# Patient Record
Sex: Male | Born: 1945 | Race: White | Hispanic: No
Health system: Northeastern US, Academic
[De-identification: ages and names within clinical notes are randomized; demographics above are authoritative.]

---

## 2019-05-29 ENCOUNTER — Inpatient Hospital Stay: Admit: 2019-05-29 | Discharge: 2019-05-29 | Payer: BLUE CROSS/BLUE SHIELD

## 2019-05-29 ENCOUNTER — Encounter: Admit: 2019-05-29 | Payer: PRIVATE HEALTH INSURANCE

## 2019-05-29 ENCOUNTER — Ambulatory Visit: Admit: 2019-05-29 | Payer: BLUE CROSS/BLUE SHIELD

## 2019-05-29 DIAGNOSIS — E7800 Pure hypercholesterolemia, unspecified: Secondary | ICD-10-CM

## 2019-05-29 DIAGNOSIS — E1142 Type 2 diabetes mellitus with diabetic polyneuropathy: Secondary | ICD-10-CM

## 2019-05-29 DIAGNOSIS — Z8051 Family history of malignant neoplasm of kidney: Secondary | ICD-10-CM

## 2019-05-29 DIAGNOSIS — C773 Secondary and unspecified malignant neoplasm of axilla and upper limb lymph nodes: Secondary | ICD-10-CM

## 2019-05-29 DIAGNOSIS — Z807 Family history of other malignant neoplasms of lymphoid, hematopoietic and related tissues: Secondary | ICD-10-CM

## 2019-05-29 DIAGNOSIS — Z7984 Long term (current) use of oral hypoglycemic drugs: Secondary | ICD-10-CM

## 2019-05-29 DIAGNOSIS — Z8052 Family history of malignant neoplasm of bladder: Secondary | ICD-10-CM

## 2019-05-29 DIAGNOSIS — Z20822 Contact with and (suspected) exposure to covid-19: Secondary | ICD-10-CM

## 2019-05-29 DIAGNOSIS — E8881 Metabolic syndrome: Secondary | ICD-10-CM

## 2019-05-29 DIAGNOSIS — Z79899 Other long term (current) drug therapy: Secondary | ICD-10-CM

## 2019-05-29 DIAGNOSIS — C4359 Malignant melanoma of other part of trunk: Secondary | ICD-10-CM

## 2019-05-29 DIAGNOSIS — I890 Lymphedema, not elsewhere classified: Secondary | ICD-10-CM

## 2019-05-29 DIAGNOSIS — Z5112 Encounter for antineoplastic immunotherapy: Secondary | ICD-10-CM

## 2019-05-29 DIAGNOSIS — Z7982 Long term (current) use of aspirin: Secondary | ICD-10-CM

## 2019-05-29 DIAGNOSIS — Z955 Presence of coronary angioplasty implant and graft: Secondary | ICD-10-CM

## 2019-05-29 DIAGNOSIS — Z86010 Personal history of colonic polyps: Secondary | ICD-10-CM

## 2019-05-29 DIAGNOSIS — E119 Type 2 diabetes mellitus without complications: Secondary | ICD-10-CM

## 2019-05-29 DIAGNOSIS — E785 Hyperlipidemia, unspecified: Secondary | ICD-10-CM

## 2019-05-29 DIAGNOSIS — C439 Malignant melanoma of skin, unspecified: Secondary | ICD-10-CM

## 2019-05-29 DIAGNOSIS — Z006 Encounter for examination for normal comparison and control in clinical research program: Secondary | ICD-10-CM

## 2019-05-29 LAB — COMPREHENSIVE METABOLIC PANEL
BKR A/G RATIO: 1 (ref 1.0–2.2)
BKR ALANINE AMINOTRANSFERASE (ALT): 33 U/L (ref 9–59)
BKR ALBUMIN: 4 g/dL (ref 3.6–4.9)
BKR ALKALINE PHOSPHATASE: 96 U/L (ref 9–122)
BKR ANION GAP: 11 (ref 7–17)
BKR ASPARTATE AMINOTRANSFERASE (AST): 32 U/L (ref 10–35)
BKR AST/ALT RATIO: 1 (ref 0.3–4.9)
BKR BILIRUBIN TOTAL: 0.4 mg/dL (ref <=1.2)
BKR BLOOD UREA NITROGEN: 26 mg/dL — ABNORMAL HIGH (ref 8–23)
BKR BUN / CREAT RATIO: 32.1 — ABNORMAL HIGH (ref 8.0–23.0)
BKR CALCIUM: 9.5 mg/dL (ref 8.8–10.2)
BKR CHLORIDE: 98 mmol/L (ref 98–107)
BKR CO2: 28 mmol/L (ref 20–30)
BKR CREATININE: 0.81 mg/dL (ref 0.40–1.30)
BKR EGFR (AFR AMER): >60 mL/min/{1.73_m2} (ref >60)
BKR EGFR (NON AFRICAN AMERICAN): >60 mL/min/{1.73_m2} (ref >60)
BKR GLOBULIN: 3.9 g/dL — ABNORMAL HIGH (ref 2.3–3.5)
BKR GLUCOSE: 238 mg/dL — ABNORMAL HIGH (ref 70–100)
BKR POTASSIUM: 4.7 mmol/L (ref 3.3–5.1)
BKR PROTEIN TOTAL: 7.9 g/dL (ref 6.6–8.7)
BKR SODIUM: 137 mmol/L (ref 136–144)

## 2019-05-29 LAB — CBC WITH AUTO DIFFERENTIAL
BKR WAM ABSOLUTE IMMATURE GRANULOCYTES: 0 x 1000/ÂµL (ref 0.0–0.3)
BKR WAM ABSOLUTE LYMPHOCYTE COUNT: 1.6 x 1000/ÂµL (ref 1.0–4.0)
BKR WAM ABSOLUTE NRBC: 0 x 1000/ÂµL (ref 0.0–0.0)
BKR WAM ANALYZER ANC: 5.2 x 1000/ÂµL (ref 1.0–11.0)
BKR WAM BASOPHIL ABSOLUTE COUNT: 0.1 x 1000/ÂµL — ABNORMAL HIGH (ref 0.0–0.0)
BKR WAM BASOPHILS: 0.7 % (ref 0.0–4.0)
BKR WAM EOSINOPHIL ABSOLUTE COUNT: 0.2 x 1000/ÂµL (ref 0.0–1.0)
BKR WAM EOSINOPHILS: 2.6 % (ref 0.0–7.0)
BKR WAM HEMATOCRIT: 42.8 % (ref 37.0–52.0)
BKR WAM HEMOGLOBIN: 14.1 g/dL (ref 12.0–18.0)
BKR WAM IMMATURE GRANULOCYTES: 0.4 % (ref 0.0–3.0)
BKR WAM LYMPHOCYTES: 20.4 % (ref 8.0–49.0)
BKR WAM MCH (PG): 29.1 pg (ref 27.0–31.0)
BKR WAM MCHC: 32.9 g/dL (ref 31.0–36.0)
BKR WAM MCV: 88.4 fL (ref 78.0–94.0)
BKR WAM MONOCYTE ABSOLUTE COUNT: 0.6 x 1000/ÂµL (ref 0.0–2.0)
BKR WAM MONOCYTES: 7.5 % (ref 4.0–15.0)
BKR WAM MPV: 10.7 fL (ref 6.0–11.0)
BKR WAM NEUTROPHILS: 68.4 % (ref 37.0–84.0)
BKR WAM NUCLEATED RED BLOOD CELLS: 0 % (ref 0.0–1.0)
BKR WAM PLATELETS: 267 x1000/ÂµL (ref 140–440)
BKR WAM RDW-CV: 13.5 % (ref 11.5–14.5)
BKR WAM RED BLOOD CELL COUNT: 4.8 M/ÂµL (ref 3.8–5.9)
BKR WAM WHITE BLOOD CELL COUNT: 7.6 x1000/ÂµL (ref 4.0–10.0)

## 2019-05-29 LAB — PHOSPHORUS     (BH GH L LMW YH): BKR PHOSPHORUS: 3.2 mg/dL (ref 2.2–4.5)

## 2019-05-29 LAB — PT/INR AND PTT (BH GH L LMW YH)
BKR INR: 1 (ref 0.92–1.08)
BKR PARTIAL THROMBOPLASTIN TIME: 25.1 s (ref 22.2–29.6)
BKR PROTHROMBIN TIME: 10.7 s (ref 9.9–11.5)

## 2019-05-29 LAB — MAGNESIUM: BKR MAGNESIUM: 1.7 mg/dL (ref 1.7–2.4)

## 2019-05-29 LAB — LACTATE DEHYDROGENASE: BKR LACTATE DEHYDROGENASE: 205 U/L (ref 122–241)

## 2019-05-29 LAB — CK     (BH GH L LMW YH): BKR CREATINE KINASE TOTAL: 137 U/L (ref 11–204)

## 2019-05-29 MED ORDER — DIPHENHYDRAMINE 50 MG/ML INJECTION SOLUTION
50 mg/mL | Freq: Once | INTRAVENOUS | Status: DC | PRN
Start: 2019-05-29 — End: 2019-06-03

## 2019-05-29 MED ORDER — HYDROCORTISONE SODIUM SUCCINATE 100 MG SOLUTION FOR INJECTION
100 mg | Freq: Once | INTRAVENOUS | Status: DC | PRN
Start: 2019-05-29 — End: 2019-06-03

## 2019-05-29 MED ORDER — HIC 2000025461 PEMBROLIZUMAB (MK-3475)
Freq: Once | INTRAVENOUS | Status: CP
Start: 2019-05-29 — End: ?
  Administered 2019-05-29: 18:00:00 100.000 mL/h via INTRAVENOUS

## 2019-05-29 MED ORDER — SODIUM CHLORIDE 0.9 % BOLUS (NEW BAG)
0.9 % | Freq: Once | INTRAVENOUS | Status: DC | PRN
Start: 2019-05-29 — End: 2019-06-03

## 2019-05-29 MED ORDER — FAMOTIDINE 4 MG/ML IN STERILE WATER (ADULT)
Freq: Once | INTRAVENOUS | Status: DC | PRN
Start: 2019-05-29 — End: 2019-06-03

## 2019-05-29 MED ORDER — SODIUM CHLORIDE 0.9 % INTRAVENOUS SOLUTION
INTRAVENOUS | Status: DC | PRN
Start: 2019-05-29 — End: 2019-06-03

## 2019-05-29 MED ORDER — EPINEPHRINE 0.3 MG/0.3 ML INJECTION, AUTO-INJECTOR
0.3 mg/ mL | INTRAMUSCULAR | Status: DC | PRN
Start: 2019-05-29 — End: 2019-06-03

## 2019-05-29 MED ORDER — MEPERIDINE 25 MG/2.5 ML IN 0.9% SODIUM CHLORIDE
Freq: Once | INTRAVENOUS | Status: DC | PRN
Start: 2019-05-29 — End: 2019-06-03

## 2019-05-29 NOTE — Progress Notes
HIC [id number] + Pembrolizumab- Pt arrived to clinic for treatment with C2D1 as scheduled.  PIV placed, safety labs and research PK drawn per protocol.  Pt had MD visit, labs reviewed and tox assessment complete by [facility]- cleared for treatment today.  Nursing assessment complete upon arrival to treatment area, see flowsheet for details.  Pt tolerated pembro infusion as ordered from 1320 to 1350, line flushed with NS per protocol.  PIV flushed and d/c'd, dry dressing applied.  Discharged to home in stable condition, in care of self.  To rtc 3/4 for next treatment as scheduled.

## 2019-05-30 NOTE — Progress Notes
NAME:  Jason Rowland (01/13/1946)AGE: 74 y.o. MRN:  [id number] PROVIDER: Drema Halon, MD, PhDDATE OF SERVICE: 05/29/2019 [facility] [place]  REASON FOR VISIT:  C2D1 of HIC [id number] DX:  Invasive melanoma right mid back, stage IIIC (pT4a N3 M0); 30 mm with microsatellitosis, mitotic rate 2 per mm2.0% tumor and immune cell PD-L1 staining50-gene panel:  DNA VARIANT DETECTED ? ? 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 ? 74% TREATMENT HX:- Adjuvant Moderna clinical trial (?A Phase 2 Randomized Study of Adjuvant Immunotherapy with the Personalized Cancer Vaccine [phone] and Pembrolizumab Versus Pembrolizumab Alone After Complete Resection of High-Risk Melanoma? Protocol [id number], HIC 62952) C1D1 05/08/19.  Randomized to the pembrolizumab and vaccine arm. Onc Hx: Jason Rowland (prefers to be called Jason Rowland) is a 74 y.o. male who works for [employer] (will be retiring at the end of 02/2019 after working there for 50 years) with a PMH of DM2 complicated by some peripheral neuropathy, HLD, and AKs who is referred by Joanie Coddington, his dermatologist for a right back metastatic melanoma.  He reports it appeared approx 10 months prior; his PCP originally thought it was a pimple.  Dr. Lorn Junes at [facility] later evaluated it and also thought it was a pimple and injected it with intralesional kenalog x 2 and oral cephalexin without improvement.  The lesion increased in size and burned whenever he leaned against anything.  Denies night sweats and weight loss.  Lucie Leather did a I&D on 01/07/19  with only slight bloody discharge, so a punch biopsy was obtain with path showing metastatic melanoma.  On 01/20/19, additional biopsies were done to try to evaluate a primary site, but we do not have this tissue.  Per patient report, these biopsies were negative. MRI brain 01/30/19 without intracranial disease.  PET done 02/07/19 showing preepiglottic FDG activity s/p ENT evaluation which was negative for any visible malignancy.  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 LDH elevated to 251 at diagnosis. His case was presented at the [facility] Melanoma tumor board on 02/13/19 and consensus was to ask ENT for random biopsies of the BOT and pre-epiglottic PET-avid areas, as we do not want to miss a head and neck cancer but otherwise not felt to be related to his melanoma.  The cervical LN were not thought to be related.  He should also have gastroenterology evaluation for a PET-avid stranding lesion in the transverse colon.  Dr. Landis Gandy (ENT) performed BOT and vallecula random biopsies 02/20/19 which only showed chronic inflammation and no malignancy.  He had a colonoscopy by Dr. Linton Flemings on 02/27/19 showing only a tubular adenoma in the sigmoid colon.  Overall, it was felt that his right mid back lesion is the site of his primary disease, and the metastatic melanoma pathology interpretation may have been complicated due to repeat I&Ds and steroid injections that may have altered the architecture of the primary lesion.  In the absence of any confirmed metastatic disease, he underwent WLE and LND with Dr. Duanne Moron on 03/03/19.  Final path showed a 30 mm thick melanoma, 2 mitoses/mm2, non-brisk TILs, no tumor regression, LVI+, microsatellites+, negative margins, and 3/35 lymph nodes (largest LN 4.5 x 3 cm) were positive for melanoma with presence of lymphatic invasion without extranodal extension.He uses a cane sometimes as he has some balance issues due to neuropathy in his feet.  Otherwise, he is fully functional, active, and independent in ADLs/iADLs.  No hx of autoimmune disease.03/27/19 signed consent for the Moderna trial;  randomized to pembro and vaccine arm.04/24/19 noted to have wound dehiscence, non-infected, healing by secondary intent.  Re-instated VNA services to assist with dressing changes and packing.Interval OZ:HYQM returns for follow-up today and C2 of his clinical trial.  He will get pembrolizumab alone again today.  Plan is to add the vaccine with C3 of treatment if it is ready.  His wound is healing well with secondary intent.  Good granulation tissue with clean wound edges.  No concerns for infection.  VNA is coming every other day to help with dressing changes, as he cannot do them himself at it's on his back.  Wound is smaller at this visit.  Fasting AM glucose has been averaging around 240.  No changes have been made in Toujeo dose (still on 80 units nightly); glucose as been as low as 166.  No episodes of hypoglycemia.He continues to get most of his exercise [place].  Denies fevers or chills.  He overall feels well. ROM on the right arm continues to improve.  Right arm swelling from lymphedema is also stable.  He has plans to visit his daughter in [place] on 06/08/19 - 06/12/19.  He also had questions regarding COVID vaccination.  He agreed to get COVID testing a few days after his return but before he next treatment on 06/17/19 at [facility].He reports have 3 solid BMs yesterday followed by 1 episode of loose BM not associated with blood or abdominal cramping.  No further episodes of diarrhea since.  Baseline BM is 1 per day.ECOG:  0Pain:  3 (chronic neuropathy pain - stable and at baseline)Review of Systems:No fevers, chills, night sweats, lightheadedness, HA, vision changes, CP, palpitations, SOB, DOE, cough, wheezing, abd pain, N/V, constipation, blood in the urine or stools, dysuria.  Diabetic neuropathy in the hands and feet stable.  He reports having intermittent loose stools, chronic, without blood depending on his diet (unchanged and often isolated to a specific restaurant he likes to visit with his buddies).PAST MEDICAL HISTORY:Past Medical History: Diagnosis Date ? Diabetes mellitus (HC Code)  ? High cholesterol  ? Metastatic melanoma (HC Code) 01/07/2019 SURGICAL HISTORY:Past Surgical History: Procedure Laterality Date ? COLONOSCOPY   ? CORONARY ANGIOPLASTY WITH STENT PLACEMENT Bilateral  ? laryngoscopy with biopsy  02/20/2019 ? ROTATOR CUFF REPAIR Left 1999 ? TONSILLECTOMY    age 55 ? TOTAL HIP ARTHROPLASTY Right 2002 FAMILY HISTORY:Family History Problem Relation Age of Onset ? Heart disease Mother  ? Bladder cancer Father 71 ? Kidney cancer Sister  ? Leukemia Brother 30 ? No Known Problems Daughter  ? No Known Problems Son  SOCIAL HISTORY:Social History Socioeconomic History ? Marital status: Divorced   Spouse name: Not on file ? Number of children: Not on file ? Years of education: Not on file ? Highest education level: Not on file Occupational History ? Not on file Social Needs ? Financial resource strain: Not on file ? Food insecurity   Worry: Not on file   Inability: Not on file ? Transportation needs   Medical: Not on file   Non-medical: Not on file Tobacco Use ? Smoking status: Never Smoker ? Smokeless tobacco: Never Used Substance and Sexual Activity ? Alcohol use: Not Currently ? Drug use: No ? Sexual activity: Not on file Lifestyle ? Physical activity   Days per week: Not on file   Minutes per session: Not on file ? Stress: Not on file Relationships ? Social Manufacturing systems engineer on phone: Not on file   Gets together:  Not on file   Attends religious service: Not on file   Active member of club or organization: Not on file   Attends meetings of clubs or organizations: Not on file   Relationship status: Not on file ? Intimate partner violence   Fear of current or ex partner: Not on file   Emotionally abused: Not on file   Physically abused: Not on file   Forced sexual activity: Not on file Other Topics Concern ? Not on file Social History Narrative  Divorced, works for [employer] in [place] will be retiring 03/17/19. Has 1 son in [place] and 1 daughter in [place]. Llives in [place]  ALLERGIES:No Known AllergiesMEDICATIONS:Current Outpatient Medications Medication Sig ? acetaminophen (TYLENOL) 325 mg tablet Take 3 tablets (975 mg total) by mouth every 6 (six) hours as needed (mild to moderate pain). ? ALPRAZolam (XANAX) 0.5 mg tablet 0.5 mg.  ? aspirin 81 MG EC tablet Take 81 mg by mouth nightly.  ? bedside commode 3 in 1 commode (Patient not taking: Reported on 03/07/2019) ? docusate sodium (COLACE) 250 mg capsule Take 1 capsule (250 mg total) by mouth 2 (two) times daily as needed for constipation. ? FREESTYLE LIBRE 14 DAY sensor kit 1 each by Other route every 14 (fourteen) days. ? glipiZIDE (GLUCOTROL XL) 10 MG 24 hr tablet Take 10 mg by mouth nightly.  ? ibuprofen (ADVIL,MOTRIN) 200 mg tablet Take 2 tablets (400 mg total) by mouth every 6 (six) hours as needed (alternate with tylenol for mild to moderate pain). ? NAFTIN 2 % gel Apply 1 Application topically. ? olopatadine (PATANOL) 0.1 % ophthalmic solution as needed (takes for eye irritation as needed).  ? simvastatin (ZOCOR) 20 MG tablet Take 20 mg by mouth nightly.  ? TOUJEO SOLOSTAR U-300 300 unit/mL (1.5 mL) pen Inject 30 Units under the skin nightly. ? walker Misc Use as directed. ? zolpidem (AMBIEN) 10 mg tablet nightly.. No current facility-administered medications for this visit.  Facility-Administered Medications Ordered in Other Visits Medication ? diphenhydrAMINE (BENADRYL) injection 50 mg ? EPINEPHrine (EPI-PEN) auto-injector 0.3 mg ? EPINEPHrine (EPI-PEN) auto-injector 0.3 mg ? famotidine (PF) (PEPCID) 20 mg in water for injection, sterile 5 mL Injection ? hydrocortisone sodium succinate (Solu-CORTEF) injection 100 mg ? meperidine PF (DEMEROL) 25 mg in sodium chloride 0.9% PF 2.5 mL Injection ? sodium chloride 0.9 % (new bag) bolus 500 mL ? sodium chloride 0.9% infusion VITALS: BP 139/89 (Site: r a)  - Pulse 86  - Temp 98 ?F (36.7 ?C) (Temporal)  - Resp 14  - Wt 125.4 kg  - SpO2 100%  - BMI 39.50 kg/m?  PHYSICAL EXAM:Gen:  NAD, pleasant HEENT:  EOMI, PEARLLN:  No cervical, supraclavicular, or axillary LAD.  CV:  RRR, no m/r/gPulm:  CTA b/lAbd:  Soft, non-tender, no guarding or rebound, no organomegaly, no ascitesExt:  WWP, 2+ symmetric edema in the ankles (chronicand unchanged).  Lymphedema present in the right arm, unchanged.Neuro:   Grossly intactPsych:  Normal mood and affectSkin:  Wound on the right upper back 1.5 in long along the scar with healthy, pink granulation tissue underneath; clean wound edges, packed with dressing overlying the site.  Seroma improved along the proximal end of the scar.  No concerns for infection.  Right axillary scar well healed without concerning nodularity.  LABS:Results for orders placed or performed during the hospital encounter of 05/29/19 MAGNESIUM Result Value Ref Range  Magnesium 1.7 1.7 - 2.4 mg/dL Phosphorus     (BH GH L LMW YH)  Result Value Ref Range  Phosphorus 3.2 2.2 - 4.5 mg/dL Lactate dehydrogenase Result Value Ref Range  LD 205 122 - 241 U/L CK     (BH GH L LMW YH) Result Value Ref Range  Total CK 137 11 - 204 U/L PT/INR and PTT     (BH GH L YH) Result Value Ref Range  Prothrombin Time 10.7 9.9 - 11.5 seconds  INR 1.00 0.92 - 1.08  PTT 25.1 22.2 - 29.6 seconds CBC auto differential Result Value Ref Range  WBC 7.6 4.0 - 10.0 x1000/?L  RBC 4.8 3.8 - 5.9 M/?L  Hemoglobin 14.1 12.0 - 18.0 g/dL  Hematocrit 91.4 78.2 - 52.0 %  MCV 88.4 78.0 - 94.0 fL  MCHC 32.9 31.0 - 36.0 g/dL  RDW-CV 95.6 21.3 - 08.6 %  Platelets 267 140 - 440 x1000/?L  MPV 10.7 6.0 - 11.0 fL  ANC (Abs Neutrophil Count) 5.2 1.0 - 11.0 x 1000/?L  Neutrophils 68.4 37.0 - 84.0 %  Lymphocytes 20.4 8.0 - 49.0 %  Absolute Lymphocyte Count 1.6 1.0 - 4.0 x 1000/?L  Monocytes 7.5 4.0 - 15.0 % Monocyte Absolute Count 0.6 0.0 - 2.0 x 1000/?L  Eosinophils 2.6 0.0 - 7.0 %  Eosinophil Absolute Count 0.2 0.0 - 1.0 x 1000/?L  Basophil 0.7 0.0 - 4.0 %  Basophil Absolute Count 0.1 (H) 0.0 - 0.0 x 1000/?L  Immature Granulocytes 0.4 0.0 - 3.0 %  Absolute Immature Granulocyte Count 0.0 0.0 - 0.3 x 1000/?L  nRBC 0.0 0.0 - 1.0 %  Absolute nRBC 0.0 0.0 - 0.0 x 1000/?L  MCH 29.1 27.0 - 31.0 pg Comprehensive metabolic panel Result Value Ref Range  Sodium 137 136 - 144 mmol/L  Potassium 4.7 3.3 - 5.1 mmol/L  Chloride 98 98 - 107 mmol/L  CO2 28 20 - 30 mmol/L  Anion Gap 11 7 - 17  Glucose 238 (H) 70 - 100 mg/dL  BUN 26 (H) 8 - 23 mg/dL  Creatinine 5.78 4.69 - 1.30 mg/dL  Calcium 9.5 8.8 - 62.9 mg/dL  BUN/Creatinine Ratio 52.8 (H) 8.0 - 23.0  Total Protein 7.9 6.6 - 8.7 g/dL  Albumin 4.0 3.6 - 4.9 g/dL  Total Bilirubin 0.4 <=4.1 mg/dL  Alkaline Phosphatase 96 9 - 122 U/L  Alanine Aminotransferase (ALT) 33 9 - 59 U/L  Aspartate Aminotransferase (AST) 32 10 - 35 U/L  Globulin 3.9 (H) 2.3 - 3.5 g/dL  A/G Ratio 1.0 1.0 - 2.2  AST/ALT Ratio 1.0 0.3 - 4.9  eGFR (Afr Amer) >60 >60 mL/min/1.13m2  eGFR (NON African-American) >60 >60 mL/min/1.63m2 IMAGING/PATH:MRI Brain and [place] CAP 04/17/19:  NEDPET 02/07/19:Head And Neck: Base of tongue and preepiglottic FDG activity is nonspecific (SUV max 6.6).Mildly FDG avid cervical lymph nodes are nonspecific, for example:*  Left level 2 ([place] image 874, 1.4 x 0.8 cm, SUV max 2.6)*  Right level 2 ([place] image 878, 1.4 x 1 cm, SUV max 2.1)CHEST:Hypermetabolic mass overlying the right posterior 10th rib is consistent with malignancy, and involves the skin, subcutaneous fat, extending to the latissimus dorsi muscle (SUV max 15, 6.3 x 3 x 5.9 cm).Two hypermetabolic right axillary lymph nodes are highly suspicious for metastases (up to SUV max 8.3, 2.4 x 1.7 cm). Multiple additional smaller right subpectoral lymph nodes are minimally FDG avid, and are indeterminate (SUV max 1.2, subcentimeter).Azygos pulmonary fissure is incidentally noted. No suspicious pulmonary nodule is identified, although pulmonary detail is partially obscured by breathing motion. Coronary arteries and the aortic valve are calcified.  The heart is on the upper bound of normal size. Nonspecific pattern of myocardial FDG activity. No mediastinal lymphadenopathy is identified.Abdomen/Pelvis: Diffuse hepatic and splenic heterogeneity most likely represents artifactual image noise, however this could conceal any small lesions.Multiple colonic diverticuli show no evidence of diverticulitis. Diffuse patchy colonic FDG activity is nonspecific, commonly inflammatory or related to a pharmacologic effect, however this could conceal any small lesions (SUV max 9 the sigmoid). Left renal cortical defect associated with a punctuate calcification likely represents scarring. Right renal calyceal calcifications likely represent nonobstructing stones (up to 0.5 cm).Normal appearance of the gallbladder, spleen, stomach, pancreas, adrenal glands, small bowel, decompressed urinary bladder.Streak artifacts arising from metallic right hip prostheses obscure nearby structures.Small retroperitoneal lymph nodes are not FDG avid, although below resolution of PET. Otherwise, no retroperitoneal, mesenteric, or pelvic lymphadenopathy is identified. Inguinal lymph nodes are nonspecific (SUV max 1.5), commonly inflammatory.MUSCULOSKELETAL:Heterogeneous marrow FDG activity is likely due to a combination of artifactual image noise, and marrow activation, however this reduces sensitivity for any small lesions (SUV max 4.6 in the sacrum). Right knee FDG activity associated with cruciate ligaments and synovium is likely degenerative or posttraumatic (SUV max 5), with small knee effusion (SUV max 3.3).IMPRESSION:1.  The accession number [id number] does not have any associated images in PACS at the time of dictation.  PET-[place] images associated with the accession number [id number] / [id number] were interpreted in this report.2.  Hypermetabolic mass overlying the right posterior 10th rib is consistent with malignancy, and involves the skin, subcutaneous fat, extending to the latissimus dorsi muscle.3.  Right axillary nodal metastases.4.  Multiple small right subpectoral lymph nodes are indeterminate; metastases are not excluded.5.  Base of tongue and preepiglottic FDG activity is nonspecific, with slight asymmetric fullness on the left. Direct visualization is recommended to exclude primary neoplasm.6.  Minimally FDG avid bilateral level 2 cervical lymph nodes are nonspecific, possibly inflammatory.MRI brain 01/30/19:There is no intracranial hemorrhage or major vascular distribution infarct.No abnormal enhancement is seen.There is no mass effect, edema, or midline shift.The ventricles are symmetric and normal in size. Several scattered foci of T2/FLAIR hyperintense signal are noted in the periventricular and subcortical white matter, likely sequela of chronic small vessel ischemic disease. No extra-axial collection is seen.Prior lens replacements noted bilaterally.The paranasal sinuses are clear. Small right mastoid effusion noted.IMPRESSION:No evidence of intracranial metastatic disease.WLE and SLN 03/03/19:LYMPH NODES, RIGHT AXILLARY LEVELS 1, 2 AND 3, LYMPH NODE DISSECTION: ?  ? ? - ?THREE OF THIRTY-FIVE LYMPH NODES, POSITIVE FOR MELANOMA (3 OF 35) WITH LARGEST INVOLVED LYMPH NODE MEASURING 4.5 X 3 CM ? ? ?- LYMPHATIC INVASION IDENTIFIED ? ? ?- NO EXTRANODAL EXTENSION IDENTIFIED 1. ?SKIN, BACK, RIGHT UPPER, EXCISION: ? ? ? ?- MALIGNANT MELANOMA, SEE NOTE AND SYNOPTIC SUMMARY Note: Sections show a tumor composed of S100 positive epithelioid cells within the dermis and subcutaneous tissue. Cytokeratin AE1/AE3 and desmin are negative. A junctional component is not identified. The findings would be compatible with a primary dermal melanoma or a metastatic lesion. Clinical correlation is suggested. 2. ?SKIN, BACK, EXCISION: ? ? ? ?- BENIGN FIBROADIPOSE TISSUE AND SKELETAL MUSCLE SYNOPTIC SUMMARY MELANOMA OF THE SKIN Tumor Site: ? ? Back Laterality: ? ? Right Procedure: ? ? Primary excision Maximum Tumor Thickness (Depth): ? ? 30 mm Ulceration: ? ? Not identified Mitotic Rate (Mitoses/mm2): ? ? 2 Anatomic Level: ? ? V (Melanoma invades subcutis) Growth Phase: ? ? Vertical Histologic Type: ? ? Melanoma, NOS Tumor-Infiltrating Lymphocytes: ? ? Present, non-brisk Tumor Regression: ? ? Not  identifed Lymphovascular Invasion: ? ? Present Microsatellitosis: ? ? Present Margins ? ? ?Peripheral Margins: ? ? Uninvolved Deep Margin: ? ? Uninvolved Stage (AJCC 8th Ed): ? ? pT4a Nx Base of the tongue and vallecula random biopsies 02/20/19: 1. ?TONGUE, LEFT, BASE, BIOPSY: ? ? ? ?- LYMPHOID TISSUE HYPERPLASIA ? ? ?- NEGATIVE FOR MALIGNANCY 2. ?THROAT, VALLECULA, BIOPSY: ? ?  ? - LYMPHOID TISSUE HYPERPLASIA AND FOCAL CHRONIC INFLAMMATION. SEE NOTE. ? ? ?- NEGATIVE FOR MALIGNANCY ? ? ? ? ? Note: Immunostain of Sox-10 to investigate focal pigment is negative, supporting above interpretation. 3. ?THROAT, LEFT VALLECULA, BIOPSY: ? ? ? ?- LYMPHOID TISSUE HYPERPLASIA ? ? ?- NEGATIVE FOR MALIGNANCY 4. ?THROAT, RIGHT VALLECULA, BIOPSY: ? ? ? ? ? ? - LYMPHOID TISSUE HYPERPLASIA AND FOCAL CHRONIC INFLAMMATION ? ? ?- NEGATIVE FOR MALIGNANCY 5. ?TONGUE, RIGHT BASE, BIOPSY: ? ? ? ? ? ? - LYMPHOID TISSUE HYPERPLASIA AND FOCAL CHRONIC INFLAMMATION ? ? ?- NEGATIVE FOR MALIGNANCY Pathology (reviewed at [facility]), collected 01/07/19:DIAGNOSIS: ? 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 Clinical pathological correlation is recommended. MICROSCOPIC DESCRIPTION: There are atypical cells in a nodule in the dermis. The cells are positive with SOX-10 and by report are negative with LCA, Kappa, Lambda, and a cytokeratin. The cells are also positive with S-100 by report.ASSESSMENT/PLAN:[name] is a 74 y.o. man recently retired from the [employer] at the end of 02/2019 with an NRAS mutated melanoma detected in the right mid-back.  Initially path read as metastatic melanoma, but further workup confirms that this is the primary site, not a metastatic lesion.  Initial biopsy was likely altered due to repeat I&Ds of the area.  He had a FBSE with his dermatologist and no additional primary lesions were identified.  He is otherwise in good health except for metabolic syndrome and diabetes-associated neuropathy.  PET 02/07/19 showed the main lesion on the right upper back extending to the latissimus dorsi muscle with involved right axillary nodes and nonspecific avidity in the BOT and preepiglottis for which direct visual evaluation was recommended and done 02/11/19 and random biopsies 02/20/19 without concerns for malignancy. Colonoscopy was also done to evaluate stranding in the transverse colon, which was negative for malignancy on exam.  Now s/p WLE and LND with Dr. Duanne Moron on 03/03/19 showing a stage IIIC melanoma that was 30 mm thick, positive for LVI and microsatellites with 3/25 positive LNs.  Given the thickness of his primary melanoma and microsatellitosis, we felt that his melanoma-specific survival is closer to a stage IIID:  60% over 10 years with stage IIIC disease versus 24% with stage IIID.  He does not have a BRAF mutation, so SOC options are immune therapy at this point versus the Moderna clinical trial (?A Phase 2 Randomized Study of Adjuvant Immunotherapy with the Personalized Cancer Vaccine [phone] and Pembrolizumab Versus Pembrolizumab Alone After Complete Resection of High-Risk Melanoma?) vs active surveillance (for which he would be extremely high risk for relapsing).  He opted for the Moderna trial and signed consent on 03/27/19; tissue passed QC inspection.  Jason Rowland is randomized to [facility] with vaccine arm of the trial.  His back post-op wound is healing nicely but may take another month to fully close.  In the meantime, he is working on tightening his glucose control to aid in healing and also given the stable but elevated proteinuria in his urine, likely related  to diabetes.  He receives ongoing VNA support to assist with dressing changes on his back.  He has stable right arm lymphedema but declines any lymphedema therapy or sleeves.  Other chronic medical conditions stable.  - labs reviewed today and stable per his baseline.  LDH wnl.  Ok for treatment C2 today.  Will get pembrolizumab alone.  Plan for pembro with vaccine with C3.- resigned amended consent today per protocol.- encouraged to get the COVID vaccine, as his age group is eligible as of today in [place].  Ideally should receive the vaccine in the middle between cycles.  Provided info on scheduling via MyChart and the [facility] website.- plan for COVID testing 3/2 (48 hours prior) to next appt for tx given plans for travel to [facility].- ongoing wound care and dressing changes with home VNA- reviewed self exams and demonstrated knowledge on when to call if new concerning lesions arise.- routine f/u with dermatologist every 4-6 months.  Last seen 04/2019.- RTC in 3 weeks Thursday AM with me on NP8 per trial schedule; instructed to call if any issues or questions in the interim35 min were spent in direct patient contact.  He has our clinic contact information and instructed to call if questions.  Drema Halon, MD, PhDMedical Oncology

## 2019-05-30 NOTE — Progress Notes
?HIC # [id number]?Cycle 2, Day 1?Patient seen and examined by Drema Halon, MD.Labs and toxicity assessment reviewed by Drema Halon, MD.  All labs are deemed NCS today.Concomitant medications reviewed and updated.Approved for treatment today per  Drema Halon, MD.The calendar was reviewed with the patient, and they will RTC per protocol.?ECOG PS = 0??Concomitant medications:?MEDICATION SINGLE DOSE UNITS ROUTE FREQUECY START DATE STOP DATE INDICATION Aspirin 81 mg PO QD 2010 ongoing PPX CVD Simvastatin 20 Mg PO QD 2012 ongoing Hypercholesterolemia ?Ambien 10 mg PO QD 2016 ongoing insomnia ?Glucoside 10 mg PO QD 2014 ongoing diabetes ?Toujeo Solostar ?80 Units SQ QD ?2016 ?ongoing diabetes ? ? ? ? ? ? ? ? ? ? ? ? ? ? ? ? ? ? ? ? ? ? ? ? ? ? ? ? ? ? ? ? ? ? ? ? ? ? ? ? ? ? ? ? ? ? ? ? ??Medical History:?? ? ? ? ? ? ? ? ? ? ? ? ? ? ? ? ? ? ? ? ? ? ? ? ? ? ? ? PATIENT NAME:?Jason Rowland??STUDY ID: ?[id number] ? ? 0=NA ?????????????????????1= Unrelated ????????2= Unlikely 3=Possible ????????4= Probable ??????????5. Definite (Circle One) 0= No Action Taken ????????????????????????????????????????1= Con Med ???????????????????????????????????????????????????????????2=Dose Modified ???????????????????????????????????????3=Dose Delayed ????????????????????????????????????????????????????????4= Patient Hospitalized ?????????????????????????????????????5= Patient Taken Off Study ???????????????????6=Non Drug Therapy ???????????????????????????????????????7=Other (Specify) 1=Recovered ??????????????????????2=Still under txmt/observation ?????????????????????????????3=Alive w/Sequelae ????????????????????????????????????????????????????????????????4=Died ????????????????????????5=Resolvd to Baseline ????????????6=Grade Change ? ? ? Medical History ? ? ? ? ? ? ? ? ? ? ? ADVERSE EVENT  Is AE Intermittent? Y/N SAE ????????????Y/N Grade Expect?PEMBRO?Y/N ?Relation to?PEMBRO Expect?mRNA????????????Y/N ?Relation to?mRNA Start Date End Date/ or ?Ongoing Action Taken ??????????????(Select # above) AE OUTCOME ? Hypercholesterolemia N N 1 Med Hx Med Hx Med Hx Med Hx 2012 ongoing 1=?simvastatin 2 ? diabetes N N 1 Med Hx Med Hx Med Hx Med Hx 2010 ongoing 1-?glucoside; toujeo 2 ? Insomnia N N 1 Med Hx Med Hx Med Hx Med Hx 2016 ongoing 1- ambien 2 ? Edema?limbs N N 1 Med Hx? Med Hx Med Hx Med Hx ?03/03/2019 ongoing 0 2 ? Diarrhea? N N 1 Y? 1 ?N/A ?N/A ?05/27/2019 05/27/2019 0 5 ? ? ? ? ? ? ? ? ? ? ? ? ? ? ??Labs:Results for LARRELL, RAPOZO (MRN [id number]) as of 05/29/2019 13:44 Ref. Range 05/29/2019 11:30 Sodium Latest Ref Range: 136 - 144 mmol/L 137 Potassium Latest Ref Range: 3.3 - 5.1 mmol/L 4.7 Chloride Latest Ref Range: 98 - 107 mmol/L 98 CO2 Latest Ref Range: 20 - 30 mmol/L 28 Anion Gap Latest Ref Range: 7 - 17  11 BUN Latest Ref Range: 8 - 23 mg/dL 26 (H) Creatinine Latest Ref Range: 0.40 - 1.30 mg/dL 1.30 BUN/Creatinine Ratio Latest Ref Range: 8.0 - 23.0  32.1 (H) eGFR (NON African-American) Latest Ref Range: >60 mL/min/1.67m2 >60 eGFR (Afr Amer) Latest Ref Range: >60 mL/min/1.33m2 >60 Glucose Latest Ref Range: 70 - 100 mg/dL 865 (H) Calcium Latest Ref Range: 8.8 - 10.2 mg/dL 9.5 Magnesium Latest Ref Range: 1.7 - 2.4 mg/dL 1.7 Phosphorus Latest Ref Range: 2.2 - 4.5 mg/dL 3.2 Total Bilirubin Latest Ref Range: <=1.2 mg/dL 0.4 Alkaline Phosphatase Latest Ref Range: 9 - 122 U/L 96 Alanine Aminotransferase (ALT) Latest Ref Range: 9 - 59 U/L 33 Aspartate Aminotransferase (AST) Latest Ref Range: 10 - 35 U/L 32 AST/ALT Ratio Latest Ref Range: 0.3 - 4.9  1.0 LD Latest Ref Range: 122 - 241 U/L 205 Total Protein Latest Ref Range: 6.6 - 8.7 g/dL 7.9 Albumin Latest Ref Range:  3.6 - 4.9 g/dL 4.0 Globulin Latest Ref Range: 2.3 - 3.5 g/dL 3.9 (H) A/G Ratio Latest Ref Range: 1.0 - 2.2  1.0 Total CK Latest Ref Range: 11 - 204 U/L 137 WBC Latest Ref Range: 4.0 - 10.0 x1000/?L 7.6 RBC Latest Ref Range: 3.8 - 5.9 M/?L 4.8 Hemoglobin Latest Ref Range: 12.0 - 18.0 g/dL 21.3 Hematocrit Latest Ref Range: 37.0 - 52.0 % 42.8 MCV Latest Ref Range: 78.0 - 94.0 fL 88.4 MCH Latest Ref Range: 27.0 - 31.0 pg 29.1 MCHC Latest Ref Range: 31.0 - 36.0 g/dL 08.6 RDW-CV Latest Ref Range: 11.5 - 14.5 % 13.5 nRBC Latest Ref Range: 0.0 - 1.0 % 0.0 Platelets Latest Ref Range: 140 - 440 x1000/?L 267 MPV Latest Ref Range: 6.0 - 11.0 fL 10.7 Neutrophils Latest Ref Range: 37.0 - 84.0 % 68.4 Lymphocytes Latest Ref Range: 8.0 - 49.0 % 20.4 Monocytes Latest Ref Range: 4.0 - 15.0 % 7.5 Eosinophils Latest Ref Range: 0.0 - 7.0 % 2.6 Basophils Latest Ref Range: 0.0 - 4.0 % 0.7 ANC (Abs Neutrophil Count) Latest Ref Range: 1.0 - 11.0 x 1000/?L 5.2 Absolute Lymphocyte Count Latest Ref Range: 1.0 - 4.0 x 1000/?L 1.6 Monocytes (Absolute) Latest Ref Range: 0.0 - 2.0 x 1000/?L 0.6 Eosinophil Absolute Count Latest Ref Range: 0.0 - 1.0 x 1000/?L 0.2 Basophils Absolute Latest Ref Range: 0.0 - 0.0 x 1000/?L 0.1 (H) Immature Granulocytes (Abs) Latest Ref Range: 0.0 - 0.3 x 1000/?L 0.0 nRBC Absolute Latest Ref Range: 0.0 - 0.0 x 1000/?L 0.0 Immature Granulocytes Latest Ref Range: 0.0 - 3.0 % 0.4 Prothrombin Time Latest Ref Range: 9.9 - 11.5 seconds 10.7 INR Latest Ref Range: 0.92 - 1.08  1.00 PTT Latest Ref Range: 22.2 - 29.6 seconds 25.1

## 2019-06-05 ENCOUNTER — Ambulatory Visit: Admit: 2019-06-05 | Payer: PRIVATE HEALTH INSURANCE

## 2019-06-06 ENCOUNTER — Ambulatory Visit: Admit: 2019-06-06 | Payer: PRIVATE HEALTH INSURANCE

## 2019-06-09 ENCOUNTER — Encounter: Admit: 2019-06-09 | Payer: PRIVATE HEALTH INSURANCE

## 2019-06-17 ENCOUNTER — Inpatient Hospital Stay: Admit: 2019-06-17 | Discharge: 2019-06-17 | Payer: BLUE CROSS/BLUE SHIELD

## 2019-06-17 DIAGNOSIS — Z20822 Contact with and (suspected) exposure to covid-19: Secondary | ICD-10-CM

## 2019-06-17 DIAGNOSIS — Z01812 Encounter for preprocedural laboratory examination: Secondary | ICD-10-CM

## 2019-06-17 DIAGNOSIS — C4359 Malignant melanoma of other part of trunk: Secondary | ICD-10-CM

## 2019-06-18 ENCOUNTER — Telehealth: Admit: 2019-06-18 | Payer: PRIVATE HEALTH INSURANCE

## 2019-06-18 LAB — SARS COV-2 (COVID-19) RNA- REFERENCE LAB (BH GH LMW Q YH): BKR SARS-COV-2 RNA (COVID-19) (YH): NEGATIVE

## 2019-06-19 ENCOUNTER — Encounter: Admit: 2019-06-19 | Payer: PRIVATE HEALTH INSURANCE

## 2019-06-19 ENCOUNTER — Inpatient Hospital Stay: Admit: 2019-06-19 | Discharge: 2019-06-19 | Payer: BLUE CROSS/BLUE SHIELD

## 2019-06-19 ENCOUNTER — Ambulatory Visit: Admit: 2019-06-19 | Payer: BLUE CROSS/BLUE SHIELD

## 2019-06-19 DIAGNOSIS — Z955 Presence of coronary angioplasty implant and graft: Secondary | ICD-10-CM

## 2019-06-19 DIAGNOSIS — Z8051 Family history of malignant neoplasm of kidney: Secondary | ICD-10-CM

## 2019-06-19 DIAGNOSIS — C4359 Malignant melanoma of other part of trunk: Secondary | ICD-10-CM

## 2019-06-19 DIAGNOSIS — Z5112 Encounter for antineoplastic immunotherapy: Secondary | ICD-10-CM

## 2019-06-19 DIAGNOSIS — Z7982 Long term (current) use of aspirin: Secondary | ICD-10-CM

## 2019-06-19 DIAGNOSIS — Z85820 Personal history of malignant melanoma of skin: Secondary | ICD-10-CM

## 2019-06-19 DIAGNOSIS — Z96641 Presence of right artificial hip joint: Secondary | ICD-10-CM

## 2019-06-19 DIAGNOSIS — Z006 Encounter for examination for normal comparison and control in clinical research program: Secondary | ICD-10-CM

## 2019-06-19 DIAGNOSIS — E785 Hyperlipidemia, unspecified: Secondary | ICD-10-CM

## 2019-06-19 DIAGNOSIS — Z8052 Family history of malignant neoplasm of bladder: Secondary | ICD-10-CM

## 2019-06-19 DIAGNOSIS — Z794 Long term (current) use of insulin: Secondary | ICD-10-CM

## 2019-06-19 DIAGNOSIS — Z79899 Other long term (current) drug therapy: Secondary | ICD-10-CM

## 2019-06-19 DIAGNOSIS — Z8249 Family history of ischemic heart disease and other diseases of the circulatory system: Secondary | ICD-10-CM

## 2019-06-19 DIAGNOSIS — D125 Benign neoplasm of sigmoid colon: Secondary | ICD-10-CM

## 2019-06-19 DIAGNOSIS — E7800 Pure hypercholesterolemia, unspecified: Secondary | ICD-10-CM

## 2019-06-19 DIAGNOSIS — Z5111 Encounter for antineoplastic chemotherapy: Secondary | ICD-10-CM

## 2019-06-19 DIAGNOSIS — E1142 Type 2 diabetes mellitus with diabetic polyneuropathy: Secondary | ICD-10-CM

## 2019-06-19 DIAGNOSIS — E8881 Metabolic syndrome: Secondary | ICD-10-CM

## 2019-06-19 DIAGNOSIS — I890 Lymphedema, not elsewhere classified: Secondary | ICD-10-CM

## 2019-06-19 DIAGNOSIS — T8130XA Disruption of wound, unspecified, initial encounter: Secondary | ICD-10-CM

## 2019-06-19 DIAGNOSIS — E119 Type 2 diabetes mellitus without complications: Secondary | ICD-10-CM

## 2019-06-19 LAB — COMPREHENSIVE METABOLIC PANEL
BKR A/G RATIO: 1.1 (ref 1.0–2.2)
BKR ALANINE AMINOTRANSFERASE (ALT): 23 U/L (ref 9–59)
BKR ALBUMIN: 4.2 g/dL (ref 3.6–4.9)
BKR ALKALINE PHOSPHATASE: 93 U/L (ref 9–122)
BKR ANION GAP: 10 (ref 7–17)
BKR ASPARTATE AMINOTRANSFERASE (AST): 21 U/L (ref 10–35)
BKR AST/ALT RATIO: 0.9 (ref 0.3–4.9)
BKR BILIRUBIN TOTAL: 0.3 mg/dL (ref <=1.2)
BKR BLOOD UREA NITROGEN: 20 mg/dL (ref 8–23)
BKR BUN / CREAT RATIO: 26.3 — ABNORMAL HIGH (ref 8.0–23.0)
BKR CALCIUM: 9.6 mg/dL (ref 8.8–10.2)
BKR CHLORIDE: 100 mmol/L (ref 98–107)
BKR CO2: 29 mmol/L (ref 20–30)
BKR CREATININE: 0.76 mg/dL (ref 0.40–1.30)
BKR EGFR (AFR AMER): >60 mL/min/{1.73_m2} (ref >60)
BKR EGFR (NON AFRICAN AMERICAN): >60 mL/min/{1.73_m2} (ref >60)
BKR GLOBULIN: 4 g/dL — ABNORMAL HIGH (ref 2.3–3.5)
BKR GLUCOSE: 138 mg/dL — ABNORMAL HIGH (ref 70–100)
BKR POTASSIUM: 4.3 mmol/L (ref 3.3–5.1)
BKR PROTEIN TOTAL: 8.2 g/dL (ref 6.6–8.7)
BKR SODIUM: 139 mmol/L (ref 136–144)

## 2019-06-19 LAB — PT/INR AND PTT (BH GH L LMW YH)
BKR INR: 1.03 (ref 0.92–1.08)
BKR PARTIAL THROMBOPLASTIN TIME: 25.3 s (ref 22.2–29.6)
BKR PROTHROMBIN TIME: 11 s (ref 9.9–11.5)

## 2019-06-19 LAB — CBC WITH AUTO DIFFERENTIAL
BKR WAM ABSOLUTE IMMATURE GRANULOCYTES: 0 x 1000/ÂµL (ref 0.0–0.3)
BKR WAM ABSOLUTE LYMPHOCYTE COUNT: 1.5 x 1000/ÂµL (ref 1.0–4.0)
BKR WAM ABSOLUTE NRBC: 0 x 1000/ÂµL (ref 0.0–0.0)
BKR WAM ANALYZER ANC: 6.4 x 1000/ÂµL (ref 1.0–11.0)
BKR WAM BASOPHIL ABSOLUTE COUNT: 0.1 x 1000/ÂµL — ABNORMAL HIGH (ref 0.0–0.0)
BKR WAM BASOPHILS: 0.7 % (ref 0.0–4.0)
BKR WAM EOSINOPHIL ABSOLUTE COUNT: 0.3 x 1000/ÂµL (ref 0.0–1.0)
BKR WAM EOSINOPHILS: 2.8 % (ref 0.0–7.0)
BKR WAM HEMATOCRIT: 43.9 % (ref 37.0–52.0)
BKR WAM HEMOGLOBIN: 14.2 g/dL (ref 12.0–18.0)
BKR WAM IMMATURE GRANULOCYTES: 0.4 % (ref 0.0–3.0)
BKR WAM LYMPHOCYTES: 17 % (ref 8.0–49.0)
BKR WAM MCH (PG): 28.8 pg (ref 27.0–31.0)
BKR WAM MCHC: 32.3 g/dL (ref 31.0–36.0)
BKR WAM MCV: 89 fL (ref 78.0–94.0)
BKR WAM MONOCYTE ABSOLUTE COUNT: 0.7 x 1000/ÂµL (ref 0.0–2.0)
BKR WAM MONOCYTES: 7.4 % (ref 4.0–15.0)
BKR WAM MPV: 10.7 fL (ref 6.0–11.0)
BKR WAM NEUTROPHILS: 71.7 % (ref 37.0–84.0)
BKR WAM NUCLEATED RED BLOOD CELLS: 0 % (ref 0.0–1.0)
BKR WAM PLATELETS: 287 x1000/ÂµL (ref 140–440)
BKR WAM RDW-CV: 13.4 % (ref 11.5–14.5)
BKR WAM RED BLOOD CELL COUNT: 4.9 M/ÂµL (ref 3.8–5.9)
BKR WAM WHITE BLOOD CELL COUNT: 9 x1000/ÂµL (ref 4.0–10.0)

## 2019-06-19 LAB — URINALYSIS-MACROSCOPIC W/REFLEX MICROSCOPIC
BKR BILIRUBIN, UA: NEGATIVE
BKR KETONES, UA: NEGATIVE
BKR LEUKOCYTE ESTERASE, UA: NEGATIVE
BKR NITRITE, UA: NEGATIVE
BKR PH, UA: 6 (ref 5.5–7.5)
BKR SPECIFIC GRAVITY, UA: 1.021 — ABNORMAL HIGH (ref 1.005–1.020)
BKR UROBILINOGEN, UA: 0.2 EU/dL (ref <=2.0)

## 2019-06-19 LAB — T3: BKR T3 TOTAL: 110 ng/dL

## 2019-06-19 LAB — MAGNESIUM: BKR MAGNESIUM: 1.7 mg/dL (ref 1.7–2.4)

## 2019-06-19 LAB — PHOSPHORUS     (BH GH L LMW YH): BKR PHOSPHORUS: 3.4 mg/dL (ref 2.2–4.5)

## 2019-06-19 LAB — T4, FREE: BKR FREE T4: 1.01 ng/dL

## 2019-06-19 LAB — URINE MICROSCOPIC     (BH GH LMW YH)

## 2019-06-19 LAB — LACTATE DEHYDROGENASE: BKR LACTATE DEHYDROGENASE: 178 U/L (ref 122–241)

## 2019-06-19 LAB — TSH: BKR THYROID STIMULATING HORMONE: 3.49 u[IU]/mL

## 2019-06-19 LAB — CK     (BH GH L LMW YH): BKR CREATINE KINASE TOTAL: 128 U/L (ref 11–204)

## 2019-06-19 MED ORDER — HIC 2000025461 (MRNA-4157)
Freq: Once | INTRAMUSCULAR | Status: CP
Start: 2019-06-19 — End: ?
  Administered 2019-06-19: 19:00:00 1.000 mL via INTRAMUSCULAR

## 2019-06-19 MED ORDER — HIC 2000025461 PEMBROLIZUMAB (MK-3475)
Freq: Once | INTRAVENOUS | Status: CP
Start: 2019-06-19 — End: ?
  Administered 2019-06-19: 19:00:00 100.000 mL/h via INTRAVENOUS

## 2019-06-19 NOTE — Progress Notes
HIC #?[id number]?Cycle?3, Day 1?Patient seen and examined by?Drema Halon, MD.Labs and toxicity assessment reviewed?by Drema Halon, MD. ?All labs are deemed NCS today.Concomitant medications reviewed and updated.Approved for treatment for pembro and first vaccine dose today per??Drema Halon, MD.The calendar was reviewed with the patient, and they will RTC?per protocol.?ECOG PS = 0??Concomitant medications:?MEDICATION SINGLE DOSE UNITS ROUTE FREQUECY START DATE STOP DATE INDICATION Aspirin 81 mg PO QD 2010 ongoing PPX CVD Simvastatin 20 Mg PO QD 2012 ongoing Hypercholesterolemia ?Ambien 10 mg PO QD 2016 ongoing insomnia ?Glucoside 10 mg PO QD 2014 ongoing diabetes ?Toujeo Solostar ?80 Units SQ QD ?2016 ?ongoing diabetes ? ? ? ? ? ? ? ? ? ? ? ? ? ? ? ? ? ? ? ? ? ? ? ? ? ? ? ? ? ? ? ? ? ? ? ? ? ? ? ? ? ? ? ? ? ? ? ? ??Medical History:?? ? ? ? ? ? ? ? ? ? ? ? ? ? ? ? ? ? ? ? ? ? ? ? ? ? ? ? PATIENT NAME:?Jason Rowland??STUDY ID: ?[id number] ? ? 0=NA ?????????????????????1= Unrelated ????????2= Unlikely 3=Possible ????????4= Probable ??????????5. Definite (Circle One) 0= No Action Taken ????????????????????????????????????????1= Con Med ???????????????????????????????????????????????????????????2=Dose Modified ???????????????????????????????????????3=Dose Delayed ????????????????????????????????????????????????????????4= Patient Hospitalized ?????????????????????????????????????5= Patient Taken Off Study ???????????????????6=Non Drug Therapy ???????????????????????????????????????7=Other (Specify) 1=Recovered ??????????????????????2=Still under txmt/observation ?????????????????????????????3=Alive w/Sequelae ????????????????????????????????????????????????????????????????4=Died ????????????????????????5=Resolvd to Baseline ????????????6=Grade Change ? ? ? Medical History ? ? ? ? ? ? ? ? ? ? ? ADVERSE EVENT  Is AE Intermittent? Y/N SAE ????????????Y/N Grade Expect?PEMBRO?Y/N ?Relation to?PEMBRO Expect?mRNA????????????Y/N ?Relation to?mRNA Start Date End Date/ or ?Ongoing Action Taken ??????????????(Select # above) AE OUTCOME ? Hypercholesterolemia N N 1 Med Hx Med Hx Med Hx Med Hx 2012 ongoing 1=?simvastatin 2 ? diabetes N N 1 Med Hx Med Hx Med Hx Med Hx 2010 ongoing 1-?glucoside; toujeo 2 ? Insomnia N N 1 Med Hx Med Hx Med Hx Med Hx 2016 ongoing 1- ambien 2 ? Edema?limbs N N 1 Med Hx? Med Hx Med Hx Med Hx ?03/03/2019 ongoing 0 2 ? Diarrhea? N N 1 Y? 1 ?N/A ?N/A ?05/27/2019 05/27/2019 0 5 ? ? ? ? ? ? ? ? ? ? ? ? ? ? ??Labs:Results for JLYNN, LANGILLE (MRN [id number]) as of 06/19/2019 14:42 Ref. Range 06/19/2019 10:40 06/19/2019 10:41 06/19/2019 10:44 Sodium Latest Ref Range: 136 - 144 mmol/L  139  Potassium Latest Ref Range: 3.3 - 5.1 mmol/L  4.3  Chloride Latest Ref Range: 98 - 107 mmol/L  100  CO2 Latest Ref Range: 20 - 30 mmol/L  29  Anion Gap Latest Ref Range: 7 - 17   10  BUN Latest Ref Range: 8 - 23 mg/dL  20  Creatinine Latest Ref Range: 0.40 - 1.30 mg/dL  4.40  BUN/Creatinine Ratio Latest Ref Range: 8.0 - 23.0   26.3 (H)  eGFR (NON African-American) Latest Ref Range: >60 mL/min/1.9m2  >60  eGFR (Afr Amer) Latest Ref Range: >60 mL/min/1.46m2  >60  Glucose Latest Ref Range: 70 - 100 mg/dL  347 (H)  Calcium Latest Ref Range: 8.8 - 10.2 mg/dL  9.6  Magnesium Latest Ref Range: 1.7 - 2.4 mg/dL  1.7  Phosphorus Latest Ref Range: 2.2 - 4.5 mg/dL  3.4  Total Bilirubin Latest Ref Range: <=1.2 mg/dL  0.3  Alkaline Phosphatase Latest Ref Range: 9 - 122 U/L  93  Alanine Aminotransferase (ALT) Latest Ref Range: 9 - 59 U/L  23  Aspartate Aminotransferase (AST) Latest Ref Range: 10 - 35  U/L  21  AST/ALT Ratio Latest Ref Range: 0.3 - 4.9   0.9  LD Latest Ref Range: 122 - 241 U/L  178  Total Protein Latest Ref Range: 6.6 - 8.7 g/dL  8.2  Albumin Latest Ref Range: 3.6 - 4.9 g/dL  4.2  Globulin Latest Ref Range: 2.3 - 3.5 g/dL  4.0 (H)  A/G Ratio Latest Ref Range: 1.0 - 2.2   1.1  Total CK Latest Ref Range: 11 - 204 U/L  128  T3, Total Latest Ref Range: See Comment ng/dL  161.0  Thyroid Stimulating Hormone, 3rd Gen. Latest Ref Range: See Comment ?IU/mL  3.490  Free T4 Latest Ref Range: See Comment ng/dL  9.60  WBC Latest Ref Range: 4.0 - 10.0 x1000/?L 9.0   RBC Latest Ref Range: 3.8 - 5.9 M/?L 4.9   Hemoglobin Latest Ref Range: 12.0 - 18.0 g/dL 45.4   Hematocrit Latest Ref Range: 37.0 - 52.0 % 43.9   MCV Latest Ref Range: 78.0 - 94.0 fL 89.0   MCH Latest Ref Range: 27.0 - 31.0 pg 28.8   MCHC Latest Ref Range: 31.0 - 36.0 g/dL 09.8   RDW-CV Latest Ref Range: 11.5 - 14.5 % 13.4   nRBC Latest Ref Range: 0.0 - 1.0 % 0.0   Platelets Latest Ref Range: 140 - 440 x1000/?L 287   MPV Latest Ref Range: 6.0 - 11.0 fL 10.7   Neutrophils Latest Ref Range: 37.0 - 84.0 % 71.7   Lymphocytes Latest Ref Range: 8.0 - 49.0 % 17.0   Monocytes Latest Ref Range: 4.0 - 15.0 % 7.4   Eosinophils Latest Ref Range: 0.0 - 7.0 % 2.8   Basophils Latest Ref Range: 0.0 - 4.0 % 0.7   ANC (Abs Neutrophil Count) Latest Ref Range: 1.0 - 11.0 x 1000/?L 6.4   Absolute Lymphocyte Count Latest Ref Range: 1.0 - 4.0 x 1000/?L 1.5   Monocytes (Absolute) Latest Ref Range: 0.0 - 2.0 x 1000/?L 0.7   Eosinophil Absolute Count Latest Ref Range: 0.0 - 1.0 x 1000/?L 0.3   Basophils Absolute Latest Ref Range: 0.0 - 0.0 x 1000/?L 0.1 (H)   Immature Granulocytes (Abs) Latest Ref Range: 0.0 - 0.3 x 1000/?L 0.0   nRBC Absolute Latest Ref Range: 0.0 - 0.0 x 1000/?L 0.0   Immature Granulocytes Latest Ref Range: 0.0 - 3.0 % 0.4   Prothrombin Time Latest Ref Range: 9.9 - 11.5 seconds  11.0  INR Latest Ref Range: 0.92 - 1.08   1.03  PTT Latest Ref Range: 22.2 - 29.6 seconds  25.3  Clarity, UA Latest Ref Range: Clear    Clear Specific Gravity, UA Latest Ref Range: 1.005 - 1.020    1.021 (H) pH, UA Latest Ref Range: 5.5 - 7.5 6.0 Protein, UA Latest Ref Range: Negative-Trace    2+ (A) Glucose, UA Latest Ref Range: Negative    Trace Ketones, UA Latest Ref Range: Negative    Negative Blood, UA Latest Ref Range: Negative    Small (A) Bilirubin, UA Latest Ref Range: Negative    Negative Leukocytes, UA Latest Ref Range: Negative    Negative Nitrite, UA Latest Ref Range: Negative    Negative WBC/HPF Latest Ref Range: 0 - 5 /HPF   0-1 RBC/HPF Latest Ref Range: 0 - 5 /HPF   2-5 (A) Color, UA Latest Ref Range: Yellow    Yellow Urobilinogen, UA Latest Ref Range: <=2.0 EU/dL   0.2

## 2019-06-19 NOTE — Progress Notes
HIC [id number] + PembrolizumabPt arrived to clinic for treatment with C3D1 as scheduled.  PIV placed, and  safety labs.Research PK drawn per protocol @1220  Pt had MD visit with Dr Laveda Norman,  labs reviewed.Tox assessment completed by [facility] Arlys John, and pt cleared for treatment today.  Nursing assessment complete upon arrival to treatment area, see flowsheet for details. MRNA -4157 1 mg given in 2 syringes (0.8ml) in R deltoid.  Pt tolerated pembro infusion as ordered from 1338 to 1408, line flushed with NS per protocol.  PIV flushed and d/c'd, dry dressing applied.  Discharged to home in stable condition.Pt refused AVS. Pt has copy of calendar with next appt 07/10/19.

## 2019-06-19 NOTE — Progress Notes
NAME:  Jason Rowland (Aug 08, 1945)AGE: 74 y.o. MRN:  [id number] PROVIDER: Drema Halon, MD, PhDDATE OF SERVICE: 06/19/2019 [facility] [place]  REASON FOR VISIT:  C3D1 of HIC 25461ONC DX:  Invasive melanoma right mid back, stage IIIC (pT4a N3 M0); 30 mm with microsatellitosis, mitotic rate 2 per mm2.0% tumor and immune cell PD-L1 staining50-gene panel:  DNA VARIANT DETECTED ? ? 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 ? 74% TREATMENT HX:- Adjuvant Moderna clinical trial (?A Phase 2 Randomized Study of Adjuvant Immunotherapy with the Personalized Cancer Vaccine [phone] and Pembrolizumab Versus Pembrolizumab Alone After Complete Resection of High-Risk Melanoma? Protocol [id number], HIC 47829) C1D1 05/08/19.  Randomized to the pembrolizumab and vaccine arm. Onc Hx: Jason Rowland (prefers to be called Jason Rowland) is a 74 y.o. male who works for [employer] (will be retiring at the end of 02/2019 after working there for 50 years) with a PMH of DM2 complicated by some peripheral neuropathy, HLD, and AKs who is referred by Joanie Coddington, his dermatologist for a right back metastatic melanoma.  He reports it appeared approx 10 months prior; his PCP originally thought it was a pimple.  Dr. Lorn Junes at [facility] later evaluated it and also thought it was a pimple and injected it with intralesional kenalog x 2 and oral cephalexin without improvement.  The lesion increased in size and burned whenever he leaned against anything.  Denies night sweats and weight loss.  Lucie Leather did a I&D on 01/07/19  with only slight bloody discharge, so a punch biopsy was obtain with path showing metastatic melanoma.  On 01/20/19, additional biopsies were done to try to evaluate a primary site, but we do not have this tissue.  Per patient report, these biopsies were negative. MRI brain 01/30/19 without intracranial disease.  PET done 02/07/19 showing preepiglottic FDG activity s/p ENT evaluation which was negative for any visible malignancy.  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 LDH elevated to 251 at diagnosis. His case was presented at the [facility] Mel[facility]oma tumor board on 02/13/19 and consensus was to ask ENT for random biopsies of the BOT and pre-epiglottic PET-avid areas, as we do not want to miss a head and neck cancer but otherwise not felt to be related to his melanoma.  The cervical LN were not thought to be related.  He should also have gastroenterology evaluation for a PET-avid stranding lesion in the transverse colon.  Dr. Landis Gandy (ENT) performed BOT and vallecula random biopsies 02/20/19 which only showed chronic inflammation and no malignancy.  He had a colonoscopy by Dr. Linton Flemings on 02/27/19 showing only a tubular adenoma in the sigmoid colon.  Overall, it was felt that his right mid back lesion is the site of his primary disease, and the metastatic melanoma pathology interpretation may have been complicated due to repeat I&Ds and steroid injections that may have altered the architecture of the primary lesion.  In the absence of any confirmed metastatic disease, he underwent WLE and LND with Dr. Duanne Moron on 03/03/19.  Final path showed a 30 mm thick melanoma, 2 mitoses/mm2, non-brisk TILs, no tumor regression, LVI+, microsatellites+, negative margins, and 3/35 lymph nodes (largest LN 4.5 x 3 cm) were positive for melanoma with presence of lymphatic invasion without extranodal extension.He uses a cane sometimes as he has some balance issues due to neuropathy in his feet.  Otherwise, he is fully functional, active, and independent in ADLs/iADLs.  No hx of autoimmune disease.03/27/19 signed consent for the Moderna trial;  randomized to pembro and vaccine arm.04/24/19 noted to have wound dehiscence, non-infected, healing by secondary intent.  Re-instated VNA services to assist with dressing changes and packing.Interval NW:GNFA returns for follow-up today and C3 of his clinical trial.  He will get pembrolizumab + the first dose of his personalized vaccine today.He recently went to [place] to spend some time with his daughter.  He returned over the weekend and had a negative COVID test on on 3/2 after his return.  His VNA is still managing his back wound, which is healing well -- actually much more rapid healing in the last few weeks, as his glucose control has been more tightly controlled (averaging around 160s AM level).  He denies any issues with the wound and is very happy about his progress.  No changes have been made in Toujeo dose (still on 80 units nightly).  No episodes of hypoglycemia.Denies fevers or chills.  He overall feels well. The lymphedema in the right arm remains stable and does not bother him.  He had some intermittent episodes of left eye blurred vision which resolved immediately after instilling eye drops.  He says he is overdue for his routine eye exam and will go have it checked out.  Denies any oral dryness.  Appetite is stable.   He has not received his COVID vaccination yet.Denies any change in bowel habits.  Baseline BM is 1 per day.ECOG:  0Pain: reported as 0 today (does have hx of chronic neuropathy pain)Review of Systems:No fevers, chills, night sweats, lightheadedness, HA, vision changes, CP, palpitations, SOB, DOE, cough, wheezing, abd pain, N/V, constipation, blood in the urine or stools, dysuria.  Diabetic neuropathy in the hands and feet stable.  He reports having intermittent loose stools, chronic, without blood depending on his diet (unchanged and often isolated to a specific restaurant he likes to visit with his buddies; none recently).PAST MEDICAL HISTORY:Past Medical History: Diagnosis Date ? Diabetes mellitus (HC Code)  ? High cholesterol  ? Malignant melanoma of torso excluding breast (HC Code)  SURGICAL HISTORY:Past Surgical History: Procedure Laterality Date ? COLONOSCOPY   ? CORONARY ANGIOPLASTY WITH STENT PLACEMENT Bilateral  ? laryngoscopy with biopsy  02/20/2019 ? ROTATOR CUFF REPAIR Left 1999 ? TONSILLECTOMY    age 48 ? TOTAL HIP ARTHROPLASTY Right 2002 FAMILY HISTORY:Family History Problem Relation Age of Onset ? Heart disease Mother  ? Bladder cancer Father 45 ? Kidney cancer Sister  ? Leukemia Brother 16 ? No Known Problems Daughter  ? No Known Problems Son  SOCIAL HISTORY:Social History Socioeconomic History ? Marital status: Divorced   Spouse name: Not on file ? Number of children: Not on file ? Years of education: Not on file ? Highest education level: Not on file Occupational History ? Not on file Social Needs ? Financial resource strain: Not on file ? Food insecurity   Worry: Not on file   Inability: Not on file ? Transportation needs   Medical: Not on file   Non-medical: Not on file Tobacco Use ? Smoking status: Never Smoker ? Smokeless tobacco: Never Used Substance and Sexual Activity ? Alcohol use: Not Currently ? Drug use: No ? Sexual activity: Not on file Lifestyle ? Physical activity   Days per week: Not on file   Minutes per session: Not on file ? Stress: Not on file Relationships ? Social Manufacturing systems engineer on phone: Not on file   Gets together: Not on file   Attends religious service: Not on file   Active member of  club or organization: Not on file   Attends meetings of clubs or organizations: Not on file   Relationship status: Not on file ? Intimate partner violence   Fear of current or ex partner: Not on file   Emotionally abused: Not on file   Physically abused: Not on file   Forced sexual activity: Not on file Other Topics Concern ? Not on file Social History Narrative  Divorced, works for [employer] in [place] will be retiring 03/17/19.  Has 1 son in [place] and 1 daughter in [place]. Llives in [place]  ALLERGIES:No Known AllergiesMEDICATIONS:Current Outpatient Medications Medication Sig ? acetaminophen (TYLENOL) 325 mg tablet Take 3 tablets (975 mg total) by mouth every 6 (six) hours as needed (mild to moderate pain). ? ALPRAZolam (XANAX) 0.5 mg tablet 0.5 mg.  ? aspirin 81 MG EC tablet Take 81 mg by mouth nightly.  ? bedside commode 3 in 1 commode (Patient not taking: Reported on 03/07/2019) ? docusate sodium (COLACE) 250 mg capsule Take 1 capsule (250 mg total) by mouth 2 (two) times daily as needed for constipation. ? FREESTYLE LIBRE 14 DAY sensor kit 1 each by Other route every 14 (fourteen) days. ? glipiZIDE (GLUCOTROL XL) 10 MG 24 hr tablet Take 10 mg by mouth nightly.  ? ibuprofen (ADVIL,MOTRIN) 200 mg tablet Take 2 tablets (400 mg total) by mouth every 6 (six) hours as needed (alternate with tylenol for mild to moderate pain). ? NAFTIN 2 % gel Apply 1 Application topically. ? olopatadine (PATANOL) 0.1 % ophthalmic solution as needed (takes for eye irritation as needed).  ? simvastatin (ZOCOR) 20 MG tablet Take 20 mg by mouth nightly.  ? TOUJEO SOLOSTAR U-300 300 unit/mL (1.5 mL) pen Inject 30 Units under the skin nightly. ? walker Misc Use as directed. ? zolpidem (AMBIEN) 10 mg tablet nightly.. No current facility-administered medications for this visit.  Facility-Administered Medications Ordered in Other Visits Medication ? HIC [id number] Pembrolizumab (MK-3475) 200 mg in sodium chloride 0.9% 100 mL investigational study drug VITALS: BP (!) (P) 158/92 (Site: l a, Position: Sitting, Cuff Size: Large)  - Pulse (P) 74  - Temp (P) 98.1 ?F (36.7 ?C) (Temporal)  - Resp (P) 18  - Wt (P) 124.6 kg  - SpO2 (P) 99%  - BMI (P) 39.25 kg/m?  PHYSICAL EXAM:Gen:  NAD, pleasant HEENT:  EOMI, PEARLLN:  No cervical, supraclavicular, or axillary LAD.  CV:  RRR, no m/r/gPulm:  CTA b/lAbd:  Soft, non-tender, no guarding or rebound, no organomegaly, no ascitesExt:  WWP, 2+ symmetric edema in the ankles (chronic and unchanged).  Lymphedema present in the right arm, unchanged.Neuro:   Grossly intactPsych:  Normal mood and affectSkin:  Wound on the right upper back significantly smaller on exam today approx <0.5 cm along the proximal scar with healthy, pink granulation tissue underneath; clean wound edges, packed with dressing overlying the site.  Seroma improved along the proximal end of the scar.  No concerns for infection.  Right axillary scar well healed without concerning nodularity.  LABS:Results for orders placed or performed during the hospital encounter of 06/19/19 Urinalysis-macroscopic w/reflex microscopic Result Value Ref Range  Clarity, UA Clear Clear  Color, UA Yellow Yellow  Specific Gravity, UA 1.021 (H) 1.005 - 1.020  pH, UA 6.0 5.5 - 7.5  Protein, UA 2+ (A) Negative-Trace  Glucose, UA Trace Negative  Blood, UA Small (A) Negative  Bilirubin, UA Negative Negative  Ketones, UA Negative Negative  Leukocytes, UA Negative Negative  Nitrite, UA Negative Negative  Urobilinogen, UA 0.2 <=2.0 EU/dL PT/INR and PTT     (BH GH L YH) Result Value Ref Range  Prothrombin Time 11.0 9.9 - 11.5 seconds  INR 1.03 0.92 - 1.08  PTT 25.3 22.2 - 29.6 seconds T4, free Result Value Ref Range  Free T4 1.01 See Comment ng/dL T3 Result Value Ref Range  T3, Total 110.0 See Comment ng/dL TSH Result Value Ref Range  Thyroid Stimulating Hormone 3.490 See Comment ?IU/mL CK     (BH GH L LMW YH) Result Value Ref Range  Total CK 128 11 - 204 U/L Lactate dehydrogenase Result Value Ref Range  LD 178 122 - 241 U/L Phosphorus     (BH GH L LMW YH) Result Value Ref Range  Phosphorus 3.4 2.2 - 4.5 mg/dL MAGNESIUM Result Value Ref Range  Magnesium 1.7 1.7 - 2.4 mg/dL Comprehensive metabolic panel Result Value Ref Range  Sodium 139 136 - 144 mmol/L  Potassium 4.3 3.3 - 5.1 mmol/L  Chloride 100 98 - 107 mmol/L  CO2 29 20 - 30 mmol/L  Anion Gap 10 7 - 17  Glucose 138 (H) 70 - 100 mg/dL  BUN 20 8 - 23 mg/dL  Creatinine 1.61 0.96 - 1.30 mg/dL  Calcium 9.6 8.8 - 04.5 mg/dL  BUN/Creatinine Ratio 40.9 (H) 8.0 - 23.0  Total Protein 8.2 6.6 - 8.7 g/dL  Albumin 4.2 3.6 - 4.9 g/dL  Total Bilirubin 0.3 <=8.1 mg/dL  Alkaline Phosphatase 93 9 - 122 U/L  Alanine Aminotransferase (ALT) 23 9 - 59 U/L  Aspartate Aminotransferase (AST) 21 10 - 35 U/L  Globulin 4.0 (H) 2.3 - 3.5 g/dL  A/G Ratio 1.1 1.0 - 2.2  AST/ALT Ratio 0.9 0.3 - 4.9  eGFR (Afr Amer) >60 >60 mL/min/1.85m2  eGFR (NON African-American) >60 >60 mL/min/1.42m2 CBC auto differential Result Value Ref Range  WBC 9.0 4.0 - 10.0 x1000/?L  RBC 4.9 3.8 - 5.9 M/?L  Hemoglobin 14.2 12.0 - 18.0 g/dL  Hematocrit 19.1 47.8 - 52.0 %  MCV 89.0 78.0 - 94.0 fL  MCHC 32.3 31.0 - 36.0 g/dL  RDW-CV 29.5 62.1 - 30.8 %  Platelets 287 140 - 440 x1000/?L  MPV 10.7 6.0 - 11.0 fL  ANC (Abs Neutrophil Count) 6.4 1.0 - 11.0 x 1000/?L  Neutrophils 71.7 37.0 - 84.0 %  Lymphocytes 17.0 8.0 - 49.0 %  Absolute Lymphocyte Count 1.5 1.0 - 4.0 x 1000/?L  Monocytes 7.4 4.0 - 15.0 %  Monocyte Absolute Count 0.7 0.0 - 2.0 x 1000/?L  Eosinophils 2.8 0.0 - 7.0 %  Eosinophil Absolute Count 0.3 0.0 - 1.0 x 1000/?L  Basophil 0.7 0.0 - 4.0 %  Basophil Absolute Count 0.1 (H) 0.0 - 0.0 x 1000/?L  Immature Granulocytes 0.4 0.0 - 3.0 %  Absolute Immature Granulocyte Count 0.0 0.0 - 0.3 x 1000/?L  nRBC 0.0 0.0 - 1.0 %  Absolute nRBC 0.0 0.0 - 0.0 x 1000/?L  MCH 28.8 27.0 - 31.0 pg Urine microscopic     (BH GH LMW YH) Result Value Ref Range  WBC/HPF 0-1 0 - 5 /HPF  RBC/HPF 2-5 (A) 0 - 5 /HPF IMAGING/PATH:MRI Brain and [place] CAP 04/17/19:  NEDPET 02/07/19:Head And Neck: Base of tongue and preepiglottic FDG activity is nonspecific (SUV max 6.6).Mildly FDG avid cervical lymph nodes are nonspecific, for example:*  Left level 2 ([place] image 874, 1.4 x 0.8 cm, SUV max 2.6)*  Right level 2 ([place] image 878, 1.4 x 1 cm, SUV max 2.1)CHEST:Hypermetabolic mass overlying the  right posterior 10th rib is consistent with malignancy, and involves the skin, subcutaneous fat, extending to the latissimus dorsi muscle (SUV max 15, 6.3 x 3 x 5.9 cm).Two hypermetabolic right axillary lymph nodes are highly suspicious for metastases (up to SUV max 8.3, 2.4 x 1.7 cm). Multiple additional smaller right subpectoral lymph nodes are minimally FDG avid, and are indeterminate (SUV max 1.2, subcentimeter).Azygos pulmonary fissure is incidentally noted. No suspicious pulmonary nodule is identified, although pulmonary detail is partially obscured by breathing motion. Coronary arteries and the aortic valve are calcified. The heart is on the upper bound of normal size. Nonspecific pattern of myocardial FDG activity. No mediastinal lymphadenopathy is identified.Abdomen/Pelvis: Diffuse hepatic and splenic heterogeneity most likely represents artifactual image noise, however this could conceal any small lesions.Multiple colonic diverticuli show no evidence of diverticulitis. Diffuse patchy colonic FDG activity is nonspecific, commonly inflammatory or related to a pharmacologic effect, however this could conceal any small lesions (SUV max 9 the sigmoid). Left renal cortical defect associated with a punctuate calcification likely represents scarring. Right renal calyceal calcifications likely represent nonobstructing stones (up to 0.5 cm).Normal appearance of the gallbladder, spleen, stomach, pancreas, adrenal glands, small bowel, decompressed urinary bladder.Streak artifacts arising from metallic right hip prostheses obscure nearby structures.Small retroperitoneal lymph nodes are not FDG avid, although below resolution of PET. Otherwise, no retroperitoneal, mesenteric, or pelvic lymphadenopathy is identified. Inguinal lymph nodes are nonspecific (SUV max 1.5), commonly inflammatory.MUSCULOSKELETAL:Heterogeneous marrow FDG activity is likely due to a combination of artifactual image noise, and marrow activation, however this reduces sensitivity for any small lesions (SUV max 4.6 in the sacrum). Right knee FDG activity associated with cruciate ligaments and synovium is likely degenerative or posttraumatic (SUV max 5), with small knee effusion (SUV max 3.3).IMPRESSION:1.  The accession number [id number] does not have any associated images in PACS at the time of dictation.  PET- images associated with the accession number [id number] / [id number] were interpreted in this report.2.  Hypermetabolic mass overlying the right posterior 10th rib is consistent with malignancy, and involves the skin, subcutaneous fat, extending to the latissimus dorsi muscle.3.  Right axillary nodal metastases.4.  Multiple small right subpectoral lymph nodes are indeterminate; metastases are not excluded.5.  Base of tongue and preepiglottic FDG activity is nonspecific, with slight asymmetric fullness on the left. Direct visualization is recommended to exclude primary neoplasm.6.  Minimally FDG avid bilateral level 2 cervical lymph nodes are nonspecific, possibly inflammatory.MRI brain 01/30/19:There is no intracranial hemorrhage or major vascular distribution infarct.No abnormal enhancement is seen.There is no mass effect, edema, or midline shift.The ventricles are symmetric and normal in size. Several scattered foci of T2/FLAIR hyperintense signal are noted in the periventricular and subcortical white matter, likely sequela of chronic small vessel ischemic disease. No extra-axial collection is seen.Prior lens replacements noted bilaterally.The paranasal sinuses are clear. Small right mastoid effusion noted.IMPRESSION:No evidence of intracranial metastatic disease.WLE and SLN 03/03/19:LYMPH NODES, RIGHT AXILLARY LEVELS 1, 2 AND 3, LYMPH NODE DISSECTION: ?  ? ? - ?THREE OF THIRTY-FIVE LYMPH NODES, POSITIVE FOR MELANOMA (3 OF 35) WITH LARGEST INVOLVED LYMPH NODE MEASURING 4.5 X 3 CM ? ? ?- LYMPHATIC INVASION IDENTIFIED ? ? ?- NO EXTRANODAL EXTENSION IDENTIFIED 1. ?SKIN, BACK, RIGHT UPPER, EXCISION: ? ? ? ?- MALIGNANT MELANOMA, SEE NOTE AND SYNOPTIC SUMMARY Note: Sections show a tumor composed of S100 positive epithelioid cells within the dermis and subcutaneous tissue. Cytokeratin AE1/AE3 and desmin are negative. A junctional component is not identified. The findings would be compatible with a  primary dermal melanoma or a metastatic lesion. Clinical correlation is suggested. 2. ?SKIN, BACK, EXCISION: ? ? ? ?- BENIGN FIBROADIPOSE TISSUE AND SKELETAL MUSCLE SYNOPTIC SUMMARY MELANOMA OF THE SKIN Tumor Site: ? ? Back Laterality: ? ? Right Procedure: ? ? Primary excision Maximum Tumor Thickness (Depth): ? ? 30 mm Ulceration: ? ? Not identified Mitotic Rate (Mitoses/mm2): ? ? 2 Anatomic Level: ? ? V (Melanoma invades subcutis) Growth Phase: ? ? Vertical Histologic Type: ? ? Melanoma, NOS Tumor-Infiltrating Lymphocytes: ? ? Present, non-brisk Tumor Regression: ? ? Not identifed Lymphovascular Invasion: ? ? Present Microsatellitosis: ? ? Present Margins ? ? ?Peripheral Margins: ? ? Uninvolved Deep Margin: ? ? Uninvolved Stage (AJCC 8th Ed): ? ? pT4a Nx Base of the tongue and vallecula random biopsies 02/20/19: 1. ?TONGUE, LEFT, BASE, BIOPSY: ? ? ? ?- LYMPHOID TISSUE HYPERPLASIA ? ? ?- NEGATIVE FOR MALIGNANCY 2. ?THROAT, VALLECULA, BIOPSY: ? ?  ? - LYMPHOID TISSUE HYPERPLASIA AND FOCAL CHRONIC INFLAMMATION. SEE NOTE. ? ? ?- NEGATIVE FOR MALIGNANCY ? ? ? ? ? Note: Immunostain of Sox-10 to investigate focal pigment is negative, supporting above interpretation. 3. ?THROAT, LEFT VALLECULA, BIOPSY: ? ? ? ?- LYMPHOID TISSUE HYPERPLASIA ? ? ?- NEGATIVE FOR MALIGNANCY 4. ?THROAT, RIGHT VALLECULA, BIOPSY: ? ? ? ? ? ? - LYMPHOID TISSUE HYPERPLASIA AND FOCAL CHRONIC INFLAMMATION ? ? ?- NEGATIVE FOR MALIGNANCY 5. ?TONGUE, RIGHT BASE, BIOPSY: ? ? ? ? ? ? - LYMPHOID TISSUE HYPERPLASIA AND FOCAL CHRONIC INFLAMMATION ? ? ?- NEGATIVE FOR MALIGNANCY Pathology (reviewed at [facility]), collected 01/07/19:DIAGNOSIS: ? 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 Clinical pathological correlation is recommended. MICROSCOPIC DESCRIPTION: There are atypical cells in a nodule in the dermis. The cells are positive with SOX-10 and by report are negative with LCA, Kappa, Lambda, and a cytokeratin. The cells are also positive with S-100 by report.ASSESSMENT/PLAN:Jason Rowland is a 74 y.o. 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  Initially path read as metastatic melanoma, but further workup confirms that this is the primary site, not a metastatic lesion.  Initial biopsy was likely altered due to repeat I&Ds of the area.  He had a FBSE with his dermatologist and no additional primary lesions were identified.  He is otherwise in good health except for metabolic syndrome and diabetes-associated neuropathy.  PET 02/07/19 showed the main lesion on the right upper back extending to the latissimus dorsi muscle with involved right axillary nodes and nonspecific avidity in the BOT and preepiglottis for which direct visual evaluation was recommended and done 02/11/19 and random biopsies 02/20/19 without concerns for malignancy. Colonoscopy was also done to evaluate stranding in the transverse colon, which was negative for malignancy on exam.  Now s/p WLE and LND with Dr. Duanne Moron on 03/03/19 showing a stage IIIC melanoma that was 30 mm thick, positive for LVI and microsatellites with 3/25 positive LNs.  Given the thickness of his primary melanoma and microsatellitosis, we felt that his melanoma-specific survival is closer to a stage IIID:  60% over 10 years with stage IIIC disease versus 24% with stage IIID.  He does not have a BRAF mutation, so SOC options are immune therapy at this point versus the Moderna clinical trial (?A Phase 2 Randomized Study of Adjuvant Immunotherapy with the Personalized Cancer Vaccine [phone] and Pembrolizumab Versus Pembrolizumab  Alone After Complete Resection of High-Risk Melanoma?) vs active surveillance (for which he would be extremely high risk for relapsing).  He opted for the Moderna trial and signed consent on 03/27/19; tissue passed QC inspection.  Jason Rowland was randomized to the [facility] with vaccine arm of the trial.  His back post-op wound is healing nicely, and healing has sped up since he had improved glucose control.  I suspect is should be healed by this next visit at this rate. He continues to work on maintaining tighter glucose control to aid in healing and also given the stable but elevated proteinuria in his urine, likely related to diabetes.  He has small amt of blood in the urine but RBCs/HPF only 2-5, which we are monitoring and remains stable.  He receives ongoing VNA support to assist with dressing changes on his back.  He has stable right arm lymphedema but declines any lymphedema therapy or sleeves.  Other chronic medical conditions stable.  Some left-sided dry eyes, which seems unlike to be related to immune therapy, as it not symmetric and resolves completely with hydrating eye drops.  Nevertheless, I agree that he should have his vision evaluated, as he says he is overdue for this.- labs reviewed today and stable per his baseline.  LDH wnl.  Ok for treatment C3 today.  Will get pembrolizumab with vaccine.- surveillance scans every 12 weeks; ordered [place] chest and AP to be done prior to his next visit- encouraged to get the COVID vaccine, as his age group is eligible in [place].  Ideally should receive the vaccine in the middle between cycles.   Encouraged him to sign up via MyChart of [facility] website.- ongoing wound care and dressing changes with home VNA- reviewed self exams and demonstrated knowledge on when to call if new concerning lesions arise.- routine f/u with dermatologist every 4-6 months.  Last seen 04/2019; he reports having an appt coming up soon.- RTC in 3 weeks Thursday AM with me on NP8 per trial schedule; instructed to call if any issues or questions in the interim30 min were spent in direct patient contact.  He has our clinic contact information and instructed to call if questions.  Drema Halon, MD, PhDMedical Oncology

## 2019-06-26 ENCOUNTER — Encounter: Admit: 2019-06-26 | Payer: PRIVATE HEALTH INSURANCE

## 2019-07-01 ENCOUNTER — Encounter: Admit: 2019-07-01 | Payer: PRIVATE HEALTH INSURANCE | Attending: Medical Oncology

## 2019-07-01 DIAGNOSIS — C4359 Malignant melanoma of other part of trunk: Secondary | ICD-10-CM

## 2019-07-09 ENCOUNTER — Telehealth: Admit: 2019-07-09 | Payer: PRIVATE HEALTH INSURANCE

## 2019-07-10 ENCOUNTER — Encounter: Admit: 2019-07-10 | Payer: PRIVATE HEALTH INSURANCE | Attending: Medical Oncology

## 2019-07-10 ENCOUNTER — Encounter: Admit: 2019-07-10 | Payer: PRIVATE HEALTH INSURANCE

## 2019-07-10 ENCOUNTER — Inpatient Hospital Stay: Admit: 2019-07-10 | Discharge: 2019-07-10 | Payer: BLUE CROSS/BLUE SHIELD

## 2019-07-10 ENCOUNTER — Ambulatory Visit: Admit: 2019-07-10 | Payer: BLUE CROSS/BLUE SHIELD

## 2019-07-10 DIAGNOSIS — E8881 Metabolic syndrome: Secondary | ICD-10-CM

## 2019-07-10 DIAGNOSIS — Z955 Presence of coronary angioplasty implant and graft: Secondary | ICD-10-CM

## 2019-07-10 DIAGNOSIS — R7402 Elevation of levels of lactic acid dehydrogenase (LDH): Secondary | ICD-10-CM

## 2019-07-10 DIAGNOSIS — Z006 Encounter for examination for normal comparison and control in clinical research program: Secondary | ICD-10-CM

## 2019-07-10 DIAGNOSIS — I890 Lymphedema, not elsewhere classified: Secondary | ICD-10-CM

## 2019-07-10 DIAGNOSIS — E7800 Pure hypercholesterolemia, unspecified: Secondary | ICD-10-CM

## 2019-07-10 DIAGNOSIS — U071 COVID-19 virus infection: Secondary | ICD-10-CM

## 2019-07-10 DIAGNOSIS — Z5111 Encounter for antineoplastic chemotherapy: Secondary | ICD-10-CM

## 2019-07-10 DIAGNOSIS — E1142 Type 2 diabetes mellitus with diabetic polyneuropathy: Secondary | ICD-10-CM

## 2019-07-10 DIAGNOSIS — Z5112 Encounter for antineoplastic immunotherapy: Secondary | ICD-10-CM

## 2019-07-10 DIAGNOSIS — E785 Hyperlipidemia, unspecified: Secondary | ICD-10-CM

## 2019-07-10 DIAGNOSIS — Z79899 Other long term (current) drug therapy: Secondary | ICD-10-CM

## 2019-07-10 DIAGNOSIS — Z791 Long term (current) use of non-steroidal anti-inflammatories (NSAID): Secondary | ICD-10-CM

## 2019-07-10 DIAGNOSIS — R630 Anorexia: Secondary | ICD-10-CM

## 2019-07-10 DIAGNOSIS — Z7982 Long term (current) use of aspirin: Secondary | ICD-10-CM

## 2019-07-10 DIAGNOSIS — E11649 Type 2 diabetes mellitus with hypoglycemia without coma: Secondary | ICD-10-CM

## 2019-07-10 DIAGNOSIS — E119 Type 2 diabetes mellitus without complications: Secondary | ICD-10-CM

## 2019-07-10 DIAGNOSIS — Z794 Long term (current) use of insulin: Secondary | ICD-10-CM

## 2019-07-10 DIAGNOSIS — R059 Cough: Secondary | ICD-10-CM

## 2019-07-10 DIAGNOSIS — C4359 Malignant melanoma of other part of trunk: Secondary | ICD-10-CM

## 2019-07-10 DIAGNOSIS — R197 Diarrhea, unspecified: Secondary | ICD-10-CM

## 2019-07-10 DIAGNOSIS — R430 Anosmia: Secondary | ICD-10-CM

## 2019-07-10 DIAGNOSIS — T8130XA Disruption of wound, unspecified, initial encounter: Secondary | ICD-10-CM

## 2019-07-10 DIAGNOSIS — R432 Parageusia: Secondary | ICD-10-CM

## 2019-07-10 LAB — COMPREHENSIVE METABOLIC PANEL
BKR A/G RATIO: 0.7 — ABNORMAL LOW (ref 1.0–2.2)
BKR ALANINE AMINOTRANSFERASE (ALT): 72 U/L — ABNORMAL HIGH (ref 9–59)
BKR ALBUMIN: 3.3 g/dL — ABNORMAL LOW (ref 3.6–4.9)
BKR ALKALINE PHOSPHATASE: 325 U/L — ABNORMAL HIGH (ref 9–122)
BKR ANION GAP: 9 (ref 7–17)
BKR ASPARTATE AMINOTRANSFERASE (AST): 51 U/L — ABNORMAL HIGH (ref 10–35)
BKR AST/ALT RATIO: 0.7 (ref 0.3–4.9)
BKR BILIRUBIN TOTAL: 0.8 mg/dL (ref <=1.2)
BKR BLOOD UREA NITROGEN: 19 mg/dL (ref 8–23)
BKR BUN / CREAT RATIO: 20.2 (ref 8.0–23.0)
BKR CALCIUM: 8.9 mg/dL (ref 8.8–10.2)
BKR CHLORIDE: 98 mmol/L (ref 98–107)
BKR CO2: 28 mmol/L (ref 20–30)
BKR CREATININE: 0.94 mg/dL (ref 0.40–1.30)
BKR EGFR (AFR AMER): >60 mL/min/{1.73_m2} (ref >60)
BKR EGFR (NON AFRICAN AMERICAN): >60 mL/min/{1.73_m2} (ref >60)
BKR GLOBULIN: 4.6 g/dL — ABNORMAL HIGH (ref 2.3–3.5)
BKR GLUCOSE: 130 mg/dL — ABNORMAL HIGH (ref 70–100)
BKR POTASSIUM: 4.1 mmol/L (ref 3.3–5.1)
BKR PROTEIN TOTAL: 7.9 g/dL (ref 6.6–8.7)
BKR SODIUM: 135 mmol/L — ABNORMAL LOW (ref 136–144)

## 2019-07-10 LAB — CBC WITH AUTO DIFFERENTIAL
BKR WAM ABSOLUTE IMMATURE GRANULOCYTES: 0.1 x 1000/ÂµL (ref 0.0–0.3)
BKR WAM ABSOLUTE LYMPHOCYTE COUNT: 1.2 x 1000/ÂµL (ref 1.0–4.0)
BKR WAM ABSOLUTE NRBC: 0 x 1000/ÂµL (ref 0.0–0.0)
BKR WAM ANALYZER ANC: 10.4 x 1000/ÂµL (ref 1.0–11.0)
BKR WAM BASOPHIL ABSOLUTE COUNT: 0 x 1000/ÂµL (ref 0.0–0.0)
BKR WAM BASOPHILS: 0.2 % (ref 0.0–4.0)
BKR WAM EOSINOPHIL ABSOLUTE COUNT: 0 x 1000/ÂµL (ref 0.0–1.0)
BKR WAM EOSINOPHILS: 0.3 % (ref 0.0–7.0)
BKR WAM HEMATOCRIT: 37 % (ref 37.0–52.0)
BKR WAM HEMOGLOBIN: 11.8 g/dL — ABNORMAL LOW (ref 12.0–18.0)
BKR WAM IMMATURE GRANULOCYTES: 0.4 % (ref 0.0–3.0)
BKR WAM LYMPHOCYTES: 9 % (ref 8.0–49.0)
BKR WAM MCH (PG): 28.2 pg (ref 27.0–31.0)
BKR WAM MCHC: 31.9 g/dL (ref 31.0–36.0)
BKR WAM MCV: 88.3 fL (ref 78.0–94.0)
BKR WAM MONOCYTE ABSOLUTE COUNT: 1.2 x 1000/ÂµL (ref 0.0–2.0)
BKR WAM MONOCYTES: 9.5 % (ref 4.0–15.0)
BKR WAM MPV: 10.5 fL (ref 6.0–11.0)
BKR WAM NEUTROPHILS: 80.6 % (ref 37.0–84.0)
BKR WAM NUCLEATED RED BLOOD CELLS: 0 % (ref 0.0–1.0)
BKR WAM PLATELETS: 400 x1000/ÂµL (ref 140–440)
BKR WAM RDW-CV: 13.5 % (ref 11.5–14.5)
BKR WAM RED BLOOD CELL COUNT: 4.2 M/ÂµL (ref 3.8–5.9)
BKR WAM WHITE BLOOD CELL COUNT: 12.9 x1000/ÂµL — ABNORMAL HIGH (ref 4.0–10.0)

## 2019-07-10 LAB — LACTATE DEHYDROGENASE: BKR LACTATE DEHYDROGENASE: 216 U/L (ref 122–241)

## 2019-07-10 LAB — PT/INR AND PTT (BH GH L LMW YH)
BKR INR: 1.19 — ABNORMAL HIGH (ref 0.92–1.08)
BKR PARTIAL THROMBOPLASTIN TIME: 27.4 s (ref 22.2–29.6)
BKR PROTHROMBIN TIME: 12.6 s — ABNORMAL HIGH (ref 9.9–11.5)

## 2019-07-10 LAB — PHOSPHORUS     (BH GH L LMW YH): BKR PHOSPHORUS: 2.8 mg/dL (ref 2.2–4.5)

## 2019-07-10 LAB — CK     (BH GH L LMW YH): BKR CREATINE KINASE TOTAL: 88 U/L (ref 11–204)

## 2019-07-10 LAB — SARS COV-2 (COVID-19) RNA: BKR SARS-COV-2 RNA (COVID-19) (YH): NEGATIVE

## 2019-07-10 LAB — MAGNESIUM: BKR MAGNESIUM: 2 mg/dL (ref 1.7–2.4)

## 2019-07-10 MED ORDER — EPINEPHRINE 0.3 MG/0.3 ML INJECTION, AUTO-INJECTOR
0.3 mg/ mL | INTRAMUSCULAR | Status: DC | PRN
Start: 2019-07-10 — End: 2019-07-15

## 2019-07-10 MED ORDER — GUAIFENESIN 100 MG/5 ML ORAL LIQUID
100 mg/5 mL | ORAL | 2 refills | Status: AC | PRN
Start: 2019-07-10 — End: 2019-10-02

## 2019-07-10 MED ORDER — FAMOTIDINE 4 MG/ML IN STERILE WATER (ADULT)
Freq: Once | INTRAVENOUS | Status: DC | PRN
Start: 2019-07-10 — End: 2019-07-15

## 2019-07-10 MED ORDER — DIPHENHYDRAMINE 50 MG/ML INJECTION SOLUTION
50 mg/mL | Freq: Once | INTRAVENOUS | Status: DC | PRN
Start: 2019-07-10 — End: 2019-07-15

## 2019-07-10 MED ORDER — HIC 2000025461 (MRNA-4157)
Freq: Once | INTRAMUSCULAR | Status: CP
Start: 2019-07-10 — End: ?
  Administered 2019-07-10: 20:00:00 1.000 mL via INTRAMUSCULAR

## 2019-07-10 MED ORDER — HYDROCORTISONE SODIUM SUCCINATE 100 MG SOLUTION FOR INJECTION
100 mg | Freq: Once | INTRAVENOUS | Status: DC | PRN
Start: 2019-07-10 — End: 2019-07-15

## 2019-07-10 MED ORDER — HIC 2000025461 PEMBROLIZUMAB (MK-3475)
Freq: Once | INTRAVENOUS | Status: CP
Start: 2019-07-10 — End: ?
  Administered 2019-07-10: 20:00:00 100.000 mL/h via INTRAVENOUS

## 2019-07-10 MED ORDER — SODIUM CHLORIDE 0.9 % BOLUS (NEW BAG)
0.9 % | Freq: Once | INTRAVENOUS | Status: DC | PRN
Start: 2019-07-10 — End: 2019-07-15

## 2019-07-10 MED ORDER — MEPERIDINE 25 MG/2.5 ML IN 0.9% SODIUM CHLORIDE
Freq: Once | INTRAVENOUS | Status: DC | PRN
Start: 2019-07-10 — End: 2019-07-15

## 2019-07-10 NOTE — Progress Notes
HIC #?[id number]?Cycle?4, Day 1?Patient seen and examined by?Drema Halon, MD.Labs and toxicity assessment reviewed?by Drema Halon, MD. ?All labs are deemed NCS today, LFTs will be followed. Concomitant medications reviewed and updated.Approved for treatment for pembro and vaccine dose today per??Drema Halon, MD.The calendar was reviewed with the patient, and they will RTC?per protocol.COVID test completed today due to new symptoms, COVID negative.?ECOG PS = 0??Concomitant medications:?MEDICATION SINGLE DOSE UNITS ROUTE FREQUECY START DATE STOP DATE INDICATION Aspirin 81 mg PO QD 2010 ongoing PPX CVD Simvastatin 20 Mg PO QD 2012 ongoing Hypercholesterolemia ?Ambien 10 mg PO QD 2016 ongoing insomnia ?Glucoside 10 mg PO QD 2014 ongoing diabetes ?Toujeo Solostar ?80 Units SQ QD ?2016 ?ongoing diabetes ?guaifenesin 400 mg PO q 4 hours prn 07/10/19 ongoing cough ? ? ? ? ? ? ? ? ? ? ? ? ? ? ? ? ? ? ? ? ? ? ? ? ? ? ? ? ? ? ? ? ? ? ? ? ? ? ? ? ??Medical History:?? ? ? ? ? ? ? ? ? ? ? ? ? ? ? ? ? ? ? ? ? ? ? ? ? ? ? ? PATIENT NAME:?Jason Rowland??STUDY ID: ?[id number] ? ? 0=NA ?????????????????????1= Unrelated ????????2= Unlikely 3=Possible ????????4= Probable ??????????5. Definite ([place]) 0= No Action Taken ????????????????????????????????????????1= Con Med ???????????????????????????????????????????????????????????2=Dose Modified ???????????????????????????????????????3=Dose Delayed ????????????????????????????????????????????????????????4= Patient Hospitalized ?????????????????????????????????????5= Patient Taken Off Study ???????????????????6=Non Drug Therapy ???????????????????????????????????????7=Other (Specify) 1=Recovered ??????????????????????2=Still under txmt/observation ?????????????????????????????3=Alive w/Sequelae ????????????????????????????????????????????????????????????????4=Died ????????????????????????5=Resolvd to Baseline ????????????6=Grade Change ? ? ? Medical History ? ? ? ? ? ? ? ? ? ? ? ADVERSE EVENT  Is AE Intermittent? Y/N SAE ????????????Y/N Grade Expect?PEMBRO?Y/N ?Relation to?PEMBRO Expect?mRNA????????????Y/N ?Relation to?mRNA Start Date End Date/ or ?Ongoing Action Taken ??????????????(Select # above) AE OUTCOME ? Hypercholesterolemia N N 1 Med Hx Med Hx Med Hx Med Hx 2012 ongoing 1=?simvastatin 2 ? diabetes N N 1 Med Hx Med Hx Med Hx Med Hx 2010 ongoing 1-?glucoside; toujeo 2 ? Insomnia N N 1 Med Hx Med Hx Med Hx Med Hx 2016 ongoing 1- ambien 2 ? Edema?limbs N N 1 Med Hx? Med Hx Med Hx Med Hx ?03/03/2019 ongoing 0 2 ? Diarrhea? N N 1 Y? 1 ?N/A ?N/A ?05/27/2019 05/27/2019 0 5 ? fatigue? ?N N? 1? ?Y 3? ?Y 3? ?06/26/19 ?ongoing ?0 2? ? dysgeusia N N 1 N 2 N 2 06/22/19 ongoing 0 2  anorexia N N 1 Y 2 Y 2 06/22/19 ongoing 0 2  Pain, injection site Y N 1 N 1 Y 4 06/19/19 06/23/19 0 2  Eye disorders, other (left eye droop) N N 1 N 2 N 2 07/01/19 ongoing 0 2  vomiting N N 1 Y 3 Y 3 07/08/19 07/08/19 0 1  dirrahea Y N 1 Y 3 Y 3 07/08/19 ongoing 0 2  cough N N 1 Y 3 Y 3 07/08/19 ongoing 1 = gueifenisen 2  ALT increased N N 1 Y 3 Y 3 07/10/19 ongoing 0 2  AST increased  N N 1 Y 3 Y 3 07/10/19 ongoing 0 2  Alkaline phos increased N N 2 Y 3 Y 3 07/10/19 ongoing 0 2               ??Labs:Results for CORTLAN, DOLIN (MRN [id number]) Ref. Range 07/10/2019 10:47 07/10/2019 12:31 Sodium Latest Ref Range: 136 - 144 mmol/L 135 (L)  Potassium Latest Ref Range: 3.3 - 5.1 mmol/L 4.1  Chloride Latest Ref Range: 98 - 107 mmol/L 98  CO2 Latest Ref Range:  20 - 30 mmol/L 28  Anion Gap Latest Ref Range: 7 - 17  9  BUN Latest Ref Range: 8 - 23 mg/dL 19  Creatinine Latest Ref Range: 0.40 - 1.30 mg/dL 1.61  BUN/Creatinine Ratio Latest Ref Range: 8.0 - 23.0  20.2  eGFR (NON African-American) Latest Ref Range: >60 mL/min/1.74m2 >60  eGFR (Afr Amer) Latest Ref Range: >60 mL/min/1.40m2 >60  Glucose Latest Ref Range: 70 - 100 mg/dL 096 (H)  Calcium Latest Ref Range: 8.8 - 10.2 mg/dL 8.9  Magnesium Latest Ref Range: 1.7 - 2.4 mg/dL 2.0  Phosphorus Latest Ref Range: 2.2 - 4.5 mg/dL 2.8  Total Bilirubin Latest Ref Range: <=1.2 mg/dL 0.8  Alkaline Phosphatase Latest Ref Range: 9 - 122 U/L 325 (H)  Alanine Aminotransferase (ALT) Latest Ref Range: 9 - 59 U/L 72 (H)  Aspartate Aminotransferase (AST) Latest Ref Range: 10 - 35 U/L 51 (H)  AST/ALT Ratio Latest Ref Range: 0.3 - 4.9  0.7  LD Latest Ref Range: 122 - 241 U/L 216  Total Protein Latest Ref Range: 6.6 - 8.7 g/dL 7.9  Albumin Latest Ref Range: 3.6 - 4.9 g/dL 3.3 (L)  Globulin Latest Ref Range: 2.3 - 3.5 g/dL 4.6 (H)  A/G Ratio Latest Ref Range: 1.0 - 2.2  0.7 (L)  Total CK Latest Ref Range: 11 - 204 U/L 88  WBC Latest Ref Range: 4.0 - 10.0 x1000/?L 12.9 (H)  RBC Latest Ref Range: 3.8 - 5.9 M/?L 4.2  Hemoglobin Latest Ref Range: 12.0 - 18.0 g/dL 04.5 (L)  Hematocrit Latest Ref Range: 37.0 - 52.0 % 37.0  MCV Latest Ref Range: 78.0 - 94.0 fL 88.3  MCH Latest Ref Range: 27.0 - 31.0 pg 28.2  MCHC Latest Ref Range: 31.0 - 36.0 g/dL 40.9  RDW-CV Latest Ref Range: 11.5 - 14.5 % 13.5  nRBC Latest Ref Range: 0.0 - 1.0 % 0.0  Platelets Latest Ref Range: 140 - 440 x1000/?L 400  MPV Latest Ref Range: 6.0 - 11.0 fL 10.5  Neutrophils Latest Ref Range: 37.0 - 84.0 % 80.6  Lymphocytes Latest Ref Range: 8.0 - 49.0 % 9.0  Monocytes Latest Ref Range: 4.0 - 15.0 % 9.5  Eosinophils Latest Ref Range: 0.0 - 7.0 % 0.3  Basophils Latest Ref Range: 0.0 - 4.0 % 0.2  ANC (Abs Neutrophil Count) Latest Ref Range: 1.0 - 11.0 x 1000/?L 10.4  Absolute Lymphocyte Count Latest Ref Range: 1.0 - 4.0 x 1000/?L 1.2  Monocytes (Absolute) Latest Ref Range: 0.0 - 2.0 x 1000/?L 1.2  Eosinophil Absolute Count Latest Ref Range: 0.0 - 1.0 x 1000/?L 0.0  Basophils Absolute Latest Ref Range: 0.0 - 0.0 x 1000/?L 0.0  Immature Granulocytes (Abs) Latest Ref Range: 0.0 - 0.3 x 1000/?L 0.1  nRBC Absolute Latest Ref Range: 0.0 - 0.0 x 1000/?L 0.0  Immature Granulocytes Latest Ref Range: 0.0 - 3.0 % 0.4  Prothrombin Time Latest Ref Range: 9.9 - 11.5 seconds 12.6 (H)  INR Latest Ref Range: 0.92 - 1.08  1.19 (H)  PTT Latest Ref Range: 22.2 - 29.6 seconds 27.4  SARS COV-2 (COVID-19) [facility] [facility] ([facility]) Unknown  Rpt

## 2019-07-10 NOTE — Progress Notes
NAME:  Jason Rowland (12/20/1945)AGE: 74 y.o. MRN:  [id number] PROVIDER: Drema Halon, MD, PhDDATE OF SERVICE: 07/10/2019 [facility] [place]  REASON FOR VISIT:  C4D1 of HIC [id number] DX:  Invasive melanoma right mid back, stage IIIC (pT4a N3 M0); 30 mm with microsatellitosis, mitotic rate 2 per mm2.0% tumor and immune cell PD-L1 staining50-gene panel:  DNA VARIANT DETECTED ? ? 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 ? 74% TREATMENT HX:- Adjuvant Moderna clinical trial (?A Phase 2 Randomized Study of Adjuvant Immunotherapy with the Personalized Cancer Vaccine [phone] and Pembrolizumab Versus Pembrolizumab Alone After Complete Resection of High-Risk Melanoma? Protocol [id number]-[id number], HIC 47829) C1D1 05/08/19.  Randomized to the pembrolizumab and vaccine arm. Onc Hx: [name] (prefers to be called Jason Rowland) is a 74 y.o. male who worked for 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  He reports it appeared approx 10 months prior; his PCP originally thought it was a pimple.  Dr. Lorn Junes at [facility] later evaluated it and also thought it was a pimple and injected it with intralesional kenalog x 2 and oral cephalexin without improvement.  The lesion increased in size and burned whenever he leaned against anything.  Denies night sweats and weight loss.  Lucie Leather did a I&D on 01/07/19  with only slight bloody discharge, so a punch biopsy was obtain with path showing metastatic melanoma.  On 01/20/19, additional biopsies were done to try to evaluate a primary site, but we do not have this tissue.  Per patient report, these biopsies were negative. MRI brain 01/30/19 without intracranial disease.  PET done 02/07/19 showing preepiglottic FDG activity s/p ENT evaluation which was negative for any visible malignancy.  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 LDH elevated to 251 at diagnosis. His case was presented at the [facility] Melanoma tumor board on 02/13/19 and consensus was to ask ENT for random biopsies of the BOT and pre-epiglottic PET-avid areas, as we do not want to miss a head and neck cancer but otherwise not felt to be related to his melanoma.  The cervical LN were not thought to be related.  He should also have gastroenterology evaluation for a PET-avid stranding lesion in the transverse colon.  Dr. Landis Gandy (ENT) performed BOT and vallecula random biopsies 02/20/19 which only showed chronic inflammation and no malignancy.  He had a colonoscopy by Dr. Linton Flemings on 02/27/19 showing only a tubular adenoma in the sigmoid colon.  Overall, it was felt that his right mid back lesion is the site of his primary disease, and the metastatic melanoma pathology interpretation may have been complicated due to repeat I&Ds and steroid injections that may have altered the architecture of the primary lesion.  In the absence of any confirmed metastatic disease, he underwent WLE and LND with Dr. Duanne Moron on 03/03/19.  Final path showed a 30 mm thick melanoma, 2 mitoses/mm2, non-brisk TILs, no tumor regression, LVI+, microsatellites+, negative margins, and 3/35 lymph nodes (largest LN 4.5 x 3 cm) were positive for melanoma with presence of lymphatic invasion without extranodal extension.He uses a cane sometimes as he has some balance issues due to neuropathy in his feet.  Otherwise, he is fully functional, active, and independent in ADLs/iADLs.  No hx of autoimmune disease.03/27/19 signed consent for the Moderna trial; randomized to [facility]  and vaccine arm.04/24/19 noted to have wound dehiscence, non-infected, healing by secondary intent.  Re-instated VNA services to assist with dressing changes and packing.Interval NW:GNFA returns for follow-up today and C4 of his clinical trial.  He will get pembrolizumab + his personalized vaccine today.Since his last treatment 3 weeks ago, he notes the following new sx:- fatigue since 3/11 requiring 3 naps at noon, mid-afternoon, and evening approx 45 min to 1 hr each.  He goes to bed around 10 PM and wakes up at 7 AM.  Denies issues sleeping at night.- Loss of taste, smell, and appetite 3/7- local muscle injection site pain from 3/4 - 3/8; pain was so severe, he said he could barely move his right arm- left eye ptosis since 3/16; improved since but at one point could barely open the left eye.  He was seen by his ophthalmologist and offered surgery, which may not be needed as his ptosis is improving.- 1 episode of emesis not proceeded by nausea on 3/23- intermittent loose stools without blood or cramping; for example, he had 2 BMs yesterday (1 liquid and 1 with small formed bits) and then none 2 days ago.  Baseline of 1 BM/day before treatment.  - lower extremity swelling is improved from his chronic edema- hypoglycemia to 50 this AM as he remains on 80 units of long acting insulin despite decreased food intake.- shortness of breath is slightly more noticeable but has been ongoing for several months- dry cough with difficulty falling asleep; he started taking tylenol PM yesterday.He continues to go to the casinos.  Denies any fever.  The wound on his back is now completely closed.  No longer needs dressings. The lymphedema in the right arm remains stable and does not bother him. The first dose of his COVID vaccination is scheduled for 07/22/2019.ECOG:  1Pain: reported as 0 today (does have hx of chronic neuropathy pain)Review of Systems:No fevers, chills, night sweats, lightheadedness, HA, CP, palpitations, wheezing, abd pain, constipation, blood in the urine or stools, dysuria.  Diabetic neuropathy in the hands and feet stable.  He reports having intermittent loose stools, chronic, without blood depending on his diet (unchanged and often isolated to a specific restaurant he likes to visit with his buddies; none recently).  Positive responses on ROS as noted in interval hx.PAST MEDICAL HISTORY:Past Medical History: Diagnosis Date ? Diabetes mellitus (HC Code)  ? High cholesterol  ? Malignant melanoma of torso excluding breast (HC Code)  SURGICAL HISTORY:Past Surgical History: Procedure Laterality Date ? COLONOSCOPY   ? CORONARY ANGIOPLASTY WITH STENT PLACEMENT Bilateral  ? laryngoscopy with biopsy  02/20/2019 ? ROTATOR CUFF REPAIR Left 1999 ? TONSILLECTOMY    age 15 ? TOTAL HIP ARTHROPLASTY Right 2002 FAMILY HISTORY:Family History Problem Relation Age of Onset ? Heart disease Mother  ? Bladder cancer Father 62 ? Kidney cancer Sister  ? Leukemia Brother 58 ? No Known Problems Daughter  ? No Known Problems Son  SOCIAL HISTORY:Social History Socioeconomic History ? Marital status: Divorced   Spouse name: Not on file ? Number of children: Not on file ? Years of education: Not on file ? Highest education level: Not on file Occupational History ? Not on file Social Needs ? Financial resource strain: Not on file ? Food insecurity   Worry: Not on file   Inability: Not on file ? Transportation needs   Medical: Not on file   Non-medical: Not on file Tobacco Use ? Smoking status: Never Smoker ? Smokeless tobacco: Never Used Substance and  Sexual Activity ? Alcohol use: Not Currently ? Drug use: No ? Sexual activity: Not on file Lifestyle ? Physical activity   Days per week: Not on file   Minutes per session: Not on file ? Stress: Not on file Relationships ? Social Manufacturing systems engineer on phone: Not on file   Gets together: Not on file   Attends religious service: Not on file   Active member of club or organization: Not on file   Attends meetings of clubs or organizations: Not on file   Relationship status: Not on file ? Intimate partner violence   Fear of current or ex partner: Not on file   Emotionally abused: Not on file   Physically abused: Not on file   Forced sexual activity: Not on file Other Topics Concern ? Not on file Social History Narrative  Divorced, works for [employer] in [place] will be retiring 03/17/19.  Has 1 son in [place] and 1 daughter in [place]. Llives in [place]  ALLERGIES:No Known AllergiesMEDICATIONS:Current Outpatient Medications Medication Sig ? acetaminophen (TYLENOL) 325 mg tablet Take 3 tablets (975 mg total) by mouth every 6 (six) hours as needed (mild to moderate pain). ? ALPRAZolam (XANAX) 0.5 mg tablet 0.5 mg.  ? aspirin 81 MG EC tablet Take 81 mg by mouth nightly.  ? bedside commode 3 in 1 commode (Patient not taking: Reported on 03/07/2019) ? docusate sodium (COLACE) 250 mg capsule Take 1 capsule (250 mg total) by mouth 2 (two) times daily as needed for constipation. ? FREESTYLE LIBRE 14 DAY sensor kit 1 each by Other route every 14 (fourteen) days. ? glipiZIDE (GLUCOTROL XL) 10 MG 24 hr tablet Take 10 mg by mouth nightly.  ? ibuprofen (ADVIL,MOTRIN) 200 mg tablet Take 2 tablets (400 mg total) by mouth every 6 (six) hours as needed (alternate with tylenol for mild to moderate pain). ? olopatadine (PATANOL) 0.1 % ophthalmic solution as needed (takes for eye irritation as needed).  ? simvastatin (ZOCOR) 20 MG tablet Take 20 mg by mouth nightly.  ? TOUJEO SOLOSTAR U-300 300 unit/mL (1.5 mL) pen Inject 30 Units under the skin nightly. ? walker Misc Use as directed. ? zolpidem (AMBIEN) 10 mg tablet nightly.. No current facility-administered medications for this visit.  VITALS: BP (!) (P) 141/81 (Site: r a, Position: Sitting, Cuff Size: Large)  - Pulse (!) (P) 97  - Temp (P) 97.5 ?F (36.4 ?C) (Temporal)  - Resp (P) 19  - Wt (P) 122.1 kg  - SpO2 (P) 97%  - BMI (P) 38.45 kg/m?  PHYSICAL EXAM:Gen:  NAD, pleasant HEENT:  EOMI, PEARL, left eye ptosis is noticeableLN:  No cervical, supraclavicular, or axillary LAD.  CV:  RRR, no m/r/gPulm:  CTA b/lAbd:  Soft, mild tenderness to deep palpation in the RUQ, no guarding or rebound, no organomegaly, no ascitesExt:  WWP, 1+ symmetric edema in the ankles (chronic and unchanged).  Lymphedema present in the right arm, unchanged.Neuro:   Grossly intactPsych:  Normal mood and affectSkin:  Wound on the right upper back now with full approximated edges with some residual scabbing but fully closed.  Small seromas at the medial and lateral edges of the scar.  Right axillary scar well healed without concerning nodularity.  LABS:Results for orders placed or performed during the hospital encounter of 07/10/19 CK     (BH GH L LMW YH) Result Value Ref Range  Total CK 88 11 - 204 U/L Lactate dehydrogenase Result Value Ref Range  LD 216 122 -  241 U/L Phosphorus     (BH GH L LMW YH) Result Value Ref Range  Phosphorus 2.8 2.2 - 4.5 mg/dL MAGNESIUM Result Value Ref Range  Magnesium 2.0 1.7 - 2.4 mg/dL PT/INR and PTT     (BH GH L YH) Result Value Ref Range  Prothrombin Time 12.6 (H) 9.9 - 11.5 seconds  INR 1.19 (H) 0.92 - 1.08  PTT 27.4 22.2 - 29.6 seconds CBC auto differential Result Value Ref Range  WBC 12.9 (H) 4.0 - 10.0 x1000/?L  RBC 4.2 3.8 - 5.9 M/?L  Hemoglobin 11.8 (L) 12.0 - 18.0 g/dL  Hematocrit 28.4 13.2 - 52.0 %  MCV 88.3 78.0 - 94.0 fL  MCHC 31.9 31.0 - 36.0 g/dL  RDW-CV 44.0 10.2 - 72.5 %  Platelets 400 140 - 440 x1000/?L  MPV 10.5 6.0 - 11.0 fL  ANC (Abs Neutrophil Count) 10.4 1.0 - 11.0 x 1000/?L  Neutrophils 80.6 37.0 - 84.0 %  Lymphocytes 9.0 8.0 - 49.0 %  Absolute Lymphocyte Count 1.2 1.0 - 4.0 x 1000/?L  Monocytes 9.5 4.0 - 15.0 %  Monocyte Absolute Count 1.2 0.0 - 2.0 x 1000/?L  Eosinophils 0.3 0.0 - 7.0 %  Eosinophil Absolute Count 0.0 0.0 - 1.0 x 1000/?L  Basophil 0.2 0.0 - 4.0 %  Basophil Absolute Count 0.0 0.0 - 0.0 x 1000/?L  Immature Granulocytes 0.4 0.0 - 3.0 %  Absolute Immature Granulocyte Count 0.1 0.0 - 0.3 x 1000/?L  nRBC 0.0 0.0 - 1.0 %  Absolute nRBC 0.0 0.0 - 0.0 x 1000/?L  MCH 28.2 27.0 - 31.0 pg Comprehensive metabolic panel Result Value Ref Range  Sodium 135 (L) 136 - 144 mmol/L  Potassium 4.1 3.3 - 5.1 mmol/L  Chloride 98 98 - 107 mmol/L  CO2 28 20 - 30 mmol/L  Anion Gap 9 7 - 17  Glucose 130 (H) 70 - 100 mg/dL  BUN 19 8 - 23 mg/dL  Creatinine 3.66 4.40 - 1.30 mg/dL  Calcium 8.9 8.8 - 34.7 mg/dL  BUN/Creatinine Ratio 42.5 8.0 - 23.0  Total Protein 7.9 6.6 - 8.7 g/dL  Albumin 3.3 (L) 3.6 - 4.9 g/dL  Total Bilirubin 0.8 <=9.5 mg/dL  Alkaline Phosphatase 638 (H) 9 - 122 U/L  Alanine Aminotransferase (ALT) 72 (H) 9 - 59 U/L  Aspartate Aminotransferase (AST) 51 (H) 10 - 35 U/L  Globulin 4.6 (H) 2.3 - 3.5 g/dL  A/G Ratio 0.7 (L) 1.0 - 2.2  AST/ALT Ratio 0.7 0.3 - 4.9  eGFR (Afr Amer) >60 >60 mL/min/1.33m2  eGFR (NON African-American) >60 >60 mL/min/1.53m2 IMAGING/PATH:MRI Brain and Port  [place] CAP 04/17/19:  NEDPET 02/07/19:Head And Neck: Base of tongue and preepiglottic FDG activity is nonspecific (SUV max 6.6).Mildly FDG avid cervical lymph nodes are nonspecific, for example:*  Left level 2 ([place] image 874, 1.4 x 0.8 cm, SUV max 2.6)*  Right level 2 ([place] image 878, 1.4 x 1 cm, SUV max 2.1)CHEST:Hypermetabolic mass overlying the right posterior 10th rib is consistent with malignancy, and involves the skin, subcutaneous fat, extending to the latissimus dorsi muscle (SUV max 15, 6.3 x 3 x 5.9 cm).Two hypermetabolic right axillary lymph nodes are highly suspicious for metastases (up to SUV max 8.3, 2.4 x 1.7 cm). Multiple additional smaller right subpectoral lymph nodes are minimally FDG avid, and are indeterminate (SUV max 1.2, subcentimeter).Azygos pulmonary fissure is incidentally noted. No suspicious pulmonary nodule is identified, although pulmonary detail is partially obscured by breathing motion. Coronary arteries and the aortic valve are calcified. The heart is  on the upper bound of normal size. Nonspecific pattern of myocardial FDG activity. No mediastinal lymphadenopathy is identified.Abdomen/Pelvis: Diffuse hepatic and splenic heterogeneity most likely represents artifactual image noise, however this could conceal any small lesions.Multiple colonic diverticuli show no evidence of diverticulitis. Diffuse patchy colonic FDG activity is nonspecific, commonly inflammatory or related to a pharmacologic effect, however this could conceal any small lesions (SUV max 9 the sigmoid). Left renal cortical defect associated with a punctuate calcification likely represents scarring. Right renal calyceal calcifications likely represent nonobstructing stones (up to 0.5 cm).Normal appearance of the gallbladder, spleen, stomach, pancreas, adrenal glands, small bowel, decompressed urinary bladder.Streak artifacts arising from metallic right hip prostheses obscure nearby structures.Small retroperitoneal lymph nodes are not FDG avid, although below resolution of PET. Otherwise, no retroperitoneal, mesenteric, or pelvic lymphadenopathy is identified. Inguinal lymph nodes are nonspecific (SUV max 1.5), commonly inflammatory.MUSCULOSKELETAL:Heterogeneous marrow FDG activity is likely due to a combination of artifactual image noise, and marrow activation, however this reduces sensitivity for any small lesions (SUV max 4.6 in the sacrum). Right knee FDG activity associated with cruciate ligaments and synovium is likely degenerative or posttraumatic (SUV max 5), with small knee effusion (SUV max 3.3).IMPRESSION:1.  The accession number [id number] does not have any associated images in PACS at the time of dictation.  PET-[place] images associated with the accession number [id number] / [id number] were interpreted in this report.2.  Hypermetabolic mass overlying the right posterior 10th rib is consistent with malignancy, and involves the skin, subcutaneous fat, extending to the latissimus dorsi muscle.3.  Right axillary nodal metastases.4.  Multiple small right subpectoral lymph nodes are indeterminate; metastases are not excluded.5.  Base of tongue and preepiglottic FDG activity is nonspecific, with slight asymmetric fullness on the left. Direct visualization is recommended to exclude primary neoplasm.6.  Minimally FDG avid bilateral level 2 cervical lymph nodes are nonspecific, possibly inflammatory.MRI brain 01/30/19:There is no intracranial hemorrhage or major vascular distribution infarct.No abnormal enhancement is seen.There is no mass effect, edema, or midline shift.The ventricles are symmetric and normal in size. Several scattered foci of T2/FLAIR hyperintense signal are noted in the periventricular and subcortical white matter, likely sequela of chronic small vessel ischemic disease. No extra-axial collection is seen.Prior lens replacements noted bilaterally.The paranasal sinuses are clear. Small right mastoid effusion noted.IMPRESSION:No evidence of intracranial metastatic disease.WLE and SLN 03/03/19:LYMPH NODES, RIGHT AXILLARY LEVELS 1, 2 AND 3, LYMPH NODE DISSECTION: ?  ? ? - ?THREE OF THIRTY-FIVE LYMPH NODES, POSITIVE FOR MELANOMA (3 OF 35) WITH LARGEST INVOLVED LYMPH NODE MEASURING 4.5 X 3 CM ? ? ?- LYMPHATIC INVASION IDENTIFIED ? ? ?- NO EXTRANODAL EXTENSION IDENTIFIED 1. ?SKIN, BACK, RIGHT UPPER, EXCISION: ? ? ? ?- MALIGNANT MELANOMA, SEE NOTE AND SYNOPTIC SUMMARY Note: Sections show a tumor composed of S100 positive epithelioid cells within the dermis and subcutaneous tissue. Cytokeratin AE1/AE3 and desmin are negative. A junctional component is not identified. The findings would be compatible with a primary dermal melanoma or a metastatic lesion. Clinical correlation is suggested. 2. ?SKIN, BACK, EXCISION: ? ? ? ?- BENIGN FIBROADIPOSE TISSUE AND SKELETAL MUSCLE SYNOPTIC SUMMARY MELANOMA OF THE SKIN Tumor Site: ? ? Back Laterality: ? ? Right Procedure: ? ? Primary excision Maximum Tumor Thickness (Depth): ? ? 30 mm Ulceration: ? ? Not identified Mitotic Rate (Mitoses/mm2): ? ? 2 Anatomic Level: ? ? V (Melanoma invades subcutis) Growth Phase: ? ? Vertical Histologic Type: ? ? Melanoma, NOS Tumor-Infiltrating Lymphocytes: ? ? Present, non-brisk Tumor Regression: ? ? Not identifed Lymphovascular Invasion: ? ?  Present Microsatellitosis: ? ? Present Margins ? ? ?Peripheral Margins: ? ? Uninvolved Deep Margin: ? ? Uninvolved Stage (AJCC 8th Ed): ? ? pT4a Nx Base of the tongue and vallecula random biopsies 02/20/19: 1. ?TONGUE, LEFT, BASE, BIOPSY: ? ? ? ?- LYMPHOID TISSUE HYPERPLASIA ? ? ?- NEGATIVE FOR MALIGNANCY 2. ?THROAT, VALLECULA, BIOPSY: ? ?  ? - LYMPHOID TISSUE HYPERPLASIA AND FOCAL CHRONIC INFLAMMATION. SEE NOTE. ? ? ?- NEGATIVE FOR MALIGNANCY ? ? ? ? ? Note: Immunostain of Sox-10 to investigate focal pigment is negative, supporting above interpretation. 3. ?THROAT, LEFT VALLECULA, BIOPSY: ? ? ? ?- LYMPHOID TISSUE HYPERPLASIA ? ? ?- NEGATIVE FOR MALIGNANCY 4. ?THROAT, RIGHT VALLECULA, BIOPSY: ? ? ? ? ? ? - LYMPHOID TISSUE HYPERPLASIA AND FOCAL CHRONIC INFLAMMATION ? ? ?- NEGATIVE FOR MALIGNANCY 5. ?TONGUE, RIGHT BASE, BIOPSY: ? ? ? ? ? ? - LYMPHOID TISSUE HYPERPLASIA AND FOCAL CHRONIC INFLAMMATION ? ? ?- NEGATIVE FOR MALIGNANCY Pathology (reviewed at [facility]), collected 01/07/19:DIAGNOSIS: ? 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 Clinical pathological correlation is recommended. MICROSCOPIC DESCRIPTION: There are atypical cells in a nodule in the dermis. The cells are positive with SOX-10 and by report are negative with LCA, Kappa, Lambda, and a cytokeratin. The cells are also positive with S-100 by report.ASSESSMENT/PLAN:[name] is a 74 y.o. 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  Initially path read as metastatic melanoma, but further workup confirms that this is the primary site, not a metastatic lesion.  Initial biopsy was likely altered due to repeat I&Ds of the area.  He had a FBSE with his dermatologist and no additional primary lesions were identified.  He is otherwise in good health except for metabolic syndrome and diabetes-associated neuropathy.  PET 02/07/19 showed the main lesion on the right upper back extending to the latissimus dorsi muscle with involved right axillary nodes and nonspecific avidity in the BOT and preepiglottis for which direct visual evaluation was recommended and done 02/11/19 and random biopsies 02/20/19 without concerns for malignancy. Colonoscopy was also done to evaluate stranding in the transverse colon, which was negative for malignancy on exam.  Now s/p WLE and LND with Dr. Duanne Moron on 03/03/19 showing a stage IIIC melanoma that was 30 mm thick, positive for LVI and microsatellites with 3/25 positive LNs.  Given the thickness of his primary melanoma and microsatellitosis, we felt that his melanoma-specific survival is closer to a stage IIID:  60% over 10 years with stage IIIC disease versus 24% with stage IIID.  He does not have a BRAF mutation, so SOC options are immune therapy at this point versus the Moderna clinical trial (?A Phase 2 Randomized Study of Adjuvant Immunotherapy with the Personalized Cancer Vaccine [phone] and Pembrolizumab Versus Pembrolizumab Alone After Complete Resection of High-Risk Melanoma?) vs active surveillance (for which he would be extremely high risk for relapsing).  He opted for the Moderna trial and signed consent on 03/27/19; tissue passed QC inspection.  Jason Rowland was randomized to the [facility] with vaccine arm of the trial.  His back post-op wound is now healed given the tighter glucose control.  I advised that the consider adjusting his long acting insulin in the setting of decreased oral intake to avoid episodes of hypoglycemia.  He has stable right arm lymphedema but declines any lymphedema therapy or sleeves.  More concerning is his loss of taste, smell, appetite, recent cough, and mild leukocytosis with WBC 12.9.  Although he does not have a fever, I would rule out and active COVID-19 infection prior to treatment.  He will be referred to our rapid evaluation clinic.  If negative, we can treat him later today.  - labs reviewed today.  LDH wnl.  Alk phos G2 but AST and ALT are G1.  Bilis are normal.  Coags mildly elevated.  Ok for treatment C4 today if COVID neg.  Will get pembrolizumab with vaccine.- consider AM cortisol check if fatigue worsens- instructed to d/c tylenol due to mild LFT abn; Rx for guaifenesin provided- surveillance scans every 12 weeks; ordered [place] chest and AP which are scheduled 4/12- encouraged to get the COVID vaccine, as his age group is eligible in [place].  Ideally should receive the vaccine in the middle between cycles.  If COVID positive, then will have to delay vaccination and quarantine.- back wound is now fully healed- reviewed self exams and demonstrated knowledge on when to call if new concerning lesions arise.- routine f/u with dermatologist every 4-6 months.  Last seen 04/2019; he reports having an appt coming up soon.- RTC in 3 weeks Thursday AM with me on NP8 per trial schedule if able to treat today; instructed to call if any issues or questions in the interim30 min were spent in direct patient contact.  He has our clinic contact information and instructed to call if questions.  Drema Halon, MD, PhDMedical Oncology

## 2019-07-10 NOTE — Progress Notes
Pt arrived to clinic for C4D1 [facility] HIC [id number] mRNA-4157 + Pembrolizumab.Labs drawn and PIV placed by this RN, +BR noted. Labs reviewed, pt seen and cleared for treatment by Dr. Laveda Norman. Cleared by  [facility]. Pt with c/o of loss of taste, smell, and appetite. Went to get COVID tested prior to treatment - negative. Assessment done, see flowsheet. MRNA -4157 1 mg IM injection given in 2 syringes (0.66ml) to L deltoid. Dry dressing applied to site. HIC [id number] Pembrolizumab 200mg  in NS infused over 30 minutes from 1540-1610. Flushed per protocol. Pt tolerated well. +BR noted after infusion. PIV flushed, removed, and site wrapped with coban. Discharged home in stable condition to self. AVS printout given. Pt RTC 4/15 for next treatment.

## 2019-07-10 NOTE — Progress Notes
Patient arrived to [facility] for ongoing cough, fatigue, loss of appetite, and loss of taste and smell. States he's had 10 negative tests and that this has been going on for a while. Patient covid swabbed and result is negative. Patient discharged in stable condition. AVS given.

## 2019-07-10 NOTE — Progress Notes
Re: Jason Rowland: [id number] DOB: [birth year]Date of service: 3/25/2021Referring Provider: Drema Halon, MDProvider: CHAIR 5[facility] Provider Progress NoteChief Complaint: [facility] with increasing dry cough, lost taste and smell on 3/7 has ongoing fatigue and LE swelling loss of appetite developed left eye ptosis after vaccine treatment for melenoma and right arm pain and weaknssOncology History:Invasive melanoma right mid back, stage IIIC (pT4a N3 M0); 30 mm with microsatellitosis, mitotic rate 2 per mm2.0% tumor and immune cell PD-L1 staining50-gene panel:  DNA VARIANT DETECTED ? ? 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 ? 74% TREATMENT HX:- Adjuvant Moderna clinical trial (?A Phase 2 Randomized Study of Adjuvant Immunotherapy with the Personalized Cancer Vaccine [phone] and Pembrolizumab Versus Pembrolizumab Alone After Complete Resection of High-Risk Melanoma? Protocol mRNA-[id number], HIC 21308) C1D1 05/08/19.  Randomized to the pembrolizumab and vaccine arm.ECOG performance status at the time of the visit: 0Covid-19 Screening:Lab Results Component Value Date  SARSCOV2 Negative 07/10/2019 Past Medical History:Past Medical History: Diagnosis Date ? Diabetes mellitus (HC Code)  ? High cholesterol  ? Malignant melanoma of torso excluding breast (HC Code)  Past Surgical History:Past Surgical History: Procedure Laterality Date ? COLONOSCOPY   ? CORONARY ANGIOPLASTY WITH STENT PLACEMENT Bilateral  ? laryngoscopy with biopsy  02/20/2019 ? ROTATOR CUFF REPAIR Left 1999 ? TONSILLECTOMY    age 21 ? TOTAL HIP ARTHROPLASTY Right 2002 Allergies: Patient has no known allergies.Current Medications:Current Outpatient Medications Medication Sig ? acetaminophen Take 3 tablets (975 mg total) by mouth every 6 (six) hours as needed (mild to moderate pain). ? ALPRAZolam 0.5 mg.  ? aspirin Take 81 mg by mouth nightly.  ? bedside commode 3 in 1 commode (Patient not taking: Reported on 03/07/2019) ? docusate sodium Take 1 capsule (250 mg total) by mouth 2 (two) times daily as needed for constipation. ? FREESTYLE LIBRE 14 DAY 1 each by Other route every 14 (fourteen) days. ? glipiZIDE XL Take 10 mg by mouth nightly.  ? guaiFENesin Take 20 mLs (400 mg total) by mouth every 4 (four) hours as needed for cough. ? ibuprofen Take 2 tablets (400 mg total) by mouth every 6 (six) hours as needed (alternate with tylenol for mild to moderate pain). ? olopatadine as needed (takes for eye irritation as needed).  ? simvastatin Take 20 mg by mouth nightly.  ? TOUJEO SOLOSTAR U-300 Inject 30 Units under the skin nightly. ? walker Use as directed. ? zolpidem nightly.. No current facility-administered medications for this encounter.  Facility-Administered Medications Ordered in Other Encounters Medication ? diphenhydrAMINE ? EPINEPHrine ? EPINEPHrine ? famotidine (PEPCID) IV (All ages) ? HIC [id number] Pembrolizumab (MK-3475) ? hydrocortisone IV Push OR IVPB (SOLU-CORTEF) ? meperidine PF ? sodium chloride 0.9 % (new bag)  BMW:UXLKGM of Systems Constitutional: Positive for activity change, appetite change and fatigue. Negative for chills, diaphoresis, fever and unexpected weight change. HENT: Negative.       Loss of taste and smell since 3/7 Eyes:      Left eye ptosis improving Respiratory: Positive for cough. Negative for apnea, choking, chest tightness, shortness of breath, wheezing and stridor.  Cardiovascular: Positive for leg swelling. Negative for chest pain and palpitations. Gastrointestinal: Negative.  Endocrine: Negative.  Genitourinary: Negative.  Musculoskeletal: Positive for arthralgias and myalgias. Negative for back pain, gait problem, joint swelling, neck pain and neck stiffness. Skin: Negative.       Excision wound healed per patient  Allergic/Immunologic: Positive for immunocompromised state. Negative for environmental allergies and food allergies. Neurological: Positive for  weakness. Negative for dizziness, tremors, seizures, syncope, facial asymmetry, speech difficulty, light-headedness, numbness and headaches. Hematological: Negative.  Psychiatric/Behavioral: Negative.  PE:BP: (!) 142/84, Pulse: (!) 98, Temp: 97.5 ?F (36.4 ?C), Temp src: Oral, Resp: 20, SpO2: 96 %, Pain Score: ZeroThere were no orthostatic vitals filed for this visit.Physical ExamVitals signs reviewed. Constitutional:     General: He is not in acute distress.   Appearance: Normal appearance. He is obese. He is not ill-appearing. HENT:    Head: Normocephalic and atraumatic.    Right Ear: External ear normal.    Left Ear: External ear normal.    Nose: Nose normal.    Mouth/Throat:    Mouth: Mucous membranes are moist. Eyes:    General: No scleral icterus.   Conjunctiva/sclera: Conjunctivae normal. Neck:    Musculoskeletal: Normal range of motion and neck supple. Cardiovascular:    Rate and Rhythm: Normal rate and regular rhythm.    Pulses: Normal pulses.    Heart sounds: Normal heart sounds. Pulmonary:    Effort: Pulmonary effort is normal.    Breath sounds: Normal breath sounds. No wheezing or rales. Abdominal:    General: Bowel sounds are normal.    Palpations: Abdomen is soft.    Comments: obese Musculoskeletal: Normal range of motion.    Right lower leg: Edema present.    Left lower leg: Edema present. Skin:   General: Skin is warm and dry.    Capillary Refill: Capillary refill takes less than 2 seconds. Neurological:    General: No focal deficit present.    Mental Status: He is alert and oriented to person, place, and time. Psychiatric:       Mood and Affect: Mood normal. Labs/Diagnostic Imaging/Procedures:Labs last 24 hours:Recent Results (from the past 24 hour(s)) CK     (BH GH L LMW YH)  Collection Time: 07/10/19 10:47 AM Result Value Ref Range  Total CK 88 11 - 204 U/L Lactate dehydrogenase  Collection Time: 07/10/19 10:47 AM Result Value Ref Range  LD 216 122 - 241 U/L Phosphorus     (BH GH L LMW YH)  Collection Time: 07/10/19 10:47 AM Result Value Ref Range  Phosphorus 2.8 2.2 - 4.5 mg/dL MAGNESIUM  Collection Time: 07/10/19 10:47 AM Result Value Ref Range  Magnesium 2.0 1.7 - 2.4 mg/dL PT/INR and PTT     (BH GH L YH)  Collection Time: 07/10/19 10:47 AM Result Value Ref Range  Prothrombin Time 12.6 (H) 9.9 - 11.5 seconds  INR 1.19 (H) 0.92 - 1.08  PTT 27.4 22.2 - 29.6 seconds CBC auto differential  Collection Time: 07/10/19 10:47 AM Result Value Ref Range  WBC 12.9 (H) 4.0 - 10.0 x1000/?L  RBC 4.2 3.8 - 5.9 M/?L  Hemoglobin 11.8 (L) 12.0 - 18.0 g/dL  Hematocrit 16.1 09.6 - 52.0 %  MCV 88.3 78.0 - 94.0 fL  MCHC 31.9 31.0 - 36.0 g/dL  RDW-CV 04.5 40.9 - 81.1 %  Platelets 400 140 - 440 x1000/?L  MPV 10.5 6.0 - 11.0 fL  ANC (Abs Neutrophil Count) 10.4 1.0 - 11.0 x 1000/?L  Neutrophils 80.6 37.0 - 84.0 %  Lymphocytes 9.0 8.0 - 49.0 %  Absolute Lymphocyte Count 1.2 1.0 - 4.0 x 1000/?L  Monocytes 9.5 4.0 - 15.0 %  Monocyte Absolute Count 1.2 0.0 - 2.0 x 1000/?L  Eosinophils 0.3 0.0 - 7.0 %  Eosinophil Absolute Count 0.0 0.0 - 1.0 x 1000/?L  Basophil 0.2 0.0 - 4.0 %  Basophil Absolute Count 0.0 0.0 - 0.0  x 1000/?L  Immature Granulocytes 0.4 0.0 - 3.0 %  Absolute Immature Granulocyte Count 0.1 0.0 - 0.3 x 1000/?L  nRBC 0.0 0.0 - 1.0 %  Absolute nRBC 0.0 0.0 - 0.0 x 1000/?L  MCH 28.2 27.0 - 31.0 pg Comprehensive metabolic panel  Collection Time: 07/10/19 10:47 AM Result Value Ref Range  Sodium 135 (L) 136 - 144 mmol/L  Potassium 4.1 3.3 - 5.1 mmol/L  Chloride 98 98 - 107 mmol/L  CO2 28 20 - 30 mmol/L  Anion Gap 9 7 - 17 Glucose 130 (H) 70 - 100 mg/dL  BUN 19 8 - 23 mg/dL  Creatinine 0.86 5.78 - 1.30 mg/dL  Calcium 8.9 8.8 - 46.9 mg/dL  BUN/Creatinine Ratio 62.9 8.0 - 23.0  Total Protein 7.9 6.6 - 8.7 g/dL  Albumin 3.3 (L) 3.6 - 4.9 g/dL  Total Bilirubin 0.8 <=5.2 mg/dL  Alkaline Phosphatase 841 (H) 9 - 122 U/L  Alanine Aminotransferase (ALT) 72 (H) 9 - 59 U/L  Aspartate Aminotransferase (AST) 51 (H) 10 - 35 U/L  Globulin 4.6 (H) 2.3 - 3.5 g/dL  A/G Ratio 0.7 (L) 1.0 - 2.2  AST/ALT Ratio 0.7 0.3 - 4.9  eGFR (Afr Amer) >60 >60 mL/min/1.72m2  eGFR (NON African-American) >60 >60 mL/min/1.32m2 SARS CoV-2 (COVID-19) RNA - [place] LMW YH)  Collection Time: 07/10/19 12:31 PM  Specimen: Nasopharynx; Viral Result Value Ref Range  SARS-CoV-2 RNA (COVID-19) Negative Negative Imaging impressions last 24 hours:[facility] Clinical Impression:R/O COVID-19 NegativeAssessment/Plan/[facility] Course:LabsCOVID swab[facility] Disposition:-Patient discharged from [facility] and to NP 8 for treatment Electronically Signed by Bing Neighbors, APRN, July 10, 2019

## 2019-07-18 ENCOUNTER — Telehealth: Admit: 2019-07-18 | Payer: PRIVATE HEALTH INSURANCE

## 2019-07-18 ENCOUNTER — Encounter: Admit: 2019-07-18 | Payer: PRIVATE HEALTH INSURANCE

## 2019-07-18 MED ORDER — DIPHENOXYLATE-ATROPINE 2.5 MG-0.025 MG TABLET
ORAL_TABLET | Freq: Four times a day (QID) | ORAL | 1 refills | Status: AC | PRN
Start: 2019-07-18 — End: 2021-01-14

## 2019-07-18 NOTE — Telephone Encounter
Spoke with Zayde - has been having 4-6 episodes of liquidy diarrhea since last treatment on 3/25 - also states abdominal cramping and gurgling, denies blood and mucous.  Arvin also continues with no taste, had covid test 3/25, negative.  Roger denies fevers, headaches, nausea.    Discussed with Dr Laveda Norman Greig Castilla with stay with BRAT diet (explained in detail).  He will also start imodium - Kern will take 2 tablets with first diarrhea episode and 1 tablet every 2 hours afterward until diarrhea resolves.  Dr Laveda Norman will also order lomotil in case imodium not working.  Dr Laveda Norman will touch base with Greig Castilla later on today - Pao knows if diarrhea continues over the weekend even though taking imodium, he will call to be evaluated.  Electronically Signed by Alda Ponder, RN, July 18, 2019

## 2019-07-18 NOTE — Telephone Encounter
Patient had treatment 8 days ago and he has had diarrhea every single day since treatment and he goes about 6 times per day. He has spray like diarrhea. No fever reported. Patient has loss of taste completely. No nausea and no headaches. Please reach out to this patient. Please call back after 3:30pm.

## 2019-07-20 ENCOUNTER — Telehealth: Admit: 2019-07-20 | Payer: PRIVATE HEALTH INSURANCE

## 2019-07-20 NOTE — Telephone Encounter
Called Jason Rowland this morning to check in regarding his diarrhea.  He ended up taking 5 total tablets of imodium yesterday (no lomotil), and the diarrhea has stopped.  He has not had a BM today yet.  He says he has not abdominal pain or cramping.  Overall feeling much better.  He will let us know if he has further issues.  I instructed him to hold off on further imodium if diarrhea is now resolved, and he voiced understanding.  He is drinking 1:1 diluted diet gatorade for electrolytes.

## 2019-07-28 ENCOUNTER — Inpatient Hospital Stay: Admit: 2019-07-28 | Discharge: 2019-07-28 | Payer: BLUE CROSS/BLUE SHIELD

## 2019-07-28 DIAGNOSIS — Z006 Encounter for examination for normal comparison and control in clinical research program: Secondary | ICD-10-CM

## 2019-07-28 DIAGNOSIS — C4359 Malignant melanoma of other part of trunk: Secondary | ICD-10-CM

## 2019-07-28 MED ORDER — SODIUM CHLORIDE 0.9 % BOLUS (NEW BAG)
0.9 % | Freq: Once | INTRAVENOUS | Status: CP | PRN
Start: 2019-07-28 — End: ?
  Administered 2019-07-28: 21:00:00 0.9 mL/h via INTRAVENOUS

## 2019-07-28 MED ORDER — IOHEXOL 350 MG IODINE/ML INTRAVENOUS SOLUTION
350 mg iodine/mL | Freq: Once | INTRAVENOUS | Status: CP | PRN
Start: 2019-07-28 — End: ?
  Administered 2019-07-28: 21:00:00 350 mL via INTRAVENOUS

## 2019-07-31 ENCOUNTER — Inpatient Hospital Stay: Admit: 2019-07-31 | Discharge: 2019-07-31 | Payer: BLUE CROSS/BLUE SHIELD

## 2019-07-31 ENCOUNTER — Encounter: Admit: 2019-07-31 | Payer: PRIVATE HEALTH INSURANCE

## 2019-07-31 ENCOUNTER — Ambulatory Visit: Admit: 2019-07-31 | Payer: BLUE CROSS/BLUE SHIELD

## 2019-07-31 DIAGNOSIS — Z806 Family history of leukemia: Secondary | ICD-10-CM

## 2019-07-31 DIAGNOSIS — E1142 Type 2 diabetes mellitus with diabetic polyneuropathy: Secondary | ICD-10-CM

## 2019-07-31 DIAGNOSIS — Z794 Long term (current) use of insulin: Secondary | ICD-10-CM

## 2019-07-31 DIAGNOSIS — Z006 Encounter for examination for normal comparison and control in clinical research program: Secondary | ICD-10-CM

## 2019-07-31 DIAGNOSIS — Z7982 Long term (current) use of aspirin: Secondary | ICD-10-CM

## 2019-07-31 DIAGNOSIS — C4359 Malignant melanoma of other part of trunk: Secondary | ICD-10-CM

## 2019-07-31 DIAGNOSIS — Z955 Presence of coronary angioplasty implant and graft: Secondary | ICD-10-CM

## 2019-07-31 DIAGNOSIS — Z8052 Family history of malignant neoplasm of bladder: Secondary | ICD-10-CM

## 2019-07-31 DIAGNOSIS — Z8051 Family history of malignant neoplasm of kidney: Secondary | ICD-10-CM

## 2019-07-31 DIAGNOSIS — R197 Diarrhea, unspecified: Secondary | ICD-10-CM

## 2019-07-31 DIAGNOSIS — R059 Cough: Secondary | ICD-10-CM

## 2019-07-31 DIAGNOSIS — E785 Hyperlipidemia, unspecified: Secondary | ICD-10-CM

## 2019-07-31 DIAGNOSIS — Z79899 Other long term (current) drug therapy: Secondary | ICD-10-CM

## 2019-07-31 DIAGNOSIS — Z789 Other specified health status: Secondary | ICD-10-CM

## 2019-07-31 DIAGNOSIS — R630 Anorexia: Secondary | ICD-10-CM

## 2019-07-31 DIAGNOSIS — Z5112 Encounter for antineoplastic immunotherapy: Secondary | ICD-10-CM

## 2019-07-31 DIAGNOSIS — R432 Parageusia: Secondary | ICD-10-CM

## 2019-07-31 DIAGNOSIS — E119 Type 2 diabetes mellitus without complications: Secondary | ICD-10-CM

## 2019-07-31 DIAGNOSIS — R430 Anosmia: Secondary | ICD-10-CM

## 2019-07-31 DIAGNOSIS — E7800 Pure hypercholesterolemia, unspecified: Secondary | ICD-10-CM

## 2019-07-31 LAB — COMPREHENSIVE METABOLIC PANEL
BKR A/G RATIO: 0.9 — ABNORMAL LOW (ref 1.0–2.2)
BKR ALANINE AMINOTRANSFERASE (ALT): 21 U/L (ref 9–59)
BKR ALBUMIN: 3.9 g/dL (ref 3.6–4.9)
BKR ALKALINE PHOSPHATASE: 116 U/L (ref 9–122)
BKR ANION GAP: 8 (ref 7–17)
BKR ASPARTATE AMINOTRANSFERASE (AST): 21 U/L (ref 10–35)
BKR AST/ALT RATIO: 1 (ref 0.3–4.9)
BKR BILIRUBIN TOTAL: 0.4 mg/dL (ref <=1.2)
BKR BLOOD UREA NITROGEN: 20 mg/dL (ref 8–23)
BKR BUN / CREAT RATIO: 25.3 — ABNORMAL HIGH (ref 8.0–23.0)
BKR CALCIUM: 9.5 mg/dL (ref 8.8–10.2)
BKR CHLORIDE: 98 mmol/L (ref 98–107)
BKR CO2: 30 mmol/L (ref 20–30)
BKR CREATININE: 0.79 mg/dL (ref 0.40–1.30)
BKR EGFR (AFR AMER): >60 mL/min/{1.73_m2} (ref >60)
BKR EGFR (NON AFRICAN AMERICAN): >60 mL/min/{1.73_m2} (ref >60)
BKR GLOBULIN: 4.4 g/dL — ABNORMAL HIGH (ref 2.3–3.5)
BKR GLUCOSE: 197 mg/dL — ABNORMAL HIGH (ref 70–100)
BKR POTASSIUM: 4.5 mmol/L (ref 3.3–5.1)
BKR PROTEIN TOTAL: 8.3 g/dL (ref 6.6–8.7)
BKR SODIUM: 136 mmol/L (ref 136–144)

## 2019-07-31 LAB — CBC WITH AUTO DIFFERENTIAL
BKR WAM ABSOLUTE IMMATURE GRANULOCYTES: 0 x 1000/ÂµL (ref 0.0–0.3)
BKR WAM ABSOLUTE LYMPHOCYTE COUNT: 1.8 x 1000/ÂµL (ref 1.0–4.0)
BKR WAM ABSOLUTE NRBC: 0 x 1000/ÂµL (ref 0.0–0.0)
BKR WAM ANALYZER ANC: 7.4 x 1000/ÂµL (ref 1.0–11.0)
BKR WAM BASOPHIL ABSOLUTE COUNT: 0.1 x 1000/ÂµL — ABNORMAL HIGH (ref 0.0–0.0)
BKR WAM BASOPHILS: 0.7 % (ref 0.0–4.0)
BKR WAM EOSINOPHIL ABSOLUTE COUNT: 0.2 x 1000/ÂµL (ref 0.0–1.0)
BKR WAM EOSINOPHILS: 2.2 % (ref 0.0–7.0)
BKR WAM HEMATOCRIT: 44 % (ref 37.0–52.0)
BKR WAM HEMOGLOBIN: 14.1 g/dL (ref 12.0–18.0)
BKR WAM IMMATURE GRANULOCYTES: 0.3 % (ref 0.0–3.0)
BKR WAM LYMPHOCYTES: 17.5 % (ref 8.0–49.0)
BKR WAM MCH (PG): 27.7 pg (ref 27.0–31.0)
BKR WAM MCHC: 32 g/dL (ref 31.0–36.0)
BKR WAM MCV: 86.4 fL (ref 78.0–94.0)
BKR WAM MONOCYTE ABSOLUTE COUNT: 0.7 x 1000/ÂµL (ref 0.0–2.0)
BKR WAM MONOCYTES: 6.8 % (ref 4.0–15.0)
BKR WAM MPV: 10.6 fL (ref 6.0–11.0)
BKR WAM NEUTROPHILS: 72.5 % (ref 37.0–84.0)
BKR WAM NUCLEATED RED BLOOD CELLS: 0 % (ref 0.0–1.0)
BKR WAM PLATELETS: 333 x1000/ÂµL (ref 140–440)
BKR WAM RDW-CV: 13.7 % (ref 11.5–14.5)
BKR WAM RED BLOOD CELL COUNT: 5.1 M/ÂµL (ref 3.8–5.9)
BKR WAM WHITE BLOOD CELL COUNT: 10.2 x1000/ÂµL — ABNORMAL HIGH (ref 4.0–10.0)

## 2019-07-31 LAB — TSH: BKR THYROID STIMULATING HORMONE: 5.5 u[IU]/mL — ABNORMAL HIGH

## 2019-07-31 LAB — T4, FREE: BKR FREE T4: 0.93 ng/dL

## 2019-07-31 LAB — PHOSPHORUS     (BH GH L LMW YH): BKR PHOSPHORUS: 3.2 mg/dL (ref 2.2–4.5)

## 2019-07-31 LAB — LACTATE DEHYDROGENASE: BKR LACTATE DEHYDROGENASE: 263 U/L — ABNORMAL HIGH (ref 122–241)

## 2019-07-31 LAB — T3: BKR T3 TOTAL: 86.7 ng/dL

## 2019-07-31 LAB — CK     (BH GH L LMW YH): BKR CREATINE KINASE TOTAL: 63 U/L (ref 11–204)

## 2019-07-31 LAB — PT/INR AND PTT (BH GH L LMW YH)
BKR INR: 1.02 (ref 0.92–1.08)
BKR PARTIAL THROMBOPLASTIN TIME: 25.4 s (ref 22.2–29.6)
BKR PROTHROMBIN TIME: 10.9 s (ref 9.9–11.5)

## 2019-07-31 LAB — MAGNESIUM: BKR MAGNESIUM: 1.7 mg/dL (ref 1.7–2.4)

## 2019-07-31 MED ORDER — EPINEPHRINE 0.3 MG/0.3 ML INJECTION, AUTO-INJECTOR
0.3 mg/ mL | INTRAMUSCULAR | Status: DC | PRN
Start: 2019-07-31 — End: 2019-08-05

## 2019-07-31 MED ORDER — HIC 2000025461 (MRNA-4157)
Freq: Once | INTRAMUSCULAR | Status: CP
Start: 2019-07-31 — End: ?
  Administered 2019-07-31: 18:00:00 1.000 mL via INTRAMUSCULAR

## 2019-07-31 MED ORDER — HIC 2000025461 PEMBROLIZUMAB (MK-3475)
Freq: Once | INTRAVENOUS | Status: CP
Start: 2019-07-31 — End: ?
  Administered 2019-07-31: 18:00:00 100.000 mL/h via INTRAVENOUS

## 2019-07-31 MED ORDER — MEPERIDINE 25 MG/2.5 ML IN 0.9% SODIUM CHLORIDE
Freq: Once | INTRAVENOUS | Status: DC | PRN
Start: 2019-07-31 — End: 2019-08-05

## 2019-07-31 MED ORDER — SODIUM CHLORIDE 0.9 % BOLUS (NEW BAG)
0.9 % | Freq: Once | INTRAVENOUS | Status: DC | PRN
Start: 2019-07-31 — End: 2019-08-05

## 2019-07-31 MED ORDER — HYDROCORTISONE SODIUM SUCCINATE 100 MG SOLUTION FOR INJECTION
100 mg | Freq: Once | INTRAVENOUS | Status: DC | PRN
Start: 2019-07-31 — End: 2019-08-05

## 2019-07-31 MED ORDER — FAMOTIDINE 4 MG/ML IN STERILE WATER (ADULT)
Freq: Once | INTRAVENOUS | Status: DC | PRN
Start: 2019-07-31 — End: 2019-08-05

## 2019-07-31 MED ORDER — DIPHENHYDRAMINE 50 MG/ML INJECTION SOLUTION
50 mg/mL | Freq: Once | INTRAVENOUS | Status: DC | PRN
Start: 2019-07-31 — End: 2019-08-05

## 2019-07-31 NOTE — Progress Notes
Foy presented to clinic today for C5D1 [facility] #[id number] mRNA-4157 + Pembrolizumab. PIV placed and safety labs drawn by RN. Pt seen by Dr. Laveda Norman, labs reviewed and pt cleared to proceed with tx per provider and CRC. Pre-dose blood drawn. HIC 60454 mRNA-4157 1mg  IM injection given in 2 syringes (0.54mL each) to R deltoid at 1330. Dry drsg applied. HIC 09811 Pembrolizumab 200mg  in NS infused over 30 minutes from 1335-1405. Flushed per protocol. Pt tolerated without issue. +BR pre and post infusion. PIV flushed and removed per protocol, site wrapped with Coban. DC home in stable condition in care of self. RTC 5/6 for next treatment.

## 2019-07-31 NOTE — Progress Notes
NAME:  Jason Rowland (07/05/1945)AGE: 74 y.o. MRN:  [id number] PROVIDER: Drema Halon, MD, PhDDATE OF SERVICE: 07/31/2019 [facility] [place]  REASON FOR VISIT:  C5D1 of HIC 25461ONC DX:  Invasive melanoma right mid back, stage IIIC (pT4a N3 M0); 30 mm with microsatellitosis, mitotic rate 2 per mm2.0% tumor and immune cell PD-L1 staining50-gene panel:  DNA VARIANT DETECTED ? ? 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 ? 74% TREATMENT HX:- Adjuvant Moderna clinical trial (?A Phase 2 Randomized Study of Adjuvant Immunotherapy with the Personalized Cancer Vaccine [phone] and Pembrolizumab Versus Pembrolizumab Alone After Complete Resection of High-Risk Melanoma? Protocol [id number], HIC 95284) C1D1 05/08/19.  Randomized to the pembrolizumab and vaccine arm. Onc Hx: Jason Rowland (prefers to be called Jason Rowland) is a 74 y.o. male who worked for 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  He reports it appeared approx 10 months prior; his PCP originally thought it was a pimple.  Dr. Lorn Junes at [facility] later evaluated it and also thought it was a pimple and injected it with intralesional kenalog x 2 and oral cephalexin without improvement.  The lesion increased in size and burned whenever he leaned against anything.  Denies night sweats and weight loss.  Lucie Leather did a I&D on 01/07/19  with only slight bloody discharge, so a punch biopsy was obtain with path showing metastatic melanoma.  On 01/20/19, additional biopsies were done to try to evaluate a primary site, but we do not have this tissue.  Per patient report, these biopsies were negative. MRI brain 01/30/19 without intracranial disease.  PET done 02/07/19 showing preepiglottic FDG activity s/p ENT evaluation which was negative for any visible malignancy.  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 LDH elevated to 251 at diagnosis. His case was presented at the [facility] Mel[facility]oma tumor board on 02/13/19 and consensus was to ask ENT for random biopsies of the BOT and pre-epiglottic PET-avid areas, as we do not want to miss a head and neck cancer but otherwise not felt to be related to his melanoma.  The cervical LN were not thought to be related.  He should also have gastroenterology evaluation for a PET-avid stranding lesion in the transverse colon.  Dr. Landis Gandy (ENT) performed BOT and vallecula random biopsies 02/20/19 which only showed chronic inflammation and no malignancy.  He had a colonoscopy by Dr. Linton Flemings on 02/27/19 showing only a tubular adenoma in the sigmoid colon.  Overall, it was felt that his right mid back lesion is the site of his primary disease, and the metastatic melanoma pathology interpretation may have been complicated due to repeat I&Ds and steroid injections that may have altered the architecture of the primary lesion.  In the absence of any confirmed metastatic disease, he underwent WLE and LND with Dr. Duanne Moron on 03/03/19.  Final path showed a 30 mm thick melanoma, 2 mitoses/mm2, non-brisk TILs, no tumor regression, LVI+, microsatellites+, negative margins, and 3/35 lymph nodes (largest LN 4.5 x 3 cm) were positive for melanoma with presence of lymphatic invasion without extranodal extension.He uses a cane sometimes as he has some balance issues due to neuropathy in his feet.  Otherwise, he is fully functional, active, and independent in ADLs/iADLs.  No hx of autoimmune disease.03/27/19 signed consent for the Moderna trial; randomized to [facility]  and vaccine arm.04/24/19 noted to have wound dehiscence, non-infected, healing by secondary intent.  Re-instated VNA services to assist with dressing changes and packing.  Wound completed healed by 06/20/19.Interval ZO:XWRU returns for follow-up today and C5 of his clinical trial.  He will get pembrolizumab + his personalized vaccine today.Since his last treatment 3 weeks ago, he notes the following issues:- gas pains Gr1 intermittent relieved by flatus- Loss of taste Gr2, smell, and appetite since 3/7 which remains stable; loss of approx 19.2 lbs since 05/2019- he had diarrhea Gr2 after his last treatment but began imodium on 07/19/2019 with resolution.  Never had blood in the stool or abd cramping.  Last took 1 tablet of imodium on 4/13 given 1 episode of mildly loose stool and denies any further issues.- fatigue (Gr1) since 3/11 which remains stable.  Has trouble getting work done at home.  Takes a 20-30 min naps about 1-2/days.  Has difficulty going to sleep; still taking his chronic ambien.- local muscle injection site pain Gr1 achy pain x4 days (this occurred after his last dose again)- left eye ptosis that occurred on 3/16 completely resolved- no further episodes of emesis- lower extremity swelling is significantly improved from his chronic edema- glucose levels under better control; long-acting insulin dose now 40-50 units qhs- shortness of breath stable compared to prior to initiation of clinical trial- dry cough Gr1 with difficulty falling asleep which remains He continues to go to the casinos.  Denies any fever.  Got his first dose of COVID 07/22/2019 without issues.April 22 to 29th, he plans on going to [facility] again.ECOG:  1Pain: reported as 0 today (does have hx of chronic neuropathy pain)Review of Systems:No fevers, chills, night sweats, lightheadedness, HA, CP, palpitations, wheezing, abd pain, constipation, blood in the urine or stools, dysuria.  Diabetic neuropathy in the hands and feet stable.  He has a prior hx of intermittent loose stools, chronic, without blood depending on his diet (unchanged and often isolated to a specific restaurant he likes to visit with his buddies).  Positive responses on ROS as noted in interval hx.PAST MEDICAL HISTORY:Past Medical History: Diagnosis Date ? Diabetes mellitus (HC Code)  ? High cholesterol  ? Malignant melanoma of torso excluding breast (HC Code)  SURGICAL HISTORY:Past Surgical History: Procedure Laterality Date ? COLONOSCOPY   ? CORONARY ANGIOPLASTY WITH STENT PLACEMENT Bilateral  ? laryngoscopy with biopsy  02/20/2019 ? ROTATOR CUFF REPAIR Left 1999 ? TONSILLECTOMY    age 66 ? TOTAL HIP ARTHROPLASTY Right 2002 FAMILY HISTORY:Family History Problem Relation Age of Onset ? Heart disease Mother  ? Bladder cancer Father 57 ? Kidney cancer Sister  ? Leukemia Brother 81 ? No Known Problems Daughter  ? No Known Problems Son  SOCIAL HISTORY:Social History Socioeconomic History ? Marital status: Divorced   Spouse name: Not on file ? Number of children: Not on file ? Years of education: Not on file ? Highest education level: Not on file Occupational History ? Not on file Social Needs ? Financial resource strain: Not on file ? Food insecurity   Worry: Not on file   Inability: Not on file ? Transportation needs   Medical: Not on file   Non-medical: Not on file Tobacco Use ? Smoking status: Never Smoker ? Smokeless tobacco: Never Used Substance and Sexual Activity ? Alcohol use: Not Currently ? Drug use: No ? Sexual activity: Not on file Lifestyle ? Physical activity   Days per week: Not on file   Minutes per session: Not on file ?  Stress: Not on file Relationships ? Social Manufacturing systems engineer on phone: Not on file   Gets together: Not on file   Attends religious service: Not on file   Active member of club or organization: Not on file   Attends meetings of clubs or organizations: Not on file   Relationship status: Not on file ? Intimate partner violence   Fear of current or ex partner: Not on file   Emotionally abused: Not on file   Physically abused: Not on file   Forced sexual activity: Not on file Other Topics Concern ? Not on file Social History Narrative  Divorced, works for [employer] in [place] will be retiring 03/17/19.  Has 1 son in [place] and 1 daughter in [place]. Llives in [place]  ALLERGIES:No Known AllergiesMEDICATIONS:Current Outpatient Medications Medication Sig ? acetaminophen (TYLENOL) 325 mg tablet Take 3 tablets (975 mg total) by mouth every 6 (six) hours as needed (mild to moderate pain). ? ALPRAZolam (XANAX) 0.5 mg tablet 0.5 mg.  ? aspirin 81 MG EC tablet Take 81 mg by mouth nightly.  ? bedside commode 3 in 1 commode (Patient not taking: Reported on 03/07/2019) ? docusate sodium (COLACE) 250 mg capsule Take 1 capsule (250 mg total) by mouth 2 (two) times daily as needed for constipation. ? FREESTYLE LIBRE 14 DAY sensor kit 1 each by Other route every 14 (fourteen) days. ? glipiZIDE (GLUCOTROL XL) 10 MG 24 hr tablet Take 10 mg by mouth nightly.  ? guaiFENesin (ROBITUSSIN) 100 mg/5 mL Liqd Take 20 mLs (400 mg total) by mouth every 4 (four) hours as needed for cough. ? ibuprofen (ADVIL,MOTRIN) 200 mg tablet Take 2 tablets (400 mg total) by mouth every 6 (six) hours as needed (alternate with tylenol for mild to moderate pain). ? olopatadine (PATANOL) 0.1 % ophthalmic solution as needed (takes for eye irritation as needed).  ? simvastatin (ZOCOR) 20 MG tablet Take 20 mg by mouth nightly.  ? TOUJEO SOLOSTAR U-300 300 unit/mL (1.5 mL) pen Inject 30 Units under the skin nightly. ? walker Misc Use as directed. ? zolpidem (AMBIEN) 10 mg tablet nightly.. No current facility-administered medications for this visit.  Facility-Administered Medications Ordered in Other Visits Medication ? diphenhydrAMINE (BENADRYL) injection 50 mg ? EPINEPHrine (EPI-PEN) auto-injector 0.3 mg ? EPINEPHrine (EPI-PEN) auto-injector 0.3 mg ? famotidine (PF) (PEPCID) 20 mg in water for injection, sterile 5 mL Injection ? HIC [id number] ([id number]) investigational study drug 1 mg ? HIC [id number] Pembrolizumab (MK-3475) 200 mg in sodium chloride 0.9% 100 mL investigational study drug ? hydrocortisone sodium succinate (Solu-CORTEF) injection 100 mg ? meperidine PF (DEMEROL) 25 mg in sodium chloride 0.9% PF 2.5 mL Injection ? sodium chloride 0.9 % (new bag) bolus 500 mL VITALS: BP (!) (P) 142/86 (Site: r l, Position: Sitting, Cuff Size: Large)  - Pulse (P) 66  - Temp (P) 97.7 ?F (36.5 ?C) (Temporal)  - Resp (P) 18  - Wt (P) 116.7 kg  - SpO2 (P) 99%  - BMI (P) 36.75 kg/m?  PHYSICAL EXAM:Gen:  NAD, pleasant HEENT:  EOMI, PEARL, left eye ptosis completely resolvedLN:  No cervical, supraclavicular, or axillary LAD.  CV:  RRR, no m/r/gPulm:  CTA b/lAbd:  Soft, no tenderness, no guarding or rebound, no organomegaly, no ascitesExt:  WWP, trace symmetric edema in the ankles (improved from prior).  Lymphedema present in the right arm, unchanged.Neuro:   Grossly intactPsych:  Normal mood and affectSkin:  Wound on the right upper back now with fully approximated edges  with some residual scabbing but fully closed.  Small seromas at the medial and lateral edges of the scar also improving.  Right axillary scar well healed without concerning nodularity.  LABS:Results for orders placed or performed during the hospital encounter of 07/31/19 MAGNESIUM Result Value Ref Range  Magnesium 1.7 1.7 - 2.4 mg/dL Phosphorus     (BH GH L LMW YH) Result Value Ref Range  Phosphorus 3.2 2.2 - 4.5 mg/dL Lactate dehydrogenase Result Value Ref Range  LD 263 (H) 122 - 241 U/L CK     (BH GH L LMW YH) Result Value Ref Range  Total CK 63 11 - 204 U/L TSH Result Value Ref Range  Thyroid Stimulating Hormone 5.500 (H) See Comment ?IU/mL T3 Result Value Ref Range  T3, Total 86.7 See Comment ng/dL T4, free Result Value Ref Range  Free T4 0.93 See Comment ng/dL PT/INR and PTT     (BH GH L YH) Result Value Ref Range  Prothrombin Time 10.9 9.9 - 11.5 seconds  INR 1.02 0.92 - 1.08  PTT 25.4 22.2 - 29.6 seconds CBC auto differential Result Value Ref Range  WBC 10.2 (H) 4.0 - 10.0 x1000/?L  RBC 5.1 3.8 - 5.9 M/?L  Hemoglobin 14.1 12.0 - 18.0 g/dL  Hematocrit 44.0 10.2 - 52.0 %  MCV 86.4 78.0 - 94.0 fL  MCHC 32.0 31.0 - 36.0 g/dL  RDW-CV 72.5 36.6 - 44.0 %  Platelets 333 140 - 440 x1000/?L  MPV 10.6 6.0 - 11.0 fL  ANC (Abs Neutrophil Count) 7.4 1.0 - 11.0 x 1000/?L  Neutrophils 72.5 37.0 - 84.0 %  Lymphocytes 17.5 8.0 - 49.0 %  Absolute Lymphocyte Count 1.8 1.0 - 4.0 x 1000/?L  Monocytes 6.8 4.0 - 15.0 %  Monocyte Absolute Count 0.7 0.0 - 2.0 x 1000/?L  Eosinophils 2.2 0.0 - 7.0 %  Eosinophil Absolute Count 0.2 0.0 - 1.0 x 1000/?L  Basophil 0.7 0.0 - 4.0 %  Basophil Absolute Count 0.1 (H) 0.0 - 0.0 x 1000/?L  Immature Granulocytes 0.3 0.0 - 3.0 %  Absolute Immature Granulocyte Count 0.0 0.0 - 0.3 x 1000/?L  nRBC 0.0 0.0 - 1.0 %  Absolute nRBC 0.0 0.0 - 0.0 x 1000/?L  MCH 27.7 27.0 - 31.0 pg Comprehensive metabolic panel Result Value Ref Range  Sodium 136 136 - 144 mmol/L  Potassium 4.5 3.3 - 5.1 mmol/L  Chloride 98 98 - 107 mmol/L  CO2 30 20 - 30 mmol/L  Anion Gap 8 7 - 17  Glucose 197 (H) 70 - 100 mg/dL  BUN 20 8 - 23 mg/dL  Creatinine 3.47 4.25 - 1.30 mg/dL  Calcium 9.5 8.8 - 95.6 mg/dL  BUN/Creatinine Ratio 38.7 (H) 8.0 - 23.0  Total Protein 8.3 6.6 - 8.7 g/dL  Albumin 3.9 3.6 - 4.9 g/dL  Total Bilirubin 0.4 <=5.6 mg/dL  Alkaline Phosphatase 433 9 - 122 U/L  Alanine Aminotransferase (ALT) 21 9 - 59 U/L  Aspartate Aminotransferase (AST) 21 10 - 35 U/L  Globulin 4.4 (H) 2.3 - 3.5 g/dL  A/G Ratio 0.9 (L) 1.0 - 2.2  AST/ALT Ratio 1.0 0.3 - 4.9  eGFR (Afr Amer) >60 >60 mL/min/1.2m2  eGFR (NON African-American) >60 >60 mL/min/1.53m2 IMAGING/PATH:[place] abd/pelvis 07/28/19:  NEDCT chest 07/28/19:  NED; new small bilateral pleural effusions.  Stable 4 mm LLL nodule.MRI Brain and [place] CAP 04/17/19:  NEDPET 02/07/19:Head And Neck: Base of tongue and preepiglottic FDG activity is nonspecific (SUV max 6.6).Mildly FDG avid cervical lymph nodes are nonspecific, for  example:*  Left level 2 ([place] image 874, 1.4 x 0.8 cm, SUV max 2.6)*  Right level 2 (Dresser image 878, 1.4 x 1 cm, SUV max 2.1)CHEST:Hypermetabolic mass overlying the right posterior 10th rib is consistent with malignancy, and involves the skin, subcutaneous fat, extending to the latissimus dorsi muscle (SUV max 15, 6.3 x 3 x 5.9 cm).Two hypermetabolic right axillary lymph nodes are highly suspicious for metastases (up to SUV max 8.3, 2.4 x 1.7 cm). Multiple additional smaller right subpectoral lymph nodes are minimally FDG avid, and are indeterminate (SUV max 1.2, subcentimeter).Azygos pulmonary fissure is incidentally noted. No suspicious pulmonary nodule is identified, although pulmonary detail is partially obscured by breathing motion. Coronary arteries and the aortic valve are calcified. The heart is on the upper bound of normal size. Nonspecific pattern of myocardial FDG activity. No mediastinal lymphadenopathy is identified.Abdomen/Pelvis: Diffuse hepatic and splenic heterogeneity most likely represents artifactual image noise, however this could conceal any small lesions.Multiple colonic diverticuli show no evidence of diverticulitis. Diffuse patchy colonic FDG activity is nonspecific, commonly inflammatory or related to a pharmacologic effect, however this could conceal any small lesions (SUV max 9 the sigmoid). Left renal cortical defect associated with a punctuate calcification likely represents scarring. Right renal calyceal calcifications likely represent nonobstructing stones (up to 0.5 cm).Normal appearance of the gallbladder, spleen, stomach, pancreas, adrenal glands, small bowel, decompressed urinary bladder.Streak artifacts arising from metallic right hip prostheses obscure nearby structures.Small retroperitoneal lymph nodes are not FDG avid, although below resolution of PET. Otherwise, no retroperitoneal, mesenteric, or pelvic lymphadenopathy is identified. Inguinal lymph nodes are nonspecific (SUV max 1.5), commonly inflammatory.MUSCULOSKELETAL:Heterogeneous marrow FDG activity is likely due to a combination of artifactual image noise, and marrow activation, however this reduces sensitivity for any small lesions (SUV max 4.6 in the sacrum). Right knee FDG activity associated with cruciate ligaments and synovium is likely degenerative or posttraumatic (SUV max 5), with small knee effusion (SUV max 3.3).IMPRESSION:1.  The accession number [id number] does not have any associated images in PACS at the time of dictation.  PET-[place] images associated with the accession number [id number] / [id number] were interpreted in this report.2.  Hypermetabolic mass overlying the right posterior 10th rib is consistent with malignancy, and involves the skin, subcutaneous fat, extending to the latissimus dorsi muscle.3.  Right axillary nodal metastases.4.  Multiple small right subpectoral lymph nodes are indeterminate; metastases are not excluded.5.  Base of tongue and preepiglottic FDG activity is nonspecific, with slight asymmetric fullness on the left. Direct visualization is recommended to exclude primary neoplasm.6.  Minimally FDG avid bilateral level 2 cervical lymph nodes are nonspecific, possibly inflammatory.MRI brain 01/30/19:There is no intracranial hemorrhage or major vascular distribution infarct.No abnormal enhancement is seen.There is no mass effect, edema, or midline shift.The ventricles are symmetric and normal in size. Several scattered foci of T2/FLAIR hyperintense signal are noted in the periventricular and subcortical white matter, likely sequela of chronic small vessel ischemic disease. No extra-axial collection is seen.Prior lens replacements noted bilaterally.The paranasal sinuses are clear. Small right mastoid effusion noted.IMPRESSION:No evidence of intracranial metastatic disease.WLE and SLN 03/03/19:LYMPH NODES, RIGHT AXILLARY LEVELS 1, 2 AND 3, LYMPH NODE DISSECTION: ?  ? ? - ?THREE OF THIRTY-FIVE LYMPH NODES, POSITIVE FOR MELANOMA (3 OF 35) WITH LARGEST INVOLVED LYMPH NODE MEASURING 4.5 X 3 CM ? ? ?- LYMPHATIC INVASION IDENTIFIED ? ? ?- NO EXTRANODAL EXTENSION IDENTIFIED 1. ?SKIN, BACK, RIGHT UPPER, EXCISION: ? ? ? ?- MALIGNANT MELANOMA, SEE NOTE AND SYNOPTIC SUMMARY Note: Sections show a  tumor composed of S100 positive epithelioid cells within the dermis and subcutaneous tissue. Cytokeratin AE1/AE3 and desmin are negative. A junctional component is not identified. The findings would be compatible with a primary dermal melanoma or a metastatic lesion. Clinical correlation is suggested. 2. ?SKIN, BACK, EXCISION: ? ? ? ?- BENIGN FIBROADIPOSE TISSUE AND SKELETAL MUSCLE SYNOPTIC SUMMARY MELANOMA OF THE SKIN Tumor Site: ? ? Back Laterality: ? ? Right Procedure: ? ? Primary excision Maximum Tumor Thickness (Depth): ? ? 30 mm Ulceration: ? ? Not identified Mitotic Rate (Mitoses/mm2): ? ? 2 Anatomic Level: ? ? V (Melanoma invades subcutis) Growth Phase: ? ? Vertical Histologic Type: ? ? Melanoma, NOS Tumor-Infiltrating Lymphocytes: ? ? Present, non-brisk Tumor Regression: ? ? Not identifed Lymphovascular Invasion: ? ? Present Microsatellitosis: ? ? Present Margins ? ? ?Peripheral Margins: ? ? Uninvolved Deep Margin: ? ? Uninvolved Stage (AJCC 8th Ed): ? ? pT4a Nx Base of the tongue and vallecula random biopsies 02/20/19: 1. ?TONGUE, LEFT, BASE, BIOPSY: ? ? ? ?- LYMPHOID TISSUE HYPERPLASIA ? ? ?- NEGATIVE FOR MALIGNANCY 2. ?THROAT, VALLECULA, BIOPSY: ? ?  ? - LYMPHOID TISSUE HYPERPLASIA AND FOCAL CHRONIC INFLAMMATION. SEE NOTE. ? ? ?- NEGATIVE FOR MALIGNANCY ? ? ? ? ? Note: Immunostain of Sox-10 to investigate focal pigment is negative, supporting above interpretation. 3. ?THROAT, LEFT VALLECULA, BIOPSY: ? ? ? ?- LYMPHOID TISSUE HYPERPLASIA ? ? ?- NEGATIVE FOR MALIGNANCY 4. ?THROAT, RIGHT VALLECULA, BIOPSY: ? ? ? ? ? ? - LYMPHOID TISSUE HYPERPLASIA AND FOCAL CHRONIC INFLAMMATION ? ? ?- NEGATIVE FOR MALIGNANCY 5. ?TONGUE, RIGHT BASE, BIOPSY: ? ? ? ? ? ? - LYMPHOID TISSUE HYPERPLASIA AND FOCAL CHRONIC INFLAMMATION ? ? ?- NEGATIVE FOR MALIGNANCY Pathology (reviewed at [facility]), collected 01/07/19:DIAGNOSIS: ? 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 Clinical pathological correlation is recommended. MICROSCOPIC DESCRIPTION: There are atypical cells in a nodule in the dermis. The cells are positive with SOX-10 and by report are negative with LCA, Kappa, Lambda, and a cytokeratin. The cells are also positive with S-100 by report.ASSESSMENT/PLAN:Jason Rowland is a 74 y.o. 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  Initially path read as metastatic melanoma, but further workup confirms that this is the primary site, not a metastatic lesion.  Initial biopsy was likely altered due to repeat I&Ds of the area.  He had a FBSE with his dermatologist and no additional primary lesions were identified.  He is otherwise in good health except for metabolic syndrome and diabetes-associated neuropathy.  PET 02/07/19 showed the main lesion on the right upper back extending to the latissimus dorsi muscle with involved right axillary nodes and nonspecific avidity in the BOT and preepiglottis for which direct visual evaluation was recommended and done 02/11/19 and random biopsies 02/20/19 without concerns for malignancy. Colonoscopy was also done to evaluate stranding in the transverse colon, which was negative for malignancy on exam.  Now s/p WLE and LND with Dr. Duanne Moron on 03/03/19 showing a stage IIIC melanoma that was 30 mm thick, positive for LVI and microsatellites with 3/25 positive LNs.  Given the thickness of his primary melanoma and microsatellitosis, we felt that his melanoma-specific survival is closer to a stage IIID:  60% over 10 years with stage IIIC disease versus 24% with stage IIID.  He does not have a BRAF mutation,  so SOC options are immune therapy at this point versus the Moderna clinical trial (?A Phase 2 Randomized Study of Adjuvant Immunotherapy with the Personalized Cancer Vaccine [phone] and Pembrolizumab Versus Pembrolizumab Alone After Complete Resection of High-Risk Melanoma?) vs active surveillance (for which he would be extremely high risk for relapsing).  He opted for the Moderna trial and signed consent on 03/27/19; tissue passed QC inspection.  Jason Rowland was randomized to the [facility] with vaccine arm of the trial.  He has experienced some weight loss due to loss of taste/smell/appetite after initiation the clinical trial, but his albumin is normal, and his diabetes is under better control.  He knows to focus on dietary protein and eat scheduled meals despite a lack of appetite.  He has stable right arm lymphedema but declines any lymphedema therapy or sleeves.  He also had some G2 diarrhea after C4, which resolved with Imodium.  LFTs have fully normalized today.- labs reviewed today.  LDH elevated in the setting of negative surveillance scans.  LFTs wnl.  Bilis are normal.  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  Ok for treatment C5 today. Will get pembrolizumab with vaccine.- taking guaifenesin for cough- [place] CAP 4/12 NED; surveillance scans every 12 weeks- will need a COVID test 3-5 days post his return from [facility] per [place] [facility] guidelines.  His next COVID vaccine Gala Murdoch) may conflict with his next appt; will need to speak with the trial sponsor re scheduling of next visit to not be to close to his 2nd dose.- routine f/u with dermatologist every 4-6 months.  Last seen 04/2019; he reports having an appt coming up soon.- RTC in 3 weeks per trial schedule if able to treat today; instructed to call if any issues or questions in the interim30 min were spent in direct patient contact.  He has our clinic contact information and instructed to call if questions.  Drema Halon, MD, PhDMedical Oncology

## 2019-08-01 NOTE — Progress Notes
HIC #?[id number]?Cycle?5, Day 1?Patient seen and examined by?Drema Halon, MD.Labs and toxicity assessment reviewed?by Drema Halon, MD. ?All labs are deemed NCS today, Concomitant medications reviewed and updated.Approved for treatment?for pembro and vaccine dose?today per??Drema Halon, MD.The calendar was reviewed with the patient, and they will RTC?per protocol.?ECOG PS = 1??Concomitant medications:?MEDICATION SINGLE DOSE UNITS ROUTE FREQUECY START DATE STOP DATE INDICATION Aspirin 81 mg PO QD 2010 ongoing PPX CVD Simvastatin 20 Mg PO QD 2012 ongoing Hypercholesterolemia ?Ambien 10 mg PO QD 2016 ongoing insomnia ?Glucoside 10 mg PO QD 2014 ongoing diabetes ?Toujeo Solostar ?80 Units SQ QD ?2016 ?07/08/19 diabetes ?guaifenesin 400 mg PO q 4 hours prn 07/10/19 ongoing cough ?imodium 2 Mg PO PRN 07/19/19 ongoing diarrhea Toujeo Solostar 40-50 units subcutaneous QD 07/09/19 ongoing diabetes ? ? ? ? ? ? ? ? ? ? ? ? ? ? ? ? ? ? ? ? ? ? ? ? ??Medical History:?? ? ? ? ? ? ? ? ? ? ? ? ? ? ? ? ? ? ? ? ? ? ? ? ? ? ? ? PATIENT NAME:?Jason Rowland??STUDY ID: ?[id number] ? ? 0=NA ?????????????????????1= Unrelated ????????2= Unlikely 3=Possible ????????4= Probable ??????????5. Definite (Circle One) 0= No Action Taken ????????????????????????????????????????1= Con Med ???????????????????????????????????????????????????????????2=Dose Modified ???????????????????????????????????????3=Dose Delayed ????????????????????????????????????????????????????????4= Patient Hospitalized ?????????????????????????????????????5= Patient Taken Off Study ???????????????????6=Non Drug Therapy ???????????????????????????????????????7=Other (Specify) 1=Recovered ??????????????????????2=Still under txmt/observation ?????????????????????????????3=Alive w/Sequelae ????????????????????????????????????????????????????????????????4=Died ????????????????????????5=Resolvd to Baseline ????????????6=Grade Change ? ? ? Medical History ? ? ? ? ? ? ? ? ? ? ? ADVERSE EVENT  Is AE Intermittent? Y/N SAE ????????????Y/N Grade Expect?PEMBRO?Y/N ?Relation to?PEMBRO Expect?mRNA????????????Y/N ?Relation to?mRNA Start Date End Date/ or ?Ongoing Action Taken ??????????????(Select # above) AE OUTCOME ? Hypercholesterolemia N N 1 Med Hx Med Hx Med Hx Med Hx 2012 ongoing 1=?simvastatin 2 ? diabetes N N 1 Med Hx Med Hx Med Hx Med Hx 2010 ongoing 1-?glucoside; toujeo 2 ? Insomnia N N 1 Med Hx Med Hx Med Hx Med Hx 2016 ongoing 1- ambien 2 ? Edema?limbs N N 1 Med Hx? Med Hx Med Hx Med Hx ?03/03/2019 ongoing 0 2 ? Diarrhea? N N 1 Y? 1 ?N/A ?N/A ?05/27/2019 05/27/2019 0 5 ? fatigue? ?N N? 1? ?Y 3? ?Y 3? ?06/26/19 ?ongoing ?0 2? ? dysgeusia N N 1 N 2 N 2 06/22/19 ongoing 0 2 ? anorexia N N 1 Y 2 Y 2 06/22/19 ongoing 0 2 ? Pain, injection site Y N 1 N 1 Y 4 06/19/19 06/23/19 0 2 ? Eye disorders, other (left eye droop) N N 1 N 2 N 2 07/01/19 07/31/19 0 2 ? vomiting N N 1 Y 3 Y 3 07/08/19 07/08/19 0 1 ? Cathlyn Parsons 1 Y 3 Y 3 07/08/19 07/08/19 0 2 ? cough N N 1 Y 3 Y 3 07/08/19 ongoing 1 = gueifenisen 2 ? ALT increased N N 1 Y 3 Y 3 07/10/19 07/31/19 0 2 ? AST increased  N N 1 Y 3 Y 3 07/10/19 07/31/19 0 2 ? Alkaline phos increased N N 2 Y 3 Y 3 07/10/19 07/31/19 0 2 ? Diarrhea? Y N 2 Y 3 Y 3 07/09/19 07/20/2019 1- imodium 1 ? Injection site pain Y N 1 N 1 Y 3 07/08/19 ongoing 0 2  bloating Y N 1 N 1 N 1 07/08/19 ongoing 0 2  Bilateral foot neuropathy N N 1 N/A N/A N/A N/A Baseline 02/2019 ongoing 0 2  Dyspnea on exertion Y N 1 N/A N/A N/A N/A Baseline 02/2019 ongoing 0 2                            ??  Labs:Results for NAVARRE, DIANA (MRN [id number])  Ref. Range 07/31/2019 10:39 Sodium Latest Ref Range: 136 - 144 mmol/L 136 Potassium Latest Ref Range: 3.3 - 5.1 mmol/L 4.5 Chloride Latest Ref Range: 98 - 107 mmol/L 98 CO2 Latest Ref Range: 20 - 30 mmol/L 30 Anion Gap Latest Ref Range: 7 - 17  8 BUN Latest Ref Range: 8 - 23 mg/dL 20 Creatinine Latest Ref Range: 0.40 - 1.30 mg/dL 4.09 BUN/Creatinine Ratio Latest Ref Range: 8.0 - 23.0  25.3 (H) eGFR (NON African-American) Latest Ref Range: >60 mL/min/1.43m2 >60 eGFR (Afr Amer) Latest Ref Range: >60 mL/min/1.68m2 >60 Glucose Latest Ref Range: 70 - 100 mg/dL 811 (H) Calcium Latest Ref Range: 8.8 - 10.2 mg/dL 9.5 Magnesium Latest Ref Range: 1.7 - 2.4 mg/dL 1.7 Phosphorus Latest Ref Range: 2.2 - 4.5 mg/dL 3.2 Total Bilirubin Latest Ref Range: <=1.2 mg/dL 0.4 Alkaline Phosphatase Latest Ref Range: 9 - 122 U/L 116 Alanine Aminotransferase (ALT) Latest Ref Range: 9 - 59 U/L 21 Aspartate Aminotransferase (AST) Latest Ref Range: 10 - 35 U/L 21 AST/ALT Ratio Latest Ref Range: 0.3 - 4.9  1.0 LD Latest Ref Range: 122 - 241 U/L 263 (H) Total Protein Latest Ref Range: 6.6 - 8.7 g/dL 8.3 Albumin Latest Ref Range: 3.6 - 4.9 g/dL 3.9 Globulin Latest Ref Range: 2.3 - 3.5 g/dL 4.4 (H) A/G Ratio Latest Ref Range: 1.0 - 2.2  0.9 (L) Total CK Latest Ref Range: 11 - 204 U/L 63 T3, Total Latest Ref Range: See Comment ng/dL 91.4 Thyroid Stimulating Hormone, 3rd Gen. Latest Ref Range: See Comment ?IU/mL 5.500 (H) Free T4 Latest Ref Range: See Comment ng/dL 7.82 WBC Latest Ref Range: 4.0 - 10.0 x1000/?L 10.2 (H) RBC Latest Ref Range: 3.8 - 5.9 M/?L 5.1 Hemoglobin Latest Ref Range: 12.0 - 18.0 g/dL 95.6 Hematocrit Latest Ref Range: 37.0 - 52.0 % 44.0 MCV Latest Ref Range: 78.0 - 94.0 fL 86.4 MCH Latest Ref Range: 27.0 - 31.0 pg 27.7 MCHC Latest Ref Range: 31.0 - 36.0 g/dL 21.3 RDW-CV Latest Ref Range: 11.5 - 14.5 % 13.7 nRBC Latest Ref Range: 0.0 - 1.0 % 0.0 Platelets Latest Ref Range: 140 - 440 x1000/?L 333 MPV Latest Ref Range: 6.0 - 11.0 fL 10.6 Neutrophils Latest Ref Range: 37.0 - 84.0 % 72.5 Lymphocytes Latest Ref Range: 8.0 - 49.0 % 17.5 Monocytes Latest Ref Range: 4.0 - 15.0 % 6.8 Eosinophils Latest Ref Range: 0.0 - 7.0 % 2.2 Basophils Latest Ref Range: 0.0 - 4.0 % 0.7 ANC (Abs Neutrophil Count) Latest Ref Range: 1.0 - 11.0 x 1000/?L 7.4 Absolute Lymphocyte Count Latest Ref Range: 1.0 - 4.0 x 1000/?L 1.8 Monocytes (Absolute) Latest Ref Range: 0.0 - 2.0 x 1000/?L 0.7 Eosinophil Absolute Count Latest Ref Range: 0.0 - 1.0 x 1000/?L 0.2 Basophils Absolute Latest Ref Range: 0.0 - 0.0 x 1000/?L 0.1 (H) Immature Granulocytes (Abs) Latest Ref Range: 0.0 - 0.3 x 1000/?L 0.0 nRBC Absolute Latest Ref Range: 0.0 - 0.0 x 1000/?L 0.0 Immature Granulocytes Latest Ref Range: 0.0 - 3.0 % 0.3 Prothrombin Time Latest Ref Range: 9.9 - 11.5 seconds 10.9 INR Latest Ref Range: 0.92 - 1.08  1.02 PTT Latest Ref Range: 22.2 - 29.6 seconds 25.4

## 2019-08-08 ENCOUNTER — Encounter: Admit: 2019-08-08 | Payer: PRIVATE HEALTH INSURANCE

## 2019-08-16 ENCOUNTER — Ambulatory Visit: Admit: 2019-08-16 | Payer: PRIVATE HEALTH INSURANCE

## 2019-08-18 ENCOUNTER — Inpatient Hospital Stay: Admit: 2019-08-18 | Discharge: 2019-08-18 | Payer: BLUE CROSS/BLUE SHIELD

## 2019-08-18 DIAGNOSIS — Z006 Encounter for examination for normal comparison and control in clinical research program: Secondary | ICD-10-CM

## 2019-08-18 DIAGNOSIS — C4359 Malignant melanoma of other part of trunk: Secondary | ICD-10-CM

## 2019-08-18 DIAGNOSIS — Z789 Other specified health status: Secondary | ICD-10-CM

## 2019-08-19 DIAGNOSIS — Z20822 Contact with and (suspected) exposure to covid-19: Secondary | ICD-10-CM

## 2019-08-19 LAB — SARS COV-2 (COVID-19) RNA- REFERENCE LAB (BH GH LMW Q YH): BKR SARS-COV-2 RNA (COVID-19) (YH): NOT DETECTED

## 2019-08-21 ENCOUNTER — Encounter: Admit: 2019-08-21 | Payer: PRIVATE HEALTH INSURANCE | Attending: Medical Oncology

## 2019-08-21 ENCOUNTER — Ambulatory Visit: Admit: 2019-08-21 | Payer: PRIVATE HEALTH INSURANCE

## 2019-08-21 ENCOUNTER — Inpatient Hospital Stay: Admit: 2019-08-21 | Discharge: 2019-08-21 | Payer: BLUE CROSS/BLUE SHIELD

## 2019-08-21 ENCOUNTER — Encounter: Admit: 2019-08-21 | Payer: PRIVATE HEALTH INSURANCE

## 2019-08-21 ENCOUNTER — Ambulatory Visit: Admit: 2019-08-21 | Payer: BLUE CROSS/BLUE SHIELD | Attending: Medical-Surgical

## 2019-08-21 DIAGNOSIS — Z7982 Long term (current) use of aspirin: Secondary | ICD-10-CM

## 2019-08-21 DIAGNOSIS — E1140 Type 2 diabetes mellitus with diabetic neuropathy, unspecified: Secondary | ICD-10-CM

## 2019-08-21 DIAGNOSIS — Z8051 Family history of malignant neoplasm of kidney: Secondary | ICD-10-CM

## 2019-08-21 DIAGNOSIS — C4359 Malignant melanoma of other part of trunk: Secondary | ICD-10-CM

## 2019-08-21 DIAGNOSIS — E785 Hyperlipidemia, unspecified: Secondary | ICD-10-CM

## 2019-08-21 DIAGNOSIS — Z794 Long term (current) use of insulin: Secondary | ICD-10-CM

## 2019-08-21 DIAGNOSIS — Z006 Encounter for examination for normal comparison and control in clinical research program: Secondary | ICD-10-CM

## 2019-08-21 DIAGNOSIS — Z806 Family history of leukemia: Secondary | ICD-10-CM

## 2019-08-21 DIAGNOSIS — E7800 Pure hypercholesterolemia, unspecified: Secondary | ICD-10-CM

## 2019-08-21 DIAGNOSIS — Z5112 Encounter for antineoplastic immunotherapy: Secondary | ICD-10-CM

## 2019-08-21 DIAGNOSIS — Z955 Presence of coronary angioplasty implant and graft: Secondary | ICD-10-CM

## 2019-08-21 DIAGNOSIS — Z79899 Other long term (current) drug therapy: Secondary | ICD-10-CM

## 2019-08-21 DIAGNOSIS — Z8052 Family history of malignant neoplasm of bladder: Secondary | ICD-10-CM

## 2019-08-21 LAB — COMPREHENSIVE METABOLIC PANEL
BKR A/G RATIO: 0.9 — ABNORMAL LOW (ref 1.0–2.2)
BKR ALANINE AMINOTRANSFERASE (ALT): 29 U/L (ref 9–59)
BKR ALBUMIN: 3.8 g/dL (ref 3.6–4.9)
BKR ALKALINE PHOSPHATASE: 111 U/L (ref 9–122)
BKR ANION GAP: 9 (ref 7–17)
BKR ASPARTATE AMINOTRANSFERASE (AST): 25 U/L (ref 10–35)
BKR AST/ALT RATIO: 0.9
BKR BILIRUBIN TOTAL: 0.4 mg/dL (ref <=1.2)
BKR BLOOD UREA NITROGEN: 13 mg/dL (ref 8–23)
BKR BUN / CREAT RATIO: 17.3 (ref 8.0–23.0)
BKR CALCIUM: 9.2 mg/dL (ref 8.8–10.2)
BKR CHLORIDE: 102 mmol/L (ref 98–107)
BKR CO2: 29 mmol/L (ref 20–30)
BKR CREATININE: 0.75 mg/dL (ref 0.40–1.30)
BKR EGFR (AFR AMER): >60 mL/min/{1.73_m2} (ref >60)
BKR EGFR (NON AFRICAN AMERICAN): >60 mL/min/{1.73_m2} (ref >60)
BKR GLOBULIN: 4.4 g/dL — ABNORMAL HIGH (ref 2.3–3.5)
BKR GLUCOSE: 169 mg/dL — ABNORMAL HIGH (ref 70–100)
BKR POTASSIUM: 4.6 mmol/L (ref 3.3–5.1)
BKR PROTEIN TOTAL: 8.2 g/dL (ref 6.6–8.7)
BKR SODIUM: 140 mmol/L (ref 136–144)

## 2019-08-21 LAB — CBC WITH AUTO DIFFERENTIAL
BKR WAM ABSOLUTE IMMATURE GRANULOCYTES: 0.1 x 1000/ÂµL (ref 0.0–0.3)
BKR WAM ABSOLUTE LYMPHOCYTE COUNT: 1.7 x 1000/ÂµL (ref 1.0–4.0)
BKR WAM ABSOLUTE NRBC: 0 x 1000/ÂµL (ref 0.0–0.0)
BKR WAM ANALYZER ANC: 7 x 1000/ÂµL (ref 1.0–11.0)
BKR WAM BASOPHIL ABSOLUTE COUNT: 0.1 x 1000/ÂµL — ABNORMAL HIGH (ref 0.0–0.0)
BKR WAM BASOPHILS: 0.8 % (ref 0.0–4.0)
BKR WAM EOSINOPHIL ABSOLUTE COUNT: 0.2 x 1000/ÂµL (ref 0.0–1.0)
BKR WAM EOSINOPHILS: 2.4 % (ref 0.0–7.0)
BKR WAM HEMATOCRIT: 42.3 % (ref 37.0–52.0)
BKR WAM HEMOGLOBIN: 13.6 g/dL (ref 12.0–18.0)
BKR WAM IMMATURE GRANULOCYTES: 0.5 % (ref 0.0–3.0)
BKR WAM LYMPHOCYTES: 17.2 % (ref 8.0–49.0)
BKR WAM MCH (PG): 27.8 pg (ref 27.0–31.0)
BKR WAM MCHC: 32.2 g/dL (ref 31.0–36.0)
BKR WAM MCV: 86.5 fL (ref 78.0–94.0)
BKR WAM MONOCYTE ABSOLUTE COUNT: 0.6 x 1000/ÂµL (ref 0.0–2.0)
BKR WAM MONOCYTES: 6.6 % (ref 4.0–15.0)
BKR WAM MPV: 10.8 fL (ref 6.0–11.0)
BKR WAM NEUTROPHILS: 72.5 % (ref 37.0–84.0)
BKR WAM NUCLEATED RED BLOOD CELLS: 0 % (ref 0.0–1.0)
BKR WAM PLATELETS: 328 x1000/ÂµL (ref 140–440)
BKR WAM RDW-CV: 14 % (ref 11.5–14.5)
BKR WAM RED BLOOD CELL COUNT: 4.9 M/ÂµL (ref 3.8–5.9)
BKR WAM WHITE BLOOD CELL COUNT: 9.7 x1000/ÂµL (ref 4.0–10.0)

## 2019-08-21 LAB — CK     (BH GH L LMW YH): BKR CREATINE KINASE TOTAL: 71 U/L (ref 11–204)

## 2019-08-21 LAB — URINALYSIS-MACROSCOPIC W/REFLEX MICROSCOPIC
BKR BILIRUBIN, UA: NEGATIVE
BKR BLOOD, UA: NEGATIVE
BKR GLUCOSE, UA: NEGATIVE
BKR KETONES, UA: NEGATIVE
BKR LEUKOCYTE ESTERASE, UA: NEGATIVE
BKR NITRITE, UA: NEGATIVE
BKR PH, UA: 6 (ref 5.5–7.5)
BKR SPECIFIC GRAVITY, UA: 1.015 (ref 1.005–1.020)
BKR UROBILINOGEN, UA: 0.2 EU/dL (ref <=2.0)

## 2019-08-21 LAB — PHOSPHORUS     (BH GH L LMW YH): BKR PHOSPHORUS: 2.7 mg/dL (ref 2.2–4.5)

## 2019-08-21 LAB — PT/INR AND PTT (BH GH L LMW YH)
BKR INR: 1.06 (ref 0.92–1.08)
BKR PARTIAL THROMBOPLASTIN TIME: 26.5 s (ref 22.2–29.6)
BKR PROTHROMBIN TIME: 11.3 s (ref 9.9–11.5)

## 2019-08-21 LAB — URINE MICROSCOPIC     (BH GH LMW YH)

## 2019-08-21 LAB — MAGNESIUM: BKR MAGNESIUM: 1.8 mg/dL (ref 1.7–2.4)

## 2019-08-21 LAB — LACTATE DEHYDROGENASE: BKR LACTATE DEHYDROGENASE: 139 U/L (ref 122–241)

## 2019-08-21 MED ORDER — DIPHENHYDRAMINE 50 MG/ML INJECTION SOLUTION
50 mg/mL | Freq: Once | INTRAVENOUS | Status: DC | PRN
Start: 2019-08-21 — End: 2019-08-26

## 2019-08-21 MED ORDER — HIC 2000025461 (MRNA-4157)
Freq: Once | INTRAMUSCULAR | Status: CP
Start: 2019-08-21 — End: ?
  Administered 2019-08-21: 17:00:00 1.000 mL via INTRAMUSCULAR

## 2019-08-21 MED ORDER — MEPERIDINE 25 MG/2.5 ML IN 0.9% SODIUM CHLORIDE
Freq: Once | INTRAVENOUS | Status: DC | PRN
Start: 2019-08-21 — End: 2019-08-26

## 2019-08-21 MED ORDER — EPINEPHRINE 0.3 MG/0.3 ML INJECTION, AUTO-INJECTOR
0.3 mg/ mL | INTRAMUSCULAR | Status: DC | PRN
Start: 2019-08-21 — End: 2019-08-26

## 2019-08-21 MED ORDER — ALBUTEROL SULFATE 2.5 MG/3 ML (0.083 %) SOLUTION FOR NEBULIZATION
2.5 mg /3 mL (0.083 %) | RESPIRATORY_TRACT | Status: DC | PRN
Start: 2019-08-21 — End: 2019-08-26

## 2019-08-21 MED ORDER — SODIUM CHLORIDE 0.9 % BOLUS (NEW BAG)
0.9 % | Freq: Once | INTRAVENOUS | Status: DC | PRN
Start: 2019-08-21 — End: 2019-08-26

## 2019-08-21 MED ORDER — HIC 2000025461 PEMBROLIZUMAB (MK-3475)
Freq: Once | INTRAVENOUS | Status: CP
Start: 2019-08-21 — End: ?
  Administered 2019-08-21: 17:00:00 100.000 mL/h via INTRAVENOUS

## 2019-08-21 MED ORDER — HYDROCORTISONE SODIUM SUCCINATE 100 MG SOLUTION FOR INJECTION
100 mg | Freq: Once | INTRAVENOUS | Status: DC | PRN
Start: 2019-08-21 — End: 2019-08-26

## 2019-08-21 MED ORDER — FAMOTIDINE 4 MG/ML IN STERILE WATER (ADULT)
Freq: Once | INTRAVENOUS | Status: DC | PRN
Start: 2019-08-21 — End: 2019-08-26

## 2019-08-21 NOTE — Progress Notes
HIC #?[id number]?Cycle?6, Day 1?Patient seen and examined by?Marcell Anger, APRNLabs and toxicity assessment reviewed?by Marcell Anger, APRNAll labs are deemed NCS today, Concomitant medications reviewed and updated.Approved for treatment?for pembro and vaccine dose?today per??Marcell Anger, APRN.The calendar was reviewed with the patient, and they will RTC?per protocol.??ECOG PS = 1??Concomitant medications:?MEDICATION SINGLE DOSE UNITS ROUTE FREQUECY START DATE STOP DATE INDICATION Aspirin 81 mg PO QD 2010 ongoing PPX CVD Simvastatin 20 Mg PO QD 2012 ongoing Hypercholesterolemia ?Ambien 10 mg PO QD 2016 ongoing insomnia ?Glucoside 10 mg PO QD 2014 ongoing diabetes ?Toujeo Solostar ?80 Units SQ QD ?2016 ?07/08/19 diabetes ?guaifenesin 400 mg PO q 4 hours prn 07/10/19 08/16/19 cough ?imodium 2 Mg PO PRN 07/19/19 ongoing diarrhea Toujeo Solostar 40-50 units subcutaneous QD 07/09/19 ongoing diabetes ? ? ? ? ? ? ? ? ? ? ? ? ? ? ? ? ? ? ? ? ? ? ? ? ??Medical History:?? ? ? ? ? ? ? ? ? ? ? ? ? ? ? ? ? ? ? ? ? ? ? ? ? ? ? ? PATIENT NAME:?Jason Rowland??STUDY ID: ?[id number] ? ? 0=NA ?????????????????????1= Unrelated ????????2= Unlikely 3=Possible ????????4= Probable ??????????5. Definite (Circle One) 0= No Action Taken ????????????????????????????????????????1= Con Med ???????????????????????????????????????????????????????????2=Dose Modified ???????????????????????????????????????3=Dose Delayed ????????????????????????????????????????????????????????4= Patient Hospitalized ?????????????????????????????????????5= Patient Taken Off Study ???????????????????6=Non Drug Therapy ???????????????????????????????????????7=Other (Specify) 1=Recovered ??????????????????????2=Still under txmt/observation ?????????????????????????????3=Alive w/Sequelae ????????????????????????????????????????????????????????????????4=Died ????????????????????????5=Resolvd to Baseline ????????????6=Grade Change ? ? ? Medical History ? ? ? ? ? ? ? ? ? ? ? ADVERSE EVENT  Is AE Intermittent? Y/N SAE ????????????Y/N Grade Expect?PEMBRO?Y/N ?Relation to?PEMBRO Expect?mRNA????????????Y/N ?Relation to?mRNA Start Date End Date/ or ?Ongoing Action Taken ??????????????(Select # above) AE OUTCOME ? Hypercholesterolemia N N 1 Med Hx Med Hx Med Hx Med Hx 2012 ongoing 1=?simvastatin 2 ? diabetes N N 1 Med Hx Med Hx Med Hx Med Hx 2010 ongoing 1-?glucoside; toujeo 2 ? Insomnia N N 1 Med Hx Med Hx Med Hx Med Hx 2016 ongoing 1- ambien 2 ? Edema?limbs N N 1 Med Hx? Med Hx Med Hx Med Hx ?03/03/2019 ongoing 0 2 ? Diarrhea? N N 1 Y? 1 ?N/A ?N/A ?05/27/2019 05/27/2019 0 5 ? fatigue? ?N N? 1? ?Y 3? ?Y 3? ?06/26/19 ?ongoing ?0 2? ? dysgeusia N N 1 N 2 N 2 06/22/19 ongoing 0 2 ? anorexia N N 1 Y 2 Y 2 06/22/19 ongoing 0 2 ? Pain, injection site Y N 1 N 1 Y 4 06/19/19 06/23/19 0 2 ? Eye disorders, other (left eye droop) N N 1 N 2 N 2 07/01/19 07/31/19 0 2 ? vomiting N N 1 Y 3 Y 3 07/08/19 07/08/19 0 1 ? Cathlyn Parsons 1 Y 3 Y 3 07/08/19 07/08/19 0 2 ? cough N N 1 Y 3 Y 3 07/08/19 08/16/19 1 = gueifenisen 2 ? ALT increased N N 1 Y 3 Y 3 07/10/19 07/31/19 0 2 ? AST increased  N N 1 Y 3 Y 3 07/10/19 07/31/19 0 2 ? Alkaline phos increased N N 2 Y 3 Y 3 07/10/19 07/31/19 0 2 ? Diarrhea? Y N 2 Y 3 Y 3 07/09/19 07/20/2019 1- imodium 1 ? Injection site pain Y N 1 N 1 Y 3 07/08/19 ongoing 0 2 ? bloating Y N 1 N 1 N 1 07/08/19 ongoing 0 2 ? Bilateral foot neuropathy N N 1 N/A N/A N/A N/A Baseline 02/2019 ongoing 0 2 ? Dyspnea on exertion Y N 1 N/A N/A N/A N/A Baseline 02/2019 ongoing 0 2 ? ? ? ? ? ? ? ? ? ? ? ? ? ? ? ? ? ? ? ? ? ? ? ? ? ? ? ??  Labs:Results for TEVITA, GOMER (MRN [id number])  Ref. Range 08/21/2019 10:48 08/21/2019 10:57 Sodium Latest Ref Range: 136 - 144 mmol/L 140  Potassium Latest Ref Range: 3.3 - 5.1 mmol/L 4.6  Chloride Latest Ref Range: 98 - 107 mmol/L 102  CO2 Latest Ref Range: 20 - 30 mmol/L 29  Anion Gap Latest Ref Range: 7 - 17  9  BUN Latest Ref Range: 8 - 23 mg/dL 13  Creatinine Latest Ref Range: 0.40 - 1.30 mg/dL 1.91  BUN/Creatinine Ratio Latest Ref Range: 8.0 - 23.0  17.3  eGFR (NON African-American) Latest Ref Range: >60 mL/min/1.18m2 >60  eGFR (Afr Amer) Latest Ref Range: >60 mL/min/1.110m2 >60  Glucose Latest Ref Range: 70 - 100 mg/dL 478 (H)  Calcium Latest Ref Range: 8.8 - 10.2 mg/dL 9.2  Magnesium Latest Ref Range: 1.7 - 2.4 mg/dL 1.8  Phosphorus Latest Ref Range: 2.2 - 4.5 mg/dL 2.7  Total Bilirubin Latest Ref Range: <=1.2 mg/dL 0.4  Alkaline Phosphatase Latest Ref Range: 9 - 122 U/L 111  Alanine Aminotransferase (ALT) Latest Ref Range: 9 - 59 U/L 29  Aspartate Aminotransferase (AST) Latest Ref Range: 10 - 35 U/L 25  AST/ALT Ratio Latest Ref Range: See Comment  0.9  LD Latest Ref Range: 122 - 241 U/L 139  Total Protein Latest Ref Range: 6.6 - 8.7 g/dL 8.2  Albumin Latest Ref Range: 3.6 - 4.9 g/dL 3.8  Globulin Latest Ref Range: 2.3 - 3.5 g/dL 4.4 (H)  A/G Ratio Latest Ref Range: 1.0 - 2.2  0.9 (L)  Total CK Latest Ref Range: 11 - 204 U/L 71  WBC Latest Ref Range: 4.0 - 10.0 x1000/?L 9.7  RBC Latest Ref Range: 3.8 - 5.9 M/?L 4.9  Hemoglobin Latest Ref Range: 12.0 - 18.0 g/dL 29.5  Hematocrit Latest Ref Range: 37.0 - 52.0 % 42.3  MCV Latest Ref Range: 78.0 - 94.0 fL 86.5  MCH Latest Ref Range: 27.0 - 31.0 pg 27.8  MCHC Latest Ref Range: 31.0 - 36.0 g/dL 62.1  RDW-CV Latest Ref Range: 11.5 - 14.5 % 14.0  nRBC Latest Ref Range: 0.0 - 1.0 % 0.0  Platelets Latest Ref Range: 140 - 440 x1000/?L 328  MPV Latest Ref Range: 6.0 - 11.0 fL 10.8  Neutrophils Latest Ref Range: 37.0 - 84.0 % 72.5  Lymphocytes Latest Ref Range: 8.0 - 49.0 % 17.2  Monocytes Latest Ref Range: 4.0 - 15.0 % 6.6  Eosinophils Latest Ref Range: 0.0 - 7.0 % 2.4  Basophils Latest Ref Range: 0.0 - 4.0 % 0.8  ANC (Abs Neutrophil Count) Latest Ref Range: 1.0 - 11.0 x 1000/?L 7.0  Absolute Lymphocyte Count Latest Ref Range: 1.0 - 4.0 x 1000/?L 1.7  Monocytes (Absolute) Latest Ref Range: 0.0 - 2.0 x 1000/?L 0.6  Eosinophil Absolute Count Latest Ref Range: 0.0 - 1.0 x 1000/?L 0.2  Basophils Absolute Latest Ref Range: 0.0 - 0.0 x 1000/?L 0.1 (H)  Immature Granulocytes (Abs) Latest Ref Range: 0.0 - 0.3 x 1000/?L 0.1  nRBC Absolute Latest Ref Range: 0.0 - 0.0 x 1000/?L 0.0  Immature Granulocytes Latest Ref Range: 0.0 - 3.0 % 0.5  Prothrombin Time Latest Ref Range: 9.9 - 11.5 seconds 11.3  INR Latest Ref Range: 0.92 - 1.08  1.06  PTT Latest Ref Range: 22.2 - 29.6 seconds 26.5  Clarity, UA Latest Ref Range: Clear   Clear Specific Gravity, UA Latest Ref Range: 1.005 - 1.020   1.015 pH, UA Latest Ref Range: 5.5 - 7.5   6.0 Protein,  UA Latest Ref Range: Negative-Trace   2+ (A) Glucose, UA Latest Ref Range: Negative   Negative Ketones, UA Latest Ref Range: Negative   Negative Blood, UA Latest Ref Range: Negative   Negative Bilirubin, UA Latest Ref Range: Negative   Negative Leukocytes, UA Latest Ref Range: Negative   Negative Nitrite, UA Latest Ref Range: Negative   Negative Color, UA Latest Ref Range: Yellow   Yellow Urobilinogen, UA Latest Ref Range: <=2.0 EU/dL  0.2 ?

## 2019-08-21 NOTE — Progress Notes
Re: Ivor Kishi (1945/10/12)MRN: [id number] Provider: Theron Arista, APRNDate of service: 5/6/2021PROGRESS Star Cheese is a 74 y.o. male Invasive melanoma right mid back, stage IIIC (pT4a N3 M0); 30 mm with microsatellitosis, mitotic rate 2 per mm2.0% tumor and immune cell PD-L1 staining50-gene panel:  DNA VARIANT DETECTED ? ? 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 ? 74% TREATMENT HX:- Adjuvant Moderna clinical trial (?A Phase 2 Randomized Study of Adjuvant Immunotherapy with the Personalized Cancer Vaccine [phone] and Pembrolizumab Versus Pembrolizumab Alone After Complete Resection of High-Risk Melanoma? Protocol [id number], HIC 47829) C1D1 05/08/19.  Randomized to the pembrolizumab and vaccine arm.?Onc Hx: Mr. Mcgillicuddy (prefers to be called [name]) is a 74 y.o. male who worked for 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  He reports it appeared approx 10 months prior; his PCP originally thought it was a pimple.  Dr. Lorn Junes at [facility] later evaluated it and also thought it was a pimple and injected it with intralesional kenalog x 2 and oral cephalexin without improvement.  The lesion increased in size and burned whenever he leaned against anything.  Denies night sweats and weight loss.  Lucie Leather did a I&D on 01/07/19  with only slight bloody discharge, so a punch biopsy was obtain with path showing metastatic melanoma.  On 01/20/19, additional biopsies were done to try to evaluate a primary site, but we do not have this tissue.  Per patient report, these biopsies were negative. MRI brain 01/30/19 without intracranial disease.  PET done 02/07/19 showing preepiglottic FDG activity s/p ENT evaluation which was negative for any visible malignancy.  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 LDH elevated to 251 at diagnosis. His case was presented at the [facility] tumor board on 02/13/19 and consensus was to ask ENT for random biopsies of the BOT and pre-epiglottic PET-avid areas, as we do not want to miss a head and neck cancer but otherwise not felt to be related to his melanoma.  The cervical LN were not thought to be related.  He should also have gastroenterology evaluation for a PET-avid stranding lesion in the transverse colon.  Dr. Landis Gandy (ENT) performed BOT and vallecula random biopsies 02/20/19 which only showed chronic inflammation and no malignancy.  He had a colonoscopy by Dr. Linton Flemings on 02/27/19 showing only a tubular adenoma in the sigmoid colon.  Overall, it was felt that his right mid back lesion is the site of his primary disease, and the metastatic melanoma pathology interpretation may have been complicated due to repeat I&Ds and steroid injections that may have altered the architecture of the primary lesion.  In the absence of any confirmed metastatic disease, he underwent WLE and LND with Dr. Duanne Moron on 03/03/19.  Final path showed a 30 mm thick melanoma, 2 mitoses/mm2, non-brisk TILs, no tumor regression, LVI+, microsatellites+, negative margins, and 3/35 lymph nodes (largest LN 4.5 x 3 cm) were positive for melanoma with presence of lymphatic invasion without extranodal extension.?He uses a cane sometimes as he has some balance issues due to neuropathy in his feet.  Otherwise, he is fully functional, active, and independent in ADLs/iADLs.  No hx of autoimmune disease.?03/27/19 signed consent for the Moderna trial; randomized to pembro and vaccine arm.04/24/19 noted to have wound dehiscence, non-infected, healing by secondary intent.  Re-instated VNA services to assist  with dressing changes and packing.  Wound completed healed by 06/20/19.INTERIM HISTORYAndy returns for follow-up today and C6 of his clinical trial. Due for pembrolizumab + his personalized vaccine today.Traveled to [place] to visit his daughter since last visit.Since his last treatment 3 weeks ago, he notes the following issues:GI:-Diarrhea for 4-5 days, 5-6 episodes/day after last cycle;no abdominal cramping or blood in stool;  Relieved with half day of imodium; did not require lomotil-taste changes/loss of smell and appetite (3/7//21)  have all improved; eating better with some weight gain since last visit-occasional gas pains GR1, intermittent relieved with flatusResp:-DOE with walking long distances >200 yards-cough has resolvedExtremities:- local muscle injection site pain Gr1 achy pain x4 days (this occurred after his last dose again)- lower extremity swelling is significantly improved from his chronic edemaMetabolic- glucose levels under better control; long-acting insulin dose now 40-50 units qhs- fatigue (Gr1) since 3/11 which remains stable.  Has trouble getting work done at home.  Takes a 20-30 min naps about 1-2/days.  Has difficulty going to sleep; still taking his chronic ambien.He continues to go to the casinos.  Denies any fever.  Got his first dose of COVID 07/22/2019 without issues.?NWG:NFAOZH of Systems Constitutional: Positive for malaise/fatigue. Negative for chills, fever and weight loss. HENT: Negative.  Eyes: Negative.  Respiratory: Negative for cough and wheezing. Shortness of breath: mild.  Cardiovascular: Positive for leg swelling. Negative for chest pain and palpitations. Gastrointestinal: Positive for diarrhea. Negative for abdominal pain, blood in stool, constipation, nausea and vomiting. Genitourinary: Negative.  Musculoskeletal: Negative.  Skin: Negative.  Neurological: Negative.       Diabetic neuropathy in the hands and feet stable.   Psychiatric/Behavioral: Negative.  A comprehensive 12-point review of systems was performed and is otherwise negative.PAST MEDICAL HISTORY: Iven Earnhart has a past medical history of Diabetes mellitus (HC Code), High cholesterol, and Malignant melanoma of torso excluding breast (HC Code).PAST SURGICAL HISTORY: He  has a past surgical history that includes Total hip arthroplasty (Right, 2002); Rotator cuff repair (Left, 1999); Tonsillectomy; Coronary angioplasty with stent (Bilateral); Colonoscopy; and laryngoscopy with biopsy (02/20/2019).CURRENT MEDICATIONS: ?  acetaminophen, 975 mg, Oral, Q6H PRN?  aspirin, 81 mg, Oral, Nightly?  FREESTYLE LIBRE 14 DAY, 1 each, Other, Q14 Days?  glipiZIDE XL, 10 mg, Oral, Nightly?  ibuprofen, 400 mg, Oral, Q6H PRN?  simvastatin, 20 mg, Oral, Nightly?  TOUJEO SOLOSTAR U-300, 30 Units, Subcutaneous, Nightly?  zolpidem, nightly..?  ALPRAZolam, 0.5 mg. ?  bedside commode, 3 in 1 commode?  docusate sodium, 250 mg, Oral, BID PRN (Patient not taking: Reported on 08/21/2019)?  guaiFENesin, 400 mg, Oral, Q4H PRN (Patient not taking: Reported on 08/21/2019)?  olopatadine, as needed (takes for eye irritation as needed). ?  walker, Use as directed.No current facility-administered medications for this visit. ?  albuterol?  diphenhydrAMINE?  EPINEPHrine?  EPINEPHrine?  famotidine (PEPCID) IV (All ages)?  HIC [id number] [phone])?  HIC [id number] Pembrolizumab (MK-3475)?  hydrocortisone IV Push OR IVPB (SOLU-CORTEF)?  meperidine PF?  sodium chloride 0.9 % (new bag)  ALLERGIES: Patient has no known allergies.SOCIAL HISTORY: Mr. Haskew  reports that he has never smoked. He has never used smokeless tobacco. He reports previous alcohol use. He reports that he does not use drugs.FAMILY HISTORY: His family history includes Bladder cancer (age of onset: 79) in his father; Heart disease in his mother; Kidney cancer in his sister; Leukemia (age of onset: 85) in his brother; No Known Problems in his daughter and son.PHYSICAL EXAM: VSS and reviewed in medical record;  ECOG: 1Physical ExamConstitutional:     General: He is not in acute distress.   Appearance: Normal appearance. He is well-developed. He is not ill-appearing. HENT:    Head: Normocephalic and atraumatic. Eyes:    General: Lids are normal. No scleral icterus.   Extraocular Movements: Extraocular movements intact.    Conjunctiva/sclera: Conjunctivae normal.    Comments: left eye ptosis completely resolved Neck:    Musculoskeletal: Full passive range of motion without pain.    Thyroid: No thyromegaly.    Trachea: Trachea and phonation normal. Cardiovascular:    Rate and Rhythm: Normal rate and regular rhythm.    Heart sounds: Normal heart sounds. No murmur. No friction rub. No gallop.     Comments: Trace bilateral lower extremity edemaPulmonary:    Effort: Pulmonary effort is normal.    Breath sounds: Normal breath sounds and air entry. No decreased breath sounds or wheezing. Abdominal:    General: Bowel sounds are normal. There is no distension.    Palpations: Abdomen is soft.    Tenderness: There is no abdominal tenderness.    Hernia: A hernia is present. Hernia is present in the umbilical area. Musculoskeletal:    Right lower leg: Edema present.    Left lower leg: Edema present. Lymphadenopathy:    Cervical: No cervical adenopathy.    Right cervical: No superficial, deep or posterior cervical adenopathy.   Left cervical: No superficial, deep or posterior cervical adenopathy.    Upper Body:    Right upper body: No supraclavicular adenopathy.    Left upper body: No supraclavicular adenopathy.    Comments: Lymphedema present in the right arm, unchanged. Skin:   Comments: Right upper back wound/incision is well healed.  Small seromas at the medial and lateral edges of the scar also improving.  Right axillary scar well healed without concerning nodularity.   Neurological:    General: No focal deficit present. Mental Status: He is alert and oriented to person, place, and time. Psychiatric:       Attention and Perception: Attention normal.       Mood and Affect: Mood normal.       Speech: Speech normal.       Behavior: Behavior is cooperative.       Thought Content: Thought content normal. LABORATORY DATA:CBC:Recent Results (from the past 8736 hour(s)) CBC auto differential  Collection Time: 08/21/19 10:48 AM Result Value Ref Range  WBC 9.7 4.0 - 10.0 x1000/?L  RBC 4.9 3.8 - 5.9 M/?L  Hemoglobin 13.6 12.0 - 18.0 g/dL  Hematocrit 16.1 09.6 - 52.0 %  MCV 86.5 78.0 - 94.0 fL  MCHC 32.2 31.0 - 36.0 g/dL  RDW-CV 04.5 40.9 - 81.1 %  Platelets 328 140 - 440 x1000/?L  MPV 10.8 6.0 - 11.0 fL  ANC (Abs Neutrophil Count) 7.0 1.0 - 11.0 x 1000/?L  Neutrophils 72.5 37.0 - 84.0 %  Lymphocytes 17.2 8.0 - 49.0 %  Absolute Lymphocyte Count 1.7 1.0 - 4.0 x 1000/?L  Monocytes 6.6 4.0 - 15.0 %  Monocyte Absolute Count 0.6 0.0 - 2.0 x 1000/?L  Eosinophils 2.4 0.0 - 7.0 %  Eosinophil Absolute Count 0.2 0.0 - 1.0 x 1000/?L  Basophil 0.8 0.0 - 4.0 %  Basophil Absolute Count 0.1 (H) 0.0 - 0.0 x 1000/?L  Immature Granulocytes 0.5 0.0 - 3.0 %  Absolute Immature Granulocyte Count 0.1 0.0 - 0.3 x 1000/?L  nRBC 0.0 0.0 - 1.0 %  Absolute nRBC 0.0 0.0 - 0.0 x 1000/?L  MCH 27.8  27.0 - 31.0 pg OZH:YQMVHQ Results (from the past 8736 hour(s)) Comprehensive metabolic panel  Collection Time: 08/21/19 10:48 AM Result Value Ref Range  Sodium 140 136 - 144 mmol/L  Potassium 4.6 3.3 - 5.1 mmol/L  Chloride 102 98 - 107 mmol/L  CO2 29 20 - 30 mmol/L  Anion Gap 9 7 - 17  Glucose 169 (H) 70 - 100 mg/dL  BUN 13 8 - 23 mg/dL  Creatinine 4.69 6.29 - 1.30 mg/dL  Calcium 9.2 8.8 - 52.8 mg/dL  BUN/Creatinine Ratio 41.3 8.0 - 23.0  Total Protein 8.2 6.6 - 8.7 g/dL  Albumin 3.8 3.6 - 4.9 g/dL  Total Bilirubin 0.4 <=2.4 mg/dL  Alkaline Phosphatase 401 9 - 122 U/L  Alanine Aminotransferase (ALT) 29 9 - 59 U/L  Aspartate Aminotransferase (AST) 25 10 - 35 U/L  Globulin 4.4 (H) 2.3 - 3.5 g/dL  A/G Ratio 0.9 (L) 1.0 - 2.2  AST/ALT Ratio 0.9 See Comment  eGFR (Afr Amer) >60 >60 mL/min/1.57m2  eGFR (NON African-American) >60 >60 mL/min/1.66m2 LDH: Recent Results (from the past 8736 hour(s)) Lactate dehydrogenase  Collection Time: 08/21/19 10:48 AM Result Value Ref Range  LD 139 122 - 241 U/L IMAGING:  [place] CHEST W IV CONTRAST Date: 07/28/2019 5:26 PMPROVIDED INDICATION: Melanoma, stage >= IIB, monitorCOMPARISON: Chest [place] dated December 30, 2020TECHNIQUE:  A volumetric [employer] acquisition of the chest was obtained from the thoracic inlet to the upper abdomen, following the administration 80cc of Omnipaque 350 intravenous contrast. Coronal and sagittal reconstructed images are provided. FINDINGS:Lungs/Airways/Pleura : Prominent azygos fissure, a normal variant. Bilateral dependent compressive atelectasis. Stable 4 mm left lower lobe pulmonary nodule in series 5 image 149. No new or enlarging pulmonary nodules. No consolidations to suggest pneumonia. Small bilateral pleural effusions. No pneumothorax. Central airways are clear.  Mediastinum/Lymph nodes: No thoracic adenopathy. Normal thyroid.Heart and Vessels: Normal cardiac size. No pericardial effusion.  Normal course and caliber of the thoracic aorta. Pulmonary artery is normal size.  Severe atherosclerotic calcifications of the coronary arteries and aorta. Calcifications of the aortic valve.Upper Abdomen: See separate dictated abdominal [place] report for findings below the level of the diaphragm.Osseous structures and Soft Tissues: No aggressive bone lesions. Degenerative changes throughout the thoracic spine. Postsurgical changes of the right chest wall and right axilla. No new axillary or subpectoral adenopathy.   IMPRESSION:1. No new pulmonary nodules or adenopathy within the thorax.2. New small bilateral pleural effusions. [place] ABDOMEN PELVIS W IV CONTRAST on 07/28/2019 5:26 PMINDICATION: Melanoma, stage >= IIB, monitor.COMPARISON: [place] 12/31/2020TECHNIQUE: [place] images of the abdomen and pelvis were obtained after the administration of 80 cc intravenous contrast (Omnipaque 350).Technical limitations: None.FINDINGS:Chest  findings will be interpreted separatelyHepatobiliary: Unremarkable.Pancreas: Unremarkable.Spleen: Unremarkable.Adrenal glands: Unremarkable.Kidneys: Unchanged bilateral nonobstructing calculiBowel: No bowel obstruction.Abdominal and pelvic lymph nodes: No lymphadenopathy.Peritoneum: No ascites.Vasculature: No abdominal aortic aneurysm. Pelvis: No mass.Musculoskeletal system and soft tissue: No aggressive osseous lesion. Right hip arthroplasty. Fat-containing umbilical hernia.   IMPRESSION:No findings of melanoma in the abdomen and pelvisIMPRESSION AND PLAN: Gwynn Crossley is a 74 y.o. male [name] is a 74 y.o. 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  Initially path read as metastatic melanoma, but further workup confirms that this is the primary site, not a metastatic lesion.  Initial biopsy was likely altered due to repeat I&Ds of the area.  He had a FBSE with his dermatologist and no additional primary lesions were identified.  He is otherwise  in good health except for metabolic syndrome and diabetes-associated neuropathy.  PET 02/07/19 showed the main lesion on the right upper back extending to the latissimus dorsi muscle with involved right axillary nodes and nonspecific avidity in the BOT and preepiglottis for which direct visual evaluation was recommended and done 02/11/19 and random biopsies 02/20/19 without concerns for malignancy. Colonoscopy was also done to evaluate stranding in the transverse colon, which was negative for malignancy on exam.  Now s/p WLE and LND with Dr. Duanne Moron on 03/03/19 showing a stage IIIC melanoma that was 30 mm thick, positive for LVI and microsatellites with 3/25 positive LNs.  Given the thickness of his primary melanoma and microsatellitosis, we felt that his melanoma-specific survival is closer to a stage IIID:  60% over 10 years with stage IIIC disease versus 24% with stage IIID.  He does not have a BRAF mutation, so SOC options are immune therapy at this point versus the Moderna clinical trial (?A Phase 2 Randomized Study of Adjuvant Immunotherapy with the Personalized Cancer Vaccine [phone] and Pembrolizumab Versus Pembrolizumab Alone After Complete Resection of High-Risk Melanoma?) vs active surveillance (for which he would be extremely high risk for relapsing).  He opted for the Moderna trial and signed consent on 03/27/19; tissue passed QC inspection.  ?[name] was randomized to the [facility] with vaccine arm of the trial.  He has experienced some weight loss due to loss of taste/smell/appetite after initiation the clinical trial, but his albumin is normal, and his diabetes is under better control.  He knows to focus on dietary protein and eat scheduled meals despite a lack of appetite.  He has stable right arm lymphedema but declines any lymphedema therapy or sleeves. 07/28/19: [place] CAP NED; plan for surveillance scans q12 weeks5/7/21: GI: G2 diarrhea after cycle 4 resolved; Grade 1 after cycle 5 resolved. 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 ROS, labs and exam meet treatment parameters. Plan to treat with C6 today with pembrolizumab and vaccine.COVID vaccine post-poned to next week. - routine f/u with dermatologist every 4-6 months.  Last seen 04/2019; he reports having an appt coming up soon.- RTC in 3 weeks per trial schedule if able to treat today; instructed to call if any issues or questions in the interimMarianne Olympia Fields, APRNPatient Care Team:Deutsch, Beverly Sessions, MD as PCP - General (Internal Medicine)Pagnozzi, Jonny Ruiz, MD (Surgery)Carignan, Philomena Doheny, [place] (Dermatology)Tran, Chelsea Aus, MD as Physician (Medical Oncology)Olino, Tresa Endo, MD as Physician (Complex General Surgical Oncology)

## 2019-08-21 NOTE — Progress Notes
Pt presents to the clinic for cycle 6 day #1 of HIC [id number] mRNA-4157 + PembrolizumabPIV inserted and lab work drawn and processedPt evaluated per M. Connye Burkitt APRN and A. Ralabate CRNPt cleared for treatmentNursing assessment completed.Pt reports + diarrhea after last cycle relieved with imodium and endorsed arm discomfort for 3-4 days after injections. Pt received HIC [id number] ([id number]) investigational study drug administered IM as 2 injections of 05.mg/ml into left deltoid muscle.  Bandaids applied over sitesPt received [id number] Pembrolizumab (MK-3475) 200mg  in 100 ml investigational study drug from 1310 to 1340 to run over 30 minutes. + blood return noted from PIV pre and post infusion.  Pt tolerated wellPIV removed 2x2 and coban applied over site. Pt d/c to home in stable condition in care of selfTrials team will be in contact regarding next appts.

## 2019-08-26 ENCOUNTER — Encounter: Admit: 2019-08-26 | Payer: PRIVATE HEALTH INSURANCE

## 2019-08-31 ENCOUNTER — Ambulatory Visit: Admit: 2019-08-31 | Payer: PRIVATE HEALTH INSURANCE

## 2019-09-01 ENCOUNTER — Encounter: Admit: 2019-09-01 | Payer: PRIVATE HEALTH INSURANCE

## 2019-09-01 ENCOUNTER — Telehealth: Admit: 2019-09-01 | Payer: PRIVATE HEALTH INSURANCE

## 2019-09-01 NOTE — Telephone Encounter
Called Jason Rowland regarding upcoming appointments.  Next appointments are as follows:  May 27th Labs NP8 10:00 AM, Dr. Laveda Norman NP8 11:00 AM, Treatment NP8 11:30 AM.  June 17th Labs 10:00, Dr. Laveda Norman 11:00 AM and Treatment 11:30 AM.  Will call back later this week once July appointments are adjusted.  [name] CRA

## 2019-09-04 ENCOUNTER — Ambulatory Visit: Admit: 2019-09-04 | Payer: PRIVATE HEALTH INSURANCE

## 2019-09-11 ENCOUNTER — Inpatient Hospital Stay: Admit: 2019-09-11 | Discharge: 2019-09-11 | Payer: BLUE CROSS/BLUE SHIELD

## 2019-09-11 ENCOUNTER — Encounter: Admit: 2019-09-11 | Payer: PRIVATE HEALTH INSURANCE | Attending: Medical Oncology

## 2019-09-11 ENCOUNTER — Encounter: Admit: 2019-09-11 | Payer: PRIVATE HEALTH INSURANCE

## 2019-09-11 ENCOUNTER — Ambulatory Visit: Admit: 2019-09-11 | Payer: BLUE CROSS/BLUE SHIELD

## 2019-09-11 DIAGNOSIS — Z7982 Long term (current) use of aspirin: Secondary | ICD-10-CM

## 2019-09-11 DIAGNOSIS — Z955 Presence of coronary angioplasty implant and graft: Secondary | ICD-10-CM

## 2019-09-11 DIAGNOSIS — R0609 Other forms of dyspnea: Secondary | ICD-10-CM

## 2019-09-11 DIAGNOSIS — Z5112 Encounter for antineoplastic immunotherapy: Secondary | ICD-10-CM

## 2019-09-11 DIAGNOSIS — Z79899 Other long term (current) drug therapy: Secondary | ICD-10-CM

## 2019-09-11 DIAGNOSIS — C773 Secondary and unspecified malignant neoplasm of axilla and upper limb lymph nodes: Secondary | ICD-10-CM

## 2019-09-11 DIAGNOSIS — Z794 Long term (current) use of insulin: Secondary | ICD-10-CM

## 2019-09-11 DIAGNOSIS — I890 Lymphedema, not elsewhere classified: Secondary | ICD-10-CM

## 2019-09-11 DIAGNOSIS — E7800 Pure hypercholesterolemia, unspecified: Secondary | ICD-10-CM

## 2019-09-11 DIAGNOSIS — E1142 Type 2 diabetes mellitus with diabetic polyneuropathy: Secondary | ICD-10-CM

## 2019-09-11 DIAGNOSIS — Z5111 Encounter for antineoplastic chemotherapy: Secondary | ICD-10-CM

## 2019-09-11 DIAGNOSIS — Z006 Encounter for examination for normal comparison and control in clinical research program: Secondary | ICD-10-CM

## 2019-09-11 DIAGNOSIS — E785 Hyperlipidemia, unspecified: Secondary | ICD-10-CM

## 2019-09-11 DIAGNOSIS — C4359 Malignant melanoma of other part of trunk: Secondary | ICD-10-CM

## 2019-09-11 DIAGNOSIS — E8881 Metabolic syndrome: Secondary | ICD-10-CM

## 2019-09-11 DIAGNOSIS — E119 Type 2 diabetes mellitus without complications: Secondary | ICD-10-CM

## 2019-09-11 DIAGNOSIS — R609 Edema, unspecified: Secondary | ICD-10-CM

## 2019-09-11 LAB — CBC WITH AUTO DIFFERENTIAL
BKR WAM ABSOLUTE IMMATURE GRANULOCYTES: 0 x 1000/ÂµL (ref 0.0–0.3)
BKR WAM ABSOLUTE LYMPHOCYTE COUNT: 1.9 x 1000/ÂµL (ref 1.0–4.0)
BKR WAM ABSOLUTE NRBC: 0 x 1000/ÂµL (ref 0.0–0.0)
BKR WAM ANALYZER ANC: 5.7 x 1000/ÂµL (ref 1.0–11.0)
BKR WAM BASOPHIL ABSOLUTE COUNT: 0.1 x 1000/ÂµL — ABNORMAL HIGH (ref 0.0–0.0)
BKR WAM BASOPHILS: 0.7 % (ref 0.0–4.0)
BKR WAM EOSINOPHIL ABSOLUTE COUNT: 0.2 x 1000/ÂµL (ref 0.0–1.0)
BKR WAM EOSINOPHILS: 2.1 % (ref 0.0–7.0)
BKR WAM HEMATOCRIT: 40.1 % (ref 37.0–52.0)
BKR WAM HEMOGLOBIN: 13.3 g/dL (ref 12.0–18.0)
BKR WAM IMMATURE GRANULOCYTES: 0.4 % (ref 0.0–3.0)
BKR WAM LYMPHOCYTES: 22.1 % (ref 8.0–49.0)
BKR WAM MCH (PG): 28.5 pg (ref 27.0–31.0)
BKR WAM MCHC: 33.2 g/dL (ref 31.0–36.0)
BKR WAM MCV: 85.9 fL (ref 78.0–94.0)
BKR WAM MONOCYTE ABSOLUTE COUNT: 0.6 x 1000/ÂµL (ref 0.0–2.0)
BKR WAM MONOCYTES: 7.2 % (ref 4.0–15.0)
BKR WAM MPV: 11.2 fL — ABNORMAL HIGH (ref 6.0–11.0)
BKR WAM NEUTROPHILS: 67.5 % (ref 37.0–84.0)
BKR WAM NUCLEATED RED BLOOD CELLS: 0 % (ref 0.0–1.0)
BKR WAM PLATELETS: 275 x1000/ÂµL (ref 140–440)
BKR WAM RDW-CV: 14.6 % — ABNORMAL HIGH (ref 11.5–14.5)
BKR WAM RED BLOOD CELL COUNT: 4.7 M/ÂµL (ref 3.8–5.9)
BKR WAM WHITE BLOOD CELL COUNT: 8.4 x1000/ÂµL (ref 4.0–10.0)

## 2019-09-11 LAB — URINE MICROSCOPIC     (BH GH LMW YH)

## 2019-09-11 LAB — T4, FREE: BKR FREE T4: 0.95 ng/dL

## 2019-09-11 LAB — PHOSPHORUS     (BH GH L LMW YH): BKR PHOSPHORUS: 3.3 mg/dL (ref 2.2–4.5)

## 2019-09-11 LAB — COMPREHENSIVE METABOLIC PANEL
BKR A/G RATIO: 1 (ref 1.0–2.2)
BKR ALANINE AMINOTRANSFERASE (ALT): 23 U/L (ref 9–59)
BKR ALBUMIN: 3.8 g/dL (ref 3.6–4.9)
BKR ALKALINE PHOSPHATASE: 85 U/L (ref 9–122)
BKR ANION GAP: 11 (ref 7–17)
BKR ASPARTATE AMINOTRANSFERASE (AST): 22 U/L (ref 10–35)
BKR AST/ALT RATIO: 1
BKR BILIRUBIN TOTAL: 0.3 mg/dL (ref <=1.2)
BKR BLOOD UREA NITROGEN: 22 mg/dL (ref 8–23)
BKR BUN / CREAT RATIO: 24.2 — ABNORMAL HIGH (ref 8.0–23.0)
BKR CALCIUM: 9.3 mg/dL (ref 8.8–10.2)
BKR CHLORIDE: 99 mmol/L (ref 98–107)
BKR CO2: 26 mmol/L (ref 20–30)
BKR CREATININE: 0.91 mg/dL (ref 0.40–1.30)
BKR EGFR (AFR AMER): >60 mL/min/{1.73_m2} (ref >60)
BKR EGFR (NON AFRICAN AMERICAN): >60 mL/min/{1.73_m2} (ref >60)
BKR GLOBULIN: 4 g/dL — ABNORMAL HIGH (ref 2.3–3.5)
BKR GLUCOSE: 335 mg/dL — ABNORMAL HIGH (ref 70–100)
BKR POTASSIUM: 4.8 mmol/L (ref 3.3–5.1)
BKR PROTEIN TOTAL: 7.8 g/dL (ref 6.6–8.7)
BKR SODIUM: 136 mmol/L (ref 136–144)

## 2019-09-11 LAB — CK     (BH GH L LMW YH): BKR CREATINE KINASE TOTAL: 77 U/L (ref 11–204)

## 2019-09-11 LAB — URINALYSIS-MACROSCOPIC W/REFLEX MICROSCOPIC
BKR BILIRUBIN, UA: NEGATIVE
BKR BLOOD, UA: NEGATIVE
BKR KETONES, UA: NEGATIVE
BKR LEUKOCYTE ESTERASE, UA: NEGATIVE
BKR NITRITE, UA: NEGATIVE
BKR PH, UA: 6 (ref 5.5–7.5)
BKR SPECIFIC GRAVITY, UA: 1.026 — ABNORMAL HIGH (ref 1.005–1.020)
BKR UROBILINOGEN, UA: 0.2 EU/dL (ref <=2.0)

## 2019-09-11 LAB — PT/INR AND PTT (BH GH L LMW YH)
BKR INR: 0.97 (ref 0.92–1.08)
BKR PARTIAL THROMBOPLASTIN TIME: 24.1 s (ref 22.2–29.6)
BKR PROTHROMBIN TIME: 10.4 s (ref 9.9–11.5)

## 2019-09-11 LAB — LACTATE DEHYDROGENASE: BKR LACTATE DEHYDROGENASE: 172 U/L (ref 122–241)

## 2019-09-11 LAB — T3: BKR T3 TOTAL: 96.1 ng/dL

## 2019-09-11 LAB — MAGNESIUM: BKR MAGNESIUM: 1.7 mg/dL (ref 1.7–2.4)

## 2019-09-11 LAB — TSH: BKR THYROID STIMULATING HORMONE: 4.34 u[IU]/mL — ABNORMAL HIGH

## 2019-09-11 MED ORDER — SODIUM CHLORIDE 0.9 % BOLUS (NEW BAG)
0.9 % | Freq: Once | INTRAVENOUS | Status: DC | PRN
Start: 2019-09-11 — End: 2019-09-16

## 2019-09-11 MED ORDER — HIC 2000025461 (MRNA-4157)
Freq: Once | INTRAMUSCULAR | Status: CP
Start: 2019-09-11 — End: ?
  Administered 2019-09-11: 18:00:00 1.000 mL via INTRAMUSCULAR

## 2019-09-11 MED ORDER — HYDROCORTISONE SODIUM SUCCINATE 100 MG SOLUTION FOR INJECTION
100 mg | Freq: Once | INTRAVENOUS | Status: DC | PRN
Start: 2019-09-11 — End: 2019-09-16

## 2019-09-11 MED ORDER — DIPHENHYDRAMINE 50 MG/ML INJECTION SOLUTION
50 mg/mL | Freq: Once | INTRAVENOUS | Status: DC | PRN
Start: 2019-09-11 — End: 2019-09-16

## 2019-09-11 MED ORDER — EPINEPHRINE 0.3 MG/0.3 ML INJECTION, AUTO-INJECTOR
0.3 mg/ mL | INTRAMUSCULAR | Status: DC | PRN
Start: 2019-09-11 — End: 2019-09-16

## 2019-09-11 MED ORDER — HIC 2000025461 PEMBROLIZUMAB (MK-3475)
Freq: Once | INTRAVENOUS | Status: CP
Start: 2019-09-11 — End: ?
  Administered 2019-09-11: 18:00:00 100.000 mL/h via INTRAVENOUS

## 2019-09-11 MED ORDER — ALBUTEROL SULFATE 2.5 MG/3 ML (0.083 %) SOLUTION FOR NEBULIZATION
2.5 mg /3 mL (0.083 %) | RESPIRATORY_TRACT | Status: DC | PRN
Start: 2019-09-11 — End: 2019-09-11

## 2019-09-11 MED ORDER — FAMOTIDINE 4 MG/ML IN STERILE WATER (ADULT)
Freq: Once | INTRAVENOUS | Status: DC | PRN
Start: 2019-09-11 — End: 2019-09-16

## 2019-09-11 MED ORDER — SODIUM CHLORIDE 0.9 % INTRAVENOUS SOLUTION
INTRAVENOUS | Status: DC | PRN
Start: 2019-09-11 — End: 2019-09-16

## 2019-09-11 MED ORDER — MEPERIDINE 25 MG/2.5 ML IN 0.9% SODIUM CHLORIDE
Freq: Once | INTRAVENOUS | Status: DC | PRN
Start: 2019-09-11 — End: 2019-09-16

## 2019-09-11 MED ORDER — SODIUM CHLORIDE 0.9 % (FLUSH) INJECTION SYRINGE
0.9 % | Status: DC | PRN
Start: 2019-09-11 — End: 2019-09-16

## 2019-09-11 NOTE — Progress Notes
HIC #?[id number]?Cycle?7, Day 1?Patient seen and examined by?Dr. Romona Curls and toxicity assessment reviewed?by Dr. Lucianne Muss labs are deemed NCS today, Concomitant medications reviewed and updated.Approved for treatment?for pembro and vaccine dose?today per??Dr. Laveda Norman.??ECOG PS =?1??Concomitant medications:?MEDICATION SINGLE DOSE UNITS ROUTE FREQUECY START DATE STOP DATE INDICATION Aspirin 81 mg PO QD 2010 ongoing PPX CVD Simvastatin 20 Mg PO QD 2012 ongoing Hypercholesterolemia ?Ambien 10 mg PO QD 2016 ongoing insomnia ?Glucoside 10 mg PO QD 2014 ongoing diabetes ?Toujeo Solostar ?80 Units SQ QD ?2016 ?07/08/19 diabetes ?guaifenesin 400 mg PO q 4 hours prn 07/10/19 08/16/19 cough ?imodium 2 Mg PO PRN 07/19/19 ongoing diarrhea Toujeo Solostar 40-50 units subcutaneous QD 07/09/19 ongoing diabetes ? ? ? ? ? ? ? ? ? ? ? ? ? ? ? ? ? ? ? ? ? ? ? ? ??Medical History:?? ? ? ? ? ? ? ? ? ? ? ? ? ? ? ? ? ? ? ? ? ? ? ? ? ? ? ? PATIENT NAME:?Jason Rowland??STUDY ID: ?[id number] ? ? 0=NA ?????????????????????1= Unrelated ????????2= Unlikely 3=Possible ????????4= Probable ??????????5. Definite ([place]) 0= No Action Taken ????????????????????????????????????????1= Con Med ???????????????????????????????????????????????????????????2=Dose Modified ???????????????????????????????????????3=Dose Delayed ????????????????????????????????????????????????????????4= Patient Hospitalized ?????????????????????????????????????5= Patient Taken Off Study ???????????????????6=Non Drug Therapy ???????????????????????????????????????7=Other (Specify) 1=Recovered ??????????????????????2=Still under txmt/observation ?????????????????????????????3=Alive w/Sequelae ????????????????????????????????????????????????????????????????4=Died ????????????????????????5=Resolvd to Baseline ????????????6=Grade Change ? ? ? Medical History ? ? ? ? ? ? ? ? ? ? ? ADVERSE EVENT  Is AE Intermittent? Y/N SAE ????????????Y/N Grade Expect?PEMBRO?Y/N ?Relation to?PEMBRO Expect?mRNA????????????Y/N ?Relation to?mRNA Start Date End Date/ or ?Ongoing Action Taken ??????????????(Select # above) AE OUTCOME ? Hypercholesterolemia N N 1 Med Hx Med Hx Med Hx Med Hx 2012 ongoing 1=?simvastatin 2 ? diabetes N N 1 Med Hx Med Hx Med Hx Med Hx 2010 ongoing 1-?glucoside; toujeo 2 ? Insomnia N N 1 Med Hx Med Hx Med Hx Med Hx 2016 ongoing 1- ambien 2 ? Edema?limbs N N 1 Med Hx? Med Hx Med Hx Med Hx ?03/03/2019 ongoing 0 2 ? Diarrhea? N N 1 Y? 1 ?N/A ?N/A ?05/27/2019 05/27/2019 0 5 ? fatigue? ?N N? 1? ?Y 3? ?Y 3? ?06/26/19 ?ongoing ?0 2? ? dysgeusia N N 1 N 2 N 2 06/22/19 08/21/19 0 2 ? anorexia N N 1 Y 2 Y 2 06/22/19 08/21/19 0 2 ? Pain, injection site Y N 1 N 1 Y 4 06/19/19 06/23/19 0 2 ? Eye disorders, other (left eye droop) N N 1 N 2 N 2 07/01/19 07/31/19 0 2 ? vomiting N N 1 Y 3 Y 3 07/08/19 07/08/19 0 1 ? Cathlyn Parsons 1 Y 3 Y 3 07/08/19 07/08/19 0 2 ? cough N N 1 Y 3 Y 3 07/08/19 08/16/19 1 = gueifenisen 2 ? ALT increased N N 1 Y 3 Y 3 07/10/19 07/31/19 0 2 ? AST increased  N N 1 Y 3 Y 3 07/10/19 07/31/19 0 2 ? Alkaline phos increased N N 2 Y 3 Y 3 07/10/19 07/31/19 0 2 ? Diarrhea? Y N 2 Y 3 Y 3 07/09/19 07/20/2019 1- imodium 1 ? Injection site pain Y N 1 N 1 Y 3 07/08/19 ongoing 0 2 ? bloating Y N 1 N 1 N 1 07/08/19 ongoing 0 2 ? Bilateral foot neuropathy N N 1 N/A N/A N/A N/A Baseline 02/2019 ongoing 0 2 ? Dyspnea on exertion Y N 1 N/A N/A N/A N/A Baseline 02/2019 ongoing 0 2 ? ? ? ? ? ? ? ? ? ? ? ? ? ? ? ? ? ? ? ? ? ? ? ? ? ? ? ? ?  Labs:Results for Jason Rowland, Jason Rowland (MRN [id number])  Ref. Range 09/11/2019 11:09 09/11/2019 11:10 Sodium Latest Ref Range: 136 - 144 mmol/L 136  Potassium Latest Ref Range: 3.3 - 5.1 mmol/L 4.8  Chloride Latest Ref Range: 98 - 107 mmol/L 99  CO2 Latest Ref Range: 20 - 30 mmol/L 26  Anion Gap Latest Ref Range: 7 - 17  11  BUN Latest Ref Range: 8 - 23 mg/dL 22  Creatinine Latest Ref Range: 0.40 - 1.30 mg/dL 8.29  BUN/Creatinine Ratio Latest Ref Range: 8.0 - 23.0  24.2 (H)  eGFR (NON African-American) Latest Ref Range: >60 mL/min/1.26m2 >60  eGFR (Afr Amer) Latest Ref Range: >60 mL/min/1.61m2 >60  Glucose Latest Ref Range: 70 - 100 mg/dL 562 (H)  Calcium Latest Ref Range: 8.8 - 10.2 mg/dL 9.3  Magnesium Latest Ref Range: 1.7 - 2.4 mg/dL 1.7  Phosphorus Latest Ref Range: 2.2 - 4.5 mg/dL 3.3  Total Bilirubin Latest Ref Range: <=1.2 mg/dL 0.3  Alkaline Phosphatase Latest Ref Range: 9 - 122 U/L 85  Alanine Aminotransferase (ALT) Latest Ref Range: 9 - 59 U/L 23  Aspartate Aminotransferase (AST) Latest Ref Range: 10 - 35 U/L 22  AST/ALT Ratio Latest Ref Range: See Comment  1.0  LD Latest Ref Range: 122 - 241 U/L 172  Total Protein Latest Ref Range: 6.6 - 8.7 g/dL 7.8  Albumin Latest Ref Range: 3.6 - 4.9 g/dL 3.8  Globulin Latest Ref Range: 2.3 - 3.5 g/dL 4.0 (H)  A/G Ratio Latest Ref Range: 1.0 - 2.2  1.0  Total CK Latest Ref Range: 11 - 204 U/L 77  T3, Total Latest Ref Range: See Comment ng/dL 13.0  Thyroid Stimulating Hormone, 3rd Gen. Latest Ref Range: See Comment ?IU/mL 4.340 (H)  Free T4 Latest Ref Range: See Comment ng/dL 8.65  WBC Latest Ref Range: 4.0 - 10.0 x1000/?L 8.4  RBC Latest Ref Range: 3.8 - 5.9 M/?L 4.7  Hemoglobin Latest Ref Range: 12.0 - 18.0 g/dL 78.4  Hematocrit Latest Ref Range: 37.0 - 52.0 % 40.1  MCV Latest Ref Range: 78.0 - 94.0 fL 85.9  MCH Latest Ref Range: 27.0 - 31.0 pg 28.5  MCHC Latest Ref Range: 31.0 - 36.0 g/dL 69.6  RDW-CV Latest Ref Range: 11.5 - 14.5 % 14.6 (H)  nRBC Latest Ref Range: 0.0 - 1.0 % 0.0  Platelets Latest Ref Range: 140 - 440 x1000/?L 275  MPV Latest Ref Range: 6.0 - 11.0 fL 11.2 (H)  Neutrophils Latest Ref Range: 37.0 - 84.0 % 67.5  Lymphocytes Latest Ref Range: 8.0 - 49.0 % 22.1  Monocytes Latest Ref Range: 4.0 - 15.0 % 7.2  Eosinophils Latest Ref Range: 0.0 - 7.0 % 2.1 Basophils Latest Ref Range: 0.0 - 4.0 % 0.7  ANC (Abs Neutrophil Count) Latest Ref Range: 1.0 - 11.0 x 1000/?L 5.7  Absolute Lymphocyte Count Latest Ref Range: 1.0 - 4.0 x 1000/?L 1.9  Monocytes (Absolute) Latest Ref Range: 0.0 - 2.0 x 1000/?L 0.6  Eosinophil Absolute Count Latest Ref Range: 0.0 - 1.0 x 1000/?L 0.2  Basophils Absolute Latest Ref Range: 0.0 - 0.0 x 1000/?L 0.1 (H)  Immature Granulocytes (Abs) Latest Ref Range: 0.0 - 0.3 x 1000/?L 0.0  nRBC Absolute Latest Ref Range: 0.0 - 0.0 x 1000/?L 0.0  Immature Granulocytes Latest Ref Range: 0.0 - 3.0 % 0.4  Prothrombin Time Latest Ref Range: 9.9 - 11.5 seconds 10.4  INR Latest Ref Range: 0.92 - 1.08  0.97  PTT Latest Ref Range: 22.2 - 29.6 seconds  24.1  Clarity, UA Latest Ref Range: Clear   Clear Specific Gravity, UA Latest Ref Range: 1.005 - 1.020   1.026 (H) pH, UA Latest Ref Range: 5.5 - 7.5   6.0 Protein, UA Latest Ref Range: Negative-Trace   2+ (A) Glucose, UA Latest Ref Range: Negative   3+ (A) Ketones, UA Latest Ref Range: Negative   Negative Blood, UA Latest Ref Range: Negative   Negative Bilirubin, UA Latest Ref Range: Negative   Negative Leukocytes, UA Latest Ref Range: Negative   Negative Nitrite, UA Latest Ref Range: Negative   Negative WBC/HPF Latest Ref Range: 0 - 5 /HPF  3-5 RBC/HPF Latest Ref Range: 0 - 2 /HPF  3-5 (A) Mucus, UA Latest Ref Range: None-Few /HPF  Few Color, UA Latest Ref Range: Yellow   Yellow Urobilinogen, UA Latest Ref Range: <=2.0 EU/dL  0.2

## 2019-09-11 NOTE — Progress Notes
NAME:  Jason Rowland ([birth year])AGE: 74 y.o. MRN:  [id number] PROVIDER: Drema Halon, MD, PhDDATE OF SERVICE: 09/11/2019 [facility] [place]  REASON FOR VISIT:  C7D1 of HIC 25461ONC DX:  Invasive melanoma right mid back, stage IIIC (pT4a N3 M0); 30 mm with microsatellitosis, mitotic rate 2 per mm2.0% tumor and immune cell PD-L1 staining50-gene panel:  DNA VARIANT DETECTED ? ? 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 ? 74% TREATMENT HX:- Adjuvant Moderna clinical trial (?A Phase 2 Randomized Study of Adjuvant Immunotherapy with the Personalized Cancer Vaccine [phone] and Pembrolizumab Versus Pembrolizumab Alone After Complete Resection of High-Risk Melanoma? Protocol [id number]-[id number], HIC 47829) C1D1 05/08/19.  Randomized to the pembrolizumab and vaccine arm. Onc Hx: Mr. Sheridan (prefers to be called Jason Rowland) is a 74 y.o. male who worked for 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  He reports it appeared approx 10 months prior; his PCP originally thought it was a pimple.  Dr. Lorn Junes at [facility] later evaluated it and also thought it was a pimple and injected it with intralesional kenalog x 2 and oral cephalexin without improvement.  The lesion increased in size and burned whenever he leaned against anything.  Denies night sweats and weight loss.  Lucie Leather did a I&D on 01/07/19  with only slight bloody discharge, so a punch biopsy was obtain with path showing metastatic melanoma.  On 01/20/19, additional biopsies were done to try to evaluate a primary site, but we do not have this tissue.  Per patient report, these biopsies were negative. MRI brain 01/30/19 without intracranial disease.  PET done 02/07/19 showing preepiglottic FDG activity s/p ENT evaluation which was negative for any visible malignancy.  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 LDH elevated to 251 at diagnosis. His case was presented at the [facility] Melanoma tumor board on 02/13/19 and consensus was to ask ENT for random biopsies of the BOT and pre-epiglottic PET-avid areas, as we do not want to miss a head and neck cancer but otherwise not felt to be related to his melanoma.  The cervical LN were not thought to be related.  He should also have gastroenterology evaluation for a PET-avid stranding lesion in the transverse colon.  Dr. Landis Gandy (ENT) performed BOT and vallecula random biopsies 02/20/19 which only showed chronic inflammation and no malignancy.  He had a colonoscopy by Dr. Linton Flemings on 02/27/19 showing only a tubular adenoma in the sigmoid colon.  Overall, it was felt that his right mid back lesion is the site of his primary disease, and the metastatic melanoma pathology interpretation may have been complicated due to repeat I&Ds and steroid injections that may have altered the architecture of the primary lesion.  In the absence of any confirmed metastatic disease, he underwent WLE and LND with Dr. Duanne Moron on 03/03/19.  Final path showed a 30 mm thick melanoma, 2 mitoses/mm2, non-brisk TILs, no tumor regression, LVI+, microsatellites+, negative margins, and 3/35 lymph nodes (largest LN 4.5 x 3 cm) were positive for melanoma with presence of lymphatic invasion without extranodal extension.He uses a cane sometimes as he has some balance issues due to neuropathy in his feet.  Otherwise, he is fully functional, active, and independent in ADLs/iADLs.  No hx of autoimmune disease.03/27/19 signed consent for the Moderna trial; randomized to [facility]  and vaccine arm.04/24/19 noted to have wound dehiscence, non-infected, healing by secondary intent.  Re-instated VNA services to assist with dressing changes and packing.  Wound completely healed by 06/20/19.Interval ZO:XWRU returns for follow-up today and C7 of his clinical trial.  He will get pembrolizumab + his personalized vaccine today.Since his last treatment 3 weeks ago, he notes the following?issues:?GI:-Diarrhea resolved; 1 soft stool not truly loose;  Hasn't needed any Imodium for the past month.-taste changes/loss of smell and appetite (06/22/19) resolved 08/21/19 back to baseline-ongoing occasional gas pains Gr1, intermittent relieved with flatus?Resp:-DOE with walking long distances >200 yards; stable-no cough?Extremities:- local muscle injection site pain?Gr1?achy pain x3 days?(this occurred after his last dose again)- lower extremity swelling is?significantly?improved from his chronic edema?Metabolic-?glucose levels higher in the last 24 horus;?long-acting?insulin dose?now?60 units qhs- fatigue?(Gr1)?since 3/11 which remains stable. ?Has trouble getting work done at home. ?Takes 20-30 min naps about 1-2/days. ?Has difficulty going to sleep; still taking his chronic ambien.?He continues to go to the casinos. ?Denies any fever. ?He has completed his COVID vaccinations.  He plans to go again to [facility] after his planned C8 for 3 weeks.ECOG:  1Pain: reported as 0 today (does have hx of chronic neuropathy pain)Review of Systems:No fevers, chills, night sweats, lightheadedness, HA, CP, palpitations, wheezing, abd pain, constipation, blood in the urine or stools, dysuria.  Diabetic neuropathy in the hands and feet stable.  He has a prior hx of intermittent loose stools, chronic, without blood depending on his diet (unchanged and often isolated to a specific restaurant he likes to visit with his buddies).  Positive responses on ROS as noted in interval hx.PAST MEDICAL HISTORY:Past Medical History: Diagnosis Date ? Diabetes mellitus (HC Code)  ? High cholesterol  ? Malignant melanoma of torso excluding breast (HC Code)  SURGICAL HISTORY:Past Surgical History: Procedure Laterality Date ? COLONOSCOPY   ? CORONARY ANGIOPLASTY WITH STENT PLACEMENT Bilateral  ? laryngoscopy with biopsy  02/20/2019 ? ROTATOR CUFF REPAIR Left 1999 ? TONSILLECTOMY    age 75 ? TOTAL HIP ARTHROPLASTY Right 2002 FAMILY HISTORY:Family History Problem Relation Age of Onset ? Heart disease Mother  ? Bladder cancer Father 22 ? Kidney cancer Sister  ? Leukemia Brother 59 ? No Known Problems Daughter  ? No Known Problems Son  SOCIAL HISTORY:Social History Socioeconomic History ? Marital status: Divorced   Spouse name: Not on file ? Number of children: Not on file ? Years of education: Not on file ? Highest education level: Not on file Occupational History ? Not on file Social Needs ? Financial resource strain: Not on file ? Food insecurity   Worry: Not on file   Inability: Not on file ? Transportation needs   Medical: Not on file   Non-medical: Not on file Tobacco Use ? Smoking status: Never Smoker ? Smokeless tobacco: Never Used Substance and Sexual Activity ? Alcohol use: Not Currently ? Drug use: No ? Sexual activity: Not on file Lifestyle ? Physical activity   Days per week: Not on file   Minutes per session: Not on file ? Stress: Not on file Relationships ? Social Manufacturing systems engineer on phone: Not on file   Gets together: Not on file   Attends religious service: Not on file   Active member of club or organization: Not on file   Attends meetings of clubs or organizations: Not on file   Relationship status: Not on file ? Intimate partner violence   Fear of current or ex partner: Not on file   Emotionally  abused: Not on file   Physically abused: Not on file   Forced sexual activity: Not on file Other Topics Concern ? Not on file Social History Narrative  Divorced, works for [employer] in [place] will be retiring 03/17/19.  Has 1 son in [place] and 1 daughter in [place]. Llives in [place]  ALLERGIES:No Known AllergiesMEDICATIONS:Current Outpatient Medications Medication Sig ? acetaminophen (TYLENOL) 325 mg tablet Take 3 tablets (975 mg total) by mouth every 6 (six) hours as needed (mild to moderate pain). ? ALPRAZolam (XANAX) 0.5 mg tablet 0.5 mg.  ? aspirin 81 MG EC tablet Take 81 mg by mouth nightly.  ? bedside commode 3 in 1 commode (Patient not taking: Reported on 03/07/2019) ? docusate sodium (COLACE) 250 mg capsule Take 1 capsule (250 mg total) by mouth 2 (two) times daily as needed for constipation. (Patient not taking: Reported on 08/21/2019) ? FREESTYLE LIBRE 14 DAY sensor kit 1 each by Other route every 14 (fourteen) days. ? glipiZIDE (GLUCOTROL XL) 10 MG 24 hr tablet Take 10 mg by mouth nightly.  ? guaiFENesin (ROBITUSSIN) 100 mg/5 mL Liqd Take 20 mLs (400 mg total) by mouth every 4 (four) hours as needed for cough. (Patient not taking: Reported on 08/21/2019) ? ibuprofen (ADVIL,MOTRIN) 200 mg tablet Take 2 tablets (400 mg total) by mouth every 6 (six) hours as needed (alternate with tylenol for mild to moderate pain). ? olopatadine (PATANOL) 0.1 % ophthalmic solution as needed (takes for eye irritation as needed).  ? simvastatin (ZOCOR) 20 MG tablet Take 20 mg by mouth nightly.  ? TOUJEO SOLOSTAR U-300 300 unit/mL (1.5 mL) pen Inject 30 Units under the skin nightly. ? walker Misc Use as directed. ? zolpidem (AMBIEN) 10 mg tablet nightly.. No current facility-administered medications for this visit.  Facility-Administered Medications Ordered in Other Visits Medication ? albuterol neb sol 2.5 mg/3 mL (0.083%) (PROVENTIL,VENTOLIN) ? diphenhydrAMINE (BENADRYL) injection 50 mg ? EPINEPHrine (EPI-PEN) auto-injector 0.3 mg ? EPINEPHrine (EPI-PEN) auto-injector 0.3 mg ? famotidine (PF) (PEPCID) 20 mg in water for injection, sterile 5 mL Injection ? HIC [id number] (XLKG-4010) investigational study drug 1 mg ? HIC [id number] Pembrolizumab (MK-3475) 200 mg in sodium chloride 0.9% 100 mL investigational study drug ? hydrocortisone sodium succinate (Solu-CORTEF) injection 100 mg ? meperidine PF (DEMEROL) 25 mg in sodium chloride 0.9% PF 2.5 mL Injection ? sodium chloride 0.9 % (new bag) bolus 500 mL ? sodium chloride 0.9 % flush 10 mL ? sodium chloride 0.9% infusion VITALS: BP (!) 148/87 (Site: r a, Position: Sitting, Cuff Size: Medium)  - Pulse 81  - Temp 97.3 ?F (36.3 ?C) (Temporal)  - Resp 16  - Wt 116.1 kg  - SpO2 97%  - BMI 36.55 kg/m?  PHYSICAL EXAM:Gen:  NAD, pleasant HEENT:  EOMI, PEARLLN:  No cervical, supraclavicular, or axillary LAD.  CV:  RRR, no m/r/gPulm:  CTA b/lAbd:  Soft, no tenderness, no guarding or rebound, no organomegaly, no ascitesExt:  WWP, 1+ symmetric edema in the ankles.  Lymphedema present in the right arm, unchanged.Neuro:   Grossly intactPsych:  Normal mood and affectSkin:  Scar on the right upper back well healed. Small seromas at the medial and lateral edges of the scar stable.  Right axillary scar well healed without concerning nodularity.  LABS:Results for orders placed or performed during the hospital encounter of 09/11/19 Urinalysis-macroscopic w/reflex microscopic Result Value Ref Range  Clarity, UA Clear Clear  Color, UA Yellow Yellow  Specific Gravity, UA 1.026 (H) 1.005 - 1.020  pH,  UA 6.0 5.5 - 7.5  Protein, UA 2+ (A) Negative-Trace  Glucose, UA 3+ (A) Negative  Blood, UA Negative Negative  Bilirubin, UA Negative Negative  Ketones, UA Negative Negative  Leukocytes, UA Negative Negative  Nitrite, UA Negative Negative  Urobilinogen, UA 0.2 <=2.0 EU/dL PT/INR and PTT     (BH GH L YH) Result Value Ref Range  Prothrombin Time 10.4 9.9 - 11.5 seconds  INR 0.97 0.92 - 1.08  PTT 24.1 22.2 - 29.6 seconds T4, free Result Value Ref Range  Free T4 0.95 See Comment ng/dL T3 Result Value Ref Range  T3, Total 96.1 See Comment ng/dL TSH Result Value Ref Range  Thyroid Stimulating Hormone 4.340 (H) See Comment ?IU/mL CK     (BH GH L LMW YH) Result Value Ref Range  Total CK 77 11 - 204 U/L Lactate dehydrogenase Result Value Ref Range  LD 172 122 - 241 U/L Phosphorus     (BH GH L LMW YH) Result Value Ref Range  Phosphorus 3.3 2.2 - 4.5 mg/dL MAGNESIUM Result Value Ref Range  Magnesium 1.7 1.7 - 2.4 mg/dL Comprehensive metabolic panel Result Value Ref Range  Sodium 136 136 - 144 mmol/L  Potassium 4.8 3.3 - 5.1 mmol/L  Chloride 99 98 - 107 mmol/L  CO2 26 20 - 30 mmol/L  Anion Gap 11 7 - 17  Glucose 335 (H) 70 - 100 mg/dL  BUN 22 8 - 23 mg/dL  Creatinine 1.61 0.96 - 1.30 mg/dL  Calcium 9.3 8.8 - 04.5 mg/dL  BUN/Creatinine Ratio 40.9 (H) 8.0 - 23.0  Total Protein 7.8 6.6 - 8.7 g/dL  Albumin 3.8 3.6 - 4.9 g/dL  Total Bilirubin 0.3 <=8.1 mg/dL  Alkaline Phosphatase 85 9 - 122 U/L  Alanine Aminotransferase (ALT) 23 9 - 59 U/L  Aspartate Aminotransferase (AST) 22 10 - 35 U/L  Globulin 4.0 (H) 2.3 - 3.5 g/dL  A/G Ratio 1.0 1.0 - 2.2  AST/ALT Ratio 1.0 See Comment  eGFR (Afr Amer) >60 >60 mL/min/1.20m2  eGFR (NON African-American) >60 >60 mL/min/1.73m2 CBC auto differential Result Value Ref Range  WBC 8.4 4.0 - 10.0 x1000/?L  RBC 4.7 3.8 - 5.9 M/?L  Hemoglobin 13.3 12.0 - 18.0 g/dL  Hematocrit 19.1 47.8 - 52.0 %  MCV 85.9 78.0 - 94.0 fL  MCHC 33.2 31.0 - 36.0 g/dL  RDW-CV 29.5 (H) 62.1 - 14.5 %  Platelets 275 140 - 440 x1000/?L  MPV 11.2 (H) 6.0 - 11.0 fL  ANC (Abs Neutrophil Count) 5.7 1.0 - 11.0 x 1000/?L  Neutrophils 67.5 37.0 - 84.0 %  Lymphocytes 22.1 8.0 - 49.0 %  Absolute Lymphocyte Count 1.9 1.0 - 4.0 x 1000/?L  Monocytes 7.2 4.0 - 15.0 %  Monocyte Absolute Count 0.6 0.0 - 2.0 x 1000/?L  Eosinophils 2.1 0.0 - 7.0 %  Eosinophil Absolute Count 0.2 0.0 - 1.0 x 1000/?L  Basophil 0.7 0.0 - 4.0 %  Basophil Absolute Count 0.1 (H) 0.0 - 0.0 x 1000/?L  Immature Granulocytes 0.4 0.0 - 3.0 %  Absolute Immature Granulocyte Count 0.0 0.0 - 0.3 x 1000/?L  nRBC 0.0 0.0 - 1.0 %  Absolute nRBC 0.0 0.0 - 0.0 x 1000/?L  MCH 28.5 27.0 - 31.0 pg Urine microscopic     (BH GH LMW YH) Result Value Ref Range  WBC/HPF 3-5 0 - 5 /HPF  RBC/HPF 3-5 (A) 0 - 2 /HPF  Mucus, UA Few None-Few /HPF IMAGING/PATH:[place] abd/pelvis 07/28/19:  NEDCT chest 07/28/19:  NED; new small bilateral  pleural effusions.  Stable 4 mm LLL nodule.MRI Brain and [place] CAP 04/17/19:  NEDPET 02/07/19:Head And Neck: Base of tongue and preepiglottic FDG activity is nonspecific (SUV max 6.6).Mildly FDG avid cervical lymph nodes are nonspecific, for example:*  Left level 2 (Hodgkins image 874, 1.4 x 0.8 cm, SUV max 2.6)*  Right level 2 ([place] image 878, 1.4 x 1 cm, SUV max 2.1)CHEST:Hypermetabolic mass overlying the right posterior 10th rib is consistent with malignancy, and involves the skin, subcutaneous fat, extending to the latissimus dorsi muscle (SUV max 15, 6.3 x 3 x 5.9 cm).Two hypermetabolic right axillary lymph nodes are highly suspicious for metastases (up to SUV max 8.3, 2.4 x 1.7 cm). Multiple additional smaller right subpectoral lymph nodes are minimally FDG avid, and are indeterminate (SUV max 1.2, subcentimeter).Azygos pulmonary fissure is incidentally noted. No suspicious pulmonary nodule is identified, although pulmonary detail is partially obscured by breathing motion. Coronary arteries and the aortic valve are calcified. The heart is on the upper bound of normal size. Nonspecific pattern of myocardial FDG activity. No mediastinal lymphadenopathy is identified.Abdomen/Pelvis: Diffuse hepatic and splenic heterogeneity most likely represents artifactual image noise, however this could conceal any small lesions.Multiple colonic diverticuli show no evidence of diverticulitis. Diffuse patchy colonic FDG activity is nonspecific, commonly inflammatory or related to a pharmacologic effect, however this could conceal any small lesions (SUV max 9 the sigmoid). Left renal cortical defect associated with a punctuate calcification likely represents scarring. Right renal calyceal calcifications likely represent nonobstructing stones (up to 0.5 cm).Normal appearance of the gallbladder, spleen, stomach, pancreas, adrenal glands, small bowel, decompressed urinary bladder.Streak artifacts arising from metallic right hip prostheses obscure nearby structures.Small retroperitoneal lymph nodes are not FDG avid, although below resolution of PET. Otherwise, no retroperitoneal, mesenteric, or pelvic lymphadenopathy is identified. Inguinal lymph nodes are nonspecific (SUV max 1.5), commonly inflammatory.MUSCULOSKELETAL:Heterogeneous marrow FDG activity is likely due to a combination of artifactual image noise, and marrow activation, however this reduces sensitivity for any small lesions (SUV max 4.6 in the sacrum). Right knee FDG activity associated with cruciate ligaments and synovium is likely degenerative or posttraumatic (SUV max 5), with small knee effusion (SUV max 3.3).IMPRESSION:1.  The accession number [id number] does not have any associated images in PACS at the time of dictation.  PET-[place] images associated with the accession number [id number] / [id number] were interpreted in this report.2.  Hypermetabolic mass overlying the right posterior 10th rib is consistent with malignancy, and involves the skin, subcutaneous fat, extending to the latissimus dorsi muscle.3.  Right axillary nodal metastases.4.  Multiple small right subpectoral lymph nodes are indeterminate; metastases are not excluded.5.  Base of tongue and preepiglottic FDG activity is nonspecific, with slight asymmetric fullness on the left. Direct visualization is recommended to exclude primary neoplasm.6.  Minimally FDG avid bilateral level 2 cervical lymph nodes are nonspecific, possibly inflammatory.MRI brain 01/30/19:There is no intracranial hemorrhage or major vascular distribution infarct.No abnormal enhancement is seen.There is no mass effect, edema, or midline shift.The ventricles are symmetric and normal in size. Several scattered foci of T2/FLAIR hyperintense signal are noted in the periventricular and subcortical white matter, likely sequela of chronic small vessel ischemic disease. No extra-axial collection is seen.Prior lens replacements noted bilaterally.The paranasal sinuses are clear. Small right mastoid effusion noted.IMPRESSION:No evidence of intracranial metastatic disease.WLE and SLN 03/03/19:LYMPH NODES, RIGHT AXILLARY LEVELS 1, 2 AND 3, LYMPH NODE DISSECTION: ?  ? ? - ?THREE OF THIRTY-FIVE LYMPH NODES, POSITIVE FOR MELANOMA (3 OF 35) WITH LARGEST INVOLVED LYMPH NODE MEASURING  4.5 X 3 CM ? ? ?- LYMPHATIC INVASION IDENTIFIED ? ? ?- NO EXTRANODAL EXTENSION IDENTIFIED 1. ?SKIN, BACK, RIGHT UPPER, EXCISION: ? ? ? ?- MALIGNANT MELANOMA, SEE NOTE AND SYNOPTIC SUMMARY Note: Sections show a tumor composed of S100 positive epithelioid cells within the dermis and subcutaneous tissue. Cytokeratin AE1/AE3 and desmin are negative. A junctional component is not identified. The findings would be compatible with a primary dermal melanoma or a metastatic lesion. Clinical correlation is suggested. 2. ?SKIN, BACK, EXCISION: ? ? ? ?- BENIGN FIBROADIPOSE TISSUE AND SKELETAL MUSCLE SYNOPTIC SUMMARY MELANOMA OF THE SKIN Tumor Site: ? ? Back Laterality: ? ? Right Procedure: ? ? Primary excision Maximum Tumor Thickness (Depth): ? ? 30 mm Ulceration: ? ? Not identified Mitotic Rate (Mitoses/mm2): ? ? 2 Anatomic Level: ? ? V (Melanoma invades subcutis) Growth Phase: ? ? Vertical Histologic Type: ? ? Melanoma, NOS Tumor-Infiltrating Lymphocytes: ? ? Present, non-brisk Tumor Regression: ? ? Not identifed Lymphovascular Invasion: ? ? Present Microsatellitosis: ? ? Present Margins ? ? ?Peripheral Margins: ? ? Uninvolved Deep Margin: ? ? Uninvolved Stage (AJCC 8th Ed): ? ? pT4a Nx Base of the tongue and vallecula random biopsies 02/20/19: 1. ?TONGUE, LEFT, BASE, BIOPSY: ? ? ? ?- LYMPHOID TISSUE HYPERPLASIA ? ? ?- NEGATIVE FOR MALIGNANCY 2. ?THROAT, VALLECULA, BIOPSY: ? ?  ? - LYMPHOID TISSUE HYPERPLASIA AND FOCAL CHRONIC INFLAMMATION. SEE NOTE. ? ? ?- NEGATIVE FOR MALIGNANCY ? ? ? ? ? Note: Immunostain of Sox-10 to investigate focal pigment is negative, supporting above interpretation. 3. ?THROAT, LEFT VALLECULA, BIOPSY: ? ? ? ?- LYMPHOID TISSUE HYPERPLASIA ? ? ?- NEGATIVE FOR MALIGNANCY 4. ?THROAT, RIGHT VALLECULA, BIOPSY: ? ? ? ? ? ? - LYMPHOID TISSUE HYPERPLASIA AND FOCAL CHRONIC INFLAMMATION ? ? ?- NEGATIVE FOR MALIGNANCY 5. ?TONGUE, RIGHT BASE, BIOPSY: ? ? ? ? ? ? - LYMPHOID TISSUE HYPERPLASIA AND FOCAL CHRONIC INFLAMMATION ? ? ?- NEGATIVE FOR MALIGNANCY Pathology (reviewed at [facility]), collected 01/07/19:DIAGNOSIS: ? 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 Clinical pathological correlation is recommended. MICROSCOPIC DESCRIPTION: There are atypical cells in a nodule in the dermis. The cells are positive with SOX-10 and by report are negative with LCA, Kappa, Lambda, and a cytokeratin. The cells are also positive with S-100 by report.ASSESSMENT/PLAN:[name] is a 74 y.o. 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  Initially path read as metastatic melanoma, but further workup confirms that this is the primary site, not a metastatic lesion.  Initial biopsy was likely altered due to repeat I&Ds of the area.  He had a FBSE with his dermatologist and no additional primary lesions were identified.  He is otherwise in good health except for metabolic syndrome and diabetes-associated neuropathy.  PET 02/07/19 showed the main lesion on the right upper back extending to the latissimus dorsi muscle with involved right axillary nodes and nonspecific avidity in the BOT and preepiglottis for which direct visual evaluation was recommended and done 02/11/19 and random biopsies 02/20/19 without concerns for malignancy. Colonoscopy was also done to evaluate stranding in the transverse colon, which was negative for malignancy on exam.  Now s/p WLE and LND with Dr. Duanne Moron on 03/03/19 showing a stage IIIC melanoma that was 30 mm thick, positive for LVI and microsatellites with 3/25 positive LNs.  Given the thickness of his  primary melanoma and microsatellitosis, we felt that his melanoma-specific survival is closer to a stage IIID:  60% over 10 years with stage IIIC disease versus 24% with stage IIID.  He does not have a BRAF mutation, so SOC options are immune therapy at this point versus the Moderna clinical trial (?A Phase 2 Randomized Study of Adjuvant Immunotherapy with the Personalized Cancer Vaccine [phone] and Pembrolizumab Versus Pembrolizumab Alone After Complete Resection of High-Risk Melanoma?) vs active surveillance (for which he would be extremely high risk for relapsing).  He opted for the Moderna trial and signed consent on 03/27/19; tissue passed QC inspection.  Jason Rowland was randomized to the [facility] with vaccine arm of the trial.  He has experienced some weight loss due to loss of taste/smell/appetite after initiation the clinical trial, which is now resolved with weight gain. He has stable right arm lymphedema but declines any lymphedema therapy or sleeves.  He also had some G2 diarrhea after C4, which resolved with Imodium and note requiring anti-diarrheals by C7.  - labs reviewed today.  LDH wnl. LFTs wnl.  Bilis are normal.  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  Ok for treatment C7 today. Will get pembrolizumab with vaccine.- [place] CAP 4/12 NED; surveillance scans every 12 weeks- routine f/u with dermatologist every 4-6 months.  Last seen 04/2019; he reports having an appt coming up in July.- RTC in 3 weeks per trial schedule if able to treat today; instructed to call if any issues or questions in the interim30 min were spent in direct patient contact.  He has our clinic contact information and instructed to call if questions.  Drema Halon, MD, PhDMedical Oncology

## 2019-09-11 NOTE — Progress Notes
PT to clinic for D1C7 mRNA-4157/Pembrolizumab. PIV placed and labs drawn. Flushes, +blood return. PT seen by MD Laveda Norman, cleared for treatment. PT offers no new complaints. Research labs drawn. MRNA administered in left deltoid at 1410. Pembrolizumab up 1415, down at 1445. Flushed with 30cc NS per protocol.  PIV flushed, +blood return, removed. Left in stable condition.

## 2019-10-01 ENCOUNTER — Encounter: Admit: 2019-10-01 | Payer: PRIVATE HEALTH INSURANCE

## 2019-10-02 ENCOUNTER — Encounter: Admit: 2019-10-02 | Payer: PRIVATE HEALTH INSURANCE

## 2019-10-02 ENCOUNTER — Encounter: Admit: 2019-10-02 | Payer: PRIVATE HEALTH INSURANCE | Attending: Medical Oncology

## 2019-10-02 ENCOUNTER — Inpatient Hospital Stay: Admit: 2019-10-02 | Discharge: 2019-10-02 | Payer: BLUE CROSS/BLUE SHIELD

## 2019-10-02 ENCOUNTER — Ambulatory Visit: Admit: 2019-10-02 | Payer: BLUE CROSS/BLUE SHIELD

## 2019-10-02 DIAGNOSIS — Z794 Long term (current) use of insulin: Secondary | ICD-10-CM

## 2019-10-02 DIAGNOSIS — I890 Lymphedema, not elsewhere classified: Secondary | ICD-10-CM

## 2019-10-02 DIAGNOSIS — Z96641 Presence of right artificial hip joint: Secondary | ICD-10-CM

## 2019-10-02 DIAGNOSIS — R141 Gas pain: Secondary | ICD-10-CM

## 2019-10-02 DIAGNOSIS — C4359 Malignant melanoma of other part of trunk: Secondary | ICD-10-CM

## 2019-10-02 DIAGNOSIS — R5383 Other fatigue: Secondary | ICD-10-CM

## 2019-10-02 DIAGNOSIS — Z006 Encounter for examination for normal comparison and control in clinical research program: Secondary | ICD-10-CM

## 2019-10-02 DIAGNOSIS — Z8051 Family history of malignant neoplasm of kidney: Secondary | ICD-10-CM

## 2019-10-02 DIAGNOSIS — Z8052 Family history of malignant neoplasm of bladder: Secondary | ICD-10-CM

## 2019-10-02 DIAGNOSIS — E7800 Pure hypercholesterolemia, unspecified: Secondary | ICD-10-CM

## 2019-10-02 DIAGNOSIS — G629 Polyneuropathy, unspecified: Secondary | ICD-10-CM

## 2019-10-02 DIAGNOSIS — E785 Hyperlipidemia, unspecified: Secondary | ICD-10-CM

## 2019-10-02 DIAGNOSIS — Z5112 Encounter for antineoplastic immunotherapy: Secondary | ICD-10-CM

## 2019-10-02 DIAGNOSIS — E1142 Type 2 diabetes mellitus with diabetic polyneuropathy: Secondary | ICD-10-CM

## 2019-10-02 DIAGNOSIS — M7989 Other specified soft tissue disorders: Secondary | ICD-10-CM

## 2019-10-02 DIAGNOSIS — E8881 Metabolic syndrome: Secondary | ICD-10-CM

## 2019-10-02 DIAGNOSIS — Z791 Long term (current) use of non-steroidal anti-inflammatories (NSAID): Secondary | ICD-10-CM

## 2019-10-02 DIAGNOSIS — R0609 Other forms of dyspnea: Secondary | ICD-10-CM

## 2019-10-02 DIAGNOSIS — E119 Type 2 diabetes mellitus without complications: Secondary | ICD-10-CM

## 2019-10-02 DIAGNOSIS — Z955 Presence of coronary angioplasty implant and graft: Secondary | ICD-10-CM

## 2019-10-02 DIAGNOSIS — Z79899 Other long term (current) drug therapy: Secondary | ICD-10-CM

## 2019-10-02 DIAGNOSIS — Z8249 Family history of ischemic heart disease and other diseases of the circulatory system: Secondary | ICD-10-CM

## 2019-10-02 DIAGNOSIS — C7989 Secondary malignant neoplasm of other specified sites: Secondary | ICD-10-CM

## 2019-10-02 DIAGNOSIS — Z806 Family history of leukemia: Secondary | ICD-10-CM

## 2019-10-02 DIAGNOSIS — Z7982 Long term (current) use of aspirin: Secondary | ICD-10-CM

## 2019-10-02 DIAGNOSIS — R609 Edema, unspecified: Secondary | ICD-10-CM

## 2019-10-02 LAB — CBC WITH AUTO DIFFERENTIAL
BKR WAM ABSOLUTE IMMATURE GRANULOCYTES: 0 x 1000/ÂµL (ref 0.0–0.3)
BKR WAM ABSOLUTE LYMPHOCYTE COUNT: 1.8 x 1000/ÂµL (ref 1.0–4.0)
BKR WAM ABSOLUTE NRBC: 0 x 1000/ÂµL (ref 0.0–0.0)
BKR WAM ANALYZER ANC: 5.8 x 1000/ÂµL (ref 1.0–11.0)
BKR WAM BASOPHIL ABSOLUTE COUNT: 0.1 x 1000/ÂµL — ABNORMAL HIGH (ref 0.0–0.0)
BKR WAM BASOPHILS: 0.8 % (ref 0.0–4.0)
BKR WAM EOSINOPHIL ABSOLUTE COUNT: 0.2 x 1000/ÂµL (ref 0.0–1.0)
BKR WAM EOSINOPHILS: 2.6 % (ref 0.0–7.0)
BKR WAM HEMATOCRIT: 42.1 % (ref 37.0–52.0)
BKR WAM HEMOGLOBIN: 13.5 g/dL (ref 12.0–18.0)
BKR WAM IMMATURE GRANULOCYTES: 0.5 % (ref 0.0–3.0)
BKR WAM LYMPHOCYTES: 21.3 % (ref 8.0–49.0)
BKR WAM MCH (PG): 27.7 pg (ref 27.0–31.0)
BKR WAM MCHC: 32.1 g/dL (ref 31.0–36.0)
BKR WAM MCV: 86.4 fL (ref 78.0–94.0)
BKR WAM MONOCYTE ABSOLUTE COUNT: 0.6 x 1000/ÂµL (ref 0.0–2.0)
BKR WAM MONOCYTES: 6.6 % (ref 4.0–15.0)
BKR WAM MPV: 10.8 fL (ref 6.0–11.0)
BKR WAM NEUTROPHILS: 68.2 % (ref 37.0–84.0)
BKR WAM NUCLEATED RED BLOOD CELLS: 0 % (ref 0.0–1.0)
BKR WAM PLATELETS: 299 x1000/ÂµL (ref 140–440)
BKR WAM RDW-CV: 15.3 % — ABNORMAL HIGH (ref 11.5–14.5)
BKR WAM RED BLOOD CELL COUNT: 4.9 M/ÂµL (ref 3.8–5.9)
BKR WAM WHITE BLOOD CELL COUNT: 8.5 x1000/ÂµL (ref 4.0–10.0)

## 2019-10-02 LAB — COMPREHENSIVE METABOLIC PANEL
BKR A/G RATIO: 1.1 (ref 1.0–2.2)
BKR ALANINE AMINOTRANSFERASE (ALT): 22 U/L (ref 9–59)
BKR ALBUMIN: 4.1 g/dL (ref 3.6–4.9)
BKR ALKALINE PHOSPHATASE: 85 U/L (ref 9–122)
BKR ANION GAP: 12 (ref 7–17)
BKR ASPARTATE AMINOTRANSFERASE (AST): 20 U/L (ref 10–35)
BKR AST/ALT RATIO: 0.9
BKR BILIRUBIN TOTAL: 0.3 mg/dL (ref <=1.2)
BKR BLOOD UREA NITROGEN: 18 mg/dL (ref 8–23)
BKR BUN / CREAT RATIO: 23.1 — ABNORMAL HIGH (ref 8.0–23.0)
BKR CALCIUM: 9.6 mg/dL (ref 8.8–10.2)
BKR CHLORIDE: 100 mmol/L (ref 98–107)
BKR CO2: 26 mmol/L (ref 20–30)
BKR CREATININE: 0.78 mg/dL (ref 0.40–1.30)
BKR EGFR (AFR AMER): >60 mL/min/{1.73_m2} (ref >60)
BKR EGFR (NON AFRICAN AMERICAN): >60 mL/min/{1.73_m2} (ref >60)
BKR GLOBULIN: 3.8 g/dL — ABNORMAL HIGH (ref 2.3–3.5)
BKR GLUCOSE: 281 mg/dL — ABNORMAL HIGH (ref 70–100)
BKR POTASSIUM: 4.3 mmol/L (ref 3.3–5.1)
BKR PROTEIN TOTAL: 7.9 g/dL (ref 6.6–8.7)
BKR SODIUM: 138 mmol/L (ref 136–144)

## 2019-10-02 LAB — MAGNESIUM: BKR MAGNESIUM: 1.6 mg/dL — ABNORMAL LOW (ref 1.7–2.4)

## 2019-10-02 LAB — TSH: BKR THYROID STIMULATING HORMONE: 3.49 u[IU]/mL

## 2019-10-02 LAB — CK     (BH GH L LMW YH): BKR CREATINE KINASE TOTAL: 66 U/L (ref 11–204)

## 2019-10-02 LAB — PHOSPHORUS     (BH GH L LMW YH): BKR PHOSPHORUS: 3.2 mg/dL (ref 2.2–4.5)

## 2019-10-02 LAB — PT/INR AND PTT (BH GH L LMW YH)
BKR INR: 0.97 (ref 0.92–1.08)
BKR PARTIAL THROMBOPLASTIN TIME: 24.3 s (ref 22.2–29.6)
BKR PROTHROMBIN TIME: 10.4 s (ref 9.9–11.5)

## 2019-10-02 LAB — T4, FREE: BKR FREE T4: 1.01 ng/dL

## 2019-10-02 LAB — T3: BKR T3 TOTAL: 79.1 ng/dL

## 2019-10-02 LAB — LACTATE DEHYDROGENASE: BKR LACTATE DEHYDROGENASE: 146 U/L (ref 122–241)

## 2019-10-02 MED ORDER — FAMOTIDINE 4 MG/ML IN STERILE WATER (ADULT)
Freq: Once | INTRAVENOUS | Status: DC | PRN
Start: 2019-10-02 — End: 2019-10-07

## 2019-10-02 MED ORDER — SODIUM CHLORIDE 0.9 % INTRAVENOUS SOLUTION
INTRAVENOUS | Status: DC | PRN
Start: 2019-10-02 — End: 2019-10-07

## 2019-10-02 MED ORDER — DIPHENHYDRAMINE 50 MG/ML INJECTION SOLUTION
50 mg/mL | Freq: Once | INTRAVENOUS | Status: DC | PRN
Start: 2019-10-02 — End: 2019-10-07

## 2019-10-02 MED ORDER — ALBUTEROL SULFATE 2.5 MG/3 ML (0.083 %) SOLUTION FOR NEBULIZATION
2.5 mg /3 mL (0.083 %) | RESPIRATORY_TRACT | Status: DC | PRN
Start: 2019-10-02 — End: 2019-10-02

## 2019-10-02 MED ORDER — EPINEPHRINE 0.3 MG/0.3 ML INJECTION, AUTO-INJECTOR
0.3 mg/ mL | INTRAMUSCULAR | Status: DC | PRN
Start: 2019-10-02 — End: 2019-10-07

## 2019-10-02 MED ORDER — HIC 2000025461 (MRNA-4157)
Freq: Once | INTRAMUSCULAR | Status: CP
Start: 2019-10-02 — End: ?
  Administered 2019-10-02: 17:00:00 1.000 mL via INTRAMUSCULAR

## 2019-10-02 MED ORDER — SODIUM CHLORIDE 0.9 % BOLUS (NEW BAG)
0.9 % | Freq: Once | INTRAVENOUS | Status: DC | PRN
Start: 2019-10-02 — End: 2019-10-07

## 2019-10-02 MED ORDER — HIC 2000025461 PEMBROLIZUMAB (MK-3475)
Freq: Once | INTRAVENOUS | Status: CP
Start: 2019-10-02 — End: ?
  Administered 2019-10-02: 17:00:00 100.000 mL/h via INTRAVENOUS

## 2019-10-02 MED ORDER — MEPERIDINE 25 MG/2.5 ML IN 0.9% SODIUM CHLORIDE
Freq: Once | INTRAVENOUS | Status: DC | PRN
Start: 2019-10-02 — End: 2019-10-07

## 2019-10-02 MED ORDER — HYDROCORTISONE SODIUM SUCCINATE 100 MG SOLUTION FOR INJECTION
100 mg | Freq: Once | INTRAVENOUS | Status: DC | PRN
Start: 2019-10-02 — End: 2019-10-07

## 2019-10-02 NOTE — Progress Notes
Pt arrived to clinic for [facility] [id number] + pembrolizumabPIV placed and labs drawn on 8th floor. Patient evaluated and cleared for treatment with Dr Laveda Norman and Jayme Cloud [facility].Nursing assessment completed in flowsheet. VSS. Patient has no complaints. HIC [id number] ([id number]) investigational study drug 1mg  administered IM as 2 injections of 0.5mg /0.7ml, both in right deltoid muscle. Patient tolerated without issue.[id number] Pembrolizumab ([id number]) 200mg  infused over 30 minutes - pt tolerated without issue. PIV flushed and removed. Dry dressing in placePatient discharged home in stable condition.Patient scheduled to return for C9 on 7/8

## 2019-10-02 NOTE — Progress Notes
NAME:  Jason Rowland (1945-07-14)AGE: 74 y.o. MRN:  [id number] PROVIDER: Drema Halon, MD, PhDDATE OF SERVICE: 10/02/2019 [facility] [place]  REASON FOR VISIT:  C8D1 of HIC 25461ONC DX:  Invasive melanoma right mid back, stage IIIC (pT4a N3 M0); 30 mm with microsatellitosis, mitotic rate 2 per mm2.0% tumor and immune cell PD-L1 staining50-gene panel:  DNA VARIANT DETECTED ? ? 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 ? 74% TREATMENT HX:- Adjuvant Moderna clinical trial (?A Phase 2 Randomized Study of Adjuvant Immunotherapy with the Personalized Cancer Vaccine [phone] and Pembrolizumab Versus Pembrolizumab Alone After Complete Resection of High-Risk Melanoma? Protocol [id number], HIC 65784) C1D1 05/08/19.  Randomized to the pembrolizumab and vaccine arm. Onc Hx: Jason Rowland (prefers to be called Jason Rowland) is a 74 y.o. male who worked for 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  He reports it appeared approx 10 months prior; his PCP originally thought it was a pimple.  Dr. Lorn Junes at [facility] later evaluated it and also thought it was a pimple and injected it with intralesional kenalog x 2 and oral cephalexin without improvement.  The lesion increased in size and burned whenever he leaned against anything.  Denies night sweats and weight loss.  Lucie Leather did a I&D on 01/07/19  with only slight bloody discharge, so a punch biopsy was obtain with path showing metastatic melanoma.  On 01/20/19, additional biopsies were done to try to evaluate a primary site, but we do not have this tissue.  Per patient report, these biopsies were negative. MRI brain 01/30/19 without intracranial disease.  PET done 02/07/19 showing preepiglottic FDG activity s/p ENT evaluation which was negative for any visible malignancy.  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 LDH elevated to 251 at diagnosis. His case was presented at the [facility]oma tumor board on 02/13/19 and consensus was to ask ENT for random biopsies of the BOT and pre-epiglottic PET-avid areas, as we do not want to miss a head and neck cancer but otherwise not felt to be related to his melanoma.  The cervical LN were not thought to be related.  He should also have gastroenterology evaluation for a PET-avid stranding lesion in the transverse colon.  Dr. Landis Gandy (ENT) performed BOT and vallecula random biopsies 02/20/19 which only showed chronic inflammation and no malignancy.  He had a colonoscopy by Dr. Linton Flemings on 02/27/19 showing only a tubular adenoma in the sigmoid colon.  Overall, it was felt that his right mid back lesion is the site of his primary disease, and the metastatic melanoma pathology interpretation may have been complicated due to repeat I&Ds and steroid injections that may have altered the architecture of the primary lesion.  In the absence of any confirmed metastatic disease, he underwent WLE and LND with Dr. Duanne Moron on 03/03/19.  Final path showed a 30 mm thick melanoma, 2 mitoses/mm2, non-brisk TILs, no tumor regression, LVI+, microsatellites+, negative margins, and 3/35 lymph nodes (largest LN 4.5 x 3 cm) were positive for melanoma with presence of lymphatic invasion without extranodal extension.He uses a cane sometimes as he has some balance issues due to neuropathy in his feet.  Otherwise, he is fully functional, active, and independent in ADLs/iADLs.  No hx of autoimmune disease.03/27/19 signed consent for the Moderna trial; randomized to [facility]  and vaccine arm.04/24/19 noted to have wound dehiscence, non-infected, healing by secondary intent.  Re-instated VNA services to assist with dressing changes and packing.  Wound completely healed by 06/20/19.Interval ZO:XWRU returns for follow-up today and C8 of his clinical trial.  He will get pembrolizumab + his personalized vaccine today.Since his last treatment 3 weeks ago, he notes the following?issues:?GI:-Diarrhea for 1 day 1 week ago, which resolved with 1 dose of immodium-taste changes/loss of smell and appetite (06/22/19) resolved 08/21/19 back to baseline.  Has some recurrence of sx since last week (09/25/19).-ongoing occasional gas pains Gr1, intermittent relieved with flatus - stable?Resp:-DOE with walking long distances about a quarter of a mile; stable (pre-dated tx on trial)-no cough?Extremities:- local muscle injection site pain?Gr1?achy pain x3 days?(this occurred after his last dose again)- lower extremity swelling is?stable from his chronic edema?Metabolic-?glucose levels improved lately around 150s fasting;?long-acting?insulin dose?60 units qhs- fatigue?(Gr1)?since 3/11 which remains stable. ?Has trouble getting work done at home. ?Takes 20-30 min naps about 1-2/days. ?Has difficulty going to sleep; still taking his chronic ambien.  Sometimes up until 3 AM.?He continues to go to the casinos. ?Denies any fever. ?He has completed his COVID vaccinations.  He plans to go again to [facility] 6/24 - 7/6.He possibly will have a tooth extractration (lower left tooth) and was given an Rx of amoxillicin 875 mg, 1 tablet BID for 14 doses. He isn't sure whether he will ultimately have the procedure done but wanted to make sure the Rx would be ok while he is on the trial.ECOG:  1Pain: reported as 0 today (does have hx of chronic neuropathy pain)Review of Systems:No fevers, chills, night sweats, lightheadedness, HA, CP, palpitations, wheezing, abd pain, constipation, blood in the urine or stools, dysuria.  Diabetic neuropathy in the hands and feet stable.  He has a prior hx of intermittent loose stools, chronic, without blood depending on his diet (unchanged and often isolated to a specific restaurant he likes to visit with his buddies).  Positive responses on ROS as noted in interval hx.PAST MEDICAL HISTORY:Past Medical History: Diagnosis Date ? Diabetes mellitus (HC Code)  ? High cholesterol  ? Malignant melanoma of torso excluding breast (HC Code)  SURGICAL HISTORY:Past Surgical History: Procedure Laterality Date ? COLONOSCOPY   ? CORONARY ANGIOPLASTY WITH STENT PLACEMENT Bilateral  ? laryngoscopy with biopsy  02/20/2019 ? ROTATOR CUFF REPAIR Left 1999 ? TONSILLECTOMY    age 28 ? TOTAL HIP ARTHROPLASTY Right 2002 FAMILY HISTORY:Family History Problem Relation Age of Onset ? Heart disease Mother  ? Bladder cancer Father 28 ? Kidney cancer Sister  ? Leukemia Brother 44 ? No Known Problems Daughter  ? No Known Problems Son  SOCIAL HISTORY:Social History Socioeconomic History ? Marital status: Divorced   Spouse name: Not on file ? Number of children: Not on file ? Years of education: Not on file ? Highest education level: Not on file Occupational History ? Not on file Social Needs ? Financial resource strain: Not on file ? Food insecurity   Worry: Not on file   Inability: Not on file ? Transportation needs   Medical: Not on file   Non-medical: Not on file Tobacco Use ? Smoking status: Never Smoker ? Smokeless tobacco: Never Used Substance and Sexual Activity ? Alcohol use: Not Currently ? Drug use: No ? Sexual activity: Not on file Lifestyle ? Physical activity   Days per week: Not on file   Minutes per session: Not on file ? Stress: Not on file Relationships ? Social connections  Talks on phone: Not on file   Gets together: Not on file   Attends religious service: Not on file   Active member of club or organization: Not on file   Attends meetings of clubs or organizations: Not on file   Relationship status: Not on file ? Intimate partner violence   Fear of current or ex partner: Not on file   Emotionally abused: Not on file   Physically abused: Not on file   Forced sexual activity: Not on file Other Topics Concern ? Not on file Social History Narrative  Divorced, works for [employer] in [place] will be retiring 03/17/19.  Has 1 son in [place] and 1 daughter in [place]. Llives in [place]  ALLERGIES:No Known AllergiesMEDICATIONS:Current Outpatient Medications Medication Sig ? acetaminophen (TYLENOL) 325 mg tablet Take 3 tablets (975 mg total) by mouth every 6 (six) hours as needed (mild to moderate pain). ? ALPRAZolam (XANAX) 0.5 mg tablet 0.5 mg.  ? aspirin 81 MG EC tablet Take 81 mg by mouth nightly.  ? bedside commode 3 in 1 commode (Patient not taking: Reported on 03/07/2019) ? docusate sodium (COLACE) 250 mg capsule Take 1 capsule (250 mg total) by mouth 2 (two) times daily as needed for constipation. (Patient not taking: Reported on 08/21/2019) ? FREESTYLE LIBRE 14 DAY sensor kit 1 each by Other route every 14 (fourteen) days. ? glipiZIDE (GLUCOTROL XL) 10 MG 24 hr tablet Take 10 mg by mouth nightly.  ? ibuprofen (ADVIL,MOTRIN) 200 mg tablet Take 2 tablets (400 mg total) by mouth every 6 (six) hours as needed (alternate with tylenol for mild to moderate pain). ? olopatadine (PATANOL) 0.1 % ophthalmic solution as needed (takes for eye irritation as needed).  ? simvastatin (ZOCOR) 20 MG tablet Take 20 mg by mouth nightly.  ? TOUJEO SOLOSTAR U-300 300 unit/mL (1.5 mL) pen Inject 30 Units under the skin nightly. ? walker Misc Use as directed. ? zolpidem (AMBIEN) 10 mg tablet nightly.. No current facility-administered medications for this visit.  Facility-Administered Medications Ordered in Other Visits Medication ? diphenhydrAMINE (BENADRYL) injection 50 mg ? EPINEPHrine (EPI-PEN) auto-injector 0.3 mg ? EPINEPHrine (EPI-PEN) auto-injector 0.3 mg ? famotidine (PF) (PEPCID) 20 mg in water for injection, sterile 5 mL Injection ? HIC [id number] Pembrolizumab (MK-3475) 200 mg in sodium chloride 0.9% 100 mL investigational study drug ? hydrocortisone sodium succinate (Solu-CORTEF) injection 100 mg ? meperidine PF (DEMEROL) 25 mg in sodium chloride 0.9% PF 2.5 mL Injection ? sodium chloride 0.9 % (new bag) bolus 500 mL ? sodium chloride 0.9% infusion VITALS: BP (!) (P) 157/90 (Site: r a, Position: Sitting, Cuff Size: Large)  - Pulse (P) 79  - Temp (P) 97 ?F (36.1 ?C) (Temporal)  - Resp (P) 20  - Wt (P) 116.3 kg  - SpO2 (P) 98%  - BMI (P) 36.62 kg/m?  Repeat manual BP 152/82PHYSICAL EXAM:Gen:  NAD, pleasant HEENT:  EOMI, PEARLLN:  No cervical, supraclavicular, or axillary LAD.  CV:  RRR, no m/r/gPulm:  CTA b/lAbd:  Soft, no tenderness, no guarding or rebound, no organomegaly, no ascitesExt:  WWP, 1+ symmetric edema in the ankles.  Lymphedema present in the right arm, unchanged.Neuro:   Grossly intactPsych:  Normal mood and affectSkin:  Scar on the right upper back well healed. Small lateral skin puckering at the edges of the scar stable.  Some flaking skin/dryness in the middle portion of the scar.  Right axillary scar well healed without concerning nodularity.  LABS:Results for orders placed or performed during  the hospital encounter of 09/11/19 Urinalysis-macroscopic w/reflex microscopic Result Value Ref Range  Clarity, UA Clear Clear  Color, UA Yellow Yellow  Specific Gravity, UA 1.026 (H) 1.005 - 1.020  pH, UA 6.0 5.5 - 7.5  Protein, UA 2+ (A) Negative-Trace  Glucose, UA 3+ (A) Negative  Blood, UA Negative Negative  Bilirubin, UA Negative Negative  Ketones, UA Negative Negative  Leukocytes, UA Negative Negative  Nitrite, UA Negative Negative  Urobilinogen, UA 0.2 <=2.0 EU/dL PT/INR and PTT     (BH GH L YH) Result Value Ref Range  Prothrombin Time 10.4 9.9 - 11.5 seconds  INR 0.97 0.92 - 1.08 PTT 24.1 22.2 - 29.6 seconds T4, free Result Value Ref Range  Free T4 0.95 See Comment ng/dL T3 Result Value Ref Range  T3, Total 96.1 See Comment ng/dL TSH Result Value Ref Range  Thyroid Stimulating Hormone 4.340 (H) See Comment ?IU/mL CK     (BH GH L LMW YH) Result Value Ref Range  Total CK 77 11 - 204 U/L Lactate dehydrogenase Result Value Ref Range  LD 172 122 - 241 U/L Phosphorus     (BH GH L LMW YH) Result Value Ref Range  Phosphorus 3.3 2.2 - 4.5 mg/dL MAGNESIUM Result Value Ref Range  Magnesium 1.7 1.7 - 2.4 mg/dL Comprehensive metabolic panel Result Value Ref Range  Sodium 136 136 - 144 mmol/L  Potassium 4.8 3.3 - 5.1 mmol/L  Chloride 99 98 - 107 mmol/L  CO2 26 20 - 30 mmol/L  Anion Gap 11 7 - 17  Glucose 335 (H) 70 - 100 mg/dL  BUN 22 8 - 23 mg/dL  Creatinine 1.61 0.96 - 1.30 mg/dL  Calcium 9.3 8.8 - 04.5 mg/dL  BUN/Creatinine Ratio 40.9 (H) 8.0 - 23.0  Total Protein 7.8 6.6 - 8.7 g/dL  Albumin 3.8 3.6 - 4.9 g/dL  Total Bilirubin 0.3 <=8.1 mg/dL  Alkaline Phosphatase 85 9 - 122 U/L  Alanine Aminotransferase (ALT) 23 9 - 59 U/L  Aspartate Aminotransferase (AST) 22 10 - 35 U/L  Globulin 4.0 (H) 2.3 - 3.5 g/dL  A/G Ratio 1.0 1.0 - 2.2  AST/ALT Ratio 1.0 See Comment  eGFR (Afr Amer) >60 >60 mL/min/1.23m2  eGFR (NON African-American) >60 >60 mL/min/1.25m2 CBC auto differential Result Value Ref Range  WBC 8.4 4.0 - 10.0 x1000/?L  RBC 4.7 3.8 - 5.9 M/?L  Hemoglobin 13.3 12.0 - 18.0 g/dL  Hematocrit 19.1 47.8 - 52.0 %  MCV 85.9 78.0 - 94.0 fL  MCHC 33.2 31.0 - 36.0 g/dL  RDW-CV 29.5 (H) 62.1 - 14.5 %  Platelets 275 140 - 440 x1000/?L  MPV 11.2 (H) 6.0 - 11.0 fL  ANC (Abs Neutrophil Count) 5.7 1.0 - 11.0 x 1000/?L  Neutrophils 67.5 37.0 - 84.0 %  Lymphocytes 22.1 8.0 - 49.0 %  Absolute Lymphocyte Count 1.9 1.0 - 4.0 x 1000/?L  Monocytes 7.2 4.0 - 15.0 %  Monocyte Absolute Count 0.6 0.0 - 2.0 x 1000/?L  Eosinophils 2.1 0.0 - 7.0 %  Eosinophil Absolute Count 0.2 0.0 - 1.0 x 1000/?L  Basophil 0.7 0.0 - 4.0 %  Basophil Absolute Count 0.1 (H) 0.0 - 0.0 x 1000/?L  Immature Granulocytes 0.4 0.0 - 3.0 %  Absolute Immature Granulocyte Count 0.0 0.0 - 0.3 x 1000/?L  nRBC 0.0 0.0 - 1.0 %  Absolute nRBC 0.0 0.0 - 0.0 x 1000/?L  MCH 28.5 27.0 - 31.0 pg Urine microscopic     (BH GH LMW YH) Result Value Ref Range  WBC/HPF 3-5 0 - 5 /HPF  RBC/HPF 3-5 (A) 0 - 2 /HPF  Mucus, UA Few None-Few /HPF IMAGING/PATH:[place] abd/pelvis 07/28/19:  NEDCT chest 07/28/19:  NED; new small bilateral pleural effusions.  Stable 4 mm LLL nodule.MRI Brain and [place] CAP 04/17/19:  NEDPET 02/07/19:Head And Neck: Base of tongue and preepiglottic FDG activity is nonspecific (SUV max 6.6).Mildly FDG avid cervical lymph nodes are nonspecific, for example:*  Left level 2 ([place] image 874, 1.4 x 0.8 cm, SUV max 2.6)*  Right level 2 ([place] image 878, 1.4 x 1 cm, SUV max 2.1)CHEST:Hypermetabolic mass overlying the right posterior 10th rib is consistent with malignancy, and involves the skin, subcutaneous fat, extending to the latissimus dorsi muscle (SUV max 15, 6.3 x 3 x 5.9 cm).Two hypermetabolic right axillary lymph nodes are highly suspicious for metastases (up to SUV max 8.3, 2.4 x 1.7 cm). Multiple additional smaller right subpectoral lymph nodes are minimally FDG avid, and are indeterminate (SUV max 1.2, subcentimeter).Azygos pulmonary fissure is incidentally noted. No suspicious pulmonary nodule is identified, although pulmonary detail is partially obscured by breathing motion. Coronary arteries and the aortic valve are calcified. The heart is on the upper bound of normal size. Nonspecific pattern of myocardial FDG activity. No mediastinal lymphadenopathy is identified.Abdomen/Pelvis: Diffuse hepatic and splenic heterogeneity most likely represents artifactual image noise, however this could conceal any small lesions.Multiple colonic diverticuli show no evidence of diverticulitis. Diffuse patchy colonic FDG activity is nonspecific, commonly inflammatory or related to a pharmacologic effect, however this could conceal any small lesions (SUV max 9 the sigmoid). Left renal cortical defect associated with a punctuate calcification likely represents scarring. Right renal calyceal calcifications likely represent nonobstructing stones (up to 0.5 cm).Normal appearance of the gallbladder, spleen, stomach, pancreas, adrenal glands, small bowel, decompressed urinary bladder.Streak artifacts arising from metallic right hip prostheses obscure nearby structures.Small retroperitoneal lymph nodes are not FDG avid, although below resolution of PET. Otherwise, no retroperitoneal, mesenteric, or pelvic lymphadenopathy is identified. Inguinal lymph nodes are nonspecific (SUV max 1.5), commonly inflammatory.MUSCULOSKELETAL:Heterogeneous marrow FDG activity is likely due to a combination of artifactual image noise, and marrow activation, however this reduces sensitivity for any small lesions (SUV max 4.6 in the sacrum). Right knee FDG activity associated with cruciate ligaments and synovium is likely degenerative or posttraumatic (SUV max 5), with small knee effusion (SUV max 3.3).IMPRESSION:1.  The accession number [id number] does not have any associated images in PACS at the time of dictation.  PET-Botkins images associated with the accession number [id number] / [id number] were interpreted in this report.2.  Hypermetabolic mass overlying the right posterior 10th rib is consistent with malignancy, and involves the skin, subcutaneous fat, extending to the latissimus dorsi muscle.3.  Right axillary nodal metastases.4.  Multiple small right subpectoral lymph nodes are indeterminate; metastases are not excluded.5.  Base of tongue and preepiglottic FDG activity is nonspecific, with slight asymmetric fullness on the left. Direct visualization is recommended to exclude primary neoplasm.6.  Minimally FDG avid bilateral level 2 cervical lymph nodes are nonspecific, possibly inflammatory.MRI brain 01/30/19:There is no intracranial hemorrhage or major vascular distribution infarct.No abnormal enhancement is seen.There is no mass effect, edema, or midline shift.The ventricles are symmetric and normal in size. Several scattered foci of T2/FLAIR hyperintense signal are noted in the periventricular and subcortical white matter, likely sequela of chronic small vessel ischemic disease. No extra-axial collection is seen.Prior lens replacements noted bilaterally.The paranasal sinuses are clear. Small right mastoid effusion noted.IMPRESSION:No evidence of intracranial metastatic disease.WLE and SLN 03/03/19:LYMPH NODES,  RIGHT AXILLARY LEVELS 1, 2 AND 3, LYMPH NODE DISSECTION: ?  ? ? - ?THREE OF THIRTY-FIVE LYMPH NODES, POSITIVE FOR MELANOMA (3 OF 35) WITH LARGEST INVOLVED LYMPH NODE MEASURING 4.5 X 3 CM ? ? ?- LYMPHATIC INVASION IDENTIFIED ? ? ?- NO EXTRANODAL EXTENSION IDENTIFIED 1. ?SKIN, BACK, RIGHT UPPER, EXCISION: ? ? ? ?- MALIGNANT MELANOMA, SEE NOTE AND SYNOPTIC SUMMARY Note: Sections show a tumor composed of S100 positive epithelioid cells within the dermis and subcutaneous tissue. Cytokeratin AE1/AE3 and desmin are negative. A junctional component is not identified. The findings would be compatible with a primary dermal melanoma or a metastatic lesion. Clinical correlation is suggested. 2. ?SKIN, BACK, EXCISION: ? ? ? ?- BENIGN FIBROADIPOSE TISSUE AND SKELETAL MUSCLE SYNOPTIC SUMMARY MELANOMA OF THE SKIN Tumor Site: ? ? Back Laterality: ? ? Right Procedure: ? ? Primary excision Maximum Tumor Thickness (Depth): ? ? 30 mm Ulceration: ? ? Not identified Mitotic Rate (Mitoses/mm2): ? ? 2 Anatomic Level: ? ? V (Melanoma invades subcutis) Growth Phase: ? ? Vertical Histologic Type: ? ? Melanoma, NOS Tumor-Infiltrating Lymphocytes: ? ? Present, non-brisk Tumor Regression: ? ? Not identifed Lymphovascular Invasion: ? ? Present Microsatellitosis: ? ? Present Margins ? ? ?Peripheral Margins: ? ? Uninvolved Deep Margin: ? ? Uninvolved Stage (AJCC 8th Ed): ? ? pT4a Nx Base of the tongue and vallecula random biopsies 02/20/19: 1. ?TONGUE, LEFT, BASE, BIOPSY: ? ? ? ?- LYMPHOID TISSUE HYPERPLASIA ? ? ?- NEGATIVE FOR MALIGNANCY 2. ?THROAT, VALLECULA, BIOPSY: ? ?  ? - LYMPHOID TISSUE HYPERPLASIA AND FOCAL CHRONIC INFLAMMATION. SEE NOTE. ? ? ?- NEGATIVE FOR MALIGNANCY ? ? ? ? ? Note: Immunostain of Sox-10 to investigate focal pigment is negative, supporting above interpretation. 3. ?THROAT, LEFT VALLECULA, BIOPSY: ? ? ? ?- LYMPHOID TISSUE HYPERPLASIA ? ? ?- NEGATIVE FOR MALIGNANCY 4. ?THROAT, RIGHT VALLECULA, BIOPSY: ? ? ? ? ? ? - LYMPHOID TISSUE HYPERPLASIA AND FOCAL CHRONIC INFLAMMATION ? ? ?- NEGATIVE FOR MALIGNANCY 5. ?TONGUE, RIGHT BASE, BIOPSY: ? ? ? ? ? ? - LYMPHOID TISSUE HYPERPLASIA AND FOCAL CHRONIC INFLAMMATION ? ? ?- NEGATIVE FOR MALIGNANCY Pathology (reviewed at [facility]), collected 01/07/19:DIAGNOSIS: ? 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 Clinical pathological correlation is recommended. MICROSCOPIC DESCRIPTION: There are atypical cells in a nodule in the dermis. The cells are positive with SOX-10 and by report are negative with LCA, Kappa, Lambda, and a cytokeratin. The cells are also positive with S-100 by report.ASSESSMENT/PLAN:[name] is a 74 y.o. 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  Initially path read as metastatic melanoma, but further workup confirms that this is the primary site, not a metastatic lesion.  Initial biopsy was likely altered due to repeat I&Ds of the area.  He had a FBSE with his dermatologist and no additional primary lesions were identified.  He is otherwise in good health except for metabolic syndrome and diabetes-associated neuropathy.  PET 02/07/19 showed the main lesion on the right upper back extending to the latissimus dorsi muscle with involved right axillary nodes and nonspecific avidity in the BOT and preepiglottis for which direct visual evaluation was recommended and done 02/11/19 and random biopsies 02/20/19 without concerns for malignancy. Colonoscopy was also done to evaluate stranding in the transverse colon, which was negative for malignancy on exam.  Now s/p WLE  and LND with Dr. Duanne Moron on 03/03/19 showing a stage IIIC melanoma that was 30 mm thick, positive for LVI and microsatellites with 3/25 positive LNs.  Given the thickness of his primary melanoma and microsatellitosis, we felt that his melanoma-specific survival is closer to a stage IIID:  60% over 10 years with stage IIIC disease versus 24% with stage IIID.  He does not have a BRAF mutation, so SOC options are immune therapy at this point versus the Moderna clinical trial (?A Phase 2 Randomized Study of Adjuvant Immunotherapy with the Personalized Cancer Vaccine [phone] and Pembrolizumab Versus Pembrolizumab Alone After Complete Resection of High-Risk Melanoma?) vs active surveillance (for which he would be extremely high risk for relapsing).  He opted for the Moderna trial and signed consent on 03/27/19; tissue passed QC inspection.  Jason Rowland was randomized to the [facility] with vaccine arm of the trial.  He has experienced some weight loss due to loss of taste/smell/appetite after initiation the clinical trial, which is now resolved with weight gain. He has stable right arm lymphedema but declines any lymphedema therapy or sleeves.  He also had some G2 diarrhea after C4, which resolved with Imodium and not requiring anti-diarrheals by C7.  Had 1 episode of diarrhea quickly resolved after last tx with 1 dose of Imodium; this does not seem related to therapy (he intermittently has occasional episodes of diarrhea at baseline related to certain foods).- labs reviewed today.  LDH wnl. LFTs wnl.  Bilis are normal.  TSH wnl.  Ok for treatment C8 today. Will get pembrolizumab with vaccine.- [place] CAP 4/12 NED; surveillance scans every 12 weeks.  Will try to reschedule scans for 7/7 in [place] after he gets back from [facility]- routine f/u with dermatologist every 4-6 months.  Last seen 04/2019; he reports having an appt coming up in July, but will have to move it b/c of his [place] trip.- RTC in 3 weeks per trial schedule; instructed to call if any issues or questions in the interim30 min were spent in direct patient contact.  He has our clinic contact information and instructed to call if questions.  Drema Halon, MD, PhDMedical Oncology

## 2019-10-02 NOTE — Progress Notes
HIC #?[id number]?Cycle?8, Day 1?Patient seen and examined by?Dr. Romona Curls and toxicity assessment reviewed?by?Dr. Lucianne Muss labs are deemed NCS today, Concomitant medications reviewed and updated.Approved for treatment?for pembro and vaccine dose?today per??Dr. Laveda Norman.??ECOG PS =?1??Concomitant medications:?MEDICATION SINGLE DOSE UNITS ROUTE FREQUECY START DATE STOP DATE INDICATION Aspirin 81 mg PO QD 2010 ongoing PPX CVD Simvastatin 20 Mg PO QD 2012 ongoing Hypercholesterolemia ?Ambien 10 mg PO QD 2016 ongoing insomnia ?Glucoside 10 mg PO QD 2014 ongoing diabetes ?Toujeo Solostar ?80 Units SQ QD ?2016 ?07/08/19 diabetes ?guaifenesin 400 mg PO q 4 hours prn 07/10/19 08/16/19 cough ?imodium 2 Mg PO PRN 07/19/19 ongoing diarrhea Toujeo Solostar 40-50 units subcutaneous QD 07/09/19 10/01/19 diabetes ?Toujeo Solostar 60 units SQ QD 10/02/19 ?ongoing diabetes ? ? ? ? ? ? ? ? ? ? ? ? ? ? ? ? ??Medical History:?? ? ? ? ? ? ? ? ? ? ? ? ? ? ? ? ? ? ? ? ? ? ? ? ? ? ? ? PATIENT NAME:?Jason Rowland??STUDY ID: ?[id number] ? ? 0=NA ?????????????????????1= Unrelated ????????2= Unlikely 3=Possible ????????4= Probable ??????????5. Definite (Circle One) 0= No Action Taken ????????????????????????????????????????1= Con Med ???????????????????????????????????????????????????????????2=Dose Modified ???????????????????????????????????????3=Dose Delayed ????????????????????????????????????????????????????????4= Patient Hospitalized ?????????????????????????????????????5= Patient Taken Off Study ???????????????????6=Non Drug Therapy ???????????????????????????????????????7=Other (Specify) 1=Recovered ??????????????????????2=Still under txmt/observation ?????????????????????????????3=Alive w/Sequelae ????????????????????????????????????????????????????????????????4=Died ????????????????????????5=Resolvd to Baseline ????????????6=Grade Change ? ? ? Medical History ? ? ? ? ? ? ? ? ? ? ? ADVERSE EVENT  Is AE Intermittent? Y/N SAE ????????????Y/N Grade Expect?PEMBRO?Y/N ?Relation to?PEMBRO Expect?mRNA????????????Y/N ?Relation to?mRNA Start Date End Date/ or ?Ongoing Action Taken ??????????????(Select # above) AE OUTCOME ? Hypercholesterolemia N N 1 Med Hx Med Hx Med Hx Med Hx 2012 ongoing 1=?simvastatin 2 ? diabetes N N 1 Med Hx Med Hx Med Hx Med Hx 2010 ongoing 1-?glucoside; toujeo 2 ? Insomnia N N 1 Med Hx Med Hx Med Hx Med Hx 2016 ongoing 1- ambien 2 ? Edema?limbs N N 1 Med Hx? Med Hx Med Hx Med Hx ?03/03/2019 ongoing 0 2 ? Diarrhea? N N 1 Y? 1 ?N/A ?N/A ?05/27/2019 05/27/2019 0 5 ? fatigue? ?N N? 1? ?Y 3? ?Y 3? ?06/26/19 ?ongoing ?0 2? ? dysgeusia N N 1 N 2 N 2 06/22/19 08/21/19 0 2 ? anorexia N N 1 Y 2 Y 2 06/22/19 08/21/19 0 2 ? Pain, injection site Y N 1 N 1 Y 4 06/19/19 06/23/19 0 2 ? Eye disorders, other (left eye droop) N N 1 N 2 N 2 07/01/19 07/31/19 0 2 ? vomiting N N 1 Y 3 Y 3 07/08/19 07/08/19 0 1 ? Jason Rowland 1 Y 3 Y 3 07/08/19 07/08/19 0 2 ? cough N N 1 Y 3 Y 3 07/08/19 08/16/19 1 = gueifenisen 2 ? ALT increased N N 1 Y 3 Y 3 07/10/19 07/31/19 0 2 ? AST increased  N N 1 Y 3 Y 3 07/10/19 07/31/19 0 2 ? Alkaline phos increased N N 2 Y 3 Y 3 07/10/19 07/31/19 0 2 ? Diarrhea? Y N 2 Y 3 Y 3 07/09/19 07/20/2019 1- imodium 1 ? Injection site pain Y N 1 N 1 Y 3 07/08/19 ongoing 0 2 ? bloating Y N 1 N 1 N 1 07/08/19 ongoing 0 2 ? Bilateral foot neuropathy N N 1 N/A N/A N/A N/A Baseline 02/2019 ongoing 0 2 ? Dyspnea on exertion Y N 1 N/A N/A N/A N/A Baseline 02/2019 ongoing 0 2 ? Diarrhea? N N 1 Y 1 Y 1 09/25/19? 09/25/19 1- imodium 1 ? Anorexia? N  N 1 Y 2 Y 2 10/02/19  ongoing 0 2 ?              ? ?Labs:Results for Jason Rowland, Jason Rowland (MRN [id number]) as of 10/02/2019 13:14 Ref. Range 10/02/2019 10:53 Sodium Latest Ref Range: 136 - 144 mmol/L 138 Potassium Latest Ref Range: 3.3 - 5.1 mmol/L 4.3 Chloride Latest Ref Range: 98 - 107 mmol/L 100 CO2 Latest Ref Range: 20 - 30 mmol/L 26 Anion Gap Latest Ref Range: 7 - 17  12 BUN Latest Ref Range: 8 - 23 mg/dL 18 Creatinine Latest Ref Range: 0.40 - 1.30 mg/dL 8.29 BUN/Creatinine Ratio Latest Ref Range: 8.0 - 23.0  23.1 (H) eGFR (NON African-American) Latest Ref Range: >60 mL/min/1.4m2 >60 eGFR (Afr Amer) Latest Ref Range: >60 mL/min/1.62m2 >60 Glucose Latest Ref Range: 70 - 100 mg/dL 562 (H) Calcium Latest Ref Range: 8.8 - 10.2 mg/dL 9.6 Magnesium Latest Ref Range: 1.7 - 2.4 mg/dL 1.6 (L) Phosphorus Latest Ref Range: 2.2 - 4.5 mg/dL 3.2 Total Bilirubin Latest Ref Range: <=1.2 mg/dL 0.3 Alkaline Phosphatase Latest Ref Range: 9 - 122 U/L 85 Alanine Aminotransferase (ALT) Latest Ref Range: 9 - 59 U/L 22 Aspartate Aminotransferase (AST) Latest Ref Range: 10 - 35 U/L 20 AST/ALT Ratio Latest Ref Range: See Comment  0.9 LD Latest Ref Range: 122 - 241 U/L 146 Total Protein Latest Ref Range: 6.6 - 8.7 g/dL 7.9 Albumin Latest Ref Range: 3.6 - 4.9 g/dL 4.1 Globulin Latest Ref Range: 2.3 - 3.5 g/dL 3.8 (H) A/G Ratio Latest Ref Range: 1.0 - 2.2  1.1 Total CK Latest Ref Range: 11 - 204 U/L 66 T3, Total Latest Ref Range: See Comment ng/dL 13.0 Thyroid Stimulating Hormone, 3rd Gen. Latest Ref Range: See Comment ?IU/mL 3.490 Free T4 Latest Ref Range: See Comment ng/dL 8.65 WBC Latest Ref Range: 4.0 - 10.0 x1000/?L 8.5 RBC Latest Ref Range: 3.8 - 5.9 M/?L 4.9 Hemoglobin Latest Ref Range: 12.0 - 18.0 g/dL 78.4 Hematocrit Latest Ref Range: 37.0 - 52.0 % 42.1 MCV Latest Ref Range: 78.0 - 94.0 fL 86.4 MCH Latest Ref Range: 27.0 - 31.0 pg 27.7 MCHC Latest Ref Range: 31.0 - 36.0 g/dL 69.6 RDW-CV Latest Ref Range: 11.5 - 14.5 % 15.3 (H) nRBC Latest Ref Range: 0.0 - 1.0 % 0.0 Platelets Latest Ref Range: 140 - 440 x1000/?L 299 MPV Latest Ref Range: 6.0 - 11.0 fL 10.8 Neutrophils Latest Ref Range: 37.0 - 84.0 % 68.2 Lymphocytes Latest Ref Range: 8.0 - 49.0 % 21.3 Monocytes Latest Ref Range: 4.0 - 15.0 % 6.6 Eosinophils Latest Ref Range: 0.0 - 7.0 % 2.6 Basophils Latest Ref Range: 0.0 - 4.0 % 0.8 ANC (Abs Neutrophil Count) Latest Ref Range: 1.0 - 11.0 x 1000/?L 5.8 Absolute Lymphocyte Count Latest Ref Range: 1.0 - 4.0 x 1000/?L 1.8 Monocytes (Absolute) Latest Ref Range: 0.0 - 2.0 x 1000/?L 0.6 Eosinophil Absolute Count Latest Ref Range: 0.0 - 1.0 x 1000/?L 0.2 Basophils Absolute Latest Ref Range: 0.0 - 0.0 x 1000/?L 0.1 (H) Immature Granulocytes (Abs) Latest Ref Range: 0.0 - 0.3 x 1000/?L 0.0 nRBC Absolute Latest Ref Range: 0.0 - 0.0 x 1000/?L 0.0 Immature Granulocytes Latest Ref Range: 0.0 - 3.0 % 0.5 Prothrombin Time Latest Ref Range: 9.9 - 11.5 seconds 10.4 INR Latest Ref Range: 0.92 - 1.08  0.97 PTT Latest Ref Range: 22.2 - 29.6 seconds 24.3

## 2019-10-06 ENCOUNTER — Encounter: Admit: 2019-10-06 | Payer: PRIVATE HEALTH INSURANCE

## 2019-10-07 ENCOUNTER — Telehealth: Admit: 2019-10-07 | Payer: PRIVATE HEALTH INSURANCE

## 2019-10-07 ENCOUNTER — Encounter: Admit: 2019-10-07 | Payer: PRIVATE HEALTH INSURANCE

## 2019-10-07 NOTE — Telephone Encounter
Talked with Jason Rowland.  We will move his scans to the following Monday-Wednesday.  He is ok with waiting for results until his next treatment appointment after that rather than having an interim appointment.  He will be going to [place] this week and returning before his next appointment so he does not think he would be back on time for the scans.  He is aware I am off the next few days and I will call him on Monday with the new scan date/times.  [name] CRA.

## 2019-10-14 ENCOUNTER — Telehealth: Admit: 2019-10-14 | Payer: PRIVATE HEALTH INSURANCE

## 2019-10-14 NOTE — Telephone Encounter
Left message with scan date time for July 12 in [place].  [address]  Arrive 4:15 for 4:30 PM scan.  Can call with any questions [phone].  [name] CRA

## 2019-10-16 ENCOUNTER — Ambulatory Visit: Admit: 2019-10-16 | Payer: PRIVATE HEALTH INSURANCE

## 2019-10-21 ENCOUNTER — Ambulatory Visit: Admit: 2019-10-21 | Payer: PRIVATE HEALTH INSURANCE

## 2019-10-22 ENCOUNTER — Ambulatory Visit: Admit: 2019-10-22 | Payer: PRIVATE HEALTH INSURANCE

## 2019-10-23 ENCOUNTER — Inpatient Hospital Stay: Admit: 2019-10-23 | Discharge: 2019-10-23 | Payer: BLUE CROSS/BLUE SHIELD

## 2019-10-23 ENCOUNTER — Ambulatory Visit: Admit: 2019-10-23 | Payer: BLUE CROSS/BLUE SHIELD

## 2019-10-23 ENCOUNTER — Encounter: Admit: 2019-10-23 | Payer: PRIVATE HEALTH INSURANCE | Attending: Medical Oncology

## 2019-10-23 ENCOUNTER — Encounter: Admit: 2019-10-23 | Payer: PRIVATE HEALTH INSURANCE

## 2019-10-23 DIAGNOSIS — Z955 Presence of coronary angioplasty implant and graft: Secondary | ICD-10-CM

## 2019-10-23 DIAGNOSIS — E7800 Pure hypercholesterolemia, unspecified: Secondary | ICD-10-CM

## 2019-10-23 DIAGNOSIS — Z791 Long term (current) use of non-steroidal anti-inflammatories (NSAID): Secondary | ICD-10-CM

## 2019-10-23 DIAGNOSIS — C4359 Malignant melanoma of other part of trunk: Secondary | ICD-10-CM

## 2019-10-23 DIAGNOSIS — Z23 Encounter for immunization: Secondary | ICD-10-CM

## 2019-10-23 DIAGNOSIS — Z8052 Family history of malignant neoplasm of bladder: Secondary | ICD-10-CM

## 2019-10-23 DIAGNOSIS — Z006 Encounter for examination for normal comparison and control in clinical research program: Secondary | ICD-10-CM

## 2019-10-23 DIAGNOSIS — Z96643 Presence of artificial hip joint, bilateral: Secondary | ICD-10-CM

## 2019-10-23 DIAGNOSIS — Z7982 Long term (current) use of aspirin: Secondary | ICD-10-CM

## 2019-10-23 DIAGNOSIS — Z8051 Family history of malignant neoplasm of kidney: Secondary | ICD-10-CM

## 2019-10-23 DIAGNOSIS — Z79899 Other long term (current) drug therapy: Secondary | ICD-10-CM

## 2019-10-23 DIAGNOSIS — E119 Type 2 diabetes mellitus without complications: Secondary | ICD-10-CM

## 2019-10-23 DIAGNOSIS — E1142 Type 2 diabetes mellitus with diabetic polyneuropathy: Secondary | ICD-10-CM

## 2019-10-23 DIAGNOSIS — Z806 Family history of leukemia: Secondary | ICD-10-CM

## 2019-10-23 DIAGNOSIS — Z8249 Family history of ischemic heart disease and other diseases of the circulatory system: Secondary | ICD-10-CM

## 2019-10-23 DIAGNOSIS — Z794 Long term (current) use of insulin: Secondary | ICD-10-CM

## 2019-10-23 DIAGNOSIS — E785 Hyperlipidemia, unspecified: Secondary | ICD-10-CM

## 2019-10-23 DIAGNOSIS — Z5112 Encounter for antineoplastic immunotherapy: Secondary | ICD-10-CM

## 2019-10-23 LAB — URINALYSIS-MACROSCOPIC W/REFLEX MICROSCOPIC
BKR BILIRUBIN, UA: NEGATIVE
BKR BLOOD, UA: NEGATIVE
BKR GLUCOSE, UA: NEGATIVE
BKR KETONES, UA: NEGATIVE
BKR LEUKOCYTE ESTERASE, UA: NEGATIVE mg/dL (ref 0.40–1.30)
BKR NITRITE, UA: NEGATIVE
BKR PH, UA: 6 mmol/L (ref 5.5–7.5)
BKR SPECIFIC GRAVITY, UA: 1.016 (ref 1.005–1.020)
BKR UROBILINOGEN, UA: 0.2 EU/dL (ref <=2.0)

## 2019-10-23 LAB — CBC WITH AUTO DIFFERENTIAL
BKR BILIRUBIN TOTAL: 69.9 % (ref 37.0–84.0)
BKR WAM ABSOLUTE IMMATURE GRANULOCYTES: 0 x 1000/??L (ref 0.0–0.3)
BKR WAM ABSOLUTE LYMPHOCYTE COUNT: 1.7 x 1000/??L (ref 1.0–4.0)
BKR WAM ABSOLUTE NRBC: 0 x 1000/??L (ref 0.0–0.0)
BKR WAM ANALYZER ANC: 6.1 x 1000/??L (ref 1.0–11.0)
BKR WAM BASOPHIL ABSOLUTE COUNT: 0.1 x 1000/??L — ABNORMAL HIGH (ref 0.0–0.0)
BKR WAM BASOPHILS: 0.8 % (ref 0.0–4.0)
BKR WAM EOSINOPHIL ABSOLUTE COUNT: 0.2 x 1000/ÂµL (ref 0.0–1.0)
BKR WAM EOSINOPHILS: 2 % (ref 0.0–7.0)
BKR WAM HEMATOCRIT: 43.2 % (ref 37.0–52.0)
BKR WAM HEMOGLOBIN: 14.2 g/dL (ref 12.0–18.0)
BKR WAM IMMATURE GRANULOCYTES: 0.2 % (ref 0.0–3.0)
BKR WAM LYMPHOCYTES: 19.7 % (ref 8.0–49.0)
BKR WAM MCH (PG): 28.5 pg (ref 27.0–31.0)
BKR WAM MCHC: 32.9 g/dL (ref 31.0–36.0)
BKR WAM MCV: 86.7 fL (ref 78.0–94.0)
BKR WAM MONOCYTE ABSOLUTE COUNT: 0.6 x 1000/??L — ABNORMAL HIGH (ref 0.0–2.0)
BKR WAM MONOCYTES: 7.4 % (ref 4.0–15.0)
BKR WAM MPV: 11 fL (ref 6.0–11.0)
BKR WAM NEUTROPHILS: 69.9 % (ref 37.0–84.0)
BKR WAM NUCLEATED RED BLOOD CELLS: 0 % (ref 0.0–1.0)
BKR WAM PLATELETS: 271 x1000/ÂµL (ref 140–440)
BKR WAM RDW-CV: 15.3 % — ABNORMAL HIGH (ref 11.5–14.5)
BKR WAM RED BLOOD CELL COUNT: 5 M/??L (ref 3.8–5.9)
BKR WAM WHITE BLOOD CELL COUNT: 8.7 x1000/??L — AB (ref 4.0–10.0)

## 2019-10-23 LAB — COMPREHENSIVE METABOLIC PANEL
BKR A/G RATIO: 1.2 % (ref 1.0–2.2)
BKR ALANINE AMINOTRANSFERASE (ALT): 19 U/L (ref 9–59)
BKR ALBUMIN: 4.2 g/dL (ref 3.6–4.9)
BKR ALKALINE PHOSPHATASE: 77 U/L (ref 9–122)
BKR ANION GAP: 12 (ref 7–17)
BKR ASPARTATE AMINOTRANSFERASE (AST): 21 U/L (ref 10–35)
BKR AST/ALT RATIO: 1.1 x 1000/??L (ref 0.0–1.0)
BKR BLOOD UREA NITROGEN: 18 mg/dL (ref 8–23)
BKR BUN / CREAT RATIO: 21.2 x1000/??L (ref 8.0–23.0)
BKR CALCIUM: 9.5 mg/dL — ABNORMAL HIGH (ref 8.8–10.2)
BKR CHLORIDE: 100 mmol/L (ref 98–107)
BKR CO2: 28 mmol/L (ref 20–30)
BKR CREATININE: 0.85 mg/dL (ref 0.40–1.30)
BKR EGFR (AFR AMER): >60 mL/min/1.73m2 (ref >60)
BKR EGFR (NON AFRICAN AMERICAN): >60 mL/min/{1.73_m2} (ref >60)
BKR GLOBULIN: 3.6 g/dL — ABNORMAL HIGH (ref 2.3–3.5)
BKR GLUCOSE: 133 mg/dL — ABNORMAL HIGH (ref 70–100)
BKR POTASSIUM: 4.5 mmol/L (ref 3.3–5.1)
BKR PROTEIN TOTAL: 7.8 g/dL (ref 6.6–8.7)
BKR SODIUM: 140 mmol/L (ref 136–144)

## 2019-10-23 LAB — PT/INR AND PTT (BH GH L LMW YH)
BKR INR: 1.06 (ref 0.92–1.08)
BKR PARTIAL THROMBOPLASTIN TIME: 24.9 seconds (ref 22.2–29.6)
BKR PROTHROMBIN TIME: 11.3 seconds (ref 9.9–11.5)

## 2019-10-23 LAB — CK     (BH GH L LMW YH): BKR CREATINE KINASE TOTAL: 108 U/L (ref 11–204)

## 2019-10-23 LAB — T3: BKR T3 TOTAL: 88.1 ng/dL

## 2019-10-23 LAB — T4, FREE: BKR FREE T4: 0.98 ng/dL

## 2019-10-23 LAB — URINE MICROSCOPIC     (BH GH LMW YH)

## 2019-10-23 LAB — PHOSPHORUS     (BH GH L LMW YH): BKR PHOSPHORUS: 3.5 mg/dL (ref 2.2–4.5)

## 2019-10-23 LAB — MAGNESIUM: BKR MAGNESIUM: 1.8 mg/dL (ref 1.7–2.4)

## 2019-10-23 LAB — LACTATE DEHYDROGENASE: BKR LACTATE DEHYDROGENASE: 163 U/L (ref 122–241)

## 2019-10-23 LAB — TSH: BKR THYROID STIMULATING HORMONE: 2.4 ??IU/mL

## 2019-10-23 MED ORDER — EPINEPHRINE 0.3 MG/0.3 ML INJECTION, AUTO-INJECTOR
0.3 mg/ mL | INTRAMUSCULAR | Status: DC | PRN
Start: 2019-10-23 — End: 2019-10-28

## 2019-10-23 MED ORDER — HIC 2000025461 PEMBROLIZUMAB (MK-3475)
Freq: Once | INTRAVENOUS | Status: CP
Start: 2019-10-23 — End: ?
  Administered 2019-10-23: 18:00:00 100.000 mL/h via INTRAVENOUS

## 2019-10-23 MED ORDER — MEPERIDINE 25 MG/2.5 ML IN 0.9% SODIUM CHLORIDE
Freq: Once | INTRAVENOUS | Status: DC | PRN
Start: 2019-10-23 — End: 2019-10-28

## 2019-10-23 MED ORDER — DIPHENHYDRAMINE 50 MG/ML INJECTION SOLUTION
50 mg/mL | Freq: Once | INTRAVENOUS | Status: DC | PRN
Start: 2019-10-23 — End: 2019-10-28

## 2019-10-23 MED ORDER — FAMOTIDINE 4 MG/ML IN STERILE WATER (ADULT)
Freq: Once | INTRAVENOUS | Status: DC | PRN
Start: 2019-10-23 — End: 2019-10-28

## 2019-10-23 MED ORDER — HIC 2000025461 (MRNA-4157)
Freq: Once | INTRAMUSCULAR | Status: CP
Start: 2019-10-23 — End: ?
  Administered 2019-10-23: 18:00:00 1.000 mL via INTRAMUSCULAR

## 2019-10-23 MED ORDER — SODIUM CHLORIDE 0.9 % BOLUS (NEW BAG)
0.9 % | Freq: Once | INTRAVENOUS | Status: DC | PRN
Start: 2019-10-23 — End: 2019-10-28

## 2019-10-23 MED ORDER — HYDROCORTISONE SODIUM SUCCINATE 100 MG SOLUTION FOR INJECTION
100 mg | Freq: Once | INTRAVENOUS | Status: DC | PRN
Start: 2019-10-23 — End: 2019-10-28

## 2019-10-23 NOTE — Progress Notes
Day 1 Cycle 9Pt. ambulated to department alert and oriented x 4 in no obvious distress.No new complain voiced.PIV access gained to left ac with 24 G cannula. Bloods drawn and sent to lab.Pt. seen by provider, lab results reviewed and patient cleared for treatment.HIC [id number] ([id number]) investigational study drug 1 mg ( 0.5 mg/mL x 2 injections) given in left deltoid muscle as ordered.No adverse reaction noted. No redness or swelling noted to arm.HIC [id number] pembrolizumab ([id number]) 200 mg in 100 mL of 0.9% sodium chloride infused over 30 minutes.No adverse reaction noted.IV flushed. Positive blood return noted.PIV access removed. Gauze and coban applied.Pt. Refused AVS. Will use my chart.Pt. left unit in stable condition.Follow-up appointment 11/13/2019.

## 2019-10-23 NOTE — Progress Notes
HIC #?[id number]?Cycle?9, Day 1?Patient seen and examined by?Dr. Romona Curls and toxicity assessment reviewed?by?Dr. Lucianne Muss labs are deemed NCS today, Concomitant medications reviewed and updated.Approved for treatment?for pembro and vaccine dose?today per??Dr. Laveda Norman.??ECOG PS =?1??Concomitant medications:?MEDICATION SINGLE DOSE UNITS ROUTE FREQUECY START DATE STOP DATE INDICATION Aspirin 81 mg PO QD 2010 ongoing PPX CVD Simvastatin 20 Mg PO QD 2012 ongoing Hypercholesterolemia ?Ambien 10 mg PO QD 2016 ongoing insomnia ?Glucoside 10 mg PO QD 2014 ongoing diabetes ?Toujeo Solostar ?80 Units SQ QD ?2016 ?07/08/19 diabetes ?guaifenesin 400 mg PO q 4 hours prn 07/10/19 08/16/19 cough ?imodium 2 Mg PO PRN 07/19/19 ongoing diarrhea Toujeo Solostar 40-50 units subcutaneous QD 07/09/19 10/01/19 diabetes ?Toujeo Solostar 60 units SQ QD 10/02/19 ?ongoing diabetes ? ? ? ? ? ? ? ? ? ? ? ? ? ? ? ? ??Medical History:?? ? ? ? ? ? ? ? ? ? ? ? ? ? ? ? ? ? ? ? ? ? ? ? ? ? ? ? PATIENT NAME:?Jason Rowland??STUDY ID: ?[id number] ? ? 0=NA ?????????????????????1= Unrelated ????????2= Unlikely 3=Possible ????????4= Probable ??????????5. Definite (Circle One) 0= No Action Taken ????????????????????????????????????????1= Con Med ???????????????????????????????????????????????????????????2=Dose Modified ???????????????????????????????????????3=Dose Delayed ????????????????????????????????????????????????????????4= Patient Hospitalized ?????????????????????????????????????5= Patient Taken Off Study ???????????????????6=Non Drug Therapy ???????????????????????????????????????7=Other (Specify) 1=Recovered ??????????????????????2=Still under txmt/observation ?????????????????????????????3=Alive w/Sequelae ????????????????????????????????????????????????????????????????4=Died ????????????????????????5=Resolvd to Baseline ????????????6=Grade Change ? ? ? Medical History ? ? ? ? ? ? ? ? ? ? ? ADVERSE EVENT  Is AE Intermittent? Y/N SAE ????????????Y/N Grade Expect?PEMBRO?Y/N ?Relation to?PEMBRO Expect?mRNA????????????Y/N ?Relation to?mRNA Start Date End Date/ or ?Ongoing Action Taken ??????????????(Select # above) AE OUTCOME ? Hypercholesterolemia N N 1 Med Hx Med Hx Med Hx Med Hx 2012 ongoing 1=?simvastatin 2 ? diabetes N N 1 Med Hx Med Hx Med Hx Med Hx 2010 ongoing 1-?glucoside; toujeo 2 ? Insomnia N N 1 Med Hx Med Hx Med Hx Med Hx 2016 ongoing 1- ambien 2 ? Edema?limbs N N 1 Med Hx? Med Hx Med Hx Med Hx ?03/03/2019 ongoing 0 2 ? Diarrhea? N N 1 Y? 1 ?N/A ?N/A ?05/27/2019 05/27/2019 0 5 ? fatigue? ?N N? 1? ?Y 3? ?Y 3? ?06/26/19 ?ongoing ?0 2? ? dysgeusia N N 1 N 2 N 2 06/22/19 08/21/19 0 2 ? anorexia N N 1 Y 2 Y 2 06/22/19 08/21/19 0 2 ? Pain, injection site Y N 1 N 1 Y 4 06/19/19 06/23/19 0 2 ? Eye disorders, other (left eye droop) N N 1 N 2 N 2 07/01/19 07/31/19 0 2 ? vomiting N N 1 Y 3 Y 3 07/08/19 07/08/19 0 1 ? Cathlyn Parsons 1 Y 3 Y 3 07/08/19 07/08/19 0 2 ? cough N N 1 Y 3 Y 3 07/08/19 08/16/19 1 = gueifenisen 2 ? ALT increased N N 1 Y 3 Y 3 07/10/19 07/31/19 0 2 ? AST increased  N N 1 Y 3 Y 3 07/10/19 07/31/19 0 2 ? Alkaline phos increased N N 2 Y 3 Y 3 07/10/19 07/31/19 0 2 ? Diarrhea? Y N 2 Y 3 Y 3 07/09/19 07/20/2019 1- imodium 1 ? Injection site pain Y N 1 N 1 Y 3 07/08/19 ongoing 0 2 ? bloating Y N 1 N 1 N 1 07/08/19 ongoing 0 2 ? Bilateral foot neuropathy N N 1 N/A N/A N/A N/A Baseline 02/2019 ongoing 0 2 ? Dyspnea on exertion Y N 1 N/A N/A N/A N/A Baseline 02/2019 ongoing 0 2 ? Diarrhea? N N 1 Y 1 Y 1 09/25/19? 09/25/19 1- imodium 1 ? Anorexia? N  N 1 Y 2 Y 2 10/02/19  ongoing 0 2 ? Diarrhea? N N 1 Y 1 Y 1 10/16/19? 10/17/19 0 1 ?                                                                               ? ?Labs:Results for Jason Rowland, Jason Rowland (MRN [id number])  Ref. Range 10/23/2019 11:08 Sodium Latest Ref Range: 136 - 144 mmol/L 140 Potassium Latest Ref Range: 3.3 - 5.1 mmol/L 4.5 Chloride Latest Ref Range: 98 - 107 mmol/L 100 CO2 Latest Ref Range: 20 - 30 mmol/L 28 Anion Gap Latest Ref Range: 7 - 17  12 BUN Latest Ref Range: 8 - 23 mg/dL 18 Creatinine Latest Ref Range: 0.40 - 1.30 mg/dL 4.09 BUN/Creatinine Ratio Latest Ref Range: 8.0 - 23.0  21.2 eGFR (NON African-American) Latest Ref Range: >60 mL/min/1.13m2 >60 eGFR (Afr Amer) Latest Ref Range: >60 mL/min/1.16m2 >60 Glucose Latest Ref Range: 70 - 100 mg/dL 811 (H) Calcium Latest Ref Range: 8.8 - 10.2 mg/dL 9.5 Magnesium Latest Ref Range: 1.7 - 2.4 mg/dL 1.8 Phosphorus Latest Ref Range: 2.2 - 4.5 mg/dL 3.5 Total Bilirubin Latest Ref Range: <=1.2 mg/dL 0.4 Alkaline Phosphatase Latest Ref Range: 9 - 122 U/L 77 Alanine Aminotransferase (ALT) Latest Ref Range: 9 - 59 U/L 19 Aspartate Aminotransferase (AST) Latest Ref Range: 10 - 35 U/L 21 AST/ALT Ratio Latest Ref Range: See Comment  1.1 LD Latest Ref Range: 122 - 241 U/L 163 Total Protein Latest Ref Range: 6.6 - 8.7 g/dL 7.8 Albumin Latest Ref Range: 3.6 - 4.9 g/dL 4.2 Globulin Latest Ref Range: 2.3 - 3.5 g/dL 3.6 (H) A/G Ratio Latest Ref Range: 1.0 - 2.2  1.2 Total CK Latest Ref Range: 11 - 204 U/L 108 WBC Latest Ref Range: 4.0 - 10.0 x1000/?L 8.7 RBC Latest Ref Range: 3.8 - 5.9 M/?L 5.0 Hemoglobin Latest Ref Range: 12.0 - 18.0 g/dL 91.4 Hematocrit Latest Ref Range: 37.0 - 52.0 % 43.2 MCV Latest Ref Range: 78.0 - 94.0 fL 86.7 MCH Latest Ref Range: 27.0 - 31.0 pg 28.5 MCHC Latest Ref Range: 31.0 - 36.0 g/dL 78.2 RDW-CV Latest Ref Range: 11.5 - 14.5 % 15.3 (H) nRBC Latest Ref Range: 0.0 - 1.0 % 0.0 Platelets Latest Ref Range: 140 - 440 x1000/?L 271 MPV Latest Ref Range: 6.0 - 11.0 fL 11.0 Neutrophils Latest Ref Range: 37.0 - 84.0 % 69.9 Lymphocytes Latest Ref Range: 8.0 - 49.0 % 19.7 Monocytes Latest Ref Range: 4.0 - 15.0 % 7.4 Eosinophils Latest Ref Range: 0.0 - 7.0 % 2.0 Basophils Latest Ref Range: 0.0 - 4.0 % 0.8 ANC (Abs Neutrophil Count) Latest Ref Range: 1.0 - 11.0 x 1000/?L 6.1 Absolute Lymphocyte Count Latest Ref Range: 1.0 - 4.0 x 1000/?L 1.7 Monocytes (Absolute) Latest Ref Range: 0.0 - 2.0 x 1000/?L 0.6 Eosinophil Absolute Count Latest Ref Range: 0.0 - 1.0 x 1000/?L 0.2 Basophils Absolute Latest Ref Range: 0.0 - 0.0 x 1000/?L 0.1 (H) Immature Granulocytes (Abs) Latest Ref Range: 0.0 - 0.3 x 1000/?L 0.0 nRBC Absolute Latest Ref Range: 0.0 - 0.0 x 1000/?L 0.0 Immature Granulocytes Latest Ref Range: 0.0 - 3.0 % 0.2 Prothrombin Time Latest Ref Range: 9.9 - 11.5 seconds 11.3 INR  Latest Ref Range: 0.92 - 1.08  1.06 PTT Latest Ref Range: 22.2 - 29.6 seconds 24.9 Clarity, UA Latest Ref Range: Clear  Clear Specific Gravity, UA Latest Ref Range: 1.005 - 1.020  1.016 pH, UA Latest Ref Range: 5.5 - 7.5  6.0 Protein, UA Latest Ref Range: Negative-Trace  2+ (A) Glucose, UA Latest Ref Range: Negative  Negative Ketones, UA Latest Ref Range: Negative  Negative Blood, UA Latest Ref Range: Negative  Negative Bilirubin, UA Latest Ref Range: Negative  Negative Leukocytes, UA Latest Ref Range: Negative  Negative Nitrite, UA Latest Ref Range: Negative  Negative WBC/HPF Latest Ref Range: 0 - 5 /HPF 0-2 RBC/HPF Latest Ref Range: 0 - 2 /HPF 0-2 Color, UA Latest Ref Range: Yellow  Yellow Urobilinogen, UA Latest Ref Range: <=2.0 EU/dL 0.2 Epithelial Cells Latest Ref Range: None-Few /LPF Few

## 2019-10-23 NOTE — Progress Notes
NAME:  Natanael Saladin ([birth year])AGE: 74 y.o. MRN:  [id number] PROVIDER: Drema Halon, MD, PhDDATE OF SERVICE: 10/23/2019 [facility] [place]  REASON FOR VISIT:  C9D1 of HIC [id number] DX:  Invasive melanoma right mid back, stage IIIC (pT4a N3 M0); 30 mm with microsatellitosis, mitotic rate 2 per mm2.0% tumor and immune cell PD-L1 staining50-gene panel:  DNA VARIANT DETECTED ? ? 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 ? 74% TREATMENT HX:- Adjuvant Moderna clinical trial (?A Phase 2 Randomized Study of Adjuvant Immunotherapy with the Personalized Cancer Vaccine [phone] and Pembrolizumab Versus Pembrolizumab Alone After Complete Resection of High-Risk Melanoma? Protocol [id number], HIC 47829) C1D1 05/08/19.  Randomized to the pembrolizumab and vaccine arm. Onc Hx: Mr. Menning (prefers to be called [name]) is a 74 y.o. male who worked for 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  He reports it appeared approx 10 months prior; his PCP originally thought it was a pimple.  Dr. Lorn Junes at [facility] later evaluated it and also thought it was a pimple and injected it with intralesional kenalog x 2 and oral cephalexin without improvement.  The lesion increased in size and burned whenever he leaned against anything.  Denies night sweats and weight loss.  Lucie Leather did a I&D on 01/07/19  with only slight bloody discharge, so a punch biopsy was obtain with path showing metastatic melanoma.  On 01/20/19, additional biopsies were done to try to evaluate a primary site, but we do not have this tissue.  Per patient report, these biopsies were negative. MRI brain 01/30/19 without intracranial disease.  PET done 02/07/19 showing

## 2019-10-27 ENCOUNTER — Inpatient Hospital Stay: Admit: 2019-10-27 | Discharge: 2019-10-27 | Payer: BLUE CROSS/BLUE SHIELD

## 2019-10-27 DIAGNOSIS — C4359 Malignant melanoma of other part of trunk: Secondary | ICD-10-CM

## 2019-10-27 DIAGNOSIS — C439 Malignant melanoma of skin, unspecified: Secondary | ICD-10-CM

## 2019-10-27 DIAGNOSIS — Z006 Encounter for examination for normal comparison and control in clinical research program: Secondary | ICD-10-CM

## 2019-10-27 MED ORDER — IOHEXOL 350 MG IODINE/ML INTRAVENOUS SOLUTION
350 mg iodine/mL | Freq: Once | INTRAVENOUS | Status: CP | PRN
Start: 2019-10-27 — End: ?
  Administered 2019-10-27: 21:00:00 350 mL via INTRAVENOUS

## 2019-10-27 MED ORDER — SODIUM CHLORIDE 0.9 % BOLUS (NEW BAG)
0.9 % | Freq: Once | INTRAVENOUS | Status: CP | PRN
Start: 2019-10-27 — End: ?
  Administered 2019-10-27: 21:00:00 0.9 mL/h via INTRAVENOUS

## 2019-11-06 ENCOUNTER — Ambulatory Visit: Admit: 2019-11-06 | Payer: PRIVATE HEALTH INSURANCE

## 2019-11-13 ENCOUNTER — Encounter: Admit: 2019-11-13 | Payer: PRIVATE HEALTH INSURANCE

## 2019-11-13 ENCOUNTER — Ambulatory Visit: Admit: 2019-11-13 | Payer: BLUE CROSS/BLUE SHIELD

## 2019-11-13 ENCOUNTER — Inpatient Hospital Stay: Admit: 2019-11-13 | Discharge: 2019-11-13 | Payer: BLUE CROSS/BLUE SHIELD

## 2019-11-13 DIAGNOSIS — Z794 Long term (current) use of insulin: Secondary | ICD-10-CM

## 2019-11-13 DIAGNOSIS — E8881 Metabolic syndrome: Secondary | ICD-10-CM

## 2019-11-13 DIAGNOSIS — Z8051 Family history of malignant neoplasm of kidney: Secondary | ICD-10-CM

## 2019-11-13 DIAGNOSIS — I890 Lymphedema, not elsewhere classified: Secondary | ICD-10-CM

## 2019-11-13 DIAGNOSIS — R222 Localized swelling, mass and lump, trunk: Secondary | ICD-10-CM

## 2019-11-13 DIAGNOSIS — C4359 Malignant melanoma of other part of trunk: Secondary | ICD-10-CM

## 2019-11-13 DIAGNOSIS — Z955 Presence of coronary angioplasty implant and graft: Secondary | ICD-10-CM

## 2019-11-13 DIAGNOSIS — R946 Abnormal results of thyroid function studies: Secondary | ICD-10-CM

## 2019-11-13 DIAGNOSIS — E7800 Pure hypercholesterolemia, unspecified: Secondary | ICD-10-CM

## 2019-11-13 DIAGNOSIS — Z5112 Encounter for antineoplastic immunotherapy: Secondary | ICD-10-CM

## 2019-11-13 DIAGNOSIS — R0609 Other forms of dyspnea: Secondary | ICD-10-CM

## 2019-11-13 DIAGNOSIS — Z806 Family history of leukemia: Secondary | ICD-10-CM

## 2019-11-13 DIAGNOSIS — Z79899 Other long term (current) drug therapy: Secondary | ICD-10-CM

## 2019-11-13 DIAGNOSIS — Z8052 Family history of malignant neoplasm of bladder: Secondary | ICD-10-CM

## 2019-11-13 DIAGNOSIS — E785 Hyperlipidemia, unspecified: Secondary | ICD-10-CM

## 2019-11-13 DIAGNOSIS — Z791 Long term (current) use of non-steroidal anti-inflammatories (NSAID): Secondary | ICD-10-CM

## 2019-11-13 DIAGNOSIS — Z96641 Presence of right artificial hip joint: Secondary | ICD-10-CM

## 2019-11-13 DIAGNOSIS — Z006 Encounter for examination for normal comparison and control in clinical research program: Secondary | ICD-10-CM

## 2019-11-13 DIAGNOSIS — R609 Edema, unspecified: Secondary | ICD-10-CM

## 2019-11-13 DIAGNOSIS — Z5111 Encounter for antineoplastic chemotherapy: Secondary | ICD-10-CM

## 2019-11-13 DIAGNOSIS — R5383 Other fatigue: Secondary | ICD-10-CM

## 2019-11-13 DIAGNOSIS — D125 Benign neoplasm of sigmoid colon: Secondary | ICD-10-CM

## 2019-11-13 DIAGNOSIS — E1142 Type 2 diabetes mellitus with diabetic polyneuropathy: Secondary | ICD-10-CM

## 2019-11-13 DIAGNOSIS — Z7982 Long term (current) use of aspirin: Secondary | ICD-10-CM

## 2019-11-13 DIAGNOSIS — E119 Type 2 diabetes mellitus without complications: Secondary | ICD-10-CM

## 2019-11-13 LAB — COMPREHENSIVE METABOLIC PANEL
BKR A/G RATIO: 1.1 (ref 1.0–2.2)
BKR ALANINE AMINOTRANSFERASE (ALT): 22 U/L (ref 9–59)
BKR ALBUMIN: 4 g/dL (ref 3.6–4.9)
BKR ALKALINE PHOSPHATASE: 78 U/L (ref 9–122)
BKR ANION GAP: 9 (ref 7–17)
BKR ASPARTATE AMINOTRANSFERASE (AST): 19 U/L (ref 10–35)
BKR AST/ALT RATIO: 0.9
BKR BILIRUBIN TOTAL: 0.4 mg/dL (ref <=1.2)
BKR BLOOD UREA NITROGEN: 21 mg/dL (ref 8–23)
BKR BUN / CREAT RATIO: 26.9 x1000/??L — ABNORMAL HIGH (ref 8.0–23.0)
BKR CALCIUM: 9 mg/dL — ABNORMAL HIGH (ref 8.8–10.2)
BKR CHLORIDE: 99 mmol/L (ref 98–107)
BKR CO2: 27 mmol/L (ref 20–30)
BKR CREATININE: 0.78 mg/dL (ref 0.40–1.30)
BKR EGFR (AFR AMER): >60 mL/min/1.73m2 (ref >60)
BKR EGFR (NON AFRICAN AMERICAN): >60 mL/min/{1.73_m2} (ref >60)
BKR GLOBULIN: 3.8 g/dL — ABNORMAL HIGH (ref 2.3–3.5)
BKR GLUCOSE: 358 mg/dL — ABNORMAL HIGH (ref 70–100)
BKR POTASSIUM: 5 mmol/L (ref 3.3–5.1)
BKR SODIUM: 135 mmol/L — ABNORMAL LOW (ref 136–144)
BKR WAM BASOPHIL ABSOLUTE COUNT: >60 mL/min/1.73m2 — ABNORMAL HIGH (ref >60)
BKR WAM MCHC: 0.78 mg/dL (ref 0.40–1.30)

## 2019-11-13 LAB — CBC WITH AUTO DIFFERENTIAL
BKR PROTEIN TOTAL: 10.9 fL (ref 6.0–11.0)
BKR WAM ABSOLUTE IMMATURE GRANULOCYTES: 0 x 1000/??L (ref 0.0–0.3)
BKR WAM ABSOLUTE LYMPHOCYTE COUNT: 1.5 x 1000/??L (ref 1.0–4.0)
BKR WAM ABSOLUTE NRBC: 0 x 1000/??L (ref 0.0–0.0)
BKR WAM ANALYZER ANC: 6.6 x 1000/??L (ref 1.0–11.0)
BKR WAM BASOPHILS: 0.5 % (ref 0.0–4.0)
BKR WAM EOSINOPHIL ABSOLUTE COUNT: 0.3 x 1000/??L (ref 0.0–1.0)
BKR WAM EOSINOPHILS: 2.9 % (ref 0.0–7.0)
BKR WAM HEMATOCRIT: 43.8 % — ABNORMAL HIGH (ref 37.0–52.0)
BKR WAM HEMOGLOBIN: 14.5 g/dL (ref 12.0–18.0)
BKR WAM IMMATURE GRANULOCYTES: 0.4 % (ref 0.0–3.0)
BKR WAM LYMPHOCYTES: 16.4 % (ref 8.0–49.0)
BKR WAM MCH (PG): 28.8 pg (ref 27.0–31.0)
BKR WAM MCV: 87.1 fL (ref 78.0–94.0)
BKR WAM MONOCYTE ABSOLUTE COUNT: 0.8 x 1000/ÂµL (ref 0.0–2.0)
BKR WAM MONOCYTES: 8.2 % (ref 4.0–15.0)
BKR WAM MPV: 10.9 fL (ref 6.0–11.0)
BKR WAM NEUTROPHILS: 71.6 % (ref 37.0–84.0)
BKR WAM NUCLEATED RED BLOOD CELLS: 0 % (ref 0.0–1.0)
BKR WAM PLATELETS: 281 x1000/ÂµL (ref 140–440)
BKR WAM RDW-CV: 14.6 % — ABNORMAL HIGH (ref 11.5–14.5)
BKR WAM RED BLOOD CELL COUNT: 5 M/ÂµL (ref 3.8–5.9)
BKR WAM WHITE BLOOD CELL COUNT: 9.2 x1000/??L (ref 4.0–10.0)

## 2019-11-13 LAB — MAGNESIUM: BKR MAGNESIUM: 2.1 mg/dL (ref 1.7–2.4)

## 2019-11-13 LAB — CK     (BH GH L LMW YH)
BKR CREATINE KINASE TOTAL: 93 U/L (ref 11–204)
BKR PARTIAL THROMBOPLASTIN TIME: 93 U/L (ref 11–204)

## 2019-11-13 LAB — PT/INR AND PTT (BH GH L LMW YH)
BKR INR: 1 (ref 0.92–1.08)
BKR PROTHROMBIN TIME: 10.7 seconds (ref 9.9–11.5)

## 2019-11-13 LAB — PHOSPHORUS     (BH GH L LMW YH): BKR PHOSPHORUS: 3.2 mg/dL (ref 2.2–4.5)

## 2019-11-13 LAB — TSH: BKR THYROID STIMULATING HORMONE: 5.06 ??IU/mL — ABNORMAL HIGH

## 2019-11-13 LAB — LACTATE DEHYDROGENASE: BKR LACTATE DEHYDROGENASE: 177 U/L (ref 122–241)

## 2019-11-13 LAB — T4, FREE: BKR FREE T4: 0.97 ng/dL

## 2019-11-13 LAB — T3: BKR T3 TOTAL: 99.9 ng/dL

## 2019-11-13 MED ORDER — EPINEPHRINE 0.3 MG/0.3 ML INJECTION, AUTO-INJECTOR
0.3 mg/ mL | INTRAMUSCULAR | Status: DC | PRN
Start: 2019-11-13 — End: 2019-11-18

## 2019-11-13 MED ORDER — DIPHENHYDRAMINE 50 MG/ML INJECTION SOLUTION
50 mg/mL | Freq: Once | INTRAVENOUS | Status: DC | PRN
Start: 2019-11-13 — End: 2019-11-18

## 2019-11-13 MED ORDER — HYDROCORTISONE SODIUM SUCCINATE 100 MG SOLUTION FOR INJECTION
100 mg | Freq: Once | INTRAVENOUS | Status: DC | PRN
Start: 2019-11-13 — End: 2019-11-18

## 2019-11-13 MED ORDER — SODIUM CHLORIDE 0.9 % BOLUS (NEW BAG)
0.9 % | Freq: Once | INTRAVENOUS | Status: DC | PRN
Start: 2019-11-13 — End: 2019-11-18

## 2019-11-13 MED ORDER — FAMOTIDINE 4 MG/ML IN STERILE WATER (ADULT)
Freq: Once | INTRAVENOUS | Status: DC | PRN
Start: 2019-11-13 — End: 2019-11-18

## 2019-11-13 MED ORDER — SODIUM CHLORIDE 0.9 % INTRAVENOUS SOLUTION
INTRAVENOUS | Status: DC | PRN
Start: 2019-11-13 — End: 2019-11-18

## 2019-11-13 MED ORDER — HIC 2000025461 (MRNA-4157)
Freq: Once | INTRAMUSCULAR | Status: CP
Start: 2019-11-13 — End: ?
  Administered 2019-11-13: 17:00:00 1.000 mL via INTRAMUSCULAR

## 2019-11-13 MED ORDER — HIC 2000025461 PEMBROLIZUMAB (MK-3475)
Freq: Once | INTRAVENOUS | Status: CP
Start: 2019-11-13 — End: ?
  Administered 2019-11-13: 18:00:00 100.000 mL/h via INTRAVENOUS

## 2019-11-13 MED ORDER — ALBUTEROL SULFATE 2.5 MG/3 ML (0.083 %) SOLUTION FOR NEBULIZATION
2.5 mg /3 mL (0.083 %) | RESPIRATORY_TRACT | Status: DC | PRN
Start: 2019-11-13 — End: 2019-11-18

## 2019-11-13 MED ORDER — MEPERIDINE 25 MG/2.5 ML IN 0.9% SODIUM CHLORIDE
Freq: Once | INTRAVENOUS | Status: DC | PRN
Start: 2019-11-13 — End: 2019-11-18

## 2019-11-13 MED ORDER — SODIUM CHLORIDE 0.9 % (FLUSH) INJECTION SYRINGE
0.9 % | Status: DC | PRN
Start: 2019-11-13 — End: 2019-11-18

## 2019-11-13 NOTE — Progress Notes
NAME:  Jason Rowland (07/02/1945)AGE: 74 y.o. MRN:  [id number] PROVIDER: Drema Halon, MD, PhDDATE OF SERVICE: 11/13/2019 [facility] [place]  REASON FOR VISIT:  C10D1 of HIC 25461ONC DX:  Invasive melanoma right mid back, stage IIIC (pT4a N3 M0); 30 mm with microsatellitosis, mitotic rate 2 per mm2.0% tumor and immune cell PD-L1 staining50-gene panel:  DNA VARIANT DETECTED ? ? 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 ? 74% TREATMENT HX:- Adjuvant Moderna clinical trial (?A Phase 2 Randomized Study of Adjuvant Immunotherapy with the Personalized Cancer Vaccine [phone] and Pembrolizumab Versus Pembrolizumab Alone After Complete Resection of High-Risk Melanoma? Protocol mRNA-4157-P201, HIC 47829) C1D1 05/08/19.  Randomized to the pembrolizumab and vaccine arm. Onc Hx: Jason Rowland (prefers to be called Jason Rowland) is a 74 y.o. male who worked for 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  He reports it appeared approx 10 months prior; his PCP originally thought it was a pimple.  Dr. Lorn Junes at [facility] later evaluated it and also thought it was a pimple and injected it with intralesional kenalog x 2 and oral cephalexin without improvement.  The lesion increased in size and burned whenever he leaned against anything.  Denies night sweats and weight loss.  Lucie Leather did a I&D on 01/07/19  with only slight bloody discharge, so a punch biopsy was obtain with path showing metastatic melanoma.  On 01/20/19, additional biopsies were done to try to evaluate a primary site, but we do not have this tissue.  Per patient report, these biopsies were negative. MRI brain 01/30/19 without intracranial disease.  PET done 02/07/19 showing preepiglottic FDG activity s/p ENT evaluation which was negative for any visible malignancy.  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 LDH elevated to 251 at diagnosis. His case was presented at the [facility] tumor board on 02/13/19 and consensus was to ask ENT for random biopsies of the BOT and pre-epiglottic PET-avid areas, as we do not want to miss a head and neck cancer but otherwise not felt to be related to his melanoma.  The cervical LN were not thought to be related.  He should also have gastroenterology evaluation for a PET-avid stranding lesion in the transverse colon.  Dr. Landis Gandy (ENT) performed BOT and vallecula random biopsies 02/20/19 which only showed chronic inflammation and no malignancy.  He had a colonoscopy by Dr. Linton Flemings on 02/27/19 showing only a tubular adenoma in the sigmoid colon.  Overall, it was felt that his right mid back lesion is the site of his primary disease, and the metastatic melanoma pathology interpretation may have been complicated due to repeat I&Ds and steroid injections that may have altered the architecture of the primary lesion.  In the absence of any confirmed metastatic disease, he underwent WLE and LND with Dr. Duanne Moron on 03/03/19.  Final path showed a 30 mm thick melanoma, 2 mitoses/mm2, non-brisk TILs, no tumor regression, LVI+, microsatellites+, negative margins, and 3/35 lymph nodes (largest LN 4.5 x 3 cm) were positive for melanoma with presence of lymphatic invasion without extranodal extension.He uses a cane sometimes as he has some balance issues due to neuropathy in his feet.  Otherwise, he is fully functional, active, and independent in ADLs/iADLs.  No hx of autoimmune disease.03/27/19 signed consent for the Moderna trial; randomized to [facility]  and vaccine arm.  Started treatment C1D1 05/08/19.04/24/19 noted to have wound dehiscence, non-infected, healing by secondary intent.  Re-instated VNA services to assist with dressing changes and packing. Wound completely healed by 06/20/19.Surveillance scans 10/27/19 NED.Interval ZO:XWRU returns for follow-up today and C10 of his clinical trial.  He will get pembrolizumab + his personalized vaccine today. His surveillance scans from 10/27/19 show NED.Since his last treatment 3 weeks ago, he notes the following?issues:?GI:-Diarrhea: no episodes since before last cycle of treatment-taste changes/loss of smell and appetite (06/22/19) resolved 08/21/19 back to baseline.  Has some recurrence of sx since (09/25/19) -- still improved from March; now resolved to baseline.-ongoing occasional gas pains Gr1, intermittent relieved with flatus - stable?Resp:-DOE with walking long distances about a quarter of a mile; stable if not improved (pre-dated tx on trial)-no cough?Extremities:- local muscle injection site pain?Gr1?achy pain x2 days post injection- lower extremity swelling is?stable from his chronic edema?Metabolic-?long-acting?insulin dose?50 units qhs.  Now fasting 150s on average for glucose.- fatigue?(Gr1)?since 3/11 which is improving with swimming and working on an exercise bike, and walking. ?Has difficulty going to sleep, stable; still taking his chronic ambien.  Sometimes up until 3 AM.Saw his dermatologist yesterday and had a lesion biopsied from his left shoulder. Denies any fever. ?He has completed his COVID vaccinations.  ECOG:  1Pain: reported as 0 today (does have hx of chronic neuropathy pain)Review of Systems:No fevers, chills, night sweats, lightheadedness, HA, CP, palpitations, wheezing, abd pain, constipation, blood in the urine or stools, dysuria.  Diabetic neuropathy in the hands and feet stable.  He has a prior hx of intermittent loose stools, chronic, without blood depending on his diet (often isolated to a specific restaurant he likes to visit with his buddies).  Positive responses on ROS as noted in interval hx.PAST MEDICAL HISTORY:Past Medical History: Diagnosis Date ? Diabetes mellitus (HC Code)  ? High cholesterol  ? Malignant melanoma of torso excluding breast (HC Code)  SURGICAL HISTORY:Past Surgical History: Procedure Laterality Date ? COLONOSCOPY   ? CORONARY ANGIOPLASTY WITH STENT PLACEMENT Bilateral  ? laryngoscopy with biopsy  02/20/2019 ? ROTATOR CUFF REPAIR Left 1999 ? TONSILLECTOMY    age 74 ? TOTAL HIP ARTHROPLASTY Right 2002 FAMILY HISTORY:Family History Problem Relation Age of Onset ? Heart disease Mother  ? Bladder cancer Father 5 ? Kidney cancer Sister  ? Leukemia Brother 63 ? No Known Problems Daughter  ? No Known Problems Son  SOCIAL HISTORY:Social History Socioeconomic History ? Marital status: Divorced   Spouse name: Not on file ? Number of children: Not on file ? Years of education: Not on file ? Highest education level: Not on file Occupational History ? Not on file Social Needs ? Financial resource strain: Not on file ? Food insecurity   Worry: Not on file   Inability: Not on file ? Transportation needs   Medical: Not on file   Non-medical: Not on file Tobacco Use ? Smoking status: Never Smoker ? Smokeless tobacco: Never Used Substance and Sexual Activity ? Alcohol use: Not Currently ? Drug use: No ? Sexual activity: Not on file Lifestyle ? Physical activity   Days per week: Not on file   Minutes per session: Not on file ? Stress: Not on file Relationships ? Social Manufacturing systems engineer on phone: Not on file   Gets together: Not on file   Attends religious service: Not on file   Active member of club or organization: Not on file   Attends meetings of clubs or organizations:  Not on file   Relationship status: Not on file ? Intimate partner violence   Fear of current or ex partner: Not on file   Emotionally abused: Not on file   Physically abused: Not on file   Forced sexual activity: Not on file Other Topics Concern ? Not on file Social History Narrative  Divorced, works for [employer] in [place] will be retiring 03/17/19.  Has 1 son in [place] and 1 daughter in [place]. Llives in [place]  ALLERGIES:No Known AllergiesMEDICATIONS:Current Outpatient Medications Medication Sig ? acetaminophen (TYLENOL) 325 mg tablet Take 3 tablets (975 mg total) by mouth every 6 (six) hours as needed (mild to moderate pain). ? ALPRAZolam (XANAX) 0.5 mg tablet 0.5 mg.  ? aspirin 81 MG EC tablet Take 81 mg by mouth nightly.  ? bedside commode 3 in 1 commode (Patient not taking: Reported on 03/07/2019) ? docusate sodium (COLACE) 250 mg capsule Take 1 capsule (250 mg total) by mouth 2 (two) times daily as needed for constipation. (Patient not taking: Reported on 08/21/2019) ? FREESTYLE LIBRE 14 DAY sensor kit 1 each by Other route every 14 (fourteen) days. ? glipiZIDE (GLUCOTROL XL) 10 MG 24 hr tablet Take 10 mg by mouth nightly.  ? ibuprofen (ADVIL,MOTRIN) 200 mg tablet Take 2 tablets (400 mg total) by mouth every 6 (six) hours as needed (alternate with tylenol for mild to moderate pain). ? olopatadine (PATANOL) 0.1 % ophthalmic solution as needed (takes for eye irritation as needed).  ? simvastatin (ZOCOR) 20 MG tablet Take 20 mg by mouth nightly.  ? TOUJEO SOLOSTAR U-300 300 unit/mL (1.5 mL) pen Inject 30 Units under the skin nightly. ? walker Misc Use as directed. ? zolpidem (AMBIEN) 10 mg tablet nightly.. No current facility-administered medications for this visit.  Facility-Administered Medications Ordered in Other Visits Medication ? albuterol neb sol 2.5 mg/3 mL (0.083%) (PROVENTIL,VENTOLIN) ? diphenhydrAMINE (BENADRYL) injection 50 mg ? EPINEPHrine (EPI-PEN) auto-injector 0.3 mg ? EPINEPHrine (EPI-PEN) auto-injector 0.3 mg ? famotidine (PF) (PEPCID) 20 mg in water for injection, sterile 5 mL Injection ? hydrocortisone sodium succinate (Solu-CORTEF) injection 100 mg ? meperidine PF (DEMEROL) 25 mg in sodium chloride 0.9% PF 2.5 mL Injection ? sodium chloride 0.9 % (new bag) bolus 500 mL ? sodium chloride 0.9 % flush 10 mL ? sodium chloride 0.9% infusion VITALS: BP (P) 133/85 (Site: r a, Position: Sitting, Cuff Size: Large)  - Pulse (P) 90  - Temp (P) 97.4 ?F (36.3 ?C) (Temporal)  - Resp (P) 17  - Wt (P) 118 kg  - SpO2 (P) 97%  - BMI (P) 37.16 kg/m?  PHYSICAL EXAM:Gen:  NAD, pleasant HEENT:  EOMI, PEARLLN:  No cervical, supraclavicular, axillary LAD.  CV:  RRR, no m/r/g Pulm:  CTA b/lAbd:  Soft, no tenderness, no guarding or rebound, no organomegaly, no ascitesExt:  WWP, 1+ symmetric edema in the ankles.  Lymphedema present in the right arm, unchanged.Neuro:   Grossly intactPsych:  Normal mood and affectSkin:  Scar on the right upper back well healed. Small lateral skin puckering at the edges of the scar stable.  Some flaking skin/dryness in the middle portion of the scar.  Right axillary scar well healed without concerning nodularity.  Small area from yesterday's shave bx on the left upper scapula. LABS:Results for orders placed or performed during the hospital encounter of 11/13/19 PT/INR and PTT     (BH GH L YH) Result Value Ref Range  Prothrombin Time 10.7 9.9 - 11.5 seconds  INR 1.00 0.92 -  1.08  PTT 25.4 22.2 - 29.6 seconds CK     (BH GH L LMW YH) Result Value Ref Range  Total CK 93 11 - 204 U/L Lactate dehydrogenase Result Value Ref Range  LD 177 122 - 241 U/L Phosphorus     (BH GH L LMW YH) Result Value Ref Range  Phosphorus 3.2 2.2 - 4.5 mg/dL MAGNESIUM Result Value Ref Range  Magnesium 2.1 1.7 - 2.4 mg/dL T4, free Result Value Ref Range  Free T4 0.97 See Comment ng/dL T3 Result Value Ref Range  T3, Total 99.9 See Comment ng/dL TSH Result Value Ref Range  Thyroid Stimulating Hormone 5.060 (H) See Comment ?IU/mL Comprehensive metabolic panel Result Value Ref Range  Sodium 135 (L) 136 - 144 mmol/L  Potassium 5.0 3.3 - 5.1 mmol/L  Chloride 99 98 - 107 mmol/L  CO2 27 20 - 30 mmol/L  Anion Gap 9 7 - 17  Glucose 358 (H) 70 - 100 mg/dL  BUN 21 8 - 23 mg/dL  Creatinine 1.61 0.96 - 1.30 mg/dL  Calcium 9.0 8.8 - 04.5 mg/dL  BUN/Creatinine Ratio 40.9 (H) 8.0 - 23.0  Total Protein 7.8 6.6 - 8.7 g/dL  Albumin 4.0 3.6 - 4.9 g/dL  Total Bilirubin 0.4 <=8.1 mg/dL  Alkaline Phosphatase 78 9 - 122 U/L  Alanine Aminotransferase (ALT) 22 9 - 59 U/L  Aspartate Aminotransferase (AST) 19 10 - 35 U/L  Globulin 3.8 (H) 2.3 - 3.5 g/dL  A/G Ratio 1.1 1.0 - 2.2  AST/ALT Ratio 0.9 See Comment  eGFR (Afr Amer) >60 >60 mL/min/1.46m2  eGFR (NON African-American) >60 >60 mL/min/1.66m2 CBC auto differential Result Value Ref Range  WBC 9.2 4.0 - 10.0 x1000/?L  RBC 5.0 3.8 - 5.9 M/?L  Hemoglobin 14.5 12.0 - 18.0 g/dL  Hematocrit 19.1 47.8 - 52.0 %  MCV 87.1 78.0 - 94.0 fL  MCHC 33.1 31.0 - 36.0 g/dL  RDW-CV 29.5 (H) 62.1 - 14.5 %  Platelets 281 140 - 440 x1000/?L  MPV 10.9 6.0 - 11.0 fL  ANC (Abs Neutrophil Count) 6.6 1.0 - 11.0 x 1000/?L  Neutrophils 71.6 37.0 - 84.0 %  Lymphocytes 16.4 8.0 - 49.0 %  Absolute Lymphocyte Count 1.5 1.0 - 4.0 x 1000/?L  Monocytes 8.2 4.0 - 15.0 %  Monocyte Absolute Count 0.8 0.0 - 2.0 x 1000/?L  Eosinophils 2.9 0.0 - 7.0 %  Eosinophil Absolute Count 0.3 0.0 - 1.0 x 1000/?L  Basophil 0.5 0.0 - 4.0 %  Basophil Absolute Count 0.1 (H) 0.0 - 0.0 x 1000/?L  Immature Granulocytes 0.4 0.0 - 3.0 %  Absolute Immature Granulocyte Count 0.0 0.0 - 0.3 x 1000/?L  nRBC 0.0 0.0 - 1.0 %  Absolute nRBC 0.0 0.0 - 0.0 x 1000/?L  MCH 28.8 27.0 - 31.0 pg IMAGING/PATH:[place] abd/pelvis 10/27/19:  NEDCT chest 10/27/19:  NED; new small bilateral pleural effusions.  Stable 4 mm LLL nodule.MRI Brain and [place] CAP 04/17/19:  NEDPET 02/07/19:Head And Neck: Base of tongue and preepiglottic FDG activity is nonspecific (SUV max 6.6).Mildly FDG avid cervical lymph nodes are nonspecific, for example:*  Left level 2 (Wood image 874, 1.4 x 0.8 cm, SUV max 2.6)*  Right level 2 (Joffre image 878, 1.4 x 1 cm, SUV max 2.1)CHEST:Hypermetabolic mass overlying the right posterior 10th rib is consistent with malignancy, and involves the skin, subcutaneous fat, extending to the latissimus dorsi muscle (SUV max 15, 6.3 x 3 x 5.9 cm).Two hypermetabolic right axillary lymph nodes are highly suspicious for metastases (up to  SUV max 8.3, 2.4 x 1.7 cm). Multiple additional smaller right subpectoral lymph nodes are minimally FDG avid, and are indeterminate (SUV max 1.2, subcentimeter).Azygos pulmonary fissure is incidentally noted. No suspicious pulmonary nodule is identified, although pulmonary detail is partially obscured by breathing motion. Coronary arteries and the aortic valve are calcified. The heart is on the upper bound of normal size. Nonspecific pattern of myocardial FDG activity. No mediastinal lymphadenopathy is identified.Abdomen/Pelvis: Diffuse hepatic and splenic heterogeneity most likely represents artifactual image noise, however this could conceal any small lesions.Multiple colonic diverticuli show no evidence of diverticulitis. Diffuse patchy colonic FDG activity is nonspecific, commonly inflammatory or related to a pharmacologic effect, however this could conceal any small lesions (SUV max 9 the sigmoid). Left renal cortical defect associated with a punctuate calcification likely represents scarring. Right renal calyceal calcifications likely represent nonobstructing stones (up to 0.5 cm).Normal appearance of the gallbladder, spleen, stomach, pancreas, adrenal glands, small bowel, decompressed urinary bladder.Streak artifacts arising from metallic right hip prostheses obscure nearby structures.Small retroperitoneal lymph nodes are not FDG avid, although below resolution of PET. Otherwise, no retroperitoneal, mesenteric, or pelvic lymphadenopathy is identified. Inguinal lymph nodes are nonspecific (SUV max 1.5), commonly inflammatory.MUSCULOSKELETAL:Heterogeneous marrow FDG activity is likely due to a combination of artifactual image noise, and marrow activation, however this reduces sensitivity for any small lesions (SUV max 4.6 in the sacrum). Right knee FDG activity associated with cruciate ligaments and synovium is likely degenerative or posttraumatic (SUV max 5), with small knee effusion (SUV max 3.3).IMPRESSION:1.  The accession number [id number] does not have any associated images in PACS at the time of dictation.  PET-[place] images associated with the accession number [id number] / [id number] were interpreted in this report.2.  Hypermetabolic mass overlying the right posterior 10th rib is consistent with malignancy, and involves the skin, subcutaneous fat, extending to the latissimus dorsi muscle.3.  Right axillary nodal metastases.4.  Multiple small right subpectoral lymph nodes are indeterminate; metastases are not excluded.5.  Base of tongue and preepiglottic FDG activity is nonspecific, with slight asymmetric fullness on the left. Direct visualization is recommended to exclude primary neoplasm.6.  Minimally FDG avid bilateral level 2 cervical lymph nodes are nonspecific, possibly inflammatory.MRI brain 01/30/19:There is no intracranial hemorrhage or major vascular distribution infarct.No abnormal enhancement is seen.There is no mass effect, edema, or midline shift.The ventricles are symmetric and normal in size. Several scattered foci of T2/FLAIR hyperintense signal are noted in the periventricular and subcortical white matter, likely sequela of chronic small vessel ischemic disease. No extra-axial collection is seen.Prior lens replacements noted bilaterally.The paranasal sinuses are clear. Small right mastoid effusion noted.IMPRESSION:No evidence of intracranial metastatic disease.WLE and SLN 03/03/19:LYMPH NODES, RIGHT AXILLARY LEVELS 1, 2 AND 3, LYMPH NODE DISSECTION: ?  ? ? - ?THREE OF THIRTY-FIVE LYMPH NODES, POSITIVE FOR MELANOMA (3 OF 35) WITH LARGEST INVOLVED LYMPH NODE MEASURING 4.5 X 3 CM ? ? ?- LYMPHATIC INVASION IDENTIFIED ? ? ?- NO EXTRANODAL EXTENSION IDENTIFIED 1. ?SKIN, BACK, RIGHT UPPER, EXCISION: ? ? ? ?- MALIGNANT MELANOMA, SEE NOTE AND SYNOPTIC SUMMARY Note: Sections show a tumor composed of S100 positive epithelioid cells within the dermis and subcutaneous tissue. Cytokeratin AE1/AE3 and desmin are negative. A junctional component is not identified. The findings would be compatible with a primary dermal melanoma or a metastatic lesion. Clinical correlation is suggested. 2. ?SKIN, BACK, EXCISION: ? ? ? ?- BENIGN FIBROADIPOSE TISSUE AND SKELETAL MUSCLE SYNOPTIC SUMMARY MELANOMA OF THE SKIN Tumor Site: ? ? Back Laterality: ? ? Right Procedure: ? ?  Primary excision Maximum Tumor Thickness (Depth): ? ? 30 mm Ulceration: ? ? Not identified Mitotic Rate (Mitoses/mm2): ? ? 2 Anatomic Level: ? ? V (Melanoma invades subcutis) Growth Phase: ? ? Vertical Histologic Type: ? ? Melanoma, NOS Tumor-Infiltrating Lymphocytes: ? ? Present, non-brisk Tumor Regression: ? ? Not identifed Lymphovascular Invasion: ? ? Present Microsatellitosis: ? ? Present Margins ? ? ?Peripheral Margins: ? ? Uninvolved Deep Margin: ? ? Uninvolved Stage (AJCC 8th Ed): ? ? pT4a Nx Base of the tongue and vallecula random biopsies 02/20/19: 1. ?TONGUE, LEFT, BASE, BIOPSY: ? ? ? ?- LYMPHOID TISSUE HYPERPLASIA ? ? ?- NEGATIVE FOR MALIGNANCY 2. ?THROAT, VALLECULA, BIOPSY: ? ?  ? - LYMPHOID TISSUE HYPERPLASIA AND FOCAL CHRONIC INFLAMMATION. SEE NOTE. ? ? ?- NEGATIVE FOR MALIGNANCY ? ? ? ? ? Note: Immunostain of Sox-10 to investigate focal pigment is negative, supporting above interpretation. 3. ?THROAT, LEFT VALLECULA, BIOPSY: ? ? ? ?- LYMPHOID TISSUE HYPERPLASIA ? ? ?- NEGATIVE FOR MALIGNANCY 4. ?THROAT, RIGHT VALLECULA, BIOPSY: ? ? ? ? ? ? - LYMPHOID TISSUE HYPERPLASIA AND FOCAL CHRONIC INFLAMMATION ? ? ?- NEGATIVE FOR MALIGNANCY 5. ?TONGUE, RIGHT BASE, BIOPSY: ? ? ? ? ? ? - LYMPHOID TISSUE HYPERPLASIA AND FOCAL CHRONIC INFLAMMATION ? ? ?- NEGATIVE FOR MALIGNANCY Pathology (reviewed at [facility]), collected 01/07/19:DIAGNOSIS: ? 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 Clinical pathological correlation is recommended. MICROSCOPIC DESCRIPTION: There are atypical cells in a nodule in the dermis. The cells are positive with SOX-10 and by report are negative with LCA, Kappa, Lambda, and a cytokeratin. The cells are also positive with S-100 by report.ASSESSMENT/PLAN:Jason Rowland is a 74 y.o. 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  Initially path read as metastatic melanoma, but further workup confirms that this is the primary site, not a metastatic lesion.  Initial biopsy was likely altered due to repeat I&Ds of the area.  He had a FBSE with his dermatologist and no additional primary lesions were identified.  He is otherwise in good health except for metabolic syndrome and diabetes-associated neuropathy.  PET 02/07/19 showed the main lesion on the right upper back extending to the latissimus dorsi muscle with involved right axillary nodes and nonspecific avidity in the BOT and preepiglottis for which direct visual evaluation was recommended and done 02/11/19 and random biopsies 02/20/19 without concerns for malignancy. Colonoscopy was also done to evaluate stranding in the transverse colon, which was negative for malignancy on exam.  Now s/p WLE and LND with Dr. Duanne Moron on 03/03/19 showing a stage IIIC melanoma that was 30 mm thick, positive for LVI and microsatellites with 3/25 positive LNs.  Given the thickness of his primary melanoma and microsatellitosis, we felt that his melanoma-specific survival is closer to a stage IIID:  60% over 10 years with stage IIIC disease versus 24% with stage IIID.  He does not have a BRAF mutation, so SOC options are immune therapy at this point versus the Moderna clinical trial (?A Phase 2 Randomized Study of Adjuvant Immunotherapy with the Personalized Cancer Vaccine [phone] and Pembrolizumab Versus Pembrolizumab Alone After Complete Resection of High-Risk Melanoma?) vs active surveillance (for which he would be extremely high risk for relapsing).  He opted for the Moderna trial and signed consent on 03/27/19; tissue passed QC inspection.  Jason Rowland was randomized to the [facility]  with vaccine arm of the trial.  He has experienced some weight loss due to loss of taste/smell/appetite after initiation the clinical trial, which is now resolved with weight gain. He has stable right arm lymphedema but declines any lymphedema therapy or sleeves.  He also had some G2 diarrhea after C4, which resolved with Imodium and not requiring anti-diarrheals by C7.  No diarrhea since his last treatment.  Energy is improved after starting an exercise regimen.- labs reviewed today.  LDH wnl. LFTs wnl.  Bilis are normal.  TSH mildly elevated to 5.06 which may represent beginning of subclinical hypothyroidism.  FT4 wnl so will continue to monitor for now.  Ok for treatment C10 today. Will get pembrolizumab with vaccine. - [place] CAP 7/12 NED; surveillance scans every 12 weeks.  - routine f/u with dermatologist every 4-6 months.  Last seen 11/11/19 with one biopsy taken from the left superior scapula.- RTC in 3 weeks per trial schedule; instructed to call if any issues or questions in the interim30 min were spent in direct patient contact.  He has our clinic contact information and instructed to call if questions.  Drema Halon, MD, PhDMedical Oncology

## 2019-11-13 NOTE — Progress Notes
PT to clinic for D1C10 mRNA-4157/Pembrolizumab. PIV placed and labs drawn by Riley Nearing RN. Flushes, +blood return. PT seen by MD Laveda Norman, cleared for treatment. PT offers no new complaints. Research labs drawn. MRNA administered in Right deltoid at 1329. Pembrolizumab up 1335, down at 1405. Flushed with 30cc NS per protocol.  PIV flushed, +blood return, removed. Left in stable condition.

## 2019-11-14 NOTE — Progress Notes
HIC #?[id number]?Cycle?10, Day 1?Patient seen and examined by?Dr. Romona Curls and toxicity assessment reviewed?by?Dr. Lucianne Muss labs are deemed NCS today, Concomitant medications reviewed and updated.Approved for treatment?for pembro and vaccine dose?today per??Dr. Laveda Norman.??ECOG PS =?1??Concomitant medications:?MEDICATION SINGLE DOSE UNITS ROUTE FREQUECY START DATE STOP DATE INDICATION Aspirin 81 mg PO QD 2010 ongoing PPX CVD Simvastatin 20 Mg PO QD 2012 ongoing Hypercholesterolemia ?Ambien 10 mg PO QD 2016 ongoing insomnia ?Glucoside 10 mg PO QD 2014 ongoing diabetes ?Toujeo Solostar ?80 Units SQ QD ?2016 ?07/08/19 diabetes ?guaifenesin 400 mg PO q 4 hours prn 07/10/19 08/16/19 cough ?imodium 2 Mg PO PRN 07/19/19 ongoing diarrhea Toujeo Solostar 40-50 units subcutaneous QD 07/09/19 10/01/19 diabetes ?Toujeo Solostar 60 units SQ QD 10/02/19 ?ongoing diabetes ? ? ? ? ? ? ? ? ? ? ? ? ? ? ? ? ??Medical History:?? ? ? ? ? ? ? ? ? ? ? ? ? ? ? ? ? ? ? ? ? ? ? ? ? ? ? ? PATIENT NAME:?Jason Rowland??STUDY ID: ?[id number] ? ? 0=NA ?????????????????????1= Unrelated ????????2= Unlikely 3=Possible ????????4= Probable ??????????5. Definite ([place]) 0= No Action Taken ????????????????????????????????????????1= Con Med ???????????????????????????????????????????????????????????2=Dose Modified ???????????????????????????????????????3=Dose Delayed ????????????????????????????????????????????????????????4= Patient Hospitalized ?????????????????????????????????????5= Patient Taken Off Study ???????????????????6=Non Drug Therapy ???????????????????????????????????????7=Other (Specify) 1=Recovered ??????????????????????2=Still under txmt/observation ?????????????????????????????3=Alive w/Sequelae ????????????????????????????????????????????????????????????????4=Died ????????????????????????5=Resolvd to Baseline ????????????6=Grade Change ? ? ? Medical History ? ? ? ? ? ? ? ? ? ? ? ADVERSE EVENT  Is AE Intermittent? Y/N SAE ????????????Y/N Grade Expect?PEMBRO?Y/N ?Relation to?PEMBRO Expect?mRNA????????????Y/N ?Relation to?mRNA Start Date End Date/ or ?Ongoing Action Taken ??????????????(Select # above) AE OUTCOME ? Hypercholesterolemia N N 1 Med Hx Med Hx Med Hx Med Hx 2012 ongoing 1=?simvastatin 2 ? diabetes N N 1 Med Hx Med Hx Med Hx Med Hx 2010 ongoing 1-?glucoside; toujeo 2 ? Insomnia N N 1 Med Hx Med Hx Med Hx Med Hx 2016 ongoing 1- ambien 2 ? Edema?limbs N N 1 Med Hx? Med Hx Med Hx Med Hx ?03/03/2019 ongoing 0 2 ? Diarrhea? N N 1 Y? 1 ?N/A ?N/A ?05/27/2019 05/27/2019 0 5 ? fatigue? ?N N? 1? ?Y 3? ?Y 3? ?06/26/19 ?ongoing ?0 2? ? dysgeusia N N 1 N 2 N 2 06/22/19 08/21/19 0 2 ? anorexia N N 1 Y 2 Y 2 06/22/19 08/21/19 0 2 ? Pain, injection site Y N 1 N 1 Y 4 06/19/19 06/23/19 0 2 ? Eye disorders, other (left eye droop) N N 1 N 2 N 2 07/01/19 07/31/19 0 2 ? vomiting N N 1 Y 3 Y 3 07/08/19 07/08/19 0 1 ? Cathlyn Parsons 1 Y 3 Y 3 07/08/19 07/08/19 0 2 ? cough N N 1 Y 3 Y 3 07/08/19 08/16/19 1 = gueifenisen 2 ? ALT increased N N 1 Y 3 Y 3 07/10/19 07/31/19 0 2 ? AST increased  N N 1 Y 3 Y 3 07/10/19 07/31/19 0 2 ? Alkaline phos increased N N 2 Y 3 Y 3 07/10/19 07/31/19 0 2 ? Diarrhea? Y N 2 Y 3 Y 3 07/09/19 07/20/2019 1- imodium 1 ? Injection site pain Y N 1 N 1 Y 3 07/08/19 ongoing 0 2 ? bloating Y N 1 N 1 N 1 07/08/19 ongoing 0 2 ? Bilateral foot neuropathy N N 1 N/A N/A N/A N/A Baseline 02/2019 ongoing 0 2 ? Dyspnea on exertion Y N 1 N/A N/A N/A N/A Baseline 02/2019 ongoing 0 2 ? Diarrhea? N N 1 Y 1 Y 1 09/25/19? 09/25/19 1- imodium 1 ? Anorexia? N  N 1 Y 2 Y 2 10/02/19  11/13/19 0 2 ? Diarrhea? N N 1 Y 1 Y 1 10/16/19? 10/17/19 0 1 ? ? ? ? ? ? ? ? ? ? ? ? ? ? ? ? ? ? ? ? ? ? ? ? ? ? ? ? ? ? ? ? ? ? ? ? ? ? ? ? ? ? ? ? ? ? ? ? ? ? ? ? ? ? ? ? ? ? ? ? ? ? ? ? ? ? ? ? ? ? ? ? ? ? ? ? ? ? ? ? ?Labs:Results for HARNOOR, KOHLES (MRN [id number])  Ref. Range 11/13/2019 10:45 Sodium Latest Ref Range: 136 - 144 mmol/L 135 (L) Potassium Latest Ref Range: 3.3 - 5.1 mmol/L 5.0 Chloride Latest Ref Range: 98 - 107 mmol/L 99 CO2 Latest Ref Range: 20 - 30 mmol/L 27 Anion Gap Latest Ref Range: 7 - 17  9 BUN Latest Ref Range: 8 - 23 mg/dL 21 Creatinine Latest Ref Range: 0.40 - 1.30 mg/dL 4.09 BUN/Creatinine Ratio Latest Ref Range: 8.0 - 23.0  26.9 (H) eGFR (NON African-American) Latest Ref Range: >60 mL/min/1.23m2 >60 eGFR (Afr Amer) Latest Ref Range: >60 mL/min/1.41m2 >60 Glucose Latest Ref Range: 70 - 100 mg/dL 811 (H) Calcium Latest Ref Range: 8.8 - 10.2 mg/dL 9.0 Magnesium Latest Ref Range: 1.7 - 2.4 mg/dL 2.1 Phosphorus Latest Ref Range: 2.2 - 4.5 mg/dL 3.2 Total Bilirubin Latest Ref Range: <=1.2 mg/dL 0.4 Alkaline Phosphatase Latest Ref Range: 9 - 122 U/L 78 Alanine Aminotransferase (ALT) Latest Ref Range: 9 - 59 U/L 22 Aspartate Aminotransferase (AST) Latest Ref Range: 10 - 35 U/L 19 AST/ALT Ratio Latest Ref Range: See Comment  0.9 LD Latest Ref Range: 122 - 241 U/L 177 Total Protein Latest Ref Range: 6.6 - 8.7 g/dL 7.8 Albumin Latest Ref Range: 3.6 - 4.9 g/dL 4.0 Globulin Latest Ref Range: 2.3 - 3.5 g/dL 3.8 (H) A/G Ratio Latest Ref Range: 1.0 - 2.2  1.1 Total CK Latest Ref Range: 11 - 204 U/L 93 T3, Total Latest Ref Range: See Comment ng/dL 91.4 Thyroid Stimulating Hormone, 3rd Gen. Latest Ref Range: See Comment ?IU/mL 5.060 (H) Free T4 Latest Ref Range: See Comment ng/dL 7.82 WBC Latest Ref Range: 4.0 - 10.0 x1000/?L 9.2 RBC Latest Ref Range: 3.8 - 5.9 M/?L 5.0 Hemoglobin Latest Ref Range: 12.0 - 18.0 g/dL 95.6 Hematocrit Latest Ref Range: 37.0 - 52.0 % 43.8 MCV Latest Ref Range: 78.0 - 94.0 fL 87.1 MCH Latest Ref Range: 27.0 - 31.0 pg 28.8 MCHC Latest Ref Range: 31.0 - 36.0 g/dL 21.3 RDW-CV Latest Ref Range: 11.5 - 14.5 % 14.6 (H) nRBC Latest Ref Range: 0.0 - 1.0 % 0.0 Platelets Latest Ref Range: 140 - 440 x1000/?L 281 MPV Latest Ref Range: 6.0 - 11.0 fL 10.9 Neutrophils Latest Ref Range: 37.0 - 84.0 % 71.6 Lymphocytes Latest Ref Range: 8.0 - 49.0 % 16.4 Monocytes Latest Ref Range: 4.0 - 15.0 % 8.2 Eosinophils Latest Ref Range: 0.0 - 7.0 % 2.9 Basophils Latest Ref Range: 0.0 - 4.0 % 0.5 ANC (Abs Neutrophil Count) Latest Ref Range: 1.0 - 11.0 x 1000/?L 6.6 Absolute Lymphocyte Count Latest Ref Range: 1.0 - 4.0 x 1000/?L 1.5 Monocytes (Absolute) Latest Ref Range: 0.0 - 2.0 x 1000/?L 0.8 Eosinophil Absolute Count Latest Ref Range: 0.0 - 1.0 x 1000/?L 0.3 Basophils Absolute Latest Ref Range: 0.0 - 0.0 x 1000/?L 0.1 (H) Immature Granulocytes (Abs) Latest Ref Range: 0.0 - 0.3 x 1000/?L  0.0 nRBC Absolute Latest Ref Range: 0.0 - 0.0 x 1000/?L 0.0 Immature Granulocytes Latest Ref Range: 0.0 - 3.0 % 0.4 Prothrombin Time Latest Ref Range: 9.9 - 11.5 seconds 10.7 INR Latest Ref Range: 0.92 - 1.08  1.00 PTT Latest Ref Range: 22.2 - 29.6 seconds 25.4

## 2019-11-14 NOTE — Progress Notes
HIC#: [id number] Title:  [employer] 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  The following topics were presented and discussed:-Study Title-Principal Investigator and Contact Information-Invitation to Participate and Description of Study-Purpose-Study Procedures and Randomization-Treatment Period-End of Treatment-Follow Up Period-Potential Risks, Side Effects, Discomforts, and Inconveniences-Methods of contraception-Benefits-Economic Considerations-Treatment Alternatives-Confidentiality and Authorization to collect, use, and disclose Protected Health Information-Voluntary Participation and [facility] -Optional SpecimensThe participant was given ample time to review the document and ask and receive answers to questions by the investigator.  The participant verbalized understanding of the above information and stated all their questions were answered to their satisfaction. The consent was willingly signed by the participant/LAR, and a signed copy was provided to the participant to retain for their personal records. The participant agrees to comply with study and follow-up procedures. The Principal Investigator?s contact information was provided to the participant. The informed consent was signed prior to initiating any study-related procedures. Optional Consent Question(s) answers below Jason Rowland initialed YES to the optional biopsy if he were to recurAndrew initialed NO to the optional apheresis Visit Comment: Jason Rowland had all of his questions answered to his satisfaction today in clinic. He opted to sign consent and continue treatment on the trial

## 2019-11-18 ENCOUNTER — Inpatient Hospital Stay: Admit: 2019-11-18 | Discharge: 2019-11-18 | Payer: BLUE CROSS/BLUE SHIELD

## 2019-11-18 DIAGNOSIS — Z20828 Contact with and (suspected) exposure to other viral communicable diseases: Secondary | ICD-10-CM

## 2019-11-18 DIAGNOSIS — Z20822 Contact with and (suspected) exposure to covid-19: Secondary | ICD-10-CM

## 2019-11-19 LAB — SARS COV-2 (COVID-19) RNA-~~LOC~~ LABS (BH GH LMW YH): BKR SARS-COV-2 RNA (COVID-19) (YH): NOT DETECTED

## 2019-12-04 ENCOUNTER — Encounter: Admit: 2019-12-04 | Payer: PRIVATE HEALTH INSURANCE

## 2019-12-04 ENCOUNTER — Inpatient Hospital Stay: Admit: 2019-12-04 | Discharge: 2019-12-04 | Payer: BLUE CROSS/BLUE SHIELD

## 2019-12-04 ENCOUNTER — Ambulatory Visit: Admit: 2019-12-04 | Payer: BLUE CROSS/BLUE SHIELD

## 2019-12-04 DIAGNOSIS — E7800 Pure hypercholesterolemia, unspecified: Secondary | ICD-10-CM

## 2019-12-04 DIAGNOSIS — E8881 Metabolic syndrome: Secondary | ICD-10-CM

## 2019-12-04 DIAGNOSIS — Z7982 Long term (current) use of aspirin: Secondary | ICD-10-CM

## 2019-12-04 DIAGNOSIS — Z5112 Encounter for antineoplastic immunotherapy: Secondary | ICD-10-CM

## 2019-12-04 DIAGNOSIS — Z8249 Family history of ischemic heart disease and other diseases of the circulatory system: Secondary | ICD-10-CM

## 2019-12-04 DIAGNOSIS — Z006 Encounter for examination for normal comparison and control in clinical research program: Secondary | ICD-10-CM

## 2019-12-04 DIAGNOSIS — E1142 Type 2 diabetes mellitus with diabetic polyneuropathy: Secondary | ICD-10-CM

## 2019-12-04 DIAGNOSIS — Z5111 Encounter for antineoplastic chemotherapy: Secondary | ICD-10-CM

## 2019-12-04 DIAGNOSIS — Z794 Long term (current) use of insulin: Secondary | ICD-10-CM

## 2019-12-04 DIAGNOSIS — C4359 Malignant melanoma of other part of trunk: Secondary | ICD-10-CM

## 2019-12-04 DIAGNOSIS — C799 Secondary malignant neoplasm of unspecified site: Secondary | ICD-10-CM

## 2019-12-04 DIAGNOSIS — Z79899 Other long term (current) drug therapy: Secondary | ICD-10-CM

## 2019-12-04 DIAGNOSIS — R197 Diarrhea, unspecified: Secondary | ICD-10-CM

## 2019-12-04 DIAGNOSIS — Z791 Long term (current) use of non-steroidal anti-inflammatories (NSAID): Secondary | ICD-10-CM

## 2019-12-04 DIAGNOSIS — I890 Lymphedema, not elsewhere classified: Secondary | ICD-10-CM

## 2019-12-04 DIAGNOSIS — Z955 Presence of coronary angioplasty implant and graft: Secondary | ICD-10-CM

## 2019-12-04 DIAGNOSIS — E119 Type 2 diabetes mellitus without complications: Secondary | ICD-10-CM

## 2019-12-04 DIAGNOSIS — Z8052 Family history of malignant neoplasm of bladder: Secondary | ICD-10-CM

## 2019-12-04 DIAGNOSIS — E785 Hyperlipidemia, unspecified: Secondary | ICD-10-CM

## 2019-12-04 LAB — CBC WITH AUTO DIFFERENTIAL
BKR AST/ALT RATIO: 0.3 % (ref 0.0–3.0)
BKR WAM ABSOLUTE IMMATURE GRANULOCYTES: 0 x 1000/??L (ref 0.0–0.3)
BKR WAM ABSOLUTE LYMPHOCYTE COUNT: 1.9 x 1000/ÂµL (ref 1.0–4.0)
BKR WAM ABSOLUTE NRBC: 0 x 1000/??L (ref 0.0–0.0)
BKR WAM ANALYZER ANC: 5.8 x 1000/ÂµL (ref 1.0–11.0)
BKR WAM BASOPHIL ABSOLUTE COUNT: 0.1 x 1000/ÂµL — ABNORMAL HIGH (ref 0.0–0.0)
BKR WAM BASOPHILS: 0.7 % (ref 0.0–4.0)
BKR WAM EOSINOPHIL ABSOLUTE COUNT: 0.3 x 1000/ÂµL (ref 0.0–1.0)
BKR WAM EOSINOPHILS: 3.4 % (ref 0.0–7.0)
BKR WAM HEMATOCRIT: 43.1 % (ref 37.0–52.0)
BKR WAM HEMOGLOBIN: 14.3 g/dL (ref 12.0–18.0)
BKR WAM IMMATURE GRANULOCYTES: 0.3 % (ref 0.0–3.0)
BKR WAM LYMPHOCYTES: 21.8 % (ref 8.0–49.0)
BKR WAM MCH (PG): 28.8 pg (ref 27.0–31.0)
BKR WAM MCHC: 33.2 g/dL (ref 31.0–36.0)
BKR WAM MCV: 86.7 fL (ref 78.0–94.0)
BKR WAM MONOCYTE ABSOLUTE COUNT: 0.6 x 1000/??L (ref 0.0–2.0)
BKR WAM MONOCYTES: 7.2 % (ref 4.0–15.0)
BKR WAM MPV: 10.4 fL (ref 6.0–11.0)
BKR WAM NEUTROPHILS: 66.6 % (ref 37.0–84.0)
BKR WAM NUCLEATED RED BLOOD CELLS: 0 % (ref 0.0–1.0)
BKR WAM PLATELETS: 261 x1000/ÂµL (ref 140–440)
BKR WAM RDW-CV: 14.3 % (ref 11.5–14.5)
BKR WAM RED BLOOD CELL COUNT: 5 M/ÂµL (ref 3.8–5.9)
BKR WAM WHITE BLOOD CELL COUNT: 8.6 x1000/??L (ref 4.0–10.0)

## 2019-12-04 LAB — COMPREHENSIVE METABOLIC PANEL
BKR A/G RATIO: 1.2 mg/dL (ref 1.0–2.2)
BKR ALANINE AMINOTRANSFERASE (ALT): 34 U/L (ref 9–59)
BKR ALANINE AMINOTRANSFERASE (ALT): >60 mL/min/1.73m2 (ref >60)
BKR ALBUMIN: 4 g/dL (ref 3.6–4.9)
BKR ALKALINE PHOSPHATASE: 74 U/L (ref 9–122)
BKR ANION GAP: 11 (ref 7–17)
BKR ASPARTATE AMINOTRANSFERASE (AST): 34 U/L (ref 10–35)
BKR AST/ALT RATIO: 1
BKR BILIRUBIN TOTAL: 0.4 mg/dL (ref <=1.2)
BKR BLOOD UREA NITROGEN: 22 mg/dL (ref 8–23)
BKR BUN / CREAT RATIO: 30.1 — ABNORMAL HIGH (ref 8.0–23.0)
BKR CALCIUM: 9 mg/dL (ref 8.8–10.2)
BKR CHLORIDE: 99 mmol/L (ref 98–107)
BKR CO2: 27 mmol/L (ref 20–30)
BKR CREATININE: 0.73 mg/dL (ref 0.40–1.30)
BKR EGFR (AFR AMER): >60 mL/min/{1.73_m2} (ref >60)
BKR EGFR (NON AFRICAN AMERICAN): >60 mL/min/1.73m2 (ref >60)
BKR GLOBULIN: 3.4 g/dL (ref 2.3–3.5)
BKR GLUCOSE: 220 mg/dL — ABNORMAL HIGH (ref 70–100)
BKR POTASSIUM: 4.3 mmol/L (ref 3.3–5.1)
BKR PROTEIN TOTAL: 7.4 g/dL (ref 6.6–8.7)
BKR SODIUM: 137 mmol/L (ref 136–144)

## 2019-12-04 LAB — LACTATE DEHYDROGENASE: BKR LACTATE DEHYDROGENASE: 201 U/L (ref 122–241)

## 2019-12-04 LAB — T3: BKR T3 TOTAL: 84.7 ng/dL

## 2019-12-04 LAB — TSH: BKR THYROID STIMULATING HORMONE: 4.38 ??IU/mL — ABNORMAL HIGH

## 2019-12-04 LAB — T4, FREE: BKR FREE T4: 0.94 ng/dL (ref 22.2–29.6)

## 2019-12-04 LAB — MAGNESIUM: BKR MAGNESIUM: 1.6 mg/dL — ABNORMAL LOW (ref 1.7–2.4)

## 2019-12-04 LAB — PT/INR AND PTT (BH GH L LMW YH)
BKR INR: 1.04 (ref 0.92–1.08)
BKR PARTIAL THROMBOPLASTIN TIME: 24.6 s (ref 22.2–29.6)
BKR PROTHROMBIN TIME: 11.1 seconds (ref 9.9–11.5)

## 2019-12-04 LAB — PHOSPHORUS     (BH GH L LMW YH): BKR PHOSPHORUS: 3.3 mg/dL (ref 2.2–4.5)

## 2019-12-04 LAB — CK     (BH GH L LMW YH): BKR CREATINE KINASE TOTAL: 112 U/L (ref 11–204)

## 2019-12-04 MED ORDER — MEPERIDINE 25 MG/2.5 ML IN 0.9% SODIUM CHLORIDE
Freq: Once | INTRAVENOUS | Status: DC | PRN
Start: 2019-12-04 — End: 2019-12-09

## 2019-12-04 MED ORDER — HYDROCORTISONE SODIUM SUCCINATE 100 MG SOLUTION FOR INJECTION
100 mg | Freq: Once | INTRAVENOUS | Status: DC | PRN
Start: 2019-12-04 — End: 2019-12-09

## 2019-12-04 MED ORDER — EPINEPHRINE 0.3 MG/0.3 ML INJECTION, AUTO-INJECTOR
0.3 mg/ mL | INTRAMUSCULAR | Status: DC | PRN
Start: 2019-12-04 — End: 2019-12-09

## 2019-12-04 MED ORDER — DIPHENHYDRAMINE 50 MG/ML INJECTION SOLUTION
50 mg/mL | Freq: Once | INTRAVENOUS | Status: DC | PRN
Start: 2019-12-04 — End: 2019-12-09

## 2019-12-04 MED ORDER — FAMOTIDINE 4 MG/ML IN STERILE WATER (ADULT)
Freq: Once | INTRAVENOUS | Status: DC | PRN
Start: 2019-12-04 — End: 2019-12-09

## 2019-12-04 MED ORDER — SODIUM CHLORIDE 0.9 % BOLUS (NEW BAG)
0.9 % | Freq: Once | INTRAVENOUS | Status: DC | PRN
Start: 2019-12-04 — End: 2019-12-09

## 2019-12-04 MED ORDER — HIC 2000025461 (MRNA-4157)
Freq: Once | INTRAMUSCULAR | Status: CP
Start: 2019-12-04 — End: ?
  Administered 2019-12-04: 17:00:00 1.000 mL via INTRAMUSCULAR

## 2019-12-04 MED ORDER — HIC 2000025461 PEMBROLIZUMAB (MK-3475)
Freq: Once | INTRAVENOUS | Status: CP
Start: 2019-12-04 — End: ?
  Administered 2019-12-04: 17:00:00 100.000 mL/h via INTRAVENOUS

## 2019-12-04 NOTE — Progress Notes
NAME:  Jason Rowland (1945/09/10)AGE: 74 y.o. MRN:  [id number] PROVIDER: Drema Halon, MD, PhDDATE OF SERVICE: 12/04/2019 [facility] [place]  REASON FOR VISIT:  C11D1 of HIC 25461ONC DX:  Invasive melanoma right mid back, stage IIIC (pT4a N3 M0); 30 mm with microsatellitosis, mitotic rate 2 per mm2.0% tumor and immune cell PD-L1 staining50-gene panel:  DNA VARIANT DETECTED ? ? 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 ? 74% TREATMENT HX:- Adjuvant Moderna clinical trial (?A Phase 2 Randomized Study of Adjuvant Immunotherapy with the Personalized Cancer Vaccine [id number] and Pembrolizumab Versus Pembrolizumab Alone After Complete Resection of High-Risk Melanoma? Protocol [id number], HIC 95638) C1D1 05/08/19.  Randomized to the pembrolizumab and vaccine arm. Onc Hx: [name] (prefers to be called Jason Rowland) is a 74 y.o. male who worked for 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  He reports it appeared approx 10 months prior; his PCP originally thought it was a pimple.  Dr. Lorn Junes at [facility] later evaluated it and also thought it was a pimple and injected it with intralesional kenalog x 2 and oral cephalexin without improvement.  The lesion increased in size and burned whenever he leaned against anything.  Denies night sweats and weight loss.  Lucie Leather did a I&D on 01/07/19  with only slight bloody discharge, so a punch biopsy was obtain with path showing metastatic melanoma.  On 01/20/19, additional biopsies were done to try to evaluate a primary site, but we do not have this tissue.  Per patient report, these biopsies were negative. MRI brain 01/30/19 without intracranial disease.  PET done 02/07/19 showing preepiglottic FDG activity s/p ENT evaluation which was negative for any visible malignancy.  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 LDH elevated to 251 at diagnosis. His case was presented at the [facility] tumor board on 02/13/19 and consensus was to ask ENT for random biopsies of the BOT and pre-epiglottic PET-avid areas, as we do not want to miss a head and neck cancer but otherwise not felt to be related to his melanoma.  The cervical LN were not thought to be related.  He should also have gastroenterology evaluation for a PET-avid stranding lesion in the transverse colon.  Dr. Landis Gandy (ENT) performed BOT and vallecula random biopsies 02/20/19 which only showed chronic inflammation and no malignancy.  He had a colonoscopy by Dr. Linton Flemings on 02/27/19 showing only a tubular adenoma in the sigmoid colon.  Overall, it was felt that his right mid back lesion is the site of his primary disease, and the metastatic melanoma pathology interpretation may have been complicated due to repeat I&Ds and steroid injections that may have altered the architecture of the primary lesion.  In the absence of any confirmed metastatic disease, he underwent WLE and LND with Dr. Duanne Moron on 03/03/19.  Final path showed a 30 mm thick melanoma, 2 mitoses/mm2, non-brisk TILs, no tumor regression, LVI+, microsatellites+, negative margins, and 3/35 lymph nodes (largest LN 4.5 x 3 cm) were positive for melanoma with presence of lymphatic invasion without extranodal extension.He uses a cane sometimes as he has some balance issues due to neuropathy in his feet.  Otherwise, he is fully functional, active, and independent in ADLs/iADLs.  No hx of autoimmune disease.03/27/19 signed consent for the Moderna trial; randomized to [facility]  and vaccine arm.  Started treatment C1D1 05/08/19.04/24/19 noted to have wound dehiscence, non-infected, healing by secondary intent.  Re-instated VNA services to assist with dressing changes and packing. Wound completely healed by 06/20/19.Surveillance scans 10/27/19 NED.Interval WU:XLKG returns for follow-up today and C11 of his clinical trial.  He will get pembrolizumab + his personalized vaccine today.  Today is the last time he will receive the vaccine.Since his last treatment 3 weeks ago, he notes the following?issues:?GI:-Diarrhea: 2 days of diarrhea (2 episodes/day) without blood. -taste changes/loss of smell and appetite (06/22/19) resolved 08/21/19 back to baseline.  Has some recurrence of sx since (09/25/19) -- still improved from March; now resolved to baseline.-ongoing occasional gas pains Gr1 with sensation of bloating, intermittent relieved with flatus?Resp:-DOE with walking long distances about a quarter of a mile; stable if not improved (pre-dated tx on trial)-no cough?Extremities:- local muscle injection site pain?Gr1?achy pain x2 days post injection- lower extremity swelling is?stable from his chronic edema?Metabolic-?long-acting?insulin dose?50 units qhs.  Now fasting 300s on average for glucose.  Thinks he has gained weight, but weight today shows it has been stable.- fatigue?(Gr1)?since 3/11 which is improved with swimming and walking. ?Has difficulty going to sleep, stable; still taking his chronic ambien.  Sometimes up until 3 AM.Denies any fever. ?He has completed his COVID vaccinations.  ECOG:  1Pain: reported as 0 today (does have hx of chronic neuropathy pain)Review of Systems:No fevers, chills, night sweats, lightheadedness, HA, CP, palpitations, wheezing, abd pain, constipation, blood in the urine or stools, dysuria.  Diabetic neuropathy in the hands and feet stable.  He has a prior hx of intermittent loose stools, chronic, without blood depending on his diet (often isolated to a specific restaurant he likes to visit with his buddies).  Positive responses on ROS as noted in interval hx.PAST MEDICAL HISTORY:Past Medical History: Diagnosis Date ? Diabetes mellitus (HC Code)  ? High cholesterol  ? Malignant melanoma of torso excluding breast (HC Code)  SURGICAL HISTORY:Past Surgical History: Procedure Laterality Date ? COLONOSCOPY   ? CORONARY ANGIOPLASTY WITH STENT PLACEMENT Bilateral  ? laryngoscopy with biopsy  02/20/2019 ? ROTATOR CUFF REPAIR Left 1999 ? TONSILLECTOMY    age 20 ? TOTAL HIP ARTHROPLASTY Right 2002 FAMILY HISTORY:Family History Problem Relation Age of Onset ? Heart disease Mother  ? Bladder cancer Father 63 ? Kidney cancer Sister  ? Leukemia Brother 34 ? No Known Problems Daughter  ? No Known Problems Son  SOCIAL HISTORY:Social History Socioeconomic History ? Marital status: Divorced   Spouse name: Not on file ? Number of children: Not on file ? Years of education: Not on file ? Highest education level: Not on file Occupational History ? Not on file Tobacco Use ? Smoking status: Never Smoker ? Smokeless tobacco: Never Used Vaping Use ? Vaping Use: Never used Substance and Sexual Activity ? Alcohol use: Not Currently ? Drug use: No ? Sexual activity: Not on file Other Topics Concern ? Not on file Social History Narrative  Divorced, works for [employer] in [place] will be retiring 03/17/19.  Has 1 son in [place] and 1 daughter in [place]. Llives in [place]  Social Determinants of Health Financial Resource Strain:  ? Difficulty of Paying Living Expenses:  Food Insecurity:  ? Worried About Programme researcher, broadcasting/film/video in the Last Year:  ? Barista in the Last Year:  Transportation Needs:  ? Freight forwarder (Medical):  ? Lack of Transportation (Non-Medical):  Physical Activity:  ? Days of Exercise per Week:  ?  Minutes of Exercise per Session:  Stress:  ? Feeling of Stress :  Social Connections:  ? Frequency of Communication with Friends and Family:  ? Frequency of Social Gatherings with Friends and Family:  ? Attends Religious Services:  ? Active Member of Clubs or Organizations:  ? Attends Banker Meetings:  ? Marital Status:  Intimate Partner Violence:  ? Fear of Current or Ex-Partner:  ? Emotionally Abused:  ? Physically Abused:  ? Sexually Abused:  ALLERGIES:No Known AllergiesMEDICATIONS:Current Outpatient Medications Medication Sig ? acetaminophen (TYLENOL) 325 mg tablet Take 3 tablets (975 mg total) by mouth every 6 (six) hours as needed (mild to moderate pain). ? ALPRAZolam (XANAX) 0.5 mg tablet 0.5 mg.  ? aspirin 81 MG EC tablet Take 81 mg by mouth nightly.  ? bedside commode 3 in 1 commode (Patient not taking: Reported on 03/07/2019) ? docusate sodium (COLACE) 250 mg capsule Take 1 capsule (250 mg total) by mouth 2 (two) times daily as needed for constipation. (Patient not taking: Reported on 08/21/2019) ? FREESTYLE LIBRE 14 DAY sensor kit 1 each by Other route every 14 (fourteen) days. ? glipiZIDE (GLUCOTROL XL) 10 MG 24 hr tablet Take 10 mg by mouth nightly.  ? ibuprofen (ADVIL,MOTRIN) 200 mg tablet Take 2 tablets (400 mg total) by mouth every 6 (six) hours as needed (alternate with tylenol for mild to moderate pain). ? olopatadine (PATANOL) 0.1 % ophthalmic solution as needed (takes for eye irritation as needed).  ? simvastatin (ZOCOR) 20 MG tablet Take 20 mg by mouth nightly.  ? TOUJEO SOLOSTAR U-300 300 unit/mL (1.5 mL) pen Inject 30 Units under the skin nightly. ? walker Misc Use as directed. ? zolpidem (AMBIEN) 10 mg tablet nightly.. No current facility-administered medications for this visit. VITALS: BP (!) (P) 154/81 (Site: r a, Position: Sitting, Cuff Size: Large)  - Pulse (P) 87  - Temp (P) 98 ?F (36.7 ?C) (Temporal)  - Resp (P) 20  - Wt (P) 118.5 kg  - SpO2 (P) 96%  - BMI (P) 37.32 kg/m?  PHYSICAL EXAM:Gen:  NAD, pleasant HEENT:  EOMI, PEARLLN:  No cervical, supraclavicular, axillary, or inguinal LAD.  CV:  RRR, no m/r/g Pulm:  CTA b/lAbd:  Soft, no tenderness, no guarding or rebound, no organomegaly, no ascitesExt:  WWP, 1+ symmetric edema in the ankles.  Lymphedema present in the right arm, unchanged.Neuro:   Grossly intactPsych:  Normal mood and affectSkin:  Scar on the right upper back well healed. Small lateral skin puckering at the edges of the scar stable.  Some flaking skin/dryness in the middle portion of the scar.  Right axillary scar well healed without concerning nodularity.  LABS:Results for orders placed or performed during the hospital encounter of 12/04/19 PT/INR and PTT     (BH GH L YH) Result Value Ref Range  Prothrombin Time 11.1 9.9 - 11.5 seconds  INR 1.04 0.92 - 1.08  PTT 24.6 22.2 - 29.6 seconds CK     (BH GH L LMW YH) Result Value Ref Range  Total CK 112 11 - 204 U/L Lactate dehydrogenase Result Value Ref Range  LD 201 122 - 241 U/L Phosphorus     (BH GH L LMW YH) Result Value Ref Range  Phosphorus 3.3 2.2 - 4.5 mg/dL MAGNESIUM Result Value Ref Range  Magnesium 1.6 (L) 1.7 - 2.4 mg/dL Comprehensive metabolic panel Result Value Ref Range  Sodium 137 136 - 144 mmol/L  Potassium 4.3 3.3 - 5.1 mmol/L  Chloride 99 98 -  107 mmol/L  CO2 27 20 - 30 mmol/L  Anion Gap 11 7 - 17  Glucose 220 (H) 70 - 100 mg/dL  BUN 22 8 - 23 mg/dL  Creatinine 9.51 8.84 - 1.30 mg/dL  Calcium 9.0 8.8 - 16.6 mg/dL  BUN/Creatinine Ratio 06.3 (H) 8.0 - 23.0  Total Protein 7.4 6.6 - 8.7 g/dL  Albumin 4.0 3.6 - 4.9 g/dL  Total Bilirubin 0.4 <=0.1 mg/dL  Alkaline Phosphatase 74 9 - 122 U/L  Alanine Aminotransferase (ALT) 34 9 - 59 U/L  Aspartate Aminotransferase (AST) 34 10 - 35 U/L  Globulin 3.4 2.3 - 3.5 g/dL  A/G Ratio 1.2 1.0 - 2.2  AST/ALT Ratio 1.0 See Comment  eGFR (Afr Amer) >60 >60 mL/min/1.15m2  eGFR (NON African-American) >60 >60 mL/min/1.10m2 CBC auto differential Result Value Ref Range  WBC 8.6 4.0 - 10.0 x1000/?L  RBC 5.0 3.8 - 5.9 M/?L  Hemoglobin 14.3 12.0 - 18.0 g/dL  Hematocrit 60.1 09.3 - 52.0 %  MCV 86.7 78.0 - 94.0 fL  MCHC 33.2 31.0 - 36.0 g/dL  RDW-CV 23.5 57.3 - 22.0 %  Platelets 261 140 - 440 x1000/?L  MPV 10.4 6.0 - 11.0 fL  ANC (Abs Neutrophil Count) 5.8 1.0 - 11.0 x 1000/?L  Neutrophils 66.6 37.0 - 84.0 %  Lymphocytes 21.8 8.0 - 49.0 %  Absolute Lymphocyte Count 1.9 1.0 - 4.0 x 1000/?L  Monocytes 7.2 4.0 - 15.0 %  Monocyte Absolute Count 0.6 0.0 - 2.0 x 1000/?L  Eosinophils 3.4 0.0 - 7.0 %  Eosinophil Absolute Count 0.3 0.0 - 1.0 x 1000/?L  Basophil 0.7 0.0 - 4.0 %  Basophil Absolute Count 0.1 (H) 0.0 - 0.0 x 1000/?L  Immature Granulocytes 0.3 0.0 - 3.0 %  Absolute Immature Granulocyte Count 0.0 0.0 - 0.3 x 1000/?L  nRBC 0.0 0.0 - 1.0 %  Absolute nRBC 0.0 0.0 - 0.0 x 1000/?L  MCH 28.8 27.0 - 31.0 pg IMAGING/PATH:[place] abd/pelvis 10/27/19:  NEDCT chest 10/27/19:  NED; new small bilateral pleural effusions.  Stable 4 mm LLL nodule.MRI Brain and [place] CAP 04/17/19:  NEDPET 02/07/19:Head And Neck: Base of tongue and preepiglottic FDG activity is nonspecific (SUV max 6.6).Mildly FDG avid cervical lymph nodes are nonspecific, for example:*  Left level 2 (B[place]ster image 874, 1.4 x 0.8 cm, SUV max 2.6)*  Right level 2 (Pablo image 878, 1.4 x 1 cm, SUV max 2.1)CHEST:Hypermetabolic mass overlying the right posterior 10th rib is consistent with malignancy, and involves the skin, subcutaneous fat, extending to the latissimus dorsi muscle (SUV max 15, 6.3 x 3 x 5.9 cm).Two hypermetabolic right axillary lymph nodes are highly suspicious for metastases (up to SUV max 8.3, 2.4 x 1.7 cm). Multiple additional smaller right subpectoral lymph nodes are minimally FDG avid, and are indeterminate (SUV max 1.2, subcentimeter).Azygos pulmonary fissure is incidentally noted. No suspicious pulmonary nodule is identified, although pulmonary detail is partially obscured by breathing motion. Coronary arteries and the aortic valve are calcified. The heart is on the upper bound of normal size. Nonspecific pattern of myocardial FDG activity. No mediastinal lymphadenopathy is identified.Abdomen/Pelvis: Diffuse hepatic and splenic heterogeneity most likely represents artifactual image noise, however this could conceal any small lesions.Multiple colonic diverticuli show no evidence of diverticulitis. Diffuse patchy colonic FDG activity is nonspecific, commonly inflammatory or related to a pharmacologic effect, however this could conceal any small lesions (SUV max 9 the sigmoid). Left renal cortical defect associated with a punctuate calcification likely represents scarring. Right renal calyceal calcifications likely represent  nonobstructing stones (up to 0.5 cm).Normal appearance of the gallbladder, spleen, stomach, pancreas, adrenal glands, small bowel, decompressed urinary bladder.Streak artifacts arising from metallic right hip prostheses obscure nearby structures.Small retroperitoneal lymph nodes are not FDG avid, although below resolution of PET. Otherwise, no retroperitoneal, mesenteric, or pelvic lymphadenopathy is identified. Inguinal lymph nodes are nonspecific (SUV max 1.5), commonly inflammatory.MUSCULOSKELETAL:Heterogeneous marrow FDG activity is likely due to a combination of artifactual image noise, and marrow activation, however this reduces sensitivity for any small lesions (SUV max 4.6 in the sacrum). Right knee FDG activity associated with cruciate ligaments and synovium is likely degenerative or posttraumatic (SUV max 5), with small knee effusion (SUV max 3.3).IMPRESSION:1.  The accession number [id number] does not have any associated images in PACS at the time of dictation.  PET-Amado images associated with the accession number [id number] / [id number] were interpreted in this report.2.  Hypermetabolic mass overlying the right posterior 10th rib is consistent with malignancy, and involves the skin, subcutaneous fat, extending to the latissimus dorsi muscle.3.  Right axillary nodal metastases.4.  Multiple small right subpectoral lymph nodes are indeterminate; metastases are not excluded.5.  Base of tongue and preepiglottic FDG activity is nonspecific, with slight asymmetric fullness on the left. Direct visualization is recommended to exclude primary neoplasm.6.  Minimally FDG avid bilateral level 2 cervical lymph nodes are nonspecific, possibly inflammatory.MRI brain 01/30/19:There is no intracranial hemorrhage or major vascular distribution infarct.No abnormal enhancement is seen.There is no mass effect, edema, or midline shift.The ventricles are symmetric and normal in size. Several scattered foci of T2/FLAIR hyperintense signal are noted in the periventricular and subcortical white matter, likely sequela of chronic small vessel ischemic disease. No extra-axial collection is seen.Prior lens replacements noted bilaterally.The paranasal sinuses are clear. Small right mastoid effusion noted.IMPRESSION:No evidence of intracranial metastatic disease.WLE and SLN 03/03/19:LYMPH NODES, RIGHT AXILLARY LEVELS 1, 2 AND 3, LYMPH NODE DISSECTION: ?  ? ? - ?THREE OF THIRTY-FIVE LYMPH NODES, POSITIVE FOR MELANOMA (3 OF 35) WITH LARGEST INVOLVED LYMPH NODE MEASURING 4.5 X 3 CM ? ? ?- LYMPHATIC INVASION IDENTIFIED ? ? ?- NO EXTRANODAL EXTENSION IDENTIFIED 1. ?SKIN, BACK, RIGHT UPPER, EXCISION: ? ? ? ?- MALIGNANT MELANOMA, SEE NOTE AND SYNOPTIC SUMMARY Note: Sections show a tumor composed of S100 positive epithelioid cells within the dermis and subcutaneous tissue. Cytokeratin AE1/AE3 and desmin are negative. A junctional component is not identified. The findings would be compatible with a primary dermal melanoma or a metastatic lesion. Clinical correlation is suggested. 2. ?SKIN, BACK, EXCISION: ? ? ? ?- BENIGN FIBROADIPOSE TISSUE AND SKELETAL MUSCLE SYNOPTIC SUMMARY MELANOMA OF THE SKIN Tumor Site: ? ? Back Laterality: ? ? Right Procedure: ? ? Primary excision Maximum Tumor Thickness (Depth): ? ? 30 mm Ulceration: ? ? Not identified Mitotic Rate (Mitoses/mm2): ? ? 2 Anatomic Level: ? ? V (Melanoma invades subcutis) Growth Phase: ? ? Vertical Histologic Type: ? ? Melanoma, NOS Tumor-Infiltrating Lymphocytes: ? ? Present, non-brisk Tumor Regression: ? ? Not identifed Lymphovascular Invasion: ? ? Present Microsatellitosis: ? ? Present Margins ? ? ?Peripheral Margins: ? ? Uninvolved Deep Margin: ? ? Uninvolved Stage (AJCC 8th Ed): ? ? pT4a Nx Base of the tongue and vallecula random biopsies 02/20/19: 1. ?TONGUE, LEFT, BASE, BIOPSY: ? ? ? ?- LYMPHOID TISSUE HYPERPLASIA ? ? ?- NEGATIVE FOR MALIGNANCY 2. ?THROAT, VALLECULA, BIOPSY: ? ?  ? - LYMPHOID TISSUE HYPERPLASIA AND FOCAL CHRONIC INFLAMMATION. SEE NOTE. ? ? ?- NEGATIVE FOR MALIGNANCY ? ? ? ? ? Note: Immunostain of Sox-10  to investigate focal pigment is negative, supporting above interpretation. 3. ?THROAT, LEFT VALLECULA, BIOPSY: ? ? ? ?- LYMPHOID TISSUE HYPERPLASIA ? ? ?- NEGATIVE FOR MALIGNANCY 4. ?THROAT, RIGHT VALLECULA, BIOPSY: ? ? ? ? ? ? - LYMPHOID TISSUE HYPERPLASIA AND FOCAL CHRONIC INFLAMMATION ? ? ?- NEGATIVE FOR MALIGNANCY 5. ?TONGUE, RIGHT BASE, BIOPSY: ? ? ? ? ? ? - LYMPHOID TISSUE HYPERPLASIA AND FOCAL CHRONIC INFLAMMATION ? ? ?- NEGATIVE FOR MALIGNANCY Pathology (reviewed at [facility]), collected 01/07/19:DIAGNOSIS: ? 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 Clinical pathological correlation is recommended. MICROSCOPIC DESCRIPTION: There are atypical cells in a nodule in the dermis. The cells are positive with SOX-10 and by report are negative with LCA, Kappa, Lambda, and a cytokeratin. The cells are also positive with S-100 by report.ASSESSMENT/PLAN:[name] is a 74 y.o. 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  Initially path read as metastatic melanoma, but further workup confirms that this is the primary site, not a metastatic lesion.  Initial biopsy was likely altered due to repeat I&Ds of the area.  He had a FBSE with his dermatologist and no additional primary lesions were identified.  He is otherwise in good health except for metabolic syndrome and diabetes-associated neuropathy.  PET 02/07/19 showed the main lesion on the right upper back extending to the latissimus dorsi muscle with involved right axillary nodes and nonspecific avidity in the BOT and preepiglottis for which direct visual evaluation was recommended and done 02/11/19 and random biopsies 02/20/19 without concerns for malignancy. Colonoscopy was also done to evaluate stranding in the transverse colon, which was negative for malignancy on exam.  Now s/p WLE and LND with Dr. Duanne Moron on 03/03/19 showing a stage IIIC melanoma that was 30 mm thick, positive for LVI and microsatellites with 3/25 positive LNs.  Given the thickness of his primary melanoma and microsatellitosis, we felt that his melanoma-specific survival is closer to a stage IIID:  60% over 10 years with stage IIIC disease versus 24% with stage IIID.  He does not have a BRAF mutation, so SOC options are immune therapy at this point versus the Moderna clinical trial (?A Phase 2 Randomized Study of Adjuvant Immunotherapy with the Personalized Cancer Vaccine [phone] and Pembrolizumab Versus Pembrolizumab Alone After Complete Resection of High-Risk Melanoma?) vs active surveillance (for which he would be extremely high risk for relapsing).  He opted for the Moderna trial and signed consent on 03/27/19; tissue passed QC inspection.  Jason Rowland was randomized to the [facility] with vaccine arm of the trial.  He has experienced some weight loss due to loss of taste/smell/appetite after initiation the clinical trial, which is now resolved with weight gain. He has stable right arm lymphedema but declines any lymphedema therapy or sleeves.  He also had some G2 diarrhea after C4, which resolved with Imodium and not requiring anti-diarrheals by C7.  A few isolated episodes of diarrhea x 2 days since his last treatment, now fully resolved.  Energy is improved after starting an exercise regimen.He had some issues with his bills, some of which were sent to collection.  Britta Mccreedy helped to clarify what is and is not covered by the trial.  All drugs should be covered including every other set of scans.- labs reviewed today.  LDH wnl. LFTs wnl.  Bilis are normal.  TSH still  pending.  Ok for treatment C11 today. Will get pembrolizumab with vaccine. - [place] CAP 7/12 NED; surveillance scans every 12 weeks.  - routine f/u with dermatologist every 4-6 months.  Last seen 11/11/19 with one biopsy taken from the left superior scapula.- RTC in 3 weeks per trial schedule; instructed to call if any issues or questions in the interim30 min were spent in direct patient contact.  He has our clinic contact information and instructed to call if questions.  Drema Halon, MD, PhDMedical Oncology

## 2019-12-04 NOTE — Progress Notes
Day 1 Cycle 11Pt. ambulated to department alert and oriented x 4 in no obvious distress.Complains of nausea and bloating. Denies vomiting, diarrhea or constipation.PIV access gained to right hand with 24 G cannula. Bloods drawn and sent to lab.Pt. seen by provider, lab results reviewed and patient cleared for treatment.HIC [id number] ([id number]) investigational study drug 1 mg ( 0.5 mg/mL x 2 injections) given in left deltoid muscle as ordered.No adverse reaction noted. No redness or swelling noted to arm. Bandaid applied to injection sites.HIC [id number] pembrolizumab ([id number]) 200 mg in 100 mL of 0.9% sodium chloride infused over 30 minutes.No adverse reaction noted.IV flushed and positive blood return noted.PIV access removed. Gauze and coban applied.Pt. refused AVS. Will use my chart.Pt. left unit in stable condition.Follow-up appointment 12/25/2019.

## 2019-12-08 NOTE — Progress Notes
HIC #???[id number]???Cycle???11, Day 1???Patient seen and examined by???Dr. Romona Curls and toxicity assessment reviewed???by???Dr. TranAll labs are deemed NCS today, Concomitant medications reviewed and updated.Approved for treatment???for pembro and vaccine dose???today per??????Dr. Laveda Norman.???Last dose vaccine today per protocol???ECOG PS =???1??????Concomitant medications:???MEDICATION SINGLE DOSE UNITS ROUTE FREQUECY START DATE STOP DATE INDICATION Aspirin 81 mg PO QD 2010 ongoing PPX CVD Simvastatin 20 Mg PO QD 2012 ongoing Hypercholesterolemia ???Ambien 10 mg PO QD 2016 ongoing insomnia ???Glucoside 10 mg PO QD 2014 ongoing diabetes ???Toujeo Solostar ???80 Units SQ QD ???2016 ???07/08/19 diabetes ???guaifenesin 400 mg PO q 4 hours prn 07/10/19 08/16/19 cough ???imodium 2 Mg PO PRN 07/19/19 ongoing diarrhea Toujeo Solostar 40-50 units subcutaneous QD 07/09/19 10/01/19 diabetes ???Toujeo Solostar 60 units SQ QD 10/02/19 ???ongoing diabetes ??? ??? ??? ??? ??? ??? ??? ??? ??? ??? ??? ??? ??? ??? ??? ??? ??????Medical History:?????? ??? ??? ??? ??? ??? ??? ??? ??? ??? ??? ??? ??? ??? ??? ??? ??? ??? ??? ??? ??? ??? ??? ??? ??? ??? ??? ??? PATIENT NAME:???Jason Rowland??????STUDY ID: ???[id number] ??? ??? 0=NA ???????????????????????????????????????????????????????????????1= Unrelated ????????????????????????2= Unlikely 3=Possible ????????????????????????4= Probable ??????????????????????????????5. Definite (Circle One) 0= No Action Taken ????????????????????????????????????????????????????????????????????????????????????????????????????????????????????????1= Con Med ?????????????????????????????????????????????????????????????????????????????????????????????????????????????????????????????????????????????????????????????????????????????????2=Dose Modified ?????????????????????????????????????????????????????????????????????????????????????????????????????????????????????3=Dose Delayed ????????????????????????????????????????????????????????????????????????????????????????????????????????????????????????????????????????????????????????????????????????4= Patient Hospitalized ???????????????????????????????????????????????????????????????????????????????????????????????????????????????5= Patient Taken Off Study ?????????????????????????????????????????????????????????6=Non Drug Therapy ?????????????????????????????????????????????????????????????????????????????????????????????????????????????????????7=Other (Specify) 1=Recovered ??????????????????????????????????????????????????????????????????2=Still under txmt/observation ???????????????????????????????????????????????????????????????????????????????????????3=Alive w/Sequelae ????????????????????????????????????????????????????????????????????????????????????????????????????????????????????????????????????????????????????????????????????????????????????????????????4=Died ????????????????????????????????????????????????????????????????????????5=Resolvd to Baseline ????????????????????????????????????6=Grade Change ??? ??? ??? Medical History ??? ??? ??? ??? ??? ??? ??? ??? ??? ??? ??? ADVERSE EVENT  Is AE Intermittent? Y/N SAE ????????????????????????????????????Y/N Grade Expect???PEMBRO???Y/N ?Relation to???PEMBRO Expect???mRNA????????????????????????????????????Y/N ?Relation to???mRNA Start Date End Date/ or ?Ongoing Action Taken ??????????????????????????????????????????(Select # above) AE OUTCOME ??? Hypercholesterolemia N N 1 Med Hx Med Hx Med Hx Med Hx 2012 ongoing 1=???simvastatin 2 ??? diabetes N N 1 Med Hx Med Hx Med Hx Med Hx 2010 ongoing 1-???glucoside; toujeo 2 ??? Insomnia N N 1 Med Hx Med Hx Med Hx Med Hx 2016 ongoing 1- ambien 2 ??? Edema???limbs N N 1 Med Hx??? Med Hx Med Hx Med Hx ???03/03/2019 ongoing 0 2 ??? Diarrhea??? N N 1 Y??? 1 ???N/A ???N/A ???05/27/2019 05/27/2019 0 5 ??? fatigue??? ???N N??? 1??? ???Y 3??? ???Y 3??? ???06/26/19 ???ongoing ???0 2??? ??? dysgeusia N N 1 N 2 N 2 06/22/19 08/21/19 0 2 ??? anorexia N N 1 Y 2 Y 2 06/22/19 08/21/19 0 2 ??? Pain, injection site Y N 1 N 1 Y 4 06/19/19 06/23/19 0 2 ??? Eye disorders, other (left eye droop) N N 1 N 2 N 2 07/01/19 07/31/19 0 2 ??? vomiting N N 1 Y 3 Y 3 07/08/19 07/08/19 0 1 ??? dirrahea Y N 1 Y 3 Y 3 07/08/19 07/08/19 0 2 ??? cough N N 1 Y 3 Y 3 07/08/19 08/16/19 1 = gueifenisen 2 ??? ALT increased N N 1 Y 3 Y 3 07/10/19 07/31/19 0 2 ??? AST increased  N N 1 Y 3 Y 3 07/10/19 07/31/19 0 2 ??? Alkaline phos increased N N 2 Y 3 Y 3 07/10/19 07/31/19 0 2 ??? Diarrhea??? Y N 2 Y 3 Y 3 07/09/19 07/20/2019 1- imodium 1 ??? Injection site pain Y N 1 N 1 Y 3 07/08/19 ongoing 0 2 ??? bloating Y N 1 N 1 N 1 07/08/19 ongoing 0 2 ??? Bilateral foot neuropathy N N 1 N/A N/A N/A N/A Baseline 02/2019 ongoing 0 2 ??? Dyspnea on exertion Y N 1 N/A N/A N/A N/A Baseline 02/2019 ongoing 0 2 ??? Diarrhea??? N N 1 Y 1 Y 1 09/25/19??? 09/25/19 1-  imodium 1 ??? Anorexia??? N N 1 Y 2 Y 2 10/02/19  11/13/19 0 2 ??? Diarrhea??? N N 1 Y 1 Y 1 10/16/19??? 10/17/19 0 1 ??? diarrhea??? N N 1 Y 1 Y 1 11/14/19??? 11/15/19 0 1 ??? bloating Y N 1 N 1 N 1 12/02/19 ??? 0 2 ??? ??? ??? ??? ??? ??? ??? ??? ??? ??? ??? ??? ??? ??? ??? ??? ??? ??? ??? ??? ??? ??? ??? ??? ??? ??? ??? ??? ??? ??? ??? ??? ??? ??? ??? ??? ??? ??? ??? ??? ??? ??? ??? ??? ??? ??? ??? ??? ??? ??? ??? ??? ??? ??? ???Labs:Results for Jason Rowland, Jason Rowland (MRN [id number]) Ref. Range 12/04/2019 10:30 Sodium Latest Ref Range: 136 - 144 mmol/L 137 Potassium Latest Ref Range: 3.3 - 5.1 mmol/L 4.3 Chloride Latest Ref Range: 98 - 107 mmol/L 99 CO2 Latest Ref Range: 20 - 30 mmol/L 27 Anion Gap Latest Ref Range: 7 - 17  11 BUN Latest Ref Range: 8 - 23 mg/dL 22 Creatinine Latest Ref Range: 0.40 - 1.30 mg/dL 4.09 BUN/Creatinine Ratio Latest Ref Range: 8.0 - 23.0  30.1 (H) eGFR (NON African-American) Latest Ref Range: >60 mL/min/1.35m2 >60 eGFR (Afr Amer) Latest Ref Range: >60 mL/min/1.53m2 >60 Glucose Latest Ref Range: 70 - 100 mg/dL 811 (H) Calcium Latest Ref Range: 8.8 - 10.2 mg/dL 9.0 Magnesium Latest Ref Range: 1.7 - 2.4 mg/dL 1.6 (L) Phosphorus Latest Ref Range: 2.2 - 4.5 mg/dL 3.3 Total Bilirubin Latest Ref Range: <=1.2 mg/dL 0.4 Alkaline Phosphatase Latest Ref Range: 9 - 122 U/L 74 Alanine Aminotransferase (ALT) Latest Ref Range: 9 - 59 U/L 34 Aspartate Aminotransferase (AST) Latest Ref Range: 10 - 35 U/L 34 AST/ALT Ratio Latest Ref Range: See Comment  1.0 LD Latest Ref Range: 122 - 241 U/L 201 Total Protein Latest Ref Range: 6.6 - 8.7 g/dL 7.4 Albumin Latest Ref Range: 3.6 - 4.9 g/dL 4.0 Globulin Latest Ref Range: 2.3 - 3.5 g/dL 3.4 A/G Ratio Latest Ref Range: 1.0 - 2.2  1.2 Total CK Latest Ref Range: 11 - 204 U/L 112 T3, Total Latest Ref Range: See Comment ng/dL 91.4 Thyroid Stimulating Hormone, 3rd Gen. Latest Ref Range: See Comment ???IU/mL 4.380 (H) Free T4 Latest Ref Range: See Comment ng/dL 7.82 WBC Latest Ref Range: 4.0 - 10.0 x1000/???L 8.6 RBC Latest Ref Range: 3.8 - 5.9 M/???L 5.0 Hemoglobin Latest Ref Range: 12.0 - 18.0 g/dL 95.6 Hematocrit Latest Ref Range: 37.0 - 52.0 % 43.1 MCV Latest Ref Range: 78.0 - 94.0 fL 86.7 MCH Latest Ref Range: 27.0 - 31.0 pg 28.8 MCHC Latest Ref Range: 31.0 - 36.0 g/dL 21.3 RDW-CV Latest Ref Range: 11.5 - 14.5 % 14.3 nRBC Latest Ref Range: 0.0 - 1.0 % 0.0 Platelets Latest Ref Range: 140 - 440 x1000/???L 261 MPV Latest Ref Range: 6.0 - 11.0 fL 10.4 Neutrophils Latest Ref Range: 37.0 - 84.0 % 66.6 Lymphocytes Latest Ref Range: 8.0 - 49.0 % 21.8 Monocytes Latest Ref Range: 4.0 - 15.0 % 7.2 Eosinophils Latest Ref Range: 0.0 - 7.0 % 3.4 Basophils Latest Ref Range: 0.0 - 4.0 % 0.7 ANC (Abs Neutrophil Count) Latest Ref Range: 1.0 - 11.0 x 1000/???L 5.8 Absolute Lymphocyte Count Latest Ref Range: 1.0 - 4.0 x 1000/???L 1.9 Monocytes (Absolute) Latest Ref Range: 0.0 - 2.0 x 1000/???L 0.6 Eosinophil Absolute Count Latest Ref Range: 0.0 - 1.0 x 1000/???L 0.3 Basophils Absolute Latest Ref Range: 0.0 - 0.0 x 1000/???L 0.1 (H) Immature Granulocytes (Abs) Latest Ref Range: 0.0 - 0.3  x 1000/???L 0.0 nRBC Absolute Latest Ref Range: 0.0 - 0.0 x 1000/???L 0.0 Immature Granulocytes Latest Ref Range: 0.0 - 3.0 % 0.3 Prothrombin Time Latest Ref Range: 9.9 - 11.5 seconds 11.1 INR Latest Ref Range: 0.92 - 1.08  1.04 PTT Latest Ref Range: 22.2 - 29.6 seconds 24.6

## 2019-12-23 ENCOUNTER — Telehealth: Admit: 2019-12-23 | Payer: PRIVATE HEALTH INSURANCE

## 2019-12-23 ENCOUNTER — Encounter: Admit: 2019-12-23 | Payer: PRIVATE HEALTH INSURANCE

## 2019-12-23 DIAGNOSIS — L97529 Non-pressure chronic ulcer of other part of left foot with unspecified severity: Secondary | ICD-10-CM

## 2019-12-23 NOTE — Telephone Encounter
Talked with Jason Rowland regarding setting up scan appointment and moving Dr. Laveda Norman appt from [facility] 9/30 to Tues 9/28.  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  Will call me back this afternoon as he was getting ready to leave.  Gave my number [phone].  Asked him to leave a voice mail and then I will call him right back.  [name] CRA

## 2019-12-24 ENCOUNTER — Telehealth: Admit: 2019-12-24 | Payer: PRIVATE HEALTH INSURANCE

## 2019-12-24 ENCOUNTER — Inpatient Hospital Stay: Admit: 2019-12-24 | Discharge: 2019-12-24 | Payer: BLUE CROSS/BLUE SHIELD

## 2019-12-24 DIAGNOSIS — L97529 Non-pressure chronic ulcer of other part of left foot with unspecified severity: Secondary | ICD-10-CM

## 2019-12-25 ENCOUNTER — Encounter: Admit: 2019-12-25 | Payer: PRIVATE HEALTH INSURANCE

## 2019-12-25 ENCOUNTER — Inpatient Hospital Stay: Admit: 2019-12-25 | Discharge: 2019-12-25 | Payer: BLUE CROSS/BLUE SHIELD

## 2019-12-25 ENCOUNTER — Ambulatory Visit: Admit: 2019-12-25 | Payer: BLUE CROSS/BLUE SHIELD

## 2019-12-25 DIAGNOSIS — Z96641 Presence of right artificial hip joint: Secondary | ICD-10-CM

## 2019-12-25 DIAGNOSIS — Z791 Long term (current) use of non-steroidal anti-inflammatories (NSAID): Secondary | ICD-10-CM

## 2019-12-25 DIAGNOSIS — Z79899 Other long term (current) drug therapy: Secondary | ICD-10-CM

## 2019-12-25 DIAGNOSIS — Z8052 Family history of malignant neoplasm of bladder: Secondary | ICD-10-CM

## 2019-12-25 DIAGNOSIS — R141 Gas pain: Secondary | ICD-10-CM

## 2019-12-25 DIAGNOSIS — E1142 Type 2 diabetes mellitus with diabetic polyneuropathy: Secondary | ICD-10-CM

## 2019-12-25 DIAGNOSIS — Z7982 Long term (current) use of aspirin: Secondary | ICD-10-CM

## 2019-12-25 DIAGNOSIS — C4359 Malignant melanoma of other part of trunk: Secondary | ICD-10-CM

## 2019-12-25 DIAGNOSIS — R140 Abdominal distension (gaseous): Secondary | ICD-10-CM

## 2019-12-25 DIAGNOSIS — Z5112 Encounter for antineoplastic immunotherapy: Secondary | ICD-10-CM

## 2019-12-25 DIAGNOSIS — Z794 Long term (current) use of insulin: Secondary | ICD-10-CM

## 2019-12-25 DIAGNOSIS — R0609 Other forms of dyspnea: Secondary | ICD-10-CM

## 2019-12-25 DIAGNOSIS — Z85820 Personal history of malignant melanoma of skin: Secondary | ICD-10-CM

## 2019-12-25 DIAGNOSIS — Z596 Low income: Secondary | ICD-10-CM

## 2019-12-25 DIAGNOSIS — E785 Hyperlipidemia, unspecified: Secondary | ICD-10-CM

## 2019-12-25 DIAGNOSIS — M7989 Other specified soft tissue disorders: Secondary | ICD-10-CM

## 2019-12-25 DIAGNOSIS — Z8051 Family history of malignant neoplasm of kidney: Secondary | ICD-10-CM

## 2019-12-25 DIAGNOSIS — Z806 Family history of leukemia: Secondary | ICD-10-CM

## 2019-12-25 DIAGNOSIS — I890 Lymphedema, not elsewhere classified: Secondary | ICD-10-CM

## 2019-12-25 DIAGNOSIS — E11621 Type 2 diabetes mellitus with foot ulcer: Secondary | ICD-10-CM

## 2019-12-25 DIAGNOSIS — E7800 Pure hypercholesterolemia, unspecified: Secondary | ICD-10-CM

## 2019-12-25 DIAGNOSIS — Z8249 Family history of ischemic heart disease and other diseases of the circulatory system: Secondary | ICD-10-CM

## 2019-12-25 DIAGNOSIS — R609 Edema, unspecified: Secondary | ICD-10-CM

## 2019-12-25 DIAGNOSIS — Z006 Encounter for examination for normal comparison and control in clinical research program: Secondary | ICD-10-CM

## 2019-12-25 DIAGNOSIS — R5383 Other fatigue: Secondary | ICD-10-CM

## 2019-12-25 DIAGNOSIS — E119 Type 2 diabetes mellitus without complications: Secondary | ICD-10-CM

## 2019-12-25 DIAGNOSIS — Z955 Presence of coronary angioplasty implant and graft: Secondary | ICD-10-CM

## 2019-12-25 LAB — URINALYSIS-MACROSCOPIC W/REFLEX MICROSCOPIC
BKR BILIRUBIN, UA: NEGATIVE
BKR BLOOD, UA: NEGATIVE
BKR KETONES, UA: NEGATIVE
BKR LEUKOCYTE ESTERASE, UA: NEGATIVE
BKR NITRITE, UA: NEGATIVE
BKR PH, UA: 6 /LPF (ref 5.5–7.5)
BKR SPECIFIC GRAVITY, UA: 1.024 — ABNORMAL HIGH (ref 1.005–1.020)
BKR UROBILINOGEN, UA: 0.2 EU/dL (ref <=2.0)

## 2019-12-25 LAB — COMPREHENSIVE METABOLIC PANEL
BKR A/G RATIO: 1.1 % (ref 1.0–2.2)
BKR ALANINE AMINOTRANSFERASE (ALT): 23 U/L (ref 9–59)
BKR ALBUMIN: 4.1 g/dL (ref 3.6–4.9)
BKR ALKALINE PHOSPHATASE: 75 U/L (ref 9–122)
BKR ANION GAP: 11 (ref 7–17)
BKR ASPARTATE AMINOTRANSFERASE (AST): 21 U/L (ref 10–35)
BKR AST/ALT RATIO: 0.9 x 1000/??L (ref 0.0–1.0)
BKR BILIRUBIN TOTAL: 0.3 mg/dL (ref <=1.2)
BKR BLOOD UREA NITROGEN: 16 mg/dL (ref 8–23)
BKR BUN / CREAT RATIO: 20.3 x1000/??L (ref 8.0–23.0)
BKR CALCIUM: 9.6 mg/dL (ref 8.8–10.2)
BKR CHLORIDE: 100 mmol/L (ref 98–107)
BKR CO2: 27 mmol/L (ref 20–30)
BKR CREATININE: 0.79 mg/dL (ref 0.40–1.30)
BKR EGFR (NON AFRICAN AMERICAN): >60 mL/min/{1.73_m2} — ABNORMAL HIGH (ref >60)
BKR GLOBULIN: 3.7 g/dL — ABNORMAL HIGH (ref 2.3–3.5)
BKR GLUCOSE: 246 mg/dL — ABNORMAL HIGH (ref 70–100)
BKR POTASSIUM: 4.6 mmol/L (ref 3.3–5.1)
BKR PROTEIN TOTAL: 7.8 g/dL (ref 6.6–8.7)
BKR SODIUM: 138 mmol/L (ref 136–144)

## 2019-12-25 LAB — CBC WITH AUTO DIFFERENTIAL
BKR EGFR (AFR AMER): 0.8 % (ref 0.0–4.0)
BKR WAM ABSOLUTE IMMATURE GRANULOCYTES: 0 x 1000/??L (ref 0.0–0.3)
BKR WAM ABSOLUTE LYMPHOCYTE COUNT: 1.4 x 1000/ÂµL (ref 1.0–4.0)
BKR WAM ABSOLUTE NRBC: 0 x 1000/??L (ref 0.0–0.0)
BKR WAM ANALYZER ANC: 6 x 1000/ÂµL (ref 1.0–11.0)
BKR WAM BASOPHIL ABSOLUTE COUNT: 0.1 x 1000/ÂµL — ABNORMAL HIGH (ref 0.0–0.0)
BKR WAM BASOPHILS: 0.8 % (ref 0.0–4.0)
BKR WAM EOSINOPHIL ABSOLUTE COUNT: 0.2 x 1000/ÂµL (ref 0.0–1.0)
BKR WAM EOSINOPHILS: 2.5 % (ref 0.0–7.0)
BKR WAM HEMATOCRIT: 43.4 % — ABNORMAL HIGH (ref 37.0–52.0)
BKR WAM HEMOGLOBIN: 14.5 g/dL (ref 12.0–18.0)
BKR WAM IMMATURE GRANULOCYTES: 0.2 % (ref 0.0–3.0)
BKR WAM LYMPHOCYTES: 16.7 % (ref 8.0–49.0)
BKR WAM MCH (PG): 29.5 pg (ref 27.0–31.0)
BKR WAM MCHC: 33.4 g/dL (ref 31.0–36.0)
BKR WAM MCV: 88.2 fL (ref 78.0–94.0)
BKR WAM MONOCYTE ABSOLUTE COUNT: 0.7 x 1000/??L — ABNORMAL HIGH (ref 0.0–2.0)
BKR WAM MONOCYTES: 8 % (ref 4.0–15.0)
BKR WAM MPV: 10.7 fL (ref 6.0–11.0)
BKR WAM NEUTROPHILS: 71.8 % (ref 37.0–84.0)
BKR WAM NUCLEATED RED BLOOD CELLS: 0 % (ref 0.0–1.0)
BKR WAM PLATELETS: 259 x1000/ÂµL (ref 140–440)
BKR WAM RDW-CV: 13.7 % (ref 11.5–14.5)
BKR WAM RED BLOOD CELL COUNT: 4.9 M/??L (ref 3.8–5.9)
BKR WAM WHITE BLOOD CELL COUNT: 8.3 x1000/ÂµL (ref 4.0–10.0)

## 2019-12-25 LAB — URINE MICROSCOPIC     (BH GH LMW YH)

## 2019-12-25 LAB — CK     (BH GH L LMW YH): BKR CREATINE KINASE TOTAL: 96 U/L (ref 11–204)

## 2019-12-25 LAB — MAGNESIUM: BKR MAGNESIUM: 1.7 mg/dL (ref 1.7–2.4)

## 2019-12-25 LAB — PT/INR AND PTT (BH GH L LMW YH)
BKR INR: 0.98 (ref 0.92–1.08)
BKR PARTIAL THROMBOPLASTIN TIME: 24.4 seconds (ref 22.7–28.9)
BKR PROTHROMBIN TIME: 10.4 seconds (ref 9.8–11.4)

## 2019-12-25 LAB — PHOSPHORUS     (BH GH L LMW YH): BKR PHOSPHORUS: 3.2 mg/dL (ref 2.2–4.5)

## 2019-12-25 LAB — LACTATE DEHYDROGENASE: BKR LACTATE DEHYDROGENASE: 172 U/L (ref 122–241)

## 2019-12-25 MED ORDER — FAMOTIDINE 4 MG/ML IN STERILE WATER (ADULT)
Freq: Once | INTRAVENOUS | Status: DC | PRN
Start: 2019-12-25 — End: 2019-12-30

## 2019-12-25 MED ORDER — SODIUM CHLORIDE 0.9 % INTRAVENOUS SOLUTION
INTRAVENOUS | Status: DC | PRN
Start: 2019-12-25 — End: 2019-12-30

## 2019-12-25 MED ORDER — MEPERIDINE 25 MG/2.5 ML IN 0.9% SODIUM CHLORIDE
Freq: Once | INTRAVENOUS | Status: DC | PRN
Start: 2019-12-25 — End: 2019-12-30

## 2019-12-25 MED ORDER — EPINEPHRINE 0.3 MG/0.3 ML INJECTION, AUTO-INJECTOR
0.3 mg/ mL | INTRAMUSCULAR | Status: DC | PRN
Start: 2019-12-25 — End: 2019-12-30

## 2019-12-25 MED ORDER — SODIUM CHLORIDE 0.9 % BOLUS (NEW BAG)
0.9 % | Freq: Once | INTRAVENOUS | Status: DC | PRN
Start: 2019-12-25 — End: 2019-12-30

## 2019-12-25 MED ORDER — HIC 2000025461 PEMBROLIZUMAB (MK-3475)
Freq: Once | INTRAVENOUS | Status: CP
Start: 2019-12-25 — End: ?
  Administered 2019-12-25: 18:00:00 100.000 mL/h via INTRAVENOUS

## 2019-12-25 MED ORDER — HYDROCORTISONE SODIUM SUCCINATE 100 MG SOLUTION FOR INJECTION
100 mg | Freq: Once | INTRAVENOUS | Status: DC | PRN
Start: 2019-12-25 — End: 2019-12-30

## 2019-12-25 MED ORDER — DIPHENHYDRAMINE 50 MG/ML INJECTION SOLUTION
50 mg/mL | Freq: Once | INTRAVENOUS | Status: DC | PRN
Start: 2019-12-25 — End: 2019-12-30

## 2019-12-25 NOTE — Progress Notes
Day 1 Cycle 12Pt. ambulated to department alert and oriented x 4 in no obvious distress.No new complain voiced.PIV access gained to leftt hand with 24 G cannula. Bloods drawn and sent to lab.Pt. seen by provider, lab results reviewed and patient cleared for treatment.HIC [id number] pembrolizumab ([id number]) 200 mg in 100 mL of 0.9% sodium chloride infused over 30 minutes.No adverse reaction noted.IV flushed and positive blood return noted.PIV access removed. Gauze and coban applied.Pt. refused AVS. Will use my chart.Pt. left unit in stable condition.Follow-up appointment 01/15/2020.

## 2019-12-25 NOTE — Plan of Care
Spiritual Care NotePurpose: Referral Source: Chaplain Initiated Observation: People present/Information Obtained From: Patient Emotional Mood: Calm, Hopeful, Positive Quality of Relational Support: Family Out-of-Town but Involved, Familiy In-Town and Involved Types of Relational Support: Daughter, Friend Family Dynamics: Dtr in [place] - he lives in [place], coming back for his treatments and appts. Son lives locally Subjective: Jason Rowland found meaning is sharing elements of his life story, his contentment with his situation moving to [facility] where he lives in an attached apartment on his dtr's home, his good fortune in life, and his involvement helping friends in need.He shared the story of his melanoma being found and surgically removed, He presents as being accustomed to his treatment, taking them more or less as a fact of life, and did not desire to discuss the emotional impact of this cancer and long-term treatments. He finds strength in focusing on moving forward and enjoying the life he has.Chaplain introduced spiritual care, offered spiritual and emotional support, and provided opportunities for Jason Rowland to share what is essential to his life and spirit.Marland Kitchen Spiritual Assessment:Referral Source: Chaplain Initiated Information Obtained From: Patient Mood: Calm, Hopeful, PositiveRelational SupportTypes of Relational Support: Daughter, Andrey Cota Dynamics: Dtr in [place] - he lives in [place] and [place], coming back for his treatments and appts. Son lives [place], arts of Relational Support: Family Out-of-Town but Involved, Familiy In-Town and Involved Religious Affiliation: None  Spiritual Resources: Family, ResiliencyHelpful Religious Practices: Life Designer, television/film set RaisedRaised by Patient: Death of Family Member/Friend, Shared Story of Illness, Length of Illness   NA  NASpiritual Interventions:Spiritual Intervention Index**Date of Spiritual Visit: 09/09/21Visit Type: Initial VisitLanguage or special accommodation rendered?: NoIntervention Type: Spiritual VisitResponding Chaplain: Unit ChaplainSpiritual/Religious Support Provided: Companionship, Introduction to [employer], Spiritual SupportOutcome: OUTCOMES: Expressed Spiritual/Religious Resources or Distress: Expressed hope, Identified priorities, Identified important values and beliefs OUTCOMES: Expressed Emotional Resources or Distress: Relational resources identified, Emotional resources utilized OUTCOMES: Progressed Spiritually: Established chaplain relationship OUTCOMES: Progressed Emotionally or Physically: Increased comfortOUTCOMES: Goals of Care: Identified end of life goals    Plan: Chaplain will offer spiritual and emotional support as needed/ desired  throughout Jason Rowland's treatment   Total Consult Time: 35 minutes	Signed: Rabbi Chaplain Ellen Henri, [employer] Chaplain Attending Chaplain [place] NP8 [email or web address] Beat: [phone] of Spiritual Care: [phone] Office: [phone] 9/9/20212:53 PMPlan of Care Overview/ Patient Status

## 2019-12-25 NOTE — Progress Notes
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 No extra-axial collection is seen.Prior lens replacements noted bilaterally.The paranasal sinuses are clear. Small right mastoid effusion noted.IMPRESSION:No evidence of intracranial metastatic disease.WLE and SLN 03/03/19:LYMPH NODES, RIGHT AXILLARY LEVELS 1, 2 AND 3, LYMPH NODE DISSECTION: ?  ? ? - ?THREE OF THIRTY-FIVE LYMPH NODES, POSITIVE FOR MELANOMA (3 OF 35) WITH LARGEST INVOLVED LYMPH NODE MEASURING 4.5 X 3 CM ? ? ?- LYMPHATIC INVASION IDENTIFIED ? ? ?- NO EXTRANODAL EXTENSION IDENTIFIED 1. ?SKIN, BACK, RIGHT UPPER, EXCISION: ? ? ? ?- MALIGNANT MELANOMA, SEE NOTE AND SYNOPTIC SUMMARY Note: Sections show a tumor composed of S100 positive epithelioid cells within the dermis and subcutaneous tissue. Cytokeratin AE1/AE3 and desmin are negative. A junctional component is not identified. The findings would be compatible with a primary dermal melanoma or a metastatic lesion. Clinical correlation is suggested. 2. ?SKIN, BACK, EXCISION: ? ? ? ?- BENIGN FIBROADIPOSE TISSUE AND SKELETAL MUSCLE SYNOPTIC SUMMARY MELANOMA OF THE SKIN Tumor Site: ? ? Back Laterality: ? ? Right Procedure: ? ? Primary excision Maximum Tumor Thickness (Depth): ? ? 30 mm Ulceration: ? ? Not identified Mitotic Rate (Mitoses/mm2): ? ? 2 Anatomic Level: ? ? V (Melanoma invades subcutis) Growth Phase: ? ? Vertical Histologic Type: ? ? Melanoma, NOS Tumor-Infiltrating Lymphocytes: ? ? Present, non-brisk Tumor Regression: ? ? Not identifed Lymphovascular Invasion: ? ? Present Microsatellitosis: ? ? Present Margins ? ? ?Peripheral Margins: ? ? Uninvolved Deep Margin: ? ? Uninvolved Stage (AJCC 8th Ed): ? ? pT4a Nx Base of the tongue and vallecula random biopsies 02/20/19: 1. ?TONGUE, LEFT, BASE, BIOPSY: ? ? ? ?- LYMPHOID TISSUE HYPERPLASIA ? ? ?- NEGATIVE FOR MALIGNANCY 2. ?THROAT, VALLECULA, BIOPSY: ? ?  ? - LYMPHOID TISSUE HYPERPLASIA AND FOCAL CHRONIC INFLAMMATION. SEE NOTE. ? ? ?- NEGATIVE FOR MALIGNANCY ? ? ? ? ? Note: Immunostain of Sox-10 to investigate focal pigment is negative, supporting above interpretation. 3. ?THROAT, LEFT VALLECULA, BIOPSY: ? ? ? ?- LYMPHOID TISSUE HYPERPLASIA ? ? ?- NEGATIVE FOR MALIGNANCY 4. ?THROAT, RIGHT VALLECULA, BIOPSY: ? ? ? ? ? ? - LYMPHOID TISSUE HYPERPLASIA AND FOCAL CHRONIC INFLAMMATION ? ? ?- NEGATIVE FOR MALIGNANCY 5. ?TONGUE, RIGHT BASE, BIOPSY: ? ? ? ? ? ? - LYMPHOID TISSUE HYPERPLASIA AND FOCAL CHRONIC INFLAMMATION ? ? ?- NEGATIVE FOR MALIGNANCY Pathology (reviewed at [facility]), collected 01/07/19:DIAGNOSIS: ? 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 Clinical pathological correlation is recommended. MICROSCOPIC DESCRIPTION: There are atypical cells in a nodule in the dermis. The cells are positive with SOX-10 and by report are negative with LCA, Kappa, Lambda, and a cytokeratin. The cells are also positive with S-100 by report.ASSESSMENT/PLAN:Jason Rowland is a 74 y.o. 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  Initially path read as metastatic melanoma, but further workup confirms that this is the primary site, not a metastatic lesion.  Initial biopsy was likely altered due to repeat I&Ds of the area.  He had a FBSE with his dermatologist and no additional primary lesions were identified.  He is otherwise in good health except for metabolic syndrome and diabetes-associated neuropathy.  PET 02/07/19 showed the main lesion on the right upper back extending to the latissimus dorsi muscle with involved right axillary nodes and  nonspecific avidity in the BOT and preepiglottis for which direct visual evaluation was recommended and done 02/11/19 and random biopsies 02/20/19 without concerns for malignancy. Colonoscopy was also done to evaluate stranding in the transverse colon, which was negative for malignancy on exam.  Now s/p WLE and LND with Dr. Duanne Moron on 03/03/19 showing a stage IIIC melanoma that was 30 mm thick, positive for LVI and microsatellites with 3/25 positive LNs.  Given the thickness of his primary melanoma and microsatellitosis, we felt that his melanoma-specific survival is closer to a stage IIID:  60% over 10 years with stage IIIC disease versus 24% with stage IIID.  He does not have a BRAF mutation, so SOC options are immune therapy at this point versus the Moderna clinical trial (?A Phase 2 Randomized Study of Adjuvant Immunotherapy with the Personalized Cancer Vaccine [phone] and Pembrolizumab Versus Pembrolizumab Alone After Complete Resection of High-Risk Melanoma?) vs active surveillance (for which he would be extremely high risk for relapsing).  He opted for the Moderna trial and signed consent on 03/27/19; tissue passed QC inspection.  Jason Rowland was randomized to the [facility] with vaccine arm of the trial.  He has experienced some weight loss due to loss of taste/smell/appetite after initiation the clinical trial, which is now resolved with weight gain. He has stable right arm lymphedema but declines any lymphedema therapy or sleeves.  He also had some G2 diarrhea after C4, which resolved with Imodium and not requiring anti-diarrheals by C7.  Energy is improved after starting an exercise regimen.- labs reviewed today.  LDH wnl. LFTs wnl.  Bilis are normal.  TSH still pending.  Ok for treatment C12 today. Will get pembrolizumab only from now on. - [facility] CAP 7/12 NED; surveillance scans every 12 weeks (due ~10/4)- routine f/u with dermatologist every 4-6 months.  Last seen 12/23/19.- left toe abrasion resulting in ulcer without infection; has been on abx and knows to check frequently and tightly control glucose to support healing.- RTC in 3 weeks per trial schedule; instructed to call if any issues or questions in the interim30 min were spent in direct patient contact.  He has our clinic contact information and instructed to call if questions.  Drema Halon, MD, PhDMedical Oncology

## 2019-12-26 ENCOUNTER — Encounter: Admit: 2019-12-26 | Payer: PRIVATE HEALTH INSURANCE

## 2019-12-26 NOTE — Progress Notes
HIC # [id number] Cycle 12, Day 1 Patient seen and examined by Dr. Romona Curls and toxicity assessment reviewed by Dr. Lucianne Muss labs are deemed NCS today, Concomitant medications reviewed and updated.Approved for treatment for pembro today per  Dr. Laveda Norman.   ECOG PS = 1  Concomitant medications: MEDICATION SINGLE DOSE UNITS ROUTE FREQUECY START DATE STOP DATE INDICATION Aspirin 81 mg PO QD 2010 ongoing PPX CVD Simvastatin 20 Mg PO QD 2012 ongoing Hypercholesterolemia  Ambien 10 mg PO QD 2016 ongoing insomnia  Glucoside 10 mg PO QD 2014 ongoing diabetes  Toujeo Solostar  80 Units SQ QD  2016  07/08/19 diabetes  guaifenesin 400 mg PO q 4 hours prn 07/10/19 08/16/19 cough  imodium 2 Mg PO PRN 07/19/19 ongoing diarrhea Toujeo Solostar 40-50 units subcutaneous QD 07/09/19 10/01/19 diabetes  Toujeo Solostar 60 units SQ QD 10/02/19  ongoing diabetes                                   Medical History:                                                         PATIENT NAME: Jason Rowland  STUDY ID:  [id number]     0=NA                      1= Unrelated         2= Unlikely 3=Possible         4= Probable           5. Definite (Circle One) 0= No Action Taken                                         1= Con Med                                                            2=Dose Modified                                        3=Dose Delayed                                                         4= Patient Hospitalized                                      5= Patient Taken Off Study                    6=Non Drug Therapy  7=Other (Specify) 1=Recovered                       2=Still under txmt/observation                              3=Alive w/Sequelae                                                                 4=Died                         5=Resolvd to Baseline             6=Grade Change       Medical History                       ADVERSE 12/25/2019 10:37 Sodium Latest Ref Range: 136 - 144 mmol/L 138 Potassium Latest Ref Range: 3.3 - 5.1 mmol/L 4.6 Chloride Latest Ref Range: 98 - 107 mmol/L 100 CO2 Latest Ref Range: 20 - 30 mmol/L 27 Anion Gap Latest Ref Range: 7 - 17  11 BUN Latest Ref Range: 8 - 23 mg/dL 16 Creatinine Latest Ref Range: 0.40 - 1.30 mg/dL 1.61 BUN/Creatinine Ratio Latest Ref Range: 8.0 - 23.0  20.3 eGFR (NON African-American) Latest Ref Range: >60 mL/min/1.46m2 >60 eGFR (Afr Amer) Latest Ref Range: >60 mL/min/1.52m2 >60 Glucose Latest Ref Range: 70 - 100 mg/dL 096 (H) Calcium Latest Ref Range: 8.8 - 10.2 mg/dL 9.6 Magnesium Latest Ref Range: 1.7 - 2.4 mg/dL 1.7 Phosphorus Latest Ref Range: 2.2 - 4.5 mg/dL 3.2 Total Bilirubin Latest Ref Range: <=1.2 mg/dL 0.3 Alkaline Phosphatase Latest Ref Range: 9 - 122 U/L 75 Alanine Aminotransferase (ALT) Latest Ref Range: 9 - 59 U/L 23 Aspartate Aminotransferase (AST) Latest Ref Range: 10 - 35 U/L 21 AST/ALT Ratio Latest Ref Range: See Comment  0.9 LD Latest Ref Range: 122 - 241 U/L 172 Total Protein Latest Ref Range: 6.6 - 8.7 g/dL 7.8 Albumin Latest Ref Range: 3.6 - 4.9 g/dL 4.1 Globulin Latest Ref Range: 2.3 - 3.5 g/dL 3.7 (H) A/G Ratio Latest Ref Range: 1.0 - 2.2  1.1 Total CK Latest Ref Range: 11 - 204 U/L 96 WBC Latest Ref Range: 4.0 - 10.0 x1000/?L 8.3 RBC Latest Ref Range: 3.8 - 5.9 M/?L 4.9 Hemoglobin Latest Ref Range: 12.0 - 18.0 g/dL 04.5 Hematocrit Latest Ref Range: 37.0 - 52.0 % 43.4 MCV Latest Ref Range: 78.0 - 94.0 fL 88.2 MCH Latest Ref Range: 27.0 - 31.0 pg 29.5 MCHC Latest Ref Range: 31.0 - 36.0 g/dL 40.9 RDW-CV Latest Ref Range: 11.5 - 14.5 % 13.7 nRBC Latest Ref Range: 0.0 - 1.0 % 0.0 Platelets Latest Ref Range: 140 - 440 x1000/?L 259 MPV Latest Ref Range: 6.0 - 11.0 fL 10.7 Neutrophils Latest Ref Range: 37.0 - 84.0 % 71.8 Lymphocytes Latest Ref Range: 8.0 - 49.0 % 16.7 Monocytes Latest Ref Range: 4.0 - 15.0 % 8.0 Eosinophils Latest Ref Range: 0.0 - 7.0 % 2.5 Basophils Latest Ref Range: 0.0 - 4.0 % 0.8 ANC (Abs Neutrophil Count) Latest Ref Range: 1.0 - 11.0 x 1000/?L 6.0 Absolute Lymphocyte Count Latest Ref Range: 1.0 - 4.0  x 1000/?L 1.4 Monocytes (Absolute) Latest Ref Range: 0.0 - 2.0 x 1000/?L 0.7 Eosinophil Absolute Count Latest Ref Range: 0.0 - 1.0 x 1000/?L 0.2 Basophils Absolute Latest Ref Range: 0.0 - 0.0 x 1000/?L 0.1 (H) Immature Granulocytes (Abs) Latest Ref Range: 0.0 - 0.3 x 1000/?L 0.0 nRBC Absolute Latest Ref Range: 0.0 - 0.0 x 1000/?L 0.0 Immature Granulocytes Latest Ref Range: 0.0 - 3.0 % 0.2 Prothrombin Time Latest Ref Range: 9.8 - 11.4 seconds 10.4 INR Latest Ref Range: 0.92 - 1.08  0.98 PTT Latest Ref Range: 22.7 - 28.9 seconds 24.4  Ref. Range 12/25/2019 12:51 Clarity, UA Latest Ref Range: Clear  Clear Specific Gravity, UA Latest Ref Range: 1.005 - 1.020  1.024 (H) pH, UA Latest Ref Range: 5.5 - 7.5  6.0 Protein, UA Latest Ref Range: Negative-Trace  2+ (A) Glucose, UA Latest Ref Range: Negative  1+ (A) Ketones, UA Latest Ref Range: Negative  Negative Blood, UA Latest Ref Range: Negative  Negative Bilirubin, UA Latest Ref Range: Negative  Negative Leukocytes, UA Latest Ref Range: Negative  Negative Nitrite, UA Latest Ref Range: Negative  Negative Hyaline Casts, UA Latest Ref Range: 0 - 3 /LPF 1-3 WBC/HPF Latest Ref Range: 0 - 5 /HPF 0-2 RBC/HPF Latest Ref Range: 0 - 2 /HPF 0-2 Mucus, UA Latest Ref Range: None-Few /HPF Few Color, UA Latest Ref Range: Yellow  Yellow Urobilinogen, UA Latest Ref Range: <=2.0 EU/dL 0.2 Epithelial Cells Latest Ref Range: None-Few /LPF Few

## 2020-01-02 ENCOUNTER — Telehealth: Admit: 2020-01-02 | Payer: PRIVATE HEALTH INSURANCE

## 2020-01-02 NOTE — Telephone Encounter
Please see answering service message & advise:

## 2020-01-12 ENCOUNTER — Inpatient Hospital Stay: Admit: 2020-01-12 | Discharge: 2020-01-12 | Payer: BLUE CROSS/BLUE SHIELD

## 2020-01-12 ENCOUNTER — Telehealth: Admit: 2020-01-12 | Payer: PRIVATE HEALTH INSURANCE

## 2020-01-12 DIAGNOSIS — Z006 Encounter for examination for normal comparison and control in clinical research program: Secondary | ICD-10-CM

## 2020-01-12 DIAGNOSIS — C4359 Malignant melanoma of other part of trunk: Secondary | ICD-10-CM

## 2020-01-12 DIAGNOSIS — R918 Other nonspecific abnormal finding of lung field: Secondary | ICD-10-CM

## 2020-01-12 MED ORDER — SODIUM CHLORIDE 0.9 % BOLUS (NEW BAG)
0.9 % | Freq: Once | INTRAVENOUS | Status: CP
Start: 2020-01-12 — End: ?
  Administered 2020-01-12: 14:00:00 0.9 mL/h via INTRAVENOUS

## 2020-01-12 MED ORDER — IOHEXOL 350 MG IODINE/ML INTRAVENOUS SOLUTION
350 mg iodine/mL | Freq: Once | INTRAVENOUS | Status: CP | PRN
Start: 2020-01-12 — End: ?
  Administered 2020-01-12: 14:00:00 350 mL via INTRAVENOUS

## 2020-01-12 NOTE — Telephone Encounter
[facility] #[id number] Returned call to Mr. Sainvil.  Voicemail with no message was left from him.  I tried calling him back.  No answer.  Called again.  Confirmed with him his scan is this morning in [place].  He will head there.  [name] CRA

## 2020-01-13 ENCOUNTER — Encounter: Admit: 2020-01-13 | Payer: PRIVATE HEALTH INSURANCE

## 2020-01-15 ENCOUNTER — Encounter: Admit: 2020-01-15 | Payer: PRIVATE HEALTH INSURANCE

## 2020-01-15 ENCOUNTER — Inpatient Hospital Stay: Admit: 2020-01-15 | Discharge: 2020-01-15 | Payer: BLUE CROSS/BLUE SHIELD

## 2020-01-15 ENCOUNTER — Ambulatory Visit: Admit: 2020-01-15 | Payer: BLUE CROSS/BLUE SHIELD

## 2020-01-15 DIAGNOSIS — C799 Secondary malignant neoplasm of unspecified site: Secondary | ICD-10-CM

## 2020-01-15 DIAGNOSIS — C4359 Malignant melanoma of other part of trunk: Secondary | ICD-10-CM

## 2020-01-15 DIAGNOSIS — Z7982 Long term (current) use of aspirin: Secondary | ICD-10-CM

## 2020-01-15 DIAGNOSIS — E785 Hyperlipidemia, unspecified: Secondary | ICD-10-CM

## 2020-01-15 DIAGNOSIS — E8881 Metabolic syndrome: Secondary | ICD-10-CM

## 2020-01-15 DIAGNOSIS — Z5112 Encounter for antineoplastic immunotherapy: Secondary | ICD-10-CM

## 2020-01-15 DIAGNOSIS — I890 Lymphedema, not elsewhere classified: Secondary | ICD-10-CM

## 2020-01-15 DIAGNOSIS — Z794 Long term (current) use of insulin: Secondary | ICD-10-CM

## 2020-01-15 DIAGNOSIS — Z79899 Other long term (current) drug therapy: Secondary | ICD-10-CM

## 2020-01-15 DIAGNOSIS — Z791 Long term (current) use of non-steroidal anti-inflammatories (NSAID): Secondary | ICD-10-CM

## 2020-01-15 DIAGNOSIS — E1142 Type 2 diabetes mellitus with diabetic polyneuropathy: Secondary | ICD-10-CM

## 2020-01-15 DIAGNOSIS — Z85820 Personal history of malignant melanoma of skin: Secondary | ICD-10-CM

## 2020-01-15 DIAGNOSIS — E7800 Pure hypercholesterolemia, unspecified: Secondary | ICD-10-CM

## 2020-01-15 DIAGNOSIS — E119 Type 2 diabetes mellitus without complications: Secondary | ICD-10-CM

## 2020-01-15 DIAGNOSIS — Z8052 Family history of malignant neoplasm of bladder: Secondary | ICD-10-CM

## 2020-01-15 DIAGNOSIS — Z8249 Family history of ischemic heart disease and other diseases of the circulatory system: Secondary | ICD-10-CM

## 2020-01-15 DIAGNOSIS — Z955 Presence of coronary angioplasty implant and graft: Secondary | ICD-10-CM

## 2020-01-15 DIAGNOSIS — Z8051 Family history of malignant neoplasm of kidney: Secondary | ICD-10-CM

## 2020-01-15 DIAGNOSIS — Z006 Encounter for examination for normal comparison and control in clinical research program: Secondary | ICD-10-CM

## 2020-01-15 LAB — LACTATE DEHYDROGENASE: BKR LACTATE DEHYDROGENASE: 186 U/L (ref 122–241)

## 2020-01-15 LAB — COMPREHENSIVE METABOLIC PANEL
BKR A/G RATIO: 1.2 (ref 1.0–2.2)
BKR ALANINE AMINOTRANSFERASE (ALT): 23 U/L (ref 9–59)
BKR ALBUMIN: 4.1 g/dL (ref 3.6–4.9)
BKR ALKALINE PHOSPHATASE: 74 U/L (ref 9–122)
BKR ANION GAP: 10 g/dL (ref 7–17)
BKR ASPARTATE AMINOTRANSFERASE (AST): 23 U/L (ref 10–35)
BKR AST/ALT RATIO: 1 x 1000/??L (ref 0.0–1.0)
BKR BILIRUBIN TOTAL: 0.2 mg/dL (ref <=1.2)
BKR BLOOD UREA NITROGEN: 24 mg/dL — ABNORMAL HIGH (ref 8–23)
BKR BUN / CREAT RATIO: 27.6 — ABNORMAL HIGH (ref 8.0–23.0)
BKR CALCIUM: 9.1 mg/dL (ref 8.8–10.2)
BKR CHLORIDE: 97 mmol/L — ABNORMAL LOW (ref 98–107)
BKR CO2: 27 mmol/L — ABNORMAL HIGH (ref 20–30)
BKR CREATININE: 0.87 mg/dL (ref 0.40–1.30)
BKR EGFR (AFR AMER): >60 mL/min/{1.73_m2} (ref >60)
BKR EGFR (NON AFRICAN AMERICAN): >60 mL/min/{1.73_m2} — ABNORMAL HIGH (ref >60)
BKR GLOBULIN: 3.5 g/dL (ref 2.3–3.5)
BKR GLUCOSE: 271 mg/dL — ABNORMAL HIGH (ref 70–100)
BKR POTASSIUM: 4.7 mmol/L (ref 3.3–5.1)
BKR PROTEIN TOTAL: 7.6 g/dL (ref 6.6–8.7)
BKR SODIUM: 134 mmol/L — ABNORMAL LOW (ref 136–144)

## 2020-01-15 LAB — CK     (BH GH L LMW YH): BKR CREATINE KINASE TOTAL: 124 U/L (ref 11–204)

## 2020-01-15 LAB — CBC WITH AUTO DIFFERENTIAL
BKR WAM ABSOLUTE IMMATURE GRANULOCYTES: 0 x 1000/??L (ref 0.0–0.3)
BKR WAM ABSOLUTE LYMPHOCYTE COUNT: 1.6 x 1000/??L (ref 1.0–4.0)
BKR WAM ABSOLUTE NRBC: 0 x 1000/??L (ref 0.0–0.0)
BKR WAM ANALYZER ANC: 4.6 x 1000/ÂµL (ref 1.0–11.0)
BKR WAM BASOPHIL ABSOLUTE COUNT: 0.1 x 1000/ÂµL — ABNORMAL HIGH (ref 0.0–0.0)
BKR WAM BASOPHILS: 0.7 % (ref 0.0–4.0)
BKR WAM EOSINOPHIL ABSOLUTE COUNT: 0.3 x 1000/ÂµL (ref 0.0–1.0)
BKR WAM EOSINOPHILS: 3.7 % (ref 0.0–7.0)
BKR WAM HEMATOCRIT: 42.9 % (ref 37.0–52.0)
BKR WAM HEMOGLOBIN: 14.2 g/dL (ref 12.0–18.0)
BKR WAM IMMATURE GRANULOCYTES: 0.4 % (ref 0.0–3.0)
BKR WAM LYMPHOCYTES: 23.2 % — ABNORMAL HIGH (ref 8.0–49.0)
BKR WAM MCH (PG): 29.5 pg (ref 27.0–31.0)
BKR WAM MCHC: 33.1 g/dL (ref 31.0–36.0)
BKR WAM MCV: 89 fL (ref 78.0–94.0)
BKR WAM MONOCYTE ABSOLUTE COUNT: 0.5 x 1000/ÂµL (ref 0.0–2.0)
BKR WAM MONOCYTES: 6.8 % (ref 4.0–15.0)
BKR WAM MPV: 10.5 fL (ref 6.0–11.0)
BKR WAM NEUTROPHILS: 65.2 % (ref 37.0–84.0)
BKR WAM NUCLEATED RED BLOOD CELLS: 0 % (ref 0.0–1.0)
BKR WAM PLATELETS: 263 x1000/ÂµL (ref 140–440)
BKR WAM RDW-CV: 13.4 % (ref 11.5–14.5)
BKR WAM RED BLOOD CELL COUNT: 4.8 M/ÂµL (ref 3.8–5.9)
BKR WAM WHITE BLOOD CELL COUNT: 7 x1000/ÂµL (ref 4.0–10.0)

## 2020-01-15 LAB — PT/INR AND PTT (BH GH L LMW YH)
BKR FREE T4: 25.6 seconds — ABNORMAL LOW (ref 22.7–28.9)
BKR INR: 0.99 (ref 0.92–1.08)
BKR PARTIAL THROMBOPLASTIN TIME: 25.6 s (ref 22.7–28.9)
BKR PROTHROMBIN TIME: 10.5 seconds (ref 9.8–11.4)

## 2020-01-15 LAB — TSH: BKR THYROID STIMULATING HORMONE: 5.12 ??IU/mL — ABNORMAL HIGH

## 2020-01-15 LAB — PHOSPHORUS     (BH GH L LMW YH): BKR PHOSPHORUS: 3 mg/dL (ref 2.2–4.5)

## 2020-01-15 LAB — MAGNESIUM: BKR MAGNESIUM: 1.8 mg/dL (ref 1.7–2.4)

## 2020-01-15 LAB — T3: BKR T3 TOTAL: 75 ng/dL

## 2020-01-15 MED ORDER — FAMOTIDINE 4 MG/ML IN STERILE WATER (ADULT)
Freq: Once | INTRAVENOUS | Status: DC | PRN
Start: 2020-01-15 — End: 2020-01-20

## 2020-01-15 MED ORDER — ALBUTEROL SULFATE 2.5 MG/3 ML (0.083 %) SOLUTION FOR NEBULIZATION
2.5 mg /3 mL (0.083 %) | RESPIRATORY_TRACT | Status: DC | PRN
Start: 2020-01-15 — End: 2020-01-20

## 2020-01-15 MED ORDER — SODIUM CHLORIDE 0.9 % BOLUS (NEW BAG)
0.9 % | Freq: Once | INTRAVENOUS | Status: DC | PRN
Start: 2020-01-15 — End: 2020-01-20

## 2020-01-15 MED ORDER — MEPERIDINE 25 MG/2.5 ML IN 0.9% SODIUM CHLORIDE
Freq: Once | INTRAVENOUS | Status: DC | PRN
Start: 2020-01-15 — End: 2020-01-20

## 2020-01-15 MED ORDER — EPINEPHRINE 0.3 MG/0.3 ML INJECTION, AUTO-INJECTOR
0.3 mg/ mL | INTRAMUSCULAR | Status: DC | PRN
Start: 2020-01-15 — End: 2020-01-20

## 2020-01-15 MED ORDER — HIC 2000025461 PEMBROLIZUMAB (MK-3475)
Freq: Once | INTRAVENOUS | Status: CP
Start: 2020-01-15 — End: ?
  Administered 2020-01-15: 17:00:00 100.000 mL/h via INTRAVENOUS

## 2020-01-15 MED ORDER — HYDROCORTISONE SODIUM SUCCINATE 100 MG SOLUTION FOR INJECTION
100 mg | Freq: Once | INTRAVENOUS | Status: DC | PRN
Start: 2020-01-15 — End: 2020-01-20

## 2020-01-15 MED ORDER — DIPHENHYDRAMINE 50 MG/ML INJECTION SOLUTION
50 mg/mL | Freq: Once | INTRAVENOUS | Status: DC | PRN
Start: 2020-01-15 — End: 2020-01-20

## 2020-01-15 NOTE — Progress Notes
NAME:  Jason Rowland (05/21/1945)AGE: 74 y.o. MRN:  [id number] PROVIDER: Drema Halon, MD, PhDDATE OF SERVICE: 01/15/2020 [facility] [place]  REASON FOR VISIT:  C13D1 of HIC 25461ONC DX:  Invasive melanoma right mid back, stage IIIC (pT4a N3 M0); 30 mm with microsatellitosis, mitotic rate 2 per mm2.0% tumor and immune cell PD-L1 staining50-gene panel:  DNA VARIANT DETECTED ??? ??? ALLELIC FRACTION NRAS c.182A>G (p.Gln61Arg) ??? ??? 74% TREATMENT HX:- Adjuvant Moderna clinical trial (???A Phase 2 Randomized Study of Adjuvant Immunotherapy with the Personalized Cancer Vaccine mRNA-4157 and Pembrolizumab Versus Pembrolizumab Alone After Complete Resection of High-Risk Melanoma??? Protocol mRNA-[id number], HIC 40981) C1D1 05/08/19.  Randomized to the pembrolizumab and vaccine arm. Onc Hx: Jason Rowland (prefers to be called Jason Rowland) is a 74 y.o. male who worked for 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  He reports it appeared approx 10 months prior; his PCP originally thought it was a pimple.  Dr. Lorn Junes at [facility] later evaluated it and also thought it was a pimple and injected it with intralesional kenalog x 2 and oral cephalexin without improvement.  The lesion increased in size and burned whenever he leaned against anything.  Denies night sweats and weight loss.  Lucie Leather did a I&D on 01/07/19  with only slight bloody discharge, so a punch biopsy was obtain with path showing metastatic melanoma.  On 01/20/19, additional biopsies were done to try to evaluate a primary site, but we do not have this tissue.  Per patient report, these biopsies were negative. MRI brain 01/30/19 without intracranial disease.  PET done 02/07/19 showing preepiglottic FDG activity s/p ENT evaluation which was negative for any visible malignancy.  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 LDH elevated to 251 at diagnosis. His case was presented at the [facility] Melanoma tumor board on 02/13/19 and consensus was to ask ENT for random biopsies of the BOT and pre-epiglottic PET-avid areas, as we do not want to miss a head and neck cancer but otherwise not felt to be related to his melanoma.  The cervical LN were not thought to be related.  He should also have gastroenterology evaluation for a PET-avid stranding lesion in the transverse colon.  Dr. Landis Gandy (ENT) performed BOT and vallecula random biopsies 02/20/19 which only showed chronic inflammation and no malignancy.  He had a colonoscopy by Dr. Linton Flemings on 02/27/19 showing only a tubular adenoma in the sigmoid colon.  Overall, it was felt that his right mid back lesion is the site of his primary disease, and the metastatic melanoma pathology interpretation may have been complicated due to repeat I&Ds and steroid injections that may have altered the architecture of the primary lesion.  In the absence of any confirmed metastatic disease, he underwent WLE and LND with Dr. Duanne Moron on 03/03/19.  Final path showed a 30 mm thick melanoma, 2 mitoses/mm2, non-brisk TILs, no tumor regression, LVI+, microsatellites+, negative margins, and 3/35 lymph nodes (largest LN 4.5 x 3 cm) were positive for melanoma with presence of lymphatic invasion without extranodal extension.He uses a cane sometimes as he has some balance issues due to neuropathy in his feet.  Otherwise, he is fully functional, active, and independent in ADLs/iADLs.  No hx of autoimmune disease.03/27/19 signed consent for the Moderna trial; randomized to [facility]  and vaccine arm.  Started treatment C1D1 05/08/19.04/24/19 noted to have wound dehiscence, non-infected, healing by secondary intent.  Re-instated VNA services to assist with dressing changes and packing. Wound completely healed by 06/20/19.Surveillance scans 10/27/19 NED.Interval KG:MWNU returns for follow-up today and C13 of his clinical trial; has been done with the vaccine and only getting pembrolizumab.Had a Moderna COVID booster vaccine on Saturday in the left arm and had diarrhea for 2 days afterwards; since resolved.  BP and fasting glucose elevated in the last few days.  Denies HAs, lightheadedness, or dizziness.Since his last treatment 3 weeks ago, he notes the following???issues/updates:???GI:-Diarrhea: no episodes lately due to treatment; had some episodes related to COVID-19 booster now resolved as noted above.-taste changes/loss of smell and appetite (06/22/19) resolved 08/21/19 back to baseline.  Has some recurrence of sx since (09/25/19) -- still improved from March; remains resolved to baseline.-ongoing occasional gas pains Gr1 with sensation of bloating, intermittent relieved with flatus; stable???Resp:-DOE with walking long distances about a quarter of a mile; stable (pre-dated tx on trial)???Extremities:- lower extremity swelling stable???Metabolic-???long-acting???insulin dose 70 units qhs.- fatigue???(Gr1)???since 3/11 which is improved with swimming and walking. ???Has difficulty going to sleep, stable; still taking his chronic ambien. Denies any fever. ???ECOG:  1Pain: reported as 0 today (does have hx of chronic neuropathy pain)Review of Systems:No fevers, chills, night sweats, lightheadedness, HA, CP, palpitations, wheezing, abd pain, constipation, blood in the urine or stools, dysuria.  Diabetic neuropathy in the hands and feet stable.  He has a prior hx of intermittent loose stools, chronic, without blood depending on his diet (often isolated to a specific restaurant he likes to visit with his buddies).  Positive responses on ROS as noted in interval hx.PAST MEDICAL HISTORY:Past Medical History: Diagnosis Date ??? Diabetes mellitus (HC Code)  ??? High cholesterol  ??? Malignant melanoma of torso excluding breast (HC Code)  SURGICAL HISTORY:Past Surgical History: Procedure Laterality Date ??? COLONOSCOPY   ??? CORONARY ANGIOPLASTY WITH STENT PLACEMENT Bilateral  ??? laryngoscopy with biopsy  02/20/2019 ??? ROTATOR CUFF REPAIR Left 1999 ??? TONSILLECTOMY    age 81 ??? TOTAL HIP ARTHROPLASTY Right 2002 FAMILY HISTORY:Family History Problem Relation Age of Onset ??? Heart disease Mother  ??? Bladder cancer Father 56 ??? Kidney cancer Sister  ??? Leukemia Brother 36 ??? No Known Problems Daughter  ??? No Known Problems Son  SOCIAL HISTORY:Social History Socioeconomic History ??? Marital status: Divorced   Spouse name: Not on file ??? Number of children: Not on file ??? Years of education: Not on file ??? Highest education level: Not on file Occupational History ??? Not on file Tobacco Use ??? Smoking status: Never Smoker ??? Smokeless tobacco: Never Used Vaping Use ??? Vaping Use: Never used Substance and Sexual Activity ??? Alcohol use: Not Currently ??? Drug use: No ??? Sexual activity: Not on file Other Topics Concern ??? Not on file Social History Narrative  Divorced, works for [employer] in [place] will be retiring 03/17/19.  Has 1 son in [place] and 1 daughter in [place]. Llives in [place]  Social Determinants of Health Financial Resource Strain:  ??? Difficulty of Paying Living Expenses:  Food Insecurity:  ??? Worried About Programme researcher, broadcasting/film/video in the Last Year:  ??? Barista in the Last Year:  Transportation Needs:  ??? Freight forwarder (Medical):  ??? Lack of Transportation (Non-Medical):  Physical Activity:  ??? Days of Exercise per Week:  ??? Minutes of Exercise per Session:  Stress:  ??? Feeling of Stress :  Social Connections:  ??? Frequency of Communication with Friends and Family:  ??? Frequency of Social Gatherings with Friends and Family:  ??? Attends Religious Services: ??? Database administrator or Organizations:  ??? Attends Engineer, structural:  ??? Marital Status:  Intimate Programme researcher, broadcasting/film/video Violence:  ??? Fear of Current or Ex-Partner:  ??? Emotionally Abused:  ??? Physically Abused:  ??? Sexually Abused:  ALLERGIES:No Known AllergiesMEDICATIONS:Current Outpatient Medications Medication Sig ??? acetaminophen (TYLENOL) 325 mg tablet Take 3 tablets (975 mg total) by mouth every 6 (six) hours as needed (mild to moderate pain). ??? ALPRAZolam (XANAX) 0.5 mg tablet 0.5 mg.  (Patient not taking: Reported on 12/25/2019) ??? aspirin 81 MG EC tablet Take 81 mg by mouth nightly.  ??? bedside commode 3 in 1 commode (Patient not taking: Reported on 03/07/2019) ??? docusate sodium (COLACE) 250 mg capsule Take 1 capsule (250 mg total) by mouth 2 (two) times daily as needed for constipation. (Patient not taking: Reported on 08/21/2019) ??? FREESTYLE LIBRE 14 DAY sensor kit 1 each by Other route every 14 (fourteen) days. ??? glipiZIDE (GLUCOTROL XL) 10 MG 24 hr tablet Take 10 mg by mouth nightly.  ??? ibuprofen (ADVIL,MOTRIN) 200 mg tablet Take 2 tablets (400 mg total) by mouth every 6 (six) hours as needed (alternate with tylenol for mild to moderate pain). ??? olopatadine (PATANOL) 0.1 % ophthalmic solution as needed (takes for eye irritation as needed).  (Patient not taking: Reported on 12/25/2019) ??? simvastatin (ZOCOR) 20 MG tablet Take 20 mg by mouth nightly.  ??? TOUJEO SOLOSTAR U-300 300 unit/mL (1.5 mL) pen Inject 30 Units under the skin nightly. ??? walker Misc Use as directed. (Patient not taking: Reported on 12/25/2019) ??? zolpidem (AMBIEN) 10 mg tablet nightly.. No current facility-administered medications for this visit. Facility-Administered Medications Ordered in Other Visits Medication ??? albuterol neb sol 2.5 mg/3 mL (0.083%) (PROVENTIL,VENTOLIN) ??? diphenhydrAMINE (BENADRYL) injection 50 mg ??? EPINEPHrine (EPI-PEN) auto-injector 0.3 mg ??? EPINEPHrine (EPI-PEN) auto-injector 0.3 mg ??? famotidine (PF) (PEPCID) 20 mg in water for injection, sterile 5 mL Injection ??? HIC [id number] Pembrolizumab (MK-3475) 200 mg in sodium chloride 0.9% 100 mL investigational study drug ??? hydrocortisone sodium succinate (Solu-CORTEF) injection 100 mg ??? meperidine PF (DEMEROL) 25 mg in sodium chloride 0.9% PF 2.5 mL Injection ??? sodium chloride 0.9 % (new bag) bolus 500 mL VITALS: BP (!) 163/92  - Pulse 85  - Temp 97.9 ???F (36.6 ???C)  - Resp 20  - Wt 119.7 kg  - SpO2 98%  - BMI 37.70 kg/m???  PHYSICAL EXAM:Gen:  NAD, pleasant HEENT:  EOMI, PEARLLN:  No cervical, supraclavicular, or inguinal LAD.  Left pectoral LN approx 1 cm.CV:  RRR, no m/r/g Pulm:  CTA b/lAbd:  Soft, no tenderness, no guarding or rebound, no organomegaly, no ascitesExt:  WWP, 2+ symmetric edema in the ankles.  Lymphedema present in the right arm, unchanged.Neuro:   Grossly intactPsych:  Normal mood and affectSkin:  Scar on the right upper back well healed. Small lateral skin puckering at the edges of the scar stable.  Some flaking skin/dryness in the middle portion of the scar.  Right axillary scar well healed without concerning nodularity.  Medial left big toe abrasion healed.    LABS:Results for orders placed or performed during the hospital encounter of 01/15/20 PT/INR and PTT     (BH GH L YH) Result Value Ref Range  Prothrombin Time 10.5 9.8 - 11.4 seconds  INR 0.99 0.92 - 1.08  PTT 25.6 22.7 -  28.9 seconds CK     (BH GH L LMW YH) Result Value Ref Range  Total CK 124 11 - 204 U/L Lactate dehydrogenase Result Value Ref Range  LD 186 122 - 241 U/L Phosphorus     (BH GH L LMW YH) Result Value Ref Range  Phosphorus 3.0 2.2 - 4.5 mg/dL MAGNESIUM Result Value Ref Range  Magnesium 1.8 1.7 - 2.4 mg/dL Comprehensive metabolic panel Result Value Ref Range  Sodium 134 (L) 136 - 144 mmol/L  Potassium 4.7 3.3 - 5.1 mmol/L  Chloride 97 (L) 98 - 107 mmol/L  CO2 27 20 - 30 mmol/L  Anion Gap 10 7 - 17  Glucose 271 (H) 70 - 100 mg/dL  BUN 24 (H) 8 - 23 mg/dL  Creatinine 8.11 9.14 - 1.30 mg/dL  Calcium 9.1 8.8 - 78.2 mg/dL  BUN/Creatinine Ratio 95.6 (H) 8.0 - 23.0  Total Protein 7.6 6.6 - 8.7 g/dL  Albumin 4.1 3.6 - 4.9 g/dL  Total Bilirubin 0.2 <=2.1 mg/dL  Alkaline Phosphatase 74 9 - 122 U/L  Alanine Aminotransferase (ALT) 23 9 - 59 U/L  Aspartate Aminotransferase (AST) 23 10 - 35 U/L  Globulin 3.5 2.3 - 3.5 g/dL  A/G Ratio 1.2 1.0 - 2.2  AST/ALT Ratio 1.0 See Comment  eGFR (Afr Amer) >60 >60 mL/min/1.13m2  eGFR (NON African-American) >60 >60 mL/min/1.41m2 CBC auto differential Result Value Ref Range  WBC 7.0 4.0 - 10.0 x1000/???L  RBC 4.8 3.8 - 5.9 M/???L  Hemoglobin 14.2 12.0 - 18.0 g/dL  Hematocrit 30.8 65.7 - 52.0 %  MCV 89.0 78.0 - 94.0 fL  MCHC 33.1 31.0 - 36.0 g/dL  RDW-CV 84.6 96.2 - 95.2 %  Platelets 263 140 - 440 x1000/???L  MPV 10.5 6.0 - 11.0 fL  ANC (Abs Neutrophil Count) 4.6 1.0 - 11.0 x 1000/???L  Neutrophils 65.2 37.0 - 84.0 %  Lymphocytes 23.2 8.0 - 49.0 %  Absolute Lymphocyte Count 1.6 1.0 - 4.0 x 1000/???L  Monocytes 6.8 4.0 - 15.0 %  Monocyte Absolute Count 0.5 0.0 - 2.0 x 1000/???L  Eosinophils 3.7 0.0 - 7.0 %  Eosinophil Absolute Count 0.3 0.0 - 1.0 x 1000/???L  Basophil 0.7 0.0 - 4.0 %  Basophil Absolute Count 0.1 (H) 0.0 - 0.0 x 1000/???L  Immature Granulocytes 0.4 0.0 - 3.0 %  Absolute Immature Granulocyte Count 0.0 0.0 - 0.3 x 1000/???L  nRBC 0.0 0.0 - 1.0 %  Absolute nRBC 0.0 0.0 - 0.0 x 1000/???L  MCH 29.5 27.0 - 31.0 pg IMAGING/PATH:[place] abd/pelvis 01/12/20:  NEDCT chest 01/12/20:  1.  Interval increase in size of a left subpectoral lymph node as described, nonspecific but could represent metastatic disease. Otherwise no enlarging lymph nodes identified.2.  Multiple pulmonary nodules measuring up to 5 mm, stable since  [place] chest dated 04/17/2019. Follow-up as per oncologic protocol is recommended.MRI Brain and [place] CAP 04/17/19:  NEDPET 02/07/19:Head And Neck: Base of tongue and preepiglottic FDG activity is nonspecific (SUV max 6.6).Mildly FDG avid cervical lymph nodes are nonspecific, for example:*  Left level 2 ([place] image 874, 1.4 x 0.8 cm, SUV max 2.6)*  Right level 2 ([place] image 878, 1.4 x 1 cm, SUV max 2.1)CHEST:Hypermetabolic mass overlying the right posterior 10th rib is consistent with malignancy, and involves the skin, subcutaneous fat, extending to the latissimus dorsi muscle (SUV max 15, 6.3 x 3 x 5.9 cm).Two hypermetabolic right axillary lymph nodes are highly suspicious for metastases (up to SUV max 8.3, 2.4 x 1.7 cm). Multiple  additional smaller right subpectoral lymph nodes are minimally FDG avid, and are indeterminate (SUV max 1.2, subcentimeter).Azygos pulmonary fissure is incidentally noted. No suspicious pulmonary nodule is identified, although pulmonary detail is partially obscured by breathing motion. Coronary arteries and the aortic valve are calcified. The heart is on the upper bound of normal size. Nonspecific pattern of myocardial FDG activity. No mediastinal lymphadenopathy is identified.Abdomen/Pelvis: Diffuse hepatic and splenic heterogeneity most likely represents artifactual image noise, however this could conceal any small lesions.Multiple colonic diverticuli show no evidence of diverticulitis. Diffuse patchy colonic FDG activity is nonspecific, commonly inflammatory or related to a pharmacologic effect, however this could conceal any small lesions (SUV max 9 the sigmoid). Left renal cortical defect associated with a punctuate calcification likely represents scarring. Right renal calyceal calcifications likely represent nonobstructing stones (up to 0.5 cm).Normal appearance of the gallbladder, spleen, stomach, pancreas, adrenal glands, small bowel, decompressed urinary bladder.Streak artifacts arising from metallic right hip prostheses obscure nearby structures.Small retroperitoneal lymph nodes are not FDG avid, although below resolution of PET. Otherwise, no retroperitoneal, mesenteric, or pelvic lymphadenopathy is identified. Inguinal lymph nodes are nonspecific (SUV max 1.5), commonly inflammatory.MUSCULOSKELETAL:Heterogeneous marrow FDG activity is likely due to a combination of artifactual image noise, and marrow activation, however this reduces sensitivity for any small lesions (SUV max 4.6 in the sacrum). Right knee FDG activity associated with cruciate ligaments and synovium is likely degenerative or posttraumatic (SUV max 5), with small knee effusion (SUV max 3.3).IMPRESSION:1.  The accession number [id number] does not have any associated images in PACS at the time of dictation.  PET-[place] images associated with the accession number [id number] / [id number] were interpreted in this report.2.  Hypermetabolic mass overlying the right posterior 10th rib is consistent with malignancy, and involves the skin, subcutaneous fat, extending to the latissimus dorsi muscle.3.  Right axillary nodal metastases.4.  Multiple small right subpectoral lymph nodes are indeterminate; metastases are not excluded.5.  Base of tongue and preepiglottic FDG activity is nonspecific, with slight asymmetric fullness on the left. Direct visualization is recommended to exclude primary neoplasm.6.  Minimally FDG avid bilateral level 2 cervical lymph nodes are nonspecific, possibly inflammatory.MRI brain 01/30/19:There is no intracranial hemorrhage or major vascular distribution infarct.No abnormal enhancement is seen.There is no mass effect, edema, or midline shift.The ventricles are symmetric and normal in size. Several scattered foci of T2/FLAIR hyperintense signal are noted in the periventricular and subcortical white matter, likely sequela of chronic small vessel ischemic disease. No extra-axial collection is seen.Prior lens replacements noted bilaterally.The paranasal sinuses are clear. Small right mastoid effusion noted.IMPRESSION:No evidence of intracranial metastatic disease.WLE and SLN 03/03/19:LYMPH NODES, RIGHT AXILLARY LEVELS 1, 2 AND 3, LYMPH NODE DISSECTION: ???  ??? ??? - ???THREE OF THIRTY-FIVE LYMPH NODES, POSITIVE FOR MELANOMA (3 OF 35) WITH LARGEST INVOLVED LYMPH NODE MEASURING 4.5 X 3 CM ??? ??? ???- LYMPHATIC INVASION IDENTIFIED ??? ??? ???- NO EXTRANODAL EXTENSION IDENTIFIED 1. ???SKIN, BACK, RIGHT UPPER, EXCISION: ??? ??? ??? ???- MALIGNANT MELANOMA, SEE NOTE AND SYNOPTIC SUMMARY Note: Sections show a tumor composed of S100 positive epithelioid cells within the dermis and subcutaneous tissue. Cytokeratin AE1/AE3 and desmin are negative. A junctional component is not identified. The findings would be compatible with a primary dermal melanoma or a metastatic lesion. Clinical correlation is suggested. 2. ???SKIN, BACK, EXCISION: ??? ??? ??? ???- BENIGN FIBROADIPOSE TISSUE AND SKELETAL MUSCLE SYNOPTIC SUMMARY MELANOMA OF THE SKIN Tumor Site: ??? ??? Back Laterality: ??? ??? Right Procedure: ??? ??? Primary excision Maximum Tumor Thickness (Depth): ??? ???  30 mm Ulceration: ??? ??? Not identified Mitotic Rate (Mitoses/mm2): ??? ??? 2 Anatomic Level: ??? ??? V (Melanoma invades subcutis) Growth Phase: ??? ??? Vertical Histologic Type: ??? ??? Melanoma, NOS Tumor-Infiltrating Lymphocytes: ??? ??? Present, non-brisk Tumor Regression: ??? ??? Not identifed Lymphovascular Invasion: ??? ??? Present Microsatellitosis: ??? ??? Present Margins ??? ??? ???Peripheral Margins: ??? ??? Uninvolved Deep Margin: ??? ??? Uninvolved Stage (AJCC 8th Ed): ??? ??? pT4a Nx Base of the tongue and vallecula random biopsies 02/20/19: 1. ???TONGUE, LEFT, BASE, BIOPSY: ??? ??? ??? ???- LYMPHOID TISSUE HYPERPLASIA ??? ??? ???- NEGATIVE FOR MALIGNANCY 2. ???THROAT, VALLECULA, BIOPSY: ??? ???  ??? - LYMPHOID TISSUE HYPERPLASIA AND FOCAL CHRONIC INFLAMMATION. SEE NOTE. ??? ??? ???- NEGATIVE FOR MALIGNANCY ??? ??? ??? ??? ??? Note: Immunostain of Sox-10 to investigate focal pigment is negative, supporting above interpretation. 3. ???THROAT, LEFT VALLECULA, BIOPSY: ??? ??? ??? ???- LYMPHOID TISSUE HYPERPLASIA ??? ??? ???- NEGATIVE FOR MALIGNANCY 4. ???THROAT, RIGHT VALLECULA, BIOPSY: ??? ??? ??? ??? ??? ??? - LYMPHOID TISSUE HYPERPLASIA AND FOCAL CHRONIC INFLAMMATION ??? ??? ???- NEGATIVE FOR MALIGNANCY 5. ???TONGUE, RIGHT BASE, BIOPSY: ??? ??? ??? ??? ??? ??? - LYMPHOID TISSUE HYPERPLASIA AND FOCAL CHRONIC INFLAMMATION ??? ??? ???- NEGATIVE FOR MALIGNANCY Pathology (reviewed at [facility]), collected 01/07/19:DIAGNOSIS: ??? 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 Clinical pathological correlation is recommended. MICROSCOPIC DESCRIPTION: There are atypical cells in a nodule in the dermis. The cells are positive with SOX-10 and by report are negative with LCA, Kappa, Lambda, and a cytokeratin. The cells are also positive with S-100 by report.ASSESSMENT/PLAN:[name] is a 74 y.o. 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  Initially path read as metastatic melanoma, but further workup confirms that this is the primary site, not a metastatic lesion.  Initial biopsy was likely altered due to repeat I&Ds of the area.  He had a FBSE with his dermatologist and no additional primary lesions were identified.  He is otherwise in good health except for metabolic syndrome and diabetes-associated neuropathy.  PET 02/07/19 showed the main lesion on the right upper back extending to the latissimus dorsi muscle with involved right axillary nodes and nonspecific avidity in the BOT and preepiglottis for which direct visual evaluation was recommended and done 02/11/19 and random biopsies 02/20/19 without concerns for malignancy. Colonoscopy was also done to evaluate stranding in the transverse colon, which was negative for malignancy on exam.  Now s/p WLE and LND with Dr. Duanne Moron on 03/03/19 showing a stage IIIC melanoma that was 30 mm thick, positive for LVI and microsatellites with 3/25 positive LNs.  Given the thickness of his primary melanoma and microsatellitosis, we felt that his melanoma-specific survival is closer to a stage IIID:  60% over 10 years with stage IIIC disease versus 24% with stage IIID.  He does not have a BRAF mutation, so SOC options are immune therapy at this point versus the Moderna clinical trial (???A Phase 2 Randomized Study of Adjuvant Immunotherapy with the Personalized Cancer Vaccine [phone] and Pembrolizumab Versus Pembrolizumab Alone After Complete Resection of High-Risk Melanoma???) vs active surveillance (for which he would be extremely high risk for relapsing).  He opted for the Moderna trial and signed consent on 03/27/19; tissue passed QC inspection.  Jason Rowland was randomized to the [facility] with vaccine arm of the trial.  He  has experienced some weight loss due to loss of taste/smell/appetite after initiation the clinical trial, which is now resolved with weight gain. He has stable right arm lymphedema but declines any lymphedema therapy or sleeves.  He also had some G2 diarrhea after C4, which resolved with Imodium and not requiring anti-diarrheals by C7.  Energy is improved after starting an exercise regimen.- labs reviewed today.  LDH wnl.  LFTs wnl.  Bilis are normal.  TSH still pending.  Ok for treatment C13 today. Will get pembrolizumab only.- [place] CAP 7/12 NED; [place] CAP 01/12/20 showing a non-specific 1.1 cm left subpectoral LN likely related to COVID-19, no definitive evidence of metastatic disease; repeat surveillance scans every 12 weeks (due in Dec)- routine f/u with dermatologist every 4-6 months.  Last seen 12/23/19.- RTC in 3 weeks per trial schedule; instructed to call if any issues or questions in the interim30 min were spent in direct patient contact.  He has our clinic contact information and instructed to call if questions.  Drema Halon, MD, PhDMedical Oncology  CC  Note to his dermatologist

## 2020-01-15 NOTE — Progress Notes
Arrived to clinic for cycle 13 day 1 of HIC [id number] Pembrolizumab.PIV placed and labs drawn.Seen and cleared to treat by Dr. Laveda Norman and [facility].Pre research labs drawn at 1240 pm.PIV with positive blood return and HIC [id number] Pembrolizumab infused over 30 minutes with out issues from 1245-1315. PIV with positive blood return at completion, flushed, removed and band aide applied.RTC on 10/19 for next treatment.D/C'd from clinic in stable condition in the care of self.

## 2020-01-15 NOTE — Progress Notes
HIC # [id number] Cycle 13, Day 1 Patient seen and examined by Dr. Romona Curls and toxicity assessment reviewed by Dr. Lucianne Muss labs are deemed NCS today, Scans reviewed by Dr. Laveda Norman, no evidence recurrent disease.Concomitant medications reviewed and updated.Approved for treatment for pembro today per  Dr. Laveda Norman.   ECOG PS = 1  Concomitant medications: MEDICATION SINGLE DOSE UNITS ROUTE FREQUECY START DATE STOP DATE INDICATION Aspirin 81 mg PO QD 2010 ongoing PPX CVD Simvastatin 20 Mg PO QD 2012 ongoing Hypercholesterolemia  Ambien 10 mg PO QD 2016 ongoing insomnia  Glucoside 10 mg PO QD 2014 ongoing diabetes  Toujeo Solostar  80 Units SQ QD  2016  07/08/19 diabetes  guaifenesin 400 mg PO q 4 hours prn 07/10/19 08/16/19 cough  imodium 2 Mg PO PRN 07/19/19 ongoing diarrhea Toujeo Solostar 40-50 units subcutaneous QD 07/09/19 10/01/19 diabetes  Toujeo Solostar 60 units SQ QD 10/02/19  ongoing diabetes Cefalexin 250 Mg Oral TID 12/24/19 01/02/20 PPX Infection  Moderna COVID Booster 1  Injection IM Once 01/10/20 01/10/20 PPX COVID-19                   Medical History:                                                         PATIENT NAME: Jason Rowland  STUDY ID:  [id number]     0=NA                      1= Unrelated         2= Unlikely 3=Possible         4= Probable           5. Definite (Circle One) 0= No Action Taken                                         1= Con Med                                                            2=Dose Modified                                        3=Dose Delayed                                                         4= Patient Hospitalized                                      5= Patient Taken Off Study                    6=Non Drug Therapy  7=Other (Specify) 1=Recovered                       2=Still under txmt/observation                              3=Alive w/Sequelae 4=Died                         5=Resolvd to Baseline             6=Grade Change       Medical History                       ADVERSE EVENT  Is AE Intermittent? Y/N SAE             Y/N Grade Expect PEMBRO Y/N ?Relation to [facility] Expect mRNA            Y/N ?Relation to mRNA Start Date End Date/ or ?Ongoing Action Taken               Designer, jewellery # above) AE OUTCOME   Hypercholesterolemia N N 1 Med Hx Med Hx Med Hx Med Hx 2012 ongoing 1= simvastatin 2   diabetes N N 1 Med Hx Med Hx Med Hx Med Hx 2010 ongoing 1- glucoside; toujeo 2   Insomnia N N 1 Med Hx Med Hx Med Hx Med Hx 2016 ongoing 1- ambien 2   Edema limbs N N 1 Med Hx  Med Hx Med Hx Med Hx  03/03/2019 ongoing 0 2   Diarrhea  N N 1 Y  1  N/A  N/A  05/27/2019 05/27/2019 0 5   fatigue   N N  1   Y 3   Y 3   06/26/19  ongoing  0 2    dysgeusia N N 1 N 2 N 2 06/22/19 08/21/19 0 2   anorexia N N 1 Y 2 Y 2 06/22/19 08/21/19 0 2   Pain, injection site Y N 1 N 1 Y 4 06/19/19 06/23/19 12/25/19 2   Eye disorders, other (left eye droop) N N 1 N 2 N 2 07/01/19 07/31/19 0 2   vomiting N N 1 Y 3 Y 3 07/08/19 07/08/19 0 1   dirrahea Y N 1 Y 3 Y 3 07/08/19 07/08/19 0 2   cough N N 1 Y 3 Y 3 07/08/19 08/16/19 1 = gueifenisen 2   ALT increased N N 1 Y 3 Y 3 07/10/19 07/31/19 0 2   AST increased  N N 1 Y 3 Y 3 07/10/19 07/31/19 0 2   Alkaline phos increased N N 2 Y 3 Y 3 07/10/19 07/31/19 0 2   Diarrhea  Y N 2 Y 3 Y 3 07/09/19 07/20/2019 1- imodium 1   Injection site pain Y N 1 N 1 Y 3 07/08/19 ongoing 0 2   bloating Y N 1 N 1 N 1 07/08/19 ongoing 0 2   Bilateral foot neuropathy N N 1 N/A N/A N/A N/A Baseline 02/2019 ongoing 0 2   Dyspnea on exertion Y N 1 N/A N/A N/A N/A Baseline 02/2019 ongoing 0 2   Diarrhea  N N 1 Y 1 Y 1 09/25/19  09/25/19 1- imodium 1   Anorexia  N N 1 Y 2 Y 2 10/02/19  11/13/19 0 2   Diarrhea  N N  1 Y 1 Y 1 10/16/19  10/17/19 0 1   diarrhea  N N 1 Y 1 Y 1 11/14/19  11/15/19 0 1                 diarrhea  N N 1 Y 1 Y 1 01/10/20  01/12/20 0 1                                                                                    Labs:Results for VIDIT, BEAUBRUN (MRN [id number]) as of 01/15/2020 15:36 Ref. Range 01/15/2020 10:41 01/15/2020 10:42 Sodium Latest Ref Range: 136 - 144 mmol/L  134 (L) Potassium Latest Ref Range: 3.3 - 5.1 mmol/L  4.7 Chloride Latest Ref Range: 98 - 107 mmol/L  97 (L) CO2 Latest Ref Range: 20 - 30 mmol/L  27 Anion Gap Latest Ref Range: 7 - 17   10 BUN Latest Ref Range: 8 - 23 mg/dL  24 (H) Creatinine Latest Ref Range: 0.40 - 1.30 mg/dL  9.14 BUN/Creatinine Ratio Latest Ref Range: 8.0 - 23.0   27.6 (H) eGFR (NON African-American) Latest Ref Range: >60 mL/min/1.31m2  >60 eGFR (Afr Amer) Latest Ref Range: >60 mL/min/1.55m2  >60 Glucose Latest Ref Range: 70 - 100 mg/dL  782 (H) Calcium Latest Ref Range: 8.8 - 10.2 mg/dL  9.1 Magnesium Latest Ref Range: 1.7 - 2.4 mg/dL  1.8 Phosphorus Latest Ref Range: 2.2 - 4.5 mg/dL  3.0 Total Bilirubin Latest Ref Range: <=1.2 mg/dL  0.2 Alkaline Phosphatase Latest Ref Range: 9 - 122 U/L  74 Alanine Aminotransferase (ALT) Latest Ref Range: 9 - 59 U/L  23 Aspartate Aminotransferase (AST) Latest Ref Range: 10 - 35 U/L  23 AST/ALT Ratio Latest Ref Range: See Comment   1.0 LD Latest Ref Range: 122 - 241 U/L  186 Total Protein Latest Ref Range: 6.6 - 8.7 g/dL  7.6 Albumin Latest Ref Range: 3.6 - 4.9 g/dL  4.1 Globulin Latest Ref Range: 2.3 - 3.5 g/dL  3.5 A/G Ratio Latest Ref Range: 1.0 - 2.2   1.2 Total CK Latest Ref Range: 11 - 204 U/L  124 T3, Total Latest Ref Range: See Comment ng/dL  95.6 Thyroid Stimulating Hormone, 3rd Gen. Latest Ref Range: See Comment ???IU/mL  5.120 (H) Free T4 Latest Ref Range: See Comment ng/dL  2.13 (L) WBC Latest Ref Range: 4.0 - 10.0 x1000/???L 7.0  RBC Latest Ref Range: 3.8 - 5.9 M/???L 4.8  Hemoglobin Latest Ref Range: 12.0 - 18.0 g/dL 08.6  Hematocrit Latest Ref Range: 37.0 - 52.0 % 42.9  MCV Latest Ref Range: 78.0 - 94.0 fL 89.0  MCH Latest Ref Range: 27.0 - 31.0 pg 29.5  MCHC Latest Ref Range: 31.0 - 36.0 g/dL 57.8  RDW-CV Latest Ref Range: 11.5 - 14.5 % 13.4  nRBC Latest Ref Range: 0.0 - 1.0 % 0.0  Platelets Latest Ref Range: 140 - 440 x1000/???L 263  MPV Latest Ref Range: 6.0 - 11.0 fL 10.5  Neutrophils Latest Ref Range: 37.0 - 84.0 % 65.2  Lymphocytes Latest Ref Range: 8.0 - 49.0 % 23.2  Monocytes Latest Ref Range: 4.0 - 15.0 % 6.8  Eosinophils Latest Ref Range: 0.0 -  7.0 % 3.7  Basophils Latest Ref Range: 0.0 - 4.0 % 0.7  ANC (Abs Neutrophil Count) Latest Ref Range: 1.0 - 11.0 x 1000/???L 4.6  Absolute Lymphocyte Count Latest Ref Range: 1.0 - 4.0 x 1000/???L 1.6  Monocytes (Absolute) Latest Ref Range: 0.0 - 2.0 x 1000/???L 0.5  Eosinophil Absolute Count Latest Ref Range: 0.0 - 1.0 x 1000/???L 0.3  Basophils Absolute Latest Ref Range: 0.0 - 0.0 x 1000/???L 0.1 (H)  Immature Granulocytes (Abs) Latest Ref Range: 0.0 - 0.3 x 1000/???L 0.0  nRBC Absolute Latest Ref Range: 0.0 - 0.0 x 1000/???L 0.0  Immature Granulocytes Latest Ref Range: 0.0 - 3.0 % 0.4  Prothrombin Time Latest Ref Range: 9.8 - 11.4 seconds 10.5  INR Latest Ref Range: 0.92 - 1.08  0.99  PTT Latest Ref Range: 22.7 - 28.9 seconds 25.6

## 2020-02-03 ENCOUNTER — Encounter: Admit: 2020-02-03 | Payer: PRIVATE HEALTH INSURANCE

## 2020-02-03 ENCOUNTER — Ambulatory Visit: Admit: 2020-02-03 | Payer: BLUE CROSS/BLUE SHIELD

## 2020-02-03 ENCOUNTER — Inpatient Hospital Stay: Admit: 2020-02-03 | Discharge: 2020-02-03 | Payer: BLUE CROSS/BLUE SHIELD

## 2020-02-03 DIAGNOSIS — Z5112 Encounter for antineoplastic immunotherapy: Secondary | ICD-10-CM

## 2020-02-03 DIAGNOSIS — Z006 Encounter for examination for normal comparison and control in clinical research program: Secondary | ICD-10-CM

## 2020-02-03 DIAGNOSIS — Z7982 Long term (current) use of aspirin: Secondary | ICD-10-CM

## 2020-02-03 DIAGNOSIS — Z79899 Other long term (current) drug therapy: Secondary | ICD-10-CM

## 2020-02-03 DIAGNOSIS — Z8052 Family history of malignant neoplasm of bladder: Secondary | ICD-10-CM

## 2020-02-03 DIAGNOSIS — Z794 Long term (current) use of insulin: Secondary | ICD-10-CM

## 2020-02-03 DIAGNOSIS — Z806 Family history of leukemia: Secondary | ICD-10-CM

## 2020-02-03 DIAGNOSIS — E1142 Type 2 diabetes mellitus with diabetic polyneuropathy: Secondary | ICD-10-CM

## 2020-02-03 DIAGNOSIS — Z8051 Family history of malignant neoplasm of kidney: Secondary | ICD-10-CM

## 2020-02-03 DIAGNOSIS — Z955 Presence of coronary angioplasty implant and graft: Secondary | ICD-10-CM

## 2020-02-03 DIAGNOSIS — C779 Secondary and unspecified malignant neoplasm of lymph node, unspecified: Secondary | ICD-10-CM

## 2020-02-03 DIAGNOSIS — C4359 Malignant melanoma of other part of trunk: Secondary | ICD-10-CM

## 2020-02-03 DIAGNOSIS — E119 Type 2 diabetes mellitus without complications: Secondary | ICD-10-CM

## 2020-02-03 DIAGNOSIS — E785 Hyperlipidemia, unspecified: Secondary | ICD-10-CM

## 2020-02-03 DIAGNOSIS — E7800 Pure hypercholesterolemia, unspecified: Secondary | ICD-10-CM

## 2020-02-03 LAB — MAGNESIUM: BKR MAGNESIUM: 1.9 mg/dL (ref 1.7–2.4)

## 2020-02-03 LAB — PHOSPHORUS     (BH GH L LMW YH): BKR PHOSPHORUS: 3 mg/dL (ref 2.2–4.5)

## 2020-02-03 LAB — CBC WITH AUTO DIFFERENTIAL
BKR ANION GAP: 13.5 % (ref 11.5–14.5)
BKR GLUCOSE: 273 x1000/??L — ABNORMAL HIGH (ref 140–440)
BKR WAM ABSOLUTE IMMATURE GRANULOCYTES: 0 x 1000/ÂµL (ref 0.0–0.3)
BKR WAM ABSOLUTE LYMPHOCYTE COUNT: 1.4 x 1000/??L (ref 1.0–4.0)
BKR WAM ABSOLUTE NRBC: 0 x 1000/??L (ref 0.0–0.0)
BKR WAM ANALYZER ANC: 6.8 x 1000/??L (ref 1.0–11.0)
BKR WAM BASOPHIL ABSOLUTE COUNT: 0.1 x 1000/ÂµL — ABNORMAL HIGH (ref 0.0–0.0)
BKR WAM BASOPHILS: 0.7 % (ref 0.0–4.0)
BKR WAM EOSINOPHIL ABSOLUTE COUNT: 0.2 x 1000/??L (ref 0.0–1.0)
BKR WAM EOSINOPHILS: 2.4 % (ref 0.0–7.0)
BKR WAM HEMATOCRIT: 45 % (ref 37.0–52.0)
BKR WAM HEMOGLOBIN: 14.9 g/dL (ref 12.0–18.0)
BKR WAM IMMATURE GRANULOCYTES: 0.4 % (ref 0.0–3.0)
BKR WAM LYMPHOCYTES: 15.1 % — ABNORMAL HIGH (ref 8.0–49.0)
BKR WAM MCH (PG): 29.2 pg (ref 27.0–31.0)
BKR WAM MCHC: 33.1 g/dL (ref 31.0–36.0)
BKR WAM MCV: 88.1 fL (ref 78.0–94.0)
BKR WAM MONOCYTE ABSOLUTE COUNT: 0.7 x 1000/ÂµL (ref 0.0–2.0)
BKR WAM MONOCYTES: 7.1 % (ref 4.0–15.0)
BKR WAM MPV: 11 fL (ref 6.0–11.0)
BKR WAM NEUTROPHILS: 74.3 % (ref 37.0–84.0)
BKR WAM NUCLEATED RED BLOOD CELLS: 0 % (ref 0.0–1.0)
BKR WAM PLATELETS: 273 x1000/ÂµL (ref 140–440)
BKR WAM RDW-CV: 13.5 % (ref 11.5–14.5)
BKR WAM RED BLOOD CELL COUNT: 5.1 M/??L (ref 3.8–5.9)
BKR WAM WHITE BLOOD CELL COUNT: 9.1 x1000/??L (ref 4.0–10.0)

## 2020-02-03 LAB — COMPREHENSIVE METABOLIC PANEL
BKR A/G RATIO: 1 (ref 1.0–2.2)
BKR ALANINE AMINOTRANSFERASE (ALT): 25 U/L (ref 9–59)
BKR ALBUMIN: 4.1 g/dL (ref 3.6–4.9)
BKR ALKALINE PHOSPHATASE: 83 U/L (ref 9–122)
BKR ASPARTATE AMINOTRANSFERASE (AST): 23 U/L (ref 10–35)
BKR AST/ALT RATIO: 0.9 x 1000/??L (ref 0.0–0.3)
BKR BILIRUBIN TOTAL: 0.3 mg/dL (ref <=1.2)
BKR BLOOD UREA NITROGEN: 20 mg/dL (ref 8–23)
BKR BUN / CREAT RATIO: 26.3 — ABNORMAL HIGH (ref 8.0–23.0)
BKR CALCIUM: 9.2 mg/dL (ref 8.8–10.2)
BKR CHLORIDE: 99 mmol/L (ref 98–107)
BKR CO2: 28 mmol/L (ref 20–30)
BKR CREATININE: 0.76 mg/dL (ref 0.40–1.30)
BKR EGFR (AFR AMER): >60 mL/min/{1.73_m2} (ref >60)
BKR EGFR (NON AFRICAN AMERICAN): >60 mL/min/{1.73_m2} (ref >60)
BKR GLOBULIN: 4.1 g/dL — ABNORMAL HIGH (ref 2.3–3.5)
BKR POTASSIUM: 4.5 mmol/L (ref 3.3–5.3)
BKR PROTEIN TOTAL: 8.2 g/dL (ref 6.6–8.7)
BKR SODIUM: 138 mmol/L (ref 136–144)

## 2020-02-03 LAB — LACTATE DEHYDROGENASE: BKR LACTATE DEHYDROGENASE: 176 U/L (ref 122–241)

## 2020-02-03 LAB — PT/INR AND PTT (BH GH L LMW YH)
BKR INR: 0.99 (ref 0.87–1.14)
BKR PARTIAL THROMBOPLASTIN TIME: 25.6 s (ref 23.0–31.4)
BKR PROTHROMBIN TIME: 10.8 seconds (ref 9.6–12.3)

## 2020-02-03 LAB — CK     (BH GH L LMW YH): BKR CREATINE KINASE TOTAL: 166 U/L (ref 11–204)

## 2020-02-03 MED ORDER — INSULIN LISPRO (U-100) 100 UNIT/ML SUBCUTANEOUS SOLUTION
100 unit/mL | Freq: Once | SUBCUTANEOUS | Status: CP
Start: 2020-02-03 — End: ?
  Administered 2020-02-03: 17:00:00 100 mL via SUBCUTANEOUS

## 2020-02-03 MED ORDER — HIC 2000025461 PEMBROLIZUMAB (MK-3475)
Freq: Once | INTRAVENOUS | Status: CP
Start: 2020-02-03 — End: ?
  Administered 2020-02-03: 18:00:00 100.000 mL/h via INTRAVENOUS

## 2020-02-03 MED ORDER — DIPHENHYDRAMINE 50 MG/ML INJECTION SOLUTION
50 mg/mL | Freq: Once | INTRAVENOUS | Status: DC | PRN
Start: 2020-02-03 — End: 2020-02-08

## 2020-02-03 MED ORDER — EPINEPHRINE 0.3 MG/0.3 ML INJECTION, AUTO-INJECTOR
0.3 mg/ mL | INTRAMUSCULAR | Status: DC | PRN
Start: 2020-02-03 — End: 2020-02-08

## 2020-02-03 MED ORDER — HYDROCORTISONE SODIUM SUCCINATE 100 MG SOLUTION FOR INJECTION
100 mg | Freq: Once | INTRAVENOUS | Status: DC | PRN
Start: 2020-02-03 — End: 2020-02-08

## 2020-02-03 MED ORDER — MEPERIDINE 25 MG/2.5 ML IN 0.9% SODIUM CHLORIDE
Freq: Once | INTRAVENOUS | Status: DC | PRN
Start: 2020-02-03 — End: 2020-02-08

## 2020-02-03 MED ORDER — ALBUTEROL SULFATE 2.5 MG/3 ML (0.083 %) SOLUTION FOR NEBULIZATION
2.5 mg /3 mL (0.083 %) | RESPIRATORY_TRACT | Status: DC | PRN
Start: 2020-02-03 — End: 2020-02-08

## 2020-02-03 MED ORDER — SODIUM CHLORIDE 0.9 % BOLUS (NEW BAG)
0.9 % | Freq: Once | INTRAVENOUS | Status: DC | PRN
Start: 2020-02-03 — End: 2020-02-08

## 2020-02-03 MED ORDER — FAMOTIDINE 4 MG/ML IN STERILE WATER (ADULT)
Freq: Once | INTRAVENOUS | Status: DC | PRN
Start: 2020-02-03 — End: 2020-02-08

## 2020-02-03 NOTE — Progress Notes
NAME:  Jason Rowland ([birth year])AGE: 74 y.o. MRN:  [id number] PROVIDER: Drema Halon, MD, PhDDATE OF SERVICE: 02/03/2020 [facility] [place]  REASON FOR VISIT:  C13D1 of HIC 25461ONC DX:  Invasive melanoma right mid back, stage IIIC (pT4a N3 M0); 30 mm with microsatellitosis, mitotic rate 2 per mm2.0% tumor and immune cell PD-L1 staining50-gene panel:  DNA VARIANT DETECTED ? ? 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 ? 74% TREATMENT HX:- Adjuvant Moderna clinical trial (?A Phase 2 Randomized Study of Adjuvant Immunotherapy with the Personalized Cancer Vaccine [phone] and Pembrolizumab Versus Pembrolizumab Alone After Complete Resection of High-Risk Melanoma? Protocol [id number]-[id number], HIC 47829) C1D1 05/08/19.  Randomized to the pembrolizumab and vaccine arm. Onc Hx: Jason Rowland (prefers to be called Jason Rowland) is a 74 y.o. male who worked for 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  He reports it appeared approx 10 months prior; his PCP originally thought it was a pimple.  Dr. Lorn Junes at [facility] later evaluated it and also thought it was a pimple and injected it with intralesional kenalog x 2 and oral cephalexin without improvement.  The lesion increased in size and burned whenever he leaned against anything.  Denies night sweats and weight loss.  Lucie Leather did a I&D on 01/07/19  with only slight bloody discharge, so a punch biopsy was obtain with path showing metastatic melanoma.  On 01/20/19, additional biopsies were done to try to evaluate a primary site, but we do not have this tissue.  Per patient report, these biopsies were negative. MRI brain 01/30/19 without intracranial disease.  PET done 02/07/19 showing preepiglottic FDG activity s/p ENT evaluation which was negative for any visible malignancy.  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 LDH elevated to 251 at diagnosis. His case was presented at the [facility] Melanoma tumor board on 02/13/19 and consensus was to ask ENT for random biopsies of the BOT and pre-epiglottic PET-avid areas, as we do not want to miss a head and neck cancer but otherwise not felt to be related to his melanoma.  The cervical LN were not thought to be related.  He should also have gastroenterology evaluation for a PET-avid stranding lesion in the transverse colon.  Dr. Landis Gandy (ENT) performed BOT and vallecula random biopsies 02/20/19 which only showed chronic inflammation and no malignancy.  He had a colonoscopy by Dr. Linton Flemings on 02/27/19 showing only a tubular adenoma in the sigmoid colon.  Overall, it was felt that his right mid back lesion is the site of his primary disease, and the metastatic melanoma pathology interpretation may have been complicated due to repeat I&Ds and steroid injections that may have altered the architecture of the primary lesion.  In the absence of any confirmed metastatic disease, he underwent WLE and LND with Dr. Duanne Moron on 03/03/19.  Final path showed a 30 mm thick melanoma, 2 mitoses/mm2, non-brisk TILs, no tumor regression, LVI+, microsatellites+, negative margins, and 3/35 lymph nodes (largest LN 4.5 x 3 cm) were positive for melanoma with presence of lymphatic invasion without extranodal extension.He uses a cane sometimes as he has some balance issues due to neuropathy in his feet.  Otherwise, he is fully functional, active, and independent in ADLs/iADLs.  No hx of autoimmune disease.03/27/19 signed consent for the Moderna trial; randomized to [facility]  and vaccine arm.  Started treatment C1D1 05/08/19.04/24/19 noted to have wound dehiscence, non-infected, healing by secondary intent.  Re-instated VNA services to assist with dressing changes and packing. Wound completely healed by 06/20/19.Surveillance scans 10/27/19 NED.Interval ZO:XWRU returns for follow-up today and C14 of his clinical trial; has been done with the vaccine and only getting pembrolizumab.BP and fasting glucose remains elevated.  Denies HAs, lightheadedness, or dizziness.Since his last treatment 3 weeks ago, he notes the following?issues/updates:?GI:-Diarrhea: no episodes lately due to treatment.-taste changes/loss of smell and appetite (06/22/19) resolved 08/21/19 back to baseline.  Has some recurrence of sx since (09/25/19) -- still improved from March; remains resolved to baseline.-ongoing occasional gas pains Gr1 with sensation of bloating, intermittent relieved with flatus; stable?Resp:-DOE with walking long distances about a quarter of a mile; stable (pre-dated tx on trial)?Extremities:- lower extremity swelling stable?Metabolic-?long-acting?insulin dose 70 units qhs - unchanged.- fatigue?(Gr1)?since 3/11 which is improved with swimming and walking. ?Has difficulty going to sleep, stable; still taking his chronic ambien. Denies any fever. ?ECOG:  1Pain: reported as 0 today (does have hx of chronic neuropathy pain)Review of Systems:No fevers, chills, night sweats, lightheadedness, HA, CP, palpitations, wheezing, abd pain, constipation, blood in the urine or stools, dysuria.  Diabetic neuropathy in the hands and feet stable.  He has a prior hx of intermittent loose stools, chronic, without blood depending on his diet (often isolated to a specific restaurant he likes to visit with his buddies).  Positive responses on ROS as noted in interval hx.PAST MEDICAL HISTORY:Past Medical History: Diagnosis Date ? Diabetes mellitus (HC Code) (HC CODE)  ? High cholesterol  ? Malignant melanoma of torso excluding breast (HC Code) (HC CODE)  SURGICAL HISTORY:Past Surgical History: Procedure Laterality Date ? COLONOSCOPY   ? CORONARY ANGIOPLASTY WITH STENT PLACEMENT Bilateral  ? laryngoscopy with biopsy  02/20/2019 ? ROTATOR CUFF REPAIR Left 1999 ? TONSILLECTOMY    age 89 ? TOTAL HIP ARTHROPLASTY Right 2002 FAMILY HISTORY:Family History Problem Relation Age of Onset ? Heart disease Mother  ? Bladder cancer Father 43 ? Kidney cancer Sister  ? Leukemia Brother 32 ? No Known Problems Daughter  ? No Known Problems Son  SOCIAL HISTORY:Social History Socioeconomic History ? Marital status: Divorced   Spouse name: Not on file ? Number of children: Not on file ? Years of education: Not on file ? Highest education level: Not on file Occupational History ? Not on file Tobacco Use ? Smoking status: Never Smoker ? Smokeless tobacco: Never Used Vaping Use ? Vaping Use: Never used Substance and Sexual Activity ? Alcohol use: Not Currently ? Drug use: No ? Sexual activity: Not on file Other Topics Concern ? Not on file Social History Narrative  Divorced, works for [employer] in [place] will be retiring 03/17/19.  Has 1 son in [place] and 1 daughter in [place]. Llives in [place]  Social Determinants of Health Financial Resource Strain:  ? Difficulty of Paying Living Expenses:  Food Insecurity:  ? Worried About Programme researcher, broadcasting/film/video in the Last Year:  ? Barista in the Last Year:  Transportation Needs:  ? Freight forwarder (Medical):  ? Lack of Transportation (Non-Medical):  Physical Activity:  ? Days of Exercise per Week:  ? Minutes of Exercise per Session:  Stress:  ? Feeling of Stress :  Social Connections:  ? Frequency of Communication with Friends and Family:  ? Frequency of Social Gatherings with Friends and Family:  ? Attends Religious Services:  ? Active Member of  Clubs or Organizations:  ? Attends Engineer, structural:  ? Marital Status:  Intimate Partner Violence:  ? Fear of Current or Ex-Partner:  ? Emotionally Abused:  ? Physically Abused:  ? Sexually Abused:  ALLERGIES:No Known AllergiesMEDICATIONS:Current Outpatient Medications Medication Sig ? acetaminophen (TYLENOL) 325 mg tablet Take 3 tablets (975 mg total) by mouth every 6 (six) hours as needed (mild to moderate pain). ? ALPRAZolam (XANAX) 0.5 mg tablet 0.5 mg.  (Patient not taking: Reported on 12/25/2019) ? aspirin 81 MG EC tablet Take 81 mg by mouth nightly.  ? bedside commode 3 in 1 commode (Patient not taking: Reported on 03/07/2019) ? docusate sodium (COLACE) 250 mg capsule Take 1 capsule (250 mg total) by mouth 2 (two) times daily as needed for constipation. (Patient not taking: Reported on 08/21/2019) ? FREESTYLE LIBRE 14 DAY sensor kit 1 each by Other route every 14 (fourteen) days. ? glipiZIDE (GLUCOTROL XL) 10 MG 24 hr tablet Take 10 mg by mouth nightly.  ? ibuprofen (ADVIL,MOTRIN) 200 mg tablet Take 2 tablets (400 mg total) by mouth every 6 (six) hours as needed (alternate with tylenol for mild to moderate pain). ? olopatadine (PATANOL) 0.1 % ophthalmic solution as needed (takes for eye irritation as needed).  (Patient not taking: Reported on 12/25/2019) ? simvastatin (ZOCOR) 20 MG tablet Take 20 mg by mouth nightly.  ? TOUJEO SOLOSTAR U-300 300 unit/mL (1.5 mL) pen Inject 30 Units under the skin nightly. ? walker Misc Use as directed. (Patient not taking: Reported on 12/25/2019) ? zolpidem (AMBIEN) 10 mg tablet nightly.. No current facility-administered medications for this visit. Facility-Administered Medications Ordered in Other Visits Medication ? albuterol neb sol 2.5 mg/3 mL (0.083%) (PROVENTIL,VENTOLIN) ? diphenhydrAMINE (BENADRYL) injection 50 mg ? EPINEPHrine (EPI-PEN) auto-injector 0.3 mg ? EPINEPHrine (EPI-PEN) auto-injector 0.3 mg ? famotidine (PF) (PEPCID) 20 mg in water for injection, sterile 5 mL Injection ? HIC [id number] Pembrolizumab (MK-3475) 200 mg in sodium chloride 0.9% 100 mL investigational study drug ? hydrocortisone sodium succinate (Solu-CORTEF) injection 100 mg ? meperidine PF (DEMEROL) 25 mg in sodium chloride 0.9% PF 2.5 mL Injection ? sodium chloride 0.9 % (new bag) bolus 500 mL VITALS: BP (!) 166/90  - Pulse (!) 91  - Temp 97.5 ?F (36.4 ?C) (Temporal)  - Resp 18  - Wt 119.3 kg  - SpO2 97%  - BMI 37.57 kg/m?  PHYSICAL EXAM:Gen:  NAD, pleasant HEENT:  EOMI, PEARLLN:  No cervical, supraclavicular, axillary, or inguinal LAD. CV:  No murmurs, tachy, no m/r/g Pulm:  CTA b/lAbd:  Soft, no tenderness, no guarding or rebound, no organomegaly, no ascitesExt:  WWP, 1+ symmetric edema in the ankles.  Lymphedema present in the right arm, unchanged.Neuro:   Grossly intactPsych:  Normal mood and affectSkin:  Scar on the right upper back well healed. Small lateral skin puckering at the edges of the scar stable.  Some flaking skin/dryness in the middle portion of the scar.  Right axillary scar well healed.LABS:Results for orders placed or performed during the hospital encounter of 02/03/20 MAGNESIUM Result Value Ref Range  Magnesium 1.9 1.7 - 2.4 mg/dL Phosphorus     (BH GH L LMW YH) Result Value Ref Range  Phosphorus 3.0 2.2 - 4.5 mg/dL Lactate dehydrogenase Result Value Ref Range  LD 176 122 - 241 U/L CK     (BH GH L LMW YH) Result Value Ref Range  Total CK 166 11 - 204 U/L CBC auto differential Result Value Ref Range  WBC 9.1 4.0 -  10.0 x1000/?L  RBC 5.1 3.8 - 5.9 M/?L  Hemoglobin 14.9 12.0 - 18.0 g/dL  Hematocrit 09.6 04.5 - 52.0 %  MCV 88.1 78.0 - 94.0 fL  MCHC 33.1 31.0 - 36.0 g/dL  RDW-CV 40.9 81.1 - 91.4 %  Platelets 273 140 - 440 x1000/?L  MPV 11.0 6.0 - 11.0 fL  ANC (Abs Neutrophil Count) 6.8 1.0 - 11.0 x 1000/?L  Neutrophils 74.3 37.0 - 84.0 %  Lymphocytes 15.1 8.0 - 49.0 %  Absolute Lymphocyte Count 1.4 1.0 - 4.0 x 1000/?L  Monocytes 7.1 4.0 - 15.0 %  Monocyte Absolute Count 0.7 0.0 - 2.0 x 1000/?L  Eosinophils 2.4 0.0 - 7.0 %  Eosinophil Absolute Count 0.2 0.0 - 1.0 x 1000/?L  Basophil 0.7 0.0 - 4.0 %  Basophil Absolute Count 0.1 (H) 0.0 - 0.0 x 1000/?L  Immature Granulocytes 0.4 0.0 - 3.0 %  Absolute Immature Granulocyte Count 0.0 0.0 - 0.3 x 1000/?L  nRBC 0.0 0.0 - 1.0 %  Absolute nRBC 0.0 0.0 - 0.0 x 1000/?L  MCH 29.2 27.0 - 31.0 pg Comprehensive metabolic panel Result Value Ref Range  Sodium 138 136 - 144 mmol/L  Potassium 4.5 3.3 - 5.3 mmol/L  Chloride 99 98 - 107 mmol/L  CO2 28 20 - 30 mmol/L  Anion Gap 11 7 - 17  Glucose 375 (H) 70 - 100 mg/dL  BUN 20 8 - 23 mg/dL  Creatinine 7.82 9.56 - 1.30 mg/dL  Calcium 9.2 8.8 - 21.3 mg/dL  BUN/Creatinine Ratio 08.6 (H) 8.0 - 23.0  Total Protein 8.2 6.6 - 8.7 g/dL  Albumin 4.1 3.6 - 4.9 g/dL  Total Bilirubin 0.3 <=5.7 mg/dL  Alkaline Phosphatase 83 9 - 122 U/L  Alanine Aminotransferase (ALT) 25 9 - 59 U/L  Aspartate Aminotransferase (AST) 23 10 - 35 U/L  Globulin 4.1 (H) 2.3 - 3.5 g/dL  A/G Ratio 1.0 1.0 - 2.2  AST/ALT Ratio 0.9 See Comment  eGFR (Afr Amer) >60 >60 mL/min/1.60m2  eGFR (NON African-American) >60 >60 mL/min/1.54m2 PT/INR and PTT     (BH GH L YH) Result Value Ref Range  Prothrombin Time 10.8 9.6 - 12.3 seconds  INR 0.99 0.87 - 1.14  PTT 25.6 23.0 - 31.4 seconds IMAGING/PATH:[place] abd/pelvis 01/12/20:  NEDCT chest 01/12/20:  1.  Interval increase in size of a left subpectoral lymph node as described, nonspecific but could represent metastatic disease. Otherwise no enlarging lymph nodes identified.2.  Multiple pulmonary nodules measuring up to 5 mm, stable since  [place] chest dated 04/17/2019. Follow-up as per oncologic protocol is recommended.MRI Brain and [place] CAP 04/17/19:  NEDPET 02/07/19:Head And Neck: Base of tongue and preepiglottic FDG activity is nonspecific (SUV max 6.6).Mildly FDG avid cervical lymph nodes are nonspecific, for example:*  Left level 2 ([place] image 874, 1.4 x 0.8 cm, SUV max 2.6)*  Right level 2 ([place] image 878, 1.4 x 1 cm, SUV max 2.1)CHEST:Hypermetabolic mass overlying the right posterior 10th rib is consistent with malignancy, and involves the skin, subcutaneous fat, extending to the latissimus dorsi muscle (SUV max 15, 6.3 x 3 x 5.9 cm).Two hypermetabolic right axillary lymph nodes are highly suspicious for metastases (up to SUV max 8.3, 2.4 x 1.7 cm). Multiple additional smaller right subpectoral lymph nodes are minimally FDG avid, and are indeterminate (SUV max 1.2, subcentimeter).Azygos pulmonary fissure is incidentally noted. No suspicious pulmonary nodule is identified, although pulmonary detail is partially obscured by breathing motion. Coronary arteries and the aortic valve are calcified. The heart is  on the upper bound of normal size. Nonspecific pattern of myocardial FDG activity. No mediastinal lymphadenopathy is identified.Abdomen/Pelvis: Diffuse hepatic and splenic heterogeneity most likely represents artifactual image noise, however this could conceal any small lesions.Multiple colonic diverticuli show no evidence of diverticulitis. Diffuse patchy colonic FDG activity is nonspecific, commonly inflammatory or related to a pharmacologic effect, however this could conceal any small lesions (SUV max 9 the sigmoid). Left renal cortical defect associated with a punctuate calcification likely represents scarring. Right renal calyceal calcifications likely represent nonobstructing stones (up to 0.5 cm).Normal appearance of the gallbladder, spleen, stomach, pancreas, adrenal glands, small bowel, decompressed urinary bladder.Streak artifacts arising from metallic right hip prostheses obscure nearby structures.Small retroperitoneal lymph nodes are not FDG avid, although below resolution of PET. Otherwise, no retroperitoneal, mesenteric, or pelvic lymphadenopathy is identified. Inguinal lymph nodes are nonspecific (SUV max 1.5), commonly inflammatory.MUSCULOSKELETAL:Heterogeneous marrow FDG activity is likely due to a combination of artifactual image noise, and marrow activation, however this reduces sensitivity for any small lesions (SUV max 4.6 in the sacrum). Right knee FDG activity associated with cruciate ligaments and synovium is likely degenerative or posttraumatic (SUV max 5), with small knee effusion (SUV max 3.3).IMPRESSION:1.  The accession number [id number] does not have any associated images in PACS at the time of dictation.  PET-[place] images associated with the accession number [id number] / [id number] were interpreted in this report.2.  Hypermetabolic mass overlying the right posterior 10th rib is consistent with malignancy, and involves the skin, subcutaneous fat, extending to the latissimus dorsi muscle.3.  Right axillary nodal metastases.4.  Multiple small right subpectoral lymph nodes are indeterminate; metastases are not excluded.5.  Base of tongue and preepiglottic FDG activity is nonspecific, with slight asymmetric fullness on the left. Direct visualization is recommended to exclude primary neoplasm.6.  Minimally FDG avid bilateral level 2 cervical lymph nodes are nonspecific, possibly inflammatory.MRI brain 01/30/19:There is no intracranial hemorrhage or major vascular distribution infarct.No abnormal enhancement is seen.There is no mass effect, edema, or midline shift.The ventricles are symmetric and normal in size. Several scattered foci of T2/FLAIR hyperintense signal are noted in the periventricular and subcortical white matter, likely sequela of chronic small vessel ischemic disease. No extra-axial collection is seen.Prior lens replacements noted bilaterally.The paranasal sinuses are clear. Small right mastoid effusion noted.IMPRESSION:No evidence of intracranial metastatic disease.WLE and SLN 03/03/19:LYMPH NODES, RIGHT AXILLARY LEVELS 1, 2 AND 3, LYMPH NODE DISSECTION: ?  ? ? - ?THREE OF THIRTY-FIVE LYMPH NODES, POSITIVE FOR MELANOMA (3 OF 35) WITH LARGEST INVOLVED LYMPH NODE MEASURING 4.5 X 3 CM ? ? ?- LYMPHATIC INVASION IDENTIFIED ? ? ?- NO EXTRANODAL EXTENSION IDENTIFIED 1. ?SKIN, BACK, RIGHT UPPER, EXCISION: ? ? ? ?- MALIGNANT MELANOMA, SEE NOTE AND SYNOPTIC SUMMARY Note: Sections show a tumor composed of S100 positive epithelioid cells within the dermis and subcutaneous tissue. Cytokeratin AE1/AE3 and desmin are negative. A junctional component is not identified. The findings would be compatible with a primary dermal melanoma or a metastatic lesion. Clinical correlation is suggested. 2. ?SKIN, BACK, EXCISION: ? ? ? ?- BENIGN FIBROADIPOSE TISSUE AND SKELETAL MUSCLE SYNOPTIC SUMMARY MELANOMA OF THE SKIN Tumor Site: ? ? Back Laterality: ? ? Right Procedure: ? ? Primary excision Maximum Tumor Thickness (Depth): ? ? 30 mm Ulceration: ? ? Not identified Mitotic Rate (Mitoses/mm2): ? ? 2 Anatomic Level: ? ? V (Melanoma invades subcutis) Growth Phase: ? ? Vertical Histologic Type: ? ? Melanoma, NOS Tumor-Infiltrating Lymphocytes: ? ? Present, non-brisk Tumor Regression: ? ? Not identifed Lymphovascular Invasion: ? ?  Present Microsatellitosis: ? ? Present Margins ? ? ?Peripheral Margins: ? ? Uninvolved Deep Margin: ? ? Uninvolved Stage (AJCC 8th Ed): ? ? pT4a Nx Base of the tongue and vallecula random biopsies 02/20/19: 1. ?TONGUE, LEFT, BASE, BIOPSY: ? ? ? ?- LYMPHOID TISSUE HYPERPLASIA ? ? ?- NEGATIVE FOR MALIGNANCY 2. ?THROAT, VALLECULA, BIOPSY: ? ?  ? - LYMPHOID TISSUE HYPERPLASIA AND FOCAL CHRONIC INFLAMMATION. SEE NOTE. ? ? ?- NEGATIVE FOR MALIGNANCY ? ? ? ? ? Note: Immunostain of Sox-10 to investigate focal pigment is negative, supporting above interpretation. 3. ?THROAT, LEFT VALLECULA, BIOPSY: ? ? ? ?- LYMPHOID TISSUE HYPERPLASIA ? ? ?- NEGATIVE FOR MALIGNANCY 4. ?THROAT, RIGHT VALLECULA, BIOPSY: ? ? ? ? ? ? - LYMPHOID TISSUE HYPERPLASIA AND FOCAL CHRONIC INFLAMMATION ? ? ?- NEGATIVE FOR MALIGNANCY 5. ?TONGUE, RIGHT BASE, BIOPSY: ? ? ? ? ? ? - LYMPHOID TISSUE HYPERPLASIA AND FOCAL CHRONIC INFLAMMATION ? ? ?- NEGATIVE FOR MALIGNANCY Pathology (reviewed at [facility]), collected 01/07/19:DIAGNOSIS: ? 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 Clinical pathological correlation is recommended. MICROSCOPIC DESCRIPTION: There are atypical cells in a nodule in the dermis. The cells are positive with SOX-10 and by report are negative with LCA, Kappa, Lambda, and a cytokeratin. The cells are also positive with S-100 by report.ASSESSMENT/PLAN:[name] is a 74 y.o. 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  Initially path read as metastatic melanoma, but further workup confirms that this is the primary site, not a metastatic lesion.  Initial biopsy was likely altered due to repeat I&Ds of the area.  He had a FBSE with his dermatologist and no additional primary lesions were identified.  He is otherwise in good health except for metabolic syndrome and diabetes-associated neuropathy.  PET 02/07/19 showed the main lesion on the right upper back extending to the latissimus dorsi muscle with involved right axillary nodes and nonspecific avidity in the BOT and preepiglottis for which direct visual evaluation was recommended and done 02/11/19 and random biopsies 02/20/19 without concerns for malignancy. Colonoscopy was also done to evaluate stranding in the transverse colon, which was negative for malignancy on exam.  Now s/p WLE and LND with Dr. Duanne Moron on 03/03/19 showing a stage IIIC melanoma that was 30 mm thick, positive for LVI and microsatellites with 3/25 positive LNs.  Given the thickness of his primary melanoma and microsatellitosis, we felt that his melanoma-specific survival is closer to a stage IIID:  60% over 10 years with stage IIIC disease versus 24% with stage IIID.  He does not have a BRAF mutation, so SOC options are immune therapy at this point versus the Moderna clinical trial (?A Phase 2 Randomized Study of Adjuvant Immunotherapy with the Personalized Cancer Vaccine [phone] and Pembrolizumab Versus Pembrolizumab Alone After Complete Resection of High-Risk Melanoma?) vs active surveillance (for which he would be extremely high risk for relapsing).  He opted for the Moderna trial and signed consent on 03/27/19; tissue passed QC inspection.  Jason Rowland was randomized to the [facility] with vaccine arm of the trial.  He has experienced some weight loss due to loss of taste/smell/appetite after initiation the clinical trial, which is now resolved with weight gain. He has stable right arm lymphedema but declines any lymphedema therapy or sleeves.  He also had some G2 diarrhea after C4, which resolved with Imodium  and not requiring anti-diarrheals by C7.  Energy is improved after starting an exercise regimen.- labs reviewed today.  LDH wnl.  LFTs wnl.  Bilis are normal.  TSH still pending.  Ok for treatment C14 today. Will get pembrolizumab only.- [place] CAP 7/12 NED; [place] CAP 01/12/20 showing a non-specific 1.1 cm left subpectoral LN likely related to COVID-19 vaccination (no longer palpable today), no definitive evidence of metastatic disease; repeat surveillance scans every 12 weeks (due in Dec)- routine f/u with dermatologist every 4-6 months.  Last seen 12/23/19.  Has another appt coming up soon.- RTC in 3 weeks per trial schedule; instructed to call if any issues or questions in the interim20 min were spent in direct patient contact.  He has our clinic contact information and instructed to call if questions.  Drema Halon, MD, PhDMedical Oncology

## 2020-02-03 NOTE — Progress Notes
Patient arrived in the clinic for D1C14 [id number] mRNA-4157 and Pembrolizumab - Pembrolizumab only today.PIV placed and labs collected on NP8.Seen and cleared by Dr. Laveda Norman.Research clearance provided by Zeb Comfort, CRC.5 unit insulin lispro given for blood glucose of 375 as ordered by Dr. Laveda Norman.Pre dose PK drawn.Pembrolizumab infused from 14:00-14:30 without incident.Line flushed with saline and removed upon completion.Repeat BS = 332 - reported to Dr. Laveda Norman and ok'd to d/c to home.Patient discharged to home in stable condition.He will RTC 11/09 for his next possible treatment.

## 2020-02-04 NOTE — Progress Notes
HIC # [id number] Cycle 14, Day 1 Patient seen and examined by Dr. Romona Curls and toxicity assessment reviewed by Dr. Lucianne Muss labs are deemed NCS today, Concomitant medications reviewed and updated.Calendar was reviewed with patient and they will RTC per protocolApproved for treatment today per Dr. Laveda Norman.   ECOG PS = 1  Concomitant medications: MEDICATION SINGLE DOSE UNITS ROUTE FREQUECY START DATE STOP DATE INDICATION Aspirin 81 mg PO QD 2010 ongoing PPX CVD Simvastatin 20 Mg PO QD 2012 ongoing Hypercholesterolemia  Ambien 10 mg PO QD 2016 ongoing insomnia  Glucoside 10 mg PO QD 2014 ongoing diabetes  Toujeo Solostar  80 Units SQ QD  2016  07/08/19 diabetes  guaifenesin 400 mg PO q 4 hours prn 07/10/19 08/16/19 cough  imodium 2 Mg PO PRN 07/19/19 ongoing diarrhea Toujeo Solostar 40-50 units subcutaneous QD 07/09/19 10/01/19 diabetes  Toujeo Solostar 60 units SQ QD 10/02/19  01/15/20 diabetes Cefalexin 250 Mg Oral TID 12/24/19 01/02/20 PPX Infection  Moderna COVID Booster 1  Injection IM Once 01/10/20 01/10/20 PPX COVID-19  Toujeo Solostar  70 Units subcutaneous  QD  01/15/20 Ongoing Diabetes Lispro 5 Units SQ Once 02/03/20 02/03/20 diabetes   Medical History:                                                         PATIENT NAME: Jason Rowland  STUDY ID:  [id number]     0=NA                      1= Unrelated         2= Unlikely 3=Possible         4= Probable           5. Definite (Circle One) 0= No Action Taken                                         1= Con Med                                                            2=Dose Modified                                        3=Dose Delayed                                                         4= Patient Hospitalized                                      5= Patient Taken Off Study  6=Non Drug Therapy                                        7=Other (Specify) 1=Recovered 2=Still under txmt/observation                              3=Alive w/Sequelae                                                                 4=Died                         5=Resolvd to Baseline             6=Grade Change       Medical History                       ADVERSE EVENT  Is AE Intermittent? Y/N SAE             Y/N Grade Expect PEMBRO Y/N ?Relation to [facility] Expect mRNA            Y/N ?Relation to mRNA Start Date End Date/ or ?Ongoing Action Taken               Designer, jewellery # above) AE OUTCOME   Hypercholesterolemia N N 1 Med Hx Med Hx Med Hx Med Hx 2012 ongoing 1= simvastatin 2   diabetes N N 1 Med Hx Med Hx Med Hx Med Hx 2010 ongoing 1- glucoside; toujeo 2   Insomnia N N 1 Med Hx Med Hx Med Hx Med Hx 2016 ongoing 1- ambien 2   Edema limbs N N 1 Med Hx  Med Hx Med Hx Med Hx  03/03/2019 ongoing 0 2   Diarrhea  N N 1 Y  1  N/A  N/A  05/27/2019 05/27/2019 0 5   fatigue   N N  1   Y 3   Y 3   06/26/19  ongoing  0 2    dysgeusia N N 1 N 2 N 2 06/22/19 08/21/19 0 2   anorexia N N 1 Y 2 Y 2 06/22/19 08/21/19 0 2   Pain, injection site Y N 1 N 1 Y 4 06/19/19 06/23/19 0 2   Eye disorders, other (left eye droop) N N 1 N 2 N 2 07/01/19 07/31/19 0 2   vomiting N N 1 Y 3 Y 3 07/08/19 07/08/19 0 1   dirrahea Y N 1 Y 3 Y 3 07/08/19 07/08/19 0 2   cough N N 1 Y 3 Y 3 07/08/19 08/16/19 1 = gueifenisen 2   ALT increased N N 1 Y 3 Y 3 07/10/19 07/31/19 0 2   AST increased  N N 1 Y 3 Y 3 07/10/19 07/31/19 0 2   Alkaline phos increased N N 2 Y 3 Y 3 07/10/19 07/31/19 0 2   Diarrhea  Y N 2 Y 3 Y 3 07/09/19 07/20/2019 1- imodium 1   Injection site pain Y N 1 N 1 Y 3 07/08/19 12/25/19 0 2   bloating  Y N 1 N 1 N 1 07/08/19 ongoing 0 2   Bilateral foot neuropathy N N 1 N/A N/A N/A N/A Baseline 02/2019 ongoing 0 2   Dyspnea on exertion Y N 1 N/A N/A N/A N/A Baseline 02/2019 ongoing 0 2   Diarrhea  N N 1 Y 1 Y 1 09/25/19  09/25/19 1- imodium 1   Anorexia  N N 1 Y 2 Y 2 10/02/19  11/13/19 0 2   Diarrhea  N N 1 Y 1 Y 1 10/16/19 10/17/19 0 1   diarrhea  N N 1 Y 1 Y 1 11/14/19  11/15/19 0 1   diarrhea  N N 1 Y 1 Y 1 01/10/20  01/12/20 0 1                                                                                    Labs:Results for Jason Rowland, Jason Rowland (MRN [id number])  Ref. Range 02/03/2020 11:17 02/03/2020 14:37 Sodium Latest Ref Range: 136 - 144 mmol/L 138  Potassium Latest Ref Range: 3.3 - 5.3 mmol/L 4.5  Chloride Latest Ref Range: 98 - 107 mmol/L 99  CO2 Latest Ref Range: 20 - 30 mmol/L 28  Anion Gap Latest Ref Range: 7 - 17  11  BUN Latest Ref Range: 8 - 23 mg/dL 20  Creatinine Latest Ref Range: 0.40 - 1.30 mg/dL 4.09  BUN/Creatinine Ratio Latest Ref Range: 8.0 - 23.0  26.3 (H)  eGFR (NON African-American) Latest Ref Range: >60 mL/min/1.39m2 >60  eGFR (Afr Amer) Latest Ref Range: >60 mL/min/1.36m2 >60  Glucose Latest Ref Range: 70 - 100 mg/dL 811 (H)  Calcium Latest Ref Range: 8.8 - 10.2 mg/dL 9.2  Magnesium Latest Ref Range: 1.7 - 2.4 mg/dL 1.9  Phosphorus Latest Ref Range: 2.2 - 4.5 mg/dL 3.0  Total Bilirubin Latest Ref Range: <=1.2 mg/dL 0.3  Alkaline Phosphatase Latest Ref Range: 9 - 122 U/L 83  Alanine Aminotransferase (ALT) Latest Ref Range: 9 - 59 U/L 25  Aspartate Aminotransferase (AST) Latest Ref Range: 10 - 35 U/L 23  AST/ALT Ratio Latest Ref Range: See Comment  0.9  LD Latest Ref Range: 122 - 241 U/L 176  Total Protein Latest Ref Range: 6.6 - 8.7 g/dL 8.2  Albumin Latest Ref Range: 3.6 - 4.9 g/dL 4.1  Globulin Latest Ref Range: 2.3 - 3.5 g/dL 4.1 (H)  A/G Ratio Latest Ref Range: 1.0 - 2.2  1.0  Total CK Latest Ref Range: 11 - 204 U/L 166  WBC Latest Ref Range: 4.0 - 10.0 x1000/?L 9.1  RBC Latest Ref Range: 3.8 - 5.9 M/?L 5.1  Hemoglobin Latest Ref Range: 12.0 - 18.0 g/dL 91.4  Hematocrit Latest Ref Range: 37.0 - 52.0 % 45.0  MCV Latest Ref Range: 78.0 - 94.0 fL 88.1  MCH Latest Ref Range: 27.0 - 31.0 pg 29.2  MCHC Latest Ref Range: 31.0 - 36.0 g/dL 78.2  RDW-CV Latest Ref Range: 11.5 - 14.5 % 13.5  nRBC Latest Ref Range: 0.0 - 1.0 % 0.0  Platelets Latest Ref Range: 140 - 440 x1000/?L 273  MPV Latest Ref Range: 6.0 - 11.0 fL 11.0  Neutrophils Latest Ref Range: 37.0 - 84.0 %  74.3  Lymphocytes Latest Ref Range: 8.0 - 49.0 % 15.1  Monocytes Latest Ref Range: 4.0 - 15.0 % 7.1  Eosinophils Latest Ref Range: 0.0 - 7.0 % 2.4  Basophils Latest Ref Range: 0.0 - 4.0 % 0.7  ANC (Abs Neutrophil Count) Latest Ref Range: 1.0 - 11.0 x 1000/?L 6.8  Absolute Lymphocyte Count Latest Ref Range: 1.0 - 4.0 x 1000/?L 1.4  Monocytes (Absolute) Latest Ref Range: 0.0 - 2.0 x 1000/?L 0.7  Eosinophil Absolute Count Latest Ref Range: 0.0 - 1.0 x 1000/?L 0.2  Basophils Absolute Latest Ref Range: 0.0 - 0.0 x 1000/?L 0.1 (H)  Immature Granulocytes (Abs) Latest Ref Range: 0.0 - 0.3 x 1000/?L 0.0  nRBC Absolute Latest Ref Range: 0.0 - 0.0 x 1000/?L 0.0  Immature Granulocytes Latest Ref Range: 0.0 - 3.0 % 0.4  Prothrombin Time Latest Ref Range: 9.6 - 12.3 seconds 10.8  INR Latest Ref Range: 0.87 - 1.14  0.99  PTT Latest Ref Range: 23.0 - 31.4 seconds 25.6  Glucose, Meter Latest Ref Range: 70 - 100 mg/dL  161 (H)

## 2020-02-24 ENCOUNTER — Encounter: Admit: 2020-02-24 | Payer: PRIVATE HEALTH INSURANCE

## 2020-02-24 ENCOUNTER — Inpatient Hospital Stay: Admit: 2020-02-24 | Discharge: 2020-02-24 | Payer: BLUE CROSS/BLUE SHIELD

## 2020-02-24 ENCOUNTER — Telehealth: Admit: 2020-02-24 | Payer: PRIVATE HEALTH INSURANCE

## 2020-02-24 ENCOUNTER — Ambulatory Visit: Admit: 2020-02-24 | Payer: BLUE CROSS/BLUE SHIELD

## 2020-02-24 DIAGNOSIS — T8130XA Disruption of wound, unspecified, initial encounter: Secondary | ICD-10-CM

## 2020-02-24 DIAGNOSIS — D125 Benign neoplasm of sigmoid colon: Secondary | ICD-10-CM

## 2020-02-24 DIAGNOSIS — I890 Lymphedema, not elsewhere classified: Secondary | ICD-10-CM

## 2020-02-24 DIAGNOSIS — Z5112 Encounter for antineoplastic immunotherapy: Secondary | ICD-10-CM

## 2020-02-24 DIAGNOSIS — Z006 Encounter for examination for normal comparison and control in clinical research program: Secondary | ICD-10-CM

## 2020-02-24 DIAGNOSIS — Z794 Long term (current) use of insulin: Secondary | ICD-10-CM

## 2020-02-24 DIAGNOSIS — C4359 Malignant melanoma of other part of trunk: Secondary | ICD-10-CM

## 2020-02-24 DIAGNOSIS — R430 Anosmia: Secondary | ICD-10-CM

## 2020-02-24 DIAGNOSIS — E785 Hyperlipidemia, unspecified: Secondary | ICD-10-CM

## 2020-02-24 DIAGNOSIS — E8881 Metabolic syndrome: Secondary | ICD-10-CM

## 2020-02-24 DIAGNOSIS — R432 Parageusia: Secondary | ICD-10-CM

## 2020-02-24 DIAGNOSIS — Z79899 Other long term (current) drug therapy: Secondary | ICD-10-CM

## 2020-02-24 DIAGNOSIS — E7800 Pure hypercholesterolemia, unspecified: Secondary | ICD-10-CM

## 2020-02-24 DIAGNOSIS — Z791 Long term (current) use of non-steroidal anti-inflammatories (NSAID): Secondary | ICD-10-CM

## 2020-02-24 DIAGNOSIS — E1149 Type 2 diabetes mellitus with other diabetic neurological complication: Secondary | ICD-10-CM

## 2020-02-24 DIAGNOSIS — L409 Psoriasis, unspecified: Secondary | ICD-10-CM

## 2020-02-24 DIAGNOSIS — R059 Cough: Secondary | ICD-10-CM

## 2020-02-24 DIAGNOSIS — E1142 Type 2 diabetes mellitus with diabetic polyneuropathy: Secondary | ICD-10-CM

## 2020-02-24 DIAGNOSIS — Z7982 Long term (current) use of aspirin: Secondary | ICD-10-CM

## 2020-02-24 DIAGNOSIS — R197 Diarrhea, unspecified: Secondary | ICD-10-CM

## 2020-02-24 DIAGNOSIS — Z85820 Personal history of malignant melanoma of skin: Secondary | ICD-10-CM

## 2020-02-24 DIAGNOSIS — Z955 Presence of coronary angioplasty implant and graft: Secondary | ICD-10-CM

## 2020-02-24 DIAGNOSIS — E119 Type 2 diabetes mellitus without complications: Secondary | ICD-10-CM

## 2020-02-24 DIAGNOSIS — R630 Anorexia: Secondary | ICD-10-CM

## 2020-02-24 LAB — COMPREHENSIVE METABOLIC PANEL
BKR A/G RATIO: 1 (ref 1.0–2.2)
BKR A/G RATIO: 1.1 (ref 1.0–2.2)
BKR ALANINE AMINOTRANSFERASE (ALT): 33 U/L (ref 9–59)
BKR ALANINE AMINOTRANSFERASE (ALT): 27 U/L (ref 9–59)
BKR ALBUMIN: 4.2 g/dL (ref 3.6–4.9)
BKR ALBUMIN: 3.7 g/dL (ref 3.6–4.9)
BKR ALKALINE PHOSPHATASE: 75 U/L (ref 9–122)
BKR ALKALINE PHOSPHATASE: 67 U/L (ref 9–122)
BKR ANION GAP: 10 (ref 7–17)
BKR ANION GAP: 8 (ref 7–17)
BKR ASPARTATE AMINOTRANSFERASE (AST): 6.5 % (ref 4.0–15.0)
BKR ASPARTATE AMINOTRANSFERASE (AST): 29 U/L (ref 10–35)
BKR AST/ALT RATIO: 1.1
BKR BILIRUBIN TOTAL: 0.5 mg/dL (ref <=1.2)
BKR BILIRUBIN TOTAL: 0.4 mg/dL (ref <=1.2)
BKR BLOOD UREA NITROGEN: 24 mg/dL — ABNORMAL HIGH (ref 8–23)
BKR BLOOD UREA NITROGEN: 24 mg/dL — ABNORMAL HIGH (ref 8–23)
BKR BUN / CREAT RATIO: 30.4 EU/dL — ABNORMAL HIGH (ref 8.0–23.0)
BKR BUN / CREAT RATIO: 25.8 — ABNORMAL HIGH (ref 8.0–23.0)
BKR CALCIUM: 9.4 mg/dL (ref 8.8–10.2)
BKR CALCIUM: 9 mg/dL (ref 8.8–10.2)
BKR CHLORIDE: 99 mmol/L (ref 98–107)
BKR CHLORIDE: 100 mmol/L (ref 98–107)
BKR CO2: 27 mmol/L (ref 20–30)
BKR CO2: 29 mmol/L (ref 20–30)
BKR CREATININE: 0.79 mg/dL (ref 0.40–1.30)
BKR CREATININE: 0.93 mg/dL (ref 0.40–1.30)
BKR EGFR (AFR AMER): >60 mL/min/{1.73_m2} (ref >60)
BKR EGFR (AFR AMER): >60 mL/min/{1.73_m2} (ref >60)
BKR EGFR (NON AFRICAN AMERICAN): >60 mL/min/1.73m2 — ABNORMAL HIGH (ref >60)
BKR EGFR (NON AFRICAN AMERICAN): >60 mL/min/{1.73_m2} (ref >60)
BKR GLOBULIN: 4.1 g/dL — ABNORMAL HIGH (ref 2.3–3.5)
BKR GLOBULIN: 3.5 g/dL (ref 2.3–3.5)
BKR GLUCOSE: 199 mg/dL — ABNORMAL HIGH (ref 70–100)
BKR GLUCOSE: 224 mg/dL — ABNORMAL HIGH (ref 70–100)
BKR POTASSIUM: 6 (ref 5.5–7.5)
BKR POTASSIUM: 4.2 mmol/L (ref 3.3–5.3)
BKR PROTEIN TOTAL: 8.3 g/dL (ref 6.6–8.7)
BKR PROTEIN TOTAL: 7.2 g/dL (ref 6.6–8.7)
BKR SODIUM: 136 mmol/L — ABNORMAL HIGH (ref 136–144)
BKR SODIUM: 137 mmol/L (ref 136–144)

## 2020-02-24 LAB — CBC WITH AUTO DIFFERENTIAL
BKR WAM ABSOLUTE IMMATURE GRANULOCYTES: 0 x 1000/??L (ref 0.0–0.3)
BKR WAM ABSOLUTE LYMPHOCYTE COUNT: 1.7 x 1000/??L (ref 1.0–4.0)
BKR WAM ABSOLUTE NRBC: 0 x 1000/??L (ref 0.0–0.0)
BKR WAM ANALYZER ANC: 6.9 x 1000/ÂµL (ref 1.0–11.0)
BKR WAM BASOPHIL ABSOLUTE COUNT: 0.1 x 1000/ÂµL — ABNORMAL HIGH (ref 0.0–0.0)
BKR WAM BASOPHILS: 0.5 % (ref 0.0–4.0)
BKR WAM EOSINOPHIL ABSOLUTE COUNT: 0.2 x 1000/??L (ref 0.0–1.0)
BKR WAM EOSINOPHILS: 2 % (ref 0.0–7.0)
BKR WAM HEMATOCRIT: 46.5 % (ref 37.0–52.0)
BKR WAM HEMOGLOBIN: 15.3 g/dL (ref 12.0–18.0)
BKR WAM IMMATURE GRANULOCYTES: 0.3 % (ref 0.0–3.0)
BKR WAM LYMPHOCYTES: 18 % (ref 8.0–49.0)
BKR WAM MCH (PG): 29.3 pg (ref 27.0–31.0)
BKR WAM MCHC: 32.9 g/dL (ref 31.0–36.0)
BKR WAM MCV: 88.9 fL — ABNORMAL HIGH (ref 78.0–94.0)
BKR WAM MONOCYTE ABSOLUTE COUNT: 0.6 x 1000/??L — ABNORMAL HIGH (ref 0.0–2.0)
BKR WAM MONOCYTES: 6.5 % (ref 4.0–15.0)
BKR WAM MPV: 10.9 fL — AB (ref 6.0–11.0)
BKR WAM NEUTROPHILS: 72.7 % (ref 37.0–84.0)
BKR WAM NUCLEATED RED BLOOD CELLS: 0 % (ref 0.0–1.0)
BKR WAM PLATELETS: 292 x1000/ÂµL (ref 140–440)
BKR WAM RDW-CV: 13.6 % (ref 11.5–14.5)
BKR WAM RED BLOOD CELL COUNT: 5.2 M/ÂµL (ref 3.8–5.9)
BKR WAM WHITE BLOOD CELL COUNT: 9.5 x1000/ÂµL (ref 4.0–10.0)

## 2020-02-24 LAB — URINALYSIS-MACROSCOPIC W/REFLEX MICROSCOPIC
BKR BILIRUBIN, UA: NEGATIVE
BKR KETONES, UA: NEGATIVE
BKR LEUKOCYTE ESTERASE, UA: NEGATIVE
BKR NITRITE, UA: NEGATIVE
BKR PH, UA: 6 (ref 5.5–7.5)
BKR SPECIFIC GRAVITY, UA: 1.024 — ABNORMAL HIGH (ref 1.005–1.020)
BKR UROBILINOGEN, UA: 0.2 EU/dL (ref <=2.0)

## 2020-02-24 LAB — TSH: BKR THYROID STIMULATING HORMONE: 3.66 ??IU/mL

## 2020-02-24 LAB — MAGNESIUM: BKR MAGNESIUM: 1.6 mg/dL — ABNORMAL LOW (ref 1.7–2.4)

## 2020-02-24 LAB — PHOSPHORUS     (BH GH L LMW YH): BKR PHOSPHORUS: 3.6 mg/dL (ref 2.2–4.5)

## 2020-02-24 LAB — URINE MICROSCOPIC     (BH GH LMW YH)

## 2020-02-24 LAB — T3: BKR T3 TOTAL: 86.5 ng/dL

## 2020-02-24 LAB — T4, FREE: BKR FREE T4: 0.91 ng/dL

## 2020-02-24 LAB — LACTATE DEHYDROGENASE: BKR LACTATE DEHYDROGENASE: 156 U/L (ref 122–241)

## 2020-02-24 MED ORDER — ALBUTEROL SULFATE 2.5 MG/3 ML (0.083 %) SOLUTION FOR NEBULIZATION
2.5 mg /3 mL (0.083 %) | RESPIRATORY_TRACT | Status: DC | PRN
Start: 2020-02-24 — End: 2020-02-29

## 2020-02-24 MED ORDER — DIPHENHYDRAMINE 50 MG/ML INJECTION SOLUTION
50 mg/mL | Freq: Once | INTRAVENOUS | Status: DC | PRN
Start: 2020-02-24 — End: 2020-02-29

## 2020-02-24 MED ORDER — HYDROCORTISONE SODIUM SUCCINATE 100 MG SOLUTION FOR INJECTION
100 mg | Freq: Once | INTRAVENOUS | Status: DC | PRN
Start: 2020-02-24 — End: 2020-02-29

## 2020-02-24 MED ORDER — EPINEPHRINE 0.3 MG/0.3 ML INJECTION, AUTO-INJECTOR
0.3 mg/ mL | INTRAMUSCULAR | Status: DC | PRN
Start: 2020-02-24 — End: 2020-02-29

## 2020-02-24 MED ORDER — SODIUM CHLORIDE 0.9 % BOLUS (NEW BAG)
0.9 % | Freq: Once | INTRAVENOUS | Status: DC | PRN
Start: 2020-02-24 — End: 2020-02-29

## 2020-02-24 MED ORDER — FAMOTIDINE 4 MG/ML IN STERILE WATER (ADULT)
Freq: Once | INTRAVENOUS | Status: DC | PRN
Start: 2020-02-24 — End: 2020-02-29

## 2020-02-24 MED ORDER — HIC 2000025461 PEMBROLIZUMAB (MK-3475)
Freq: Once | INTRAVENOUS | Status: CP
Start: 2020-02-24 — End: ?
  Administered 2020-02-24: 18:00:00 100.000 mL/h via INTRAVENOUS

## 2020-02-24 MED ORDER — MEPERIDINE 25 MG/2.5 ML IN 0.9% SODIUM CHLORIDE
Freq: Once | INTRAVENOUS | Status: DC | PRN
Start: 2020-02-24 — End: 2020-02-29

## 2020-02-24 NOTE — Progress Notes
Willson presents to clinic for [id number] [id number] + Pembrolizumab, on maintenance pembro now.  PIV inserted and labs drawn by RN after multiple attempts of IV access.Seen by Dr. Laveda Norman, labs reviewed.  Pt cleared for treatment by Dr. Laveda Norman and Jayme Cloud from research.Nursing assessment completed in flowsheets.Pembrolizumab ([id number]) 200 mg in 100 mL infused over 30 min without incident (1230-1300).PIV flushed per protocol and removed at completion.Gauze placed on site and wrapped with coban.Pt discharged home in stable condition in care of self.RTC 11/30 for next treatment.

## 2020-02-24 NOTE — Progress Notes
HIC # [id number] Cycle 15, Day 1 Patient seen and examined by Drema Halon, MDLabs and toxicity assessment reviewed by Drema Halon, MDAll labs are deemed NCS today per Drema Halon, MDCMP redrawn due to some labs having hemolyzed.Concomitant medications reviewed and updated.Calendar was reviewed with patient and they will RTC per protocolApproved for treatment today per Drema Halon, MD  ECOG PS = 1  Concomitant medications: MEDICATION SINGLE DOSE UNITS ROUTE FREQUECY START DATE STOP DATE INDICATION Aspirin 81 mg PO QD 2010 ongoing PPX CVD Simvastatin 20 Mg PO QD 2012 ongoing Hypercholesterolemia  Ambien 10 mg PO QD 2016 ongoing insomnia  Glucoside 10 mg PO QD 2014 ongoing diabetes  Toujeo Solostar  80 Units SQ QD  2016  07/08/19 diabetes  guaifenesin 400 mg PO q 4 hours prn 07/10/19 08/16/19 cough  imodium 2 Mg PO PRN 07/19/19 ongoing diarrhea Toujeo Solostar 40-50 units subcutaneous QD 07/09/19 10/01/19 diabetes  Toujeo Solostar 60 units SQ QD 10/02/19  01/15/20 diabetes Cefalexin 250 Mg Oral TID 12/24/19 01/02/20 PPX Infection  Moderna COVID Booster 1  Injection IM Once 01/10/20 01/10/20 PPX COVID-19  Toujeo Solostar  70 Units subcutaneous  QD  01/15/20 Ongoing Diabetes Lispro 5 Units SQ Once 02/03/20 02/03/20 diabetes   Medical History:                                                         PATIENT NAME: Jason Rowland  STUDY ID:  [id number]     0=NA                      1= Unrelated         2= Unlikely 3=Possible         4= Probable           5. Definite (Circle One) 0= No Action Taken                                         1= Con Med                                                            2=Dose Modified                                        3=Dose Delayed                                                         4= Patient Hospitalized                                      5= Patient Taken Off Study  6=Non Drug Therapy 7=Other (Specify) 1=Recovered                       2=Still under txmt/observation                              3=Alive w/Sequelae                                                                 4=Died                         5=Resolvd to Baseline             6=Grade Change       Medical History                       ADVERSE EVENT  Is AE Intermittent? Y/N SAE             Y/N Grade Expect PEMBRO Y/N ?Relation to [facility] Expect mRNA            Y/N ?Relation to mRNA Start Date End Date/ or ?Ongoing Action Taken               Designer, jewellery # above) AE OUTCOME   Hypercholesterolemia N N 1 Med Hx Med Hx Med Hx Med Hx 2012 ongoing 1= simvastatin 2   diabetes N N 1 Med Hx Med Hx Med Hx Med Hx 2010 ongoing 1- glucoside; toujeo 2   Insomnia N N 1 Med Hx Med Hx Med Hx Med Hx 2016 ongoing 1- ambien 2   Edema limbs N N 1 Med Hx  Med Hx Med Hx Med Hx  03/03/2019 ongoing 0 2   Diarrhea  N N 1 Y  1  N/A  N/A  05/27/2019 05/27/2019 0 5   fatigue   N N  1   Y 3   Y 3   06/26/19  ongoing  0 2    dysgeusia N N 1 N 2 N 2 06/22/19 08/21/19 0 2   anorexia N N 1 Y 2 Y 2 06/22/19 08/21/19 0 2   Pain, injection site Y N 1 N 1 Y 4 06/19/19 06/23/19 0 2   Eye disorders, other (left eye droop) N N 1 N 2 N 2 07/01/19 07/31/19 0 2   vomiting N N 1 Y 3 Y 3 07/08/19 07/08/19 0 1   dirrahea Y N 1 Y 3 Y 3 07/08/19 07/08/19 0 2   cough N N 1 Y 3 Y 3 07/08/19 08/16/19 1 = gueifenisen 2   ALT increased N N 1 Y 3 Y 3 07/10/19 07/31/19 0 2   AST increased  N N 1 Y 3 Y 3 07/10/19 07/31/19 0 2   Alkaline phos increased N N 2 Y 3 Y 3 07/10/19 07/31/19 0 2   Diarrhea  Y N 2 Y 3 Y 3 07/09/19 07/20/2019 1- imodium 1   Injection site pain Y N 1 N 1 Y 3 07/08/19 12/25/19 0 2   bloating Y N 1 N 1 N 1 07/08/19 ongoing 0 2   Bilateral foot neuropathy N  N 1 N/A N/A N/A N/A Baseline 02/2019 ongoing 0 2   Dyspnea on exertion Y N 1 N/A N/A N/A N/A Baseline 02/2019 ongoing 0 2   Diarrhea  N N 1 Y 1 Y 1 09/25/19  09/25/19 1- imodium 1   Anorexia  N N 1 Y 2 Y 2 10/02/19  11/13/19 0 2   Diarrhea  N N 1 Y 1 Y 1 10/16/19  10/17/19 0 1   diarrhea  N N 1 Y 1 Y 1 11/14/19  11/15/19 0 1   diarrhea  N N 1 Y 1 Y 1 01/10/20  01/12/20 0 1                                                                                   Labs:Results for JAICOB, DIA (MRN [id number]) as of 02/24/2020 13:26 Ref. Range 02/24/2020 10:06 02/24/2020 10:10 02/24/2020 12:25 Sodium Latest Ref Range: 136 - 144 mmol/L 136  137 Potassium Latest Ref Range: 3.3 - 5.3 mmol/L Comment Only  4.2 Chloride Latest Ref Range: 98 - 107 mmol/L 99  100 CO2 Latest Ref Range: 20 - 30 mmol/L 27  29 Anion Gap Latest Ref Range: 7 - 17  10  8  BUN Latest Ref Range: 8 - 23 mg/dL 24 (H)  24 (H) Creatinine Latest Ref Range: 0.40 - 1.30 mg/dL 0.86  5.78 BUN/Creatinine Ratio Latest Ref Range: 8.0 - 23.0  30.4 (H)  25.8 (H) eGFR (NON African-American) Latest Ref Range: >60 mL/min/1.17m2 >60  >60 eGFR (Afr Amer) Latest Ref Range: >60 mL/min/1.97m2 >60  >60 Glucose Latest Ref Range: 70 - 100 mg/dL 469 (H)  629 (H) Calcium Latest Ref Range: 8.8 - 10.2 mg/dL 9.4  9.0 Magnesium Latest Ref Range: 1.7 - 2.4 mg/dL 1.6 (L)   Phosphorus Latest Ref Range: 2.2 - 4.5 mg/dL 3.6   Total Bilirubin Latest Ref Range: <=1.2 mg/dL 0.5  0.4 Alkaline Phosphatase Latest Ref Range: 9 - 122 U/L 75  67 Alanine Aminotransferase (ALT) Latest Ref Range: 9 - 59 U/L 33  27 Aspartate Aminotransferase (AST) Latest Ref Range: 10 - 35 U/L Comment Only  29 AST/ALT Ratio Latest Ref Range: See Comment  Comment Only  1.1 LD Latest Ref Range: 122 - 241 U/L   156 Total Protein Latest Ref Range: 6.6 - 8.7 g/dL 8.3  7.2 Albumin Latest Ref Range: 3.6 - 4.9 g/dL 4.2  3.7 Globulin Latest Ref Range: 2.3 - 3.5 g/dL 4.1 (H)  3.5 A/G Ratio Latest Ref Range: 1.0 - 2.2  1.0  1.1 WBC Latest Ref Range: 4.0 - 10.0 x1000/?L 9.5   RBC Latest Ref Range: 3.8 - 5.9 M/?L 5.2   Hemoglobin Latest Ref Range: 12.0 - 18.0 g/dL 52.8 Hematocrit Latest Ref Range: 37.0 - 52.0 % 46.5   MCV Latest Ref Range: 78.0 - 94.0 fL 88.9   MCH Latest Ref Range: 27.0 - 31.0 pg 29.3   MCHC Latest Ref Range: 31.0 - 36.0 g/dL 41.3   RDW-CV Latest Ref Range: 11.5 - 14.5 % 13.6   Platelets Latest Ref Range: 140 - 440 x1000/?L 292   MPV Latest Ref Range: 6.0 - 11.0 fL 10.9  Neutrophils Latest Ref Range: 37.0 - 84.0 % 72.7   Lymphocytes Latest Ref Range: 8.0 - 49.0 % 18.0   Monocytes Latest Ref Range: 4.0 - 15.0 % 6.5   Eosinophils Latest Ref Range: 0.0 - 7.0 % 2.0   Basophils Latest Ref Range: 0.0 - 4.0 % 0.5   Immature Granulocytes Latest Ref Range: 0.0 - 3.0 % 0.3   nRBC Latest Ref Range: 0.0 - 1.0 % 0.0   ANC (Abs Neutrophil Count) Latest Ref Range: 1.0 - 11.0 x 1000/?L 6.9   Absolute Lymphocyte Count Latest Ref Range: 1.0 - 4.0 x 1000/?L 1.7   Monocytes (Absolute) Latest Ref Range: 0.0 - 2.0 x 1000/?L 0.6   Eosinophil Absolute Count Latest Ref Range: 0.0 - 1.0 x 1000/?L 0.2   Basophils Absolute Latest Ref Range: 0.0 - 0.0 x 1000/?L 0.1 (H)   Immature Granulocytes (Abs) Latest Ref Range: 0.0 - 0.3 x 1000/?L 0.0   nRBC Absolute Latest Ref Range: 0.0 - 0.0 x 1000/?L 0.0   Clarity, UA Latest Ref Range: Clear   Clear  Specific Gravity, UA Latest Ref Range: 1.005 - 1.020   1.024 (H)  pH, UA Latest Ref Range: 5.5 - 7.5   6.0  Protein, UA Latest Ref Range: Negative-Trace   2+ (A)  Glucose, UA Latest Ref Range: Negative   Trace  Ketones, UA Latest Ref Range: Negative   Negative  Blood, UA Latest Ref Range: Negative   Small (A)  Bilirubin, UA Latest Ref Range: Negative   Negative  Leukocytes, UA Latest Ref Range: Negative   Negative  Nitrite, UA Latest Ref Range: Negative   Negative  Hyaline Casts, UA Latest Ref Range: 0 - 3 /LPF  1-3  WBC/HPF Latest Ref Range: 0 - 5 /HPF  3-5  RBC/HPF Latest Ref Range: 0 - 2 /HPF  3-5 (A)  Bacteria, UA Latest Ref Range: None-Few /HPF  Few  Color, UA Latest Ref Range: Yellow   Yellow  Urobilinogen, UA Latest Ref Range: <=2.0 EU/dL  0.2  Epithelial Cells Latest Ref Range: None-Few /LPF  Few

## 2020-02-24 NOTE — Telephone Encounter
The patients following labs have been canceled due to the samples being hemolyzed.LDPotassiumCK totalAST

## 2020-02-25 ENCOUNTER — Encounter: Admit: 2020-02-25 | Payer: PRIVATE HEALTH INSURANCE

## 2020-02-25 DIAGNOSIS — E7800 Pure hypercholesterolemia, unspecified: Secondary | ICD-10-CM

## 2020-02-25 DIAGNOSIS — E119 Type 2 diabetes mellitus without complications: Secondary | ICD-10-CM

## 2020-02-25 DIAGNOSIS — C4359 Malignant melanoma of other part of trunk: Secondary | ICD-10-CM

## 2020-02-25 NOTE — Progress Notes
NAME:  Jason Rowland (Nov 08, 1945)AGE: 74 y.o. MRN:  [id number] PROVIDER: Drema Halon, MD, PhDDATE OF SERVICE: 02/24/2020 [place] [facility] [place]  REASON FOR VISIT:  HIC 40981 treatment visitONC DX:  Invasive melanoma right mid backSTAGE:  IIIC (pT4a N3 M0); 30 mm with microsatellitosis, mitotic rate 2 per mm2.MOLECULAR PROFILING:0% tumor and immune cell PD-L1 staining50-gene panel:  DNA VARIANT DETECTED ? ? 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 ? 74% PRIOR TX:  WLE and SLNB 11/16/20ACTIVE TX:  Adjuvant Moderna clinical trial (?A Phase 2 Randomized Study of Adjuvant Immunotherapy with the Personalized Cancer Vaccine mRNA-4157 and Pembrolizumab Versus Pembrolizumab Alone After Complete Resection of High-Risk Melanoma? Protocol mRNA-[id number], HIC 19147) C1D1 05/08/19.  Randomized to the pembrolizumab and vaccine arm. Onc Hx: Jason Rowland (prefers to be called Jason Rowland) is a 74 y.o. male who worked for 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  He reports it appeared approx 10 months prior; his PCP originally thought it was a pimple.  Dr. Lorn Junes at [facility] later evaluated it and also thought it was a pimple and injected it with intralesional kenalog x 2 and oral cephalexin without improvement.  The lesion increased in size and burned whenever he leaned against anything.  Denies night sweats and weight loss.  Lucie Leather did a I&D on 01/07/19  with only slight bloody discharge, so a punch biopsy was obtain with path showing metastatic melanoma.  On 01/20/19, additional biopsies were done to try to evaluate a primary site, but we do not have this tissue.  Per patient report, these biopsies were negative. MRI brain 01/30/19 without intracranial disease.  PET done 02/07/19 showing preepiglottic FDG activity s/p ENT evaluation which was negative for any visible malignancy.  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 LDH elevated to 251 at diagnosis. His case was presented at the [facility] tumor board on 02/13/19 and consensus was to ask ENT for random biopsies of the BOT and pre-epiglottic PET-avid areas, as we do not want to miss a head and neck cancer but otherwise not felt to be related to his melanoma.  The cervical LN were not thought to be related.  He should also have gastroenterology evaluation for a PET-avid stranding lesion in the transverse colon.  Dr. Landis Gandy (ENT) performed BOT and vallecula random biopsies 02/20/19 which only showed chronic inflammation and no malignancy.  He had a colonoscopy by Dr. Linton Flemings on 02/27/19 showing only a tubular adenoma in the sigmoid colon.  Overall, it was felt that his right mid back lesion is the site of his primary disease, and the metastatic melanoma pathology interpretation may have been complicated due to repeat I&Ds and steroid injections that may have altered the architecture of the primary lesion.  In the absence of any confirmed metastatic disease, he underwent WLE and LND with Dr. Duanne Moron on 03/03/19.  Final path showed a 30 mm thick melanoma, 2 mitoses/mm2, non-brisk TILs, no tumor regression, LVI+, microsatellites+, negative margins, and 3/35 lymph nodes (largest LN 4.5 x 3 cm) were positive for melanoma with presence of lymphatic invasion without extranodal extension.He uses a cane sometimes as he has some balance issues due to neuropathy in his feet.  Otherwise, he is fully functional, active, and independent in ADLs/iADLs.  No hx of autoimmune disease.03/27/19 signed  consent for the Moderna trial; randomized to pembro and vaccine arm.  Started treatment C1D1 05/08/19.04/24/19 noted to have wound dehiscence, non-infected, healing by secondary intent.  Re-instated VNA services to assist with dressing changes and packing.  Wound completely healed by 06/20/19.10/27/19 surveillance scans NED.Interval ZO:XWRU returns for follow-up today and C15 of his clinical trial.Got a BP monitor and his values have been better.  Since his last treatment 3 weeks ago, he notes the following?issues/updates:?GI:-Diarrhea: no episodes lately.-taste changes/loss of smell and appetite (06/22/19) resolved 08/21/19 back to baseline.  Has some recurrence of sx since (09/25/19) -- still improved from March; remains resolved to baseline.-ongoing occasional gas pains Gr1 with sensation of bloating, intermittent relieved with flatus; stable?Resp:-DOE with walking long distances about a quarter of a mile; stable (pre-dated tx on trial)?Extremities:- lower extremity swelling stable and minimal?Metabolic-?long-acting?insulin dose 70 units qhs - unchanged.- fatigue?(Gr1)?since 3/11 which waxes and wanes; slightly improved with exercise. ?Has difficulty going to sleep, stable; still taking his chronic Remus Loffler (ran out of med for 3 days and had worsening of insomnia during that time; now back on med). Denies any fever. ?ECOG:  1Pain: reported as 0 today (does have hx of chronic neuropathy pain)Review of Systems:No fevers, chills, night sweats, lightheadedness, HA, CP, palpitations, wheezing, abd pain, constipation, blood in the urine or stools, dysuria.  Diabetic neuropathy in the hands and feet stable.  He has a prior hx of intermittent loose stools, chronic, without blood depending on his diet (often isolated to a specific restaurant he likes to visit with his buddies).  Positive responses on ROS as noted in interval hx.PAST MEDICAL HISTORY:Past Medical History: Diagnosis Date ? Diabetes mellitus (HC Code) (HC CODE)  ? High cholesterol  ? Malignant melanoma of torso excluding breast (HC Code) (HC CODE)  SURGICAL HISTORY:Past Surgical History: Procedure Laterality Date ? COLONOSCOPY   ? CORONARY ANGIOPLASTY WITH STENT PLACEMENT Bilateral  ? laryngoscopy with biopsy  02/20/2019 ? ROTATOR CUFF REPAIR Left 1999 ? TONSILLECTOMY    age 43 ? TOTAL HIP ARTHROPLASTY Right 2002 FAMILY HISTORY:Family History Problem Relation Age of Onset ? Heart disease Mother  ? Bladder cancer Father 8 ? Kidney cancer Sister  ? Leukemia Brother 49 ? No Known Problems Daughter  ? No Known Problems Son  SOCIAL HISTORY:Social History Socioeconomic History ? Marital status: Divorced   Spouse name: Not on file ? Number of children: Not on file ? Years of education: Not on file ? Highest education level: Not on file Occupational History ? Not on file Tobacco Use ? Smoking status: Never Smoker ? Smokeless tobacco: Never Used Vaping Use ? Vaping Use: Never used Substance and Sexual Activity ? Alcohol use: Not Currently ? Drug use: No ? Sexual activity: Not on file Other Topics Concern ? Not on file Social History Narrative  Divorced, works for [employer] in [place] will be retiring 03/17/19.  Has 1 son in [place] and 1 daughter in [place]. Llives in [place]  Social Determinants of Health Financial Resource Strain:  ? Difficulty of Paying Living Expenses:  Food Insecurity:  ? Worried About Programme researcher, broadcasting/film/video in the Last Year:  ? Barista in the Last Year:  Transportation Needs:  ? Freight forwarder (Medical):  ? Lack of Transportation (Non-Medical):  Physical Activity:  ? Days of Exercise per Week:  ? Minutes of Exercise per Session:  Stress:  ? Feeling of Stress :  Social Connections:  ? Frequency of Communication with Friends and Family:  ?  Frequency of Social Gatherings with Friends and Family:  ? Attends Religious Services:  ? Active Member of Clubs or Organizations:  ? Attends Banker Meetings:  ? Marital Status:  Intimate Partner Violence:  ? Fear of Current or Ex-Partner:  ? Emotionally Abused:  ? Physically Abused:  ? Sexually Abused:  ALLERGIES:No Known AllergiesMEDICATIONS:Current Outpatient Medications Medication Sig ? aspirin 81 MG EC tablet Take 81 mg by mouth nightly.  ? docusate sodium (COLACE) 250 mg capsule Take 1 capsule (250 mg total) by mouth 2 (two) times daily as needed for constipation. ? FREESTYLE LIBRE 14 DAY sensor kit 1 each by Other route every 14 (fourteen) days. ? glipiZIDE (GLUCOTROL XL) 10 MG 24 hr tablet Take 10 mg by mouth nightly.  ? simvastatin (ZOCOR) 20 MG tablet Take 20 mg by mouth nightly.  ? TOUJEO SOLOSTAR U-300 300 unit/mL (1.5 mL) pen Inject 30 Units under the skin nightly. ? zolpidem (AMBIEN) 10 mg tablet nightly.. ? acetaminophen (TYLENOL) 325 mg tablet Take 3 tablets (975 mg total) by mouth every 6 (six) hours as needed (mild to moderate pain). (Patient not taking: Reported on 02/24/2020) ? ALPRAZolam (XANAX) 0.5 mg tablet 0.5 mg.  (Patient not taking: Reported on 12/25/2019) ? ibuprofen (ADVIL,MOTRIN) 200 mg tablet Take 2 tablets (400 mg total) by mouth every 6 (six) hours as needed (alternate with tylenol for mild to moderate pain). (Patient not taking: Reported on 02/24/2020) ? olopatadine (PATANOL) 0.1 % ophthalmic solution as needed (takes for eye irritation as needed).  (Patient not taking: Reported on 12/25/2019) No current facility-administered medications for this visit. Facility-Administered Medications Ordered in Other Visits Medication ? albuterol neb sol 2.5 mg/3 mL (0.083%) (PROVENTIL,VENTOLIN) ? diphenhydrAMINE (BENADRYL) injection 50 mg ? EPINEPHrine (EPI-PEN) auto-injector 0.3 mg ? EPINEPHrine (EPI-PEN) auto-injector 0.3 mg ? famotidine (PF) (PEPCID) 20 mg in water for injection, sterile 5 mL Injection ? hydrocortisone sodium succinate (Solu-CORTEF) injection 100 mg ? meperidine PF (DEMEROL) 25 mg in sodium chloride 0.9% PF 2.5 mL Injection ? sodium chloride 0.9 % (new bag) bolus 500 mL VITALS: BP (P) 136/86 (Site: r a, Position: Sitting, Cuff Size: Large)  - Pulse (!) (P) 92  - Temp (P) 97.3 ?F (36.3 ?C) (Temporal)  - Resp (P) 18  - Wt (P) 118.8 kg  - SpO2 (P) 98%  - BMI (P) 37.41 kg/m?  PHYSICAL EXAM:Gen:  NAD, pleasant HEENT:  EOMI, PEARLLN:  No cervical, supraclavicular, axillary, or inguinal LAD. CV:  No murmurs, tachy, no m/r/g Pulm:  CTA b/lAbd:  Soft, no tenderness, no guarding or rebound, no organomegaly, no ascitesExt:  WWP, 1+ symmetric edema in the ankles.  Lymphedema present in the right arm, unchanged.Neuro:   Grossly intactPsych:  Normal mood and affectSkin:  Scar on the right upper back well healed without evidence of recurrence. Small lateral skin puckering at the edges of the scar stable.  Some flaking skin/dryness in the middle portion of the scar.  Right axillary scar well healed.LABS:Results for orders placed or performed during the hospital encounter of 02/24/20 MAGNESIUM Result Value Ref Range  Magnesium 1.6 (L) 1.7 - 2.4 mg/dL Phosphorus     (BH GH L LMW YH) Result Value Ref Range  Phosphorus 3.6 2.2 - 4.5 mg/dL T3 Result Value Ref Range  T3, Total 86.5 See Comment ng/dL T4, free Result Value Ref Range  Free T4 0.91 See Comment ng/dL TSH Result Value Ref Range  Thyroid Stimulating Hormone 3.660 See Comment ?IU/mL Urinalysis-macroscopic w/reflex microscopic Result Value Ref  Range  Clarity, UA Clear Clear  Color, UA Yellow Yellow  Specific Gravity, UA 1.024 (H) 1.005 - 1.020  pH, UA 6.0 5.5 - 7.5  Protein, UA 2+ (A) Negative-Trace  Glucose, UA Trace Negative  Blood, UA Small (A) Negative  Bilirubin, UA Negative Negative  Ketones, UA Negative Negative  Leukocytes, UA Negative Negative  Nitrite, UA Negative Negative  Urobilinogen, UA 0.2 <=2.0 EU/dL Comprehensive metabolic panel Result Value Ref Range  Sodium 136 136 - 144 mmol/L  Potassium    Chloride 99 98 - 107 mmol/L  CO2 27 20 - 30 mmol/L  Anion Gap 10 7 - 17  Glucose 199 (H) 70 - 100 mg/dL  BUN 24 (H) 8 - 23 mg/dL  Creatinine 1.61 0.96 - 1.30 mg/dL  Calcium 9.4 8.8 - 04.5 mg/dL  BUN/Creatinine Ratio 40.9 (H) 8.0 - 23.0  Total Protein 8.3 6.6 - 8.7 g/dL  Albumin 4.2 3.6 - 4.9 g/dL  Total Bilirubin 0.5 <=8.1 mg/dL  Alkaline Phosphatase 75 9 - 122 U/L  Alanine Aminotransferase (ALT) 33 9 - 59 U/L  Aspartate Aminotransferase (AST)    Globulin 4.1 (H) 2.3 - 3.5 g/dL  A/G Ratio 1.0 1.0 - 2.2  AST/ALT Ratio    eGFR (Afr Amer) >60 >60 mL/min/1.5m2  eGFR (NON African-American) >60 >60 mL/min/1.55m2 CBC auto differential Result Value Ref Range  WBC 9.5 4.0 - 10.0 x1000/?L  RBC 5.2 3.8 - 5.9 M/?L  Hemoglobin 15.3 12.0 - 18.0 g/dL  Hematocrit 19.1 47.8 - 52.0 %  MCV 88.9 78.0 - 94.0 fL  MCHC 32.9 31.0 - 36.0 g/dL  RDW-CV 29.5 62.1 - 30.8 %  Platelets 292 140 - 440 x1000/?L  MPV 10.9 6.0 - 11.0 fL  ANC (Abs Neutrophil Count) 6.9 1.0 - 11.0 x 1000/?L  Neutrophils 72.7 37.0 - 84.0 %  Lymphocytes 18.0 8.0 - 49.0 %  Absolute Lymphocyte Count 1.7 1.0 - 4.0 x 1000/?L  Monocytes 6.5 4.0 - 15.0 %  Monocyte Absolute Count 0.6 0.0 - 2.0 x 1000/?L  Eosinophils 2.0 0.0 - 7.0 %  Eosinophil Absolute Count 0.2 0.0 - 1.0 x 1000/?L  Basophil 0.5 0.0 - 4.0 %  Basophil Absolute Count 0.1 (H) 0.0 - 0.0 x 1000/?L  Immature Granulocytes 0.3 0.0 - 3.0 %  Absolute Immature Granulocyte Count 0.0 0.0 - 0.3 x 1000/?L  nRBC 0.0 0.0 - 1.0 %  Absolute nRBC 0.0 0.0 - 0.0 x 1000/?L  MCH 29.3 27.0 - 31.0 pg Urine microscopic     (BH GH LMW YH) Result Value Ref Range  Hyaline Casts, UA 1-3 0 - 3 /LPF  Epithelial Cells Few None-Few /LPF  WBC/HPF 3-5 0 - 5 /HPF  RBC/HPF 3-5 (A) 0 - 2 /HPF  Bacteria, UA Few None-Few /HPF Lactate dehydrogenase Result Value Ref Range  LD 156 122 - 241 U/L Comprehensive metabolic panel Result Value Ref Range  Sodium 137 136 - 144 mmol/L  Potassium 4.2 3.3 - 5.3 mmol/L  Chloride 100 98 - 107 mmol/L  CO2 29 20 - 30 mmol/L  Anion Gap 8 7 - 17  Glucose 224 (H) 70 - 100 mg/dL  BUN 24 (H) 8 - 23 mg/dL  Creatinine 6.57 8.46 - 1.30 mg/dL  Calcium 9.0 8.8 - 96.2 mg/dL  BUN/Creatinine Ratio 95.2 (H) 8.0 - 23.0  Total Protein 7.2 6.6 - 8.7 g/dL  Albumin 3.7 3.6 - 4.9 g/dL  Total Bilirubin 0.4 <=8.4 mg/dL  Alkaline Phosphatase 67 9 - 122 U/L  Alanine Aminotransferase (ALT)  27 9 - 59 U/L  Aspartate Aminotransferase (AST) 29 10 - 35 U/L  Globulin 3.5 2.3 - 3.5 g/dL  A/G Ratio 1.1 1.0 - 2.2  AST/ALT Ratio 1.1 See Comment  eGFR (Afr Amer) >60 >60 mL/min/1.87m2  eGFR (NON African-American) >60 >60 mL/min/1.14m2 IMAGING/PATH:[place] abd/pelvis 01/12/20:  NEDCT chest 01/12/20:  1.  Interval increase in size of a left subpectoral lymph node as described, nonspecific but could represent metastatic disease. Otherwise no enlarging lymph nodes identified.2.  Multiple pulmonary nodules measuring up to 5 mm, stable since  [place] chest dated 04/17/2019. Follow-up as per oncologic protocol is recommended.MRI Brain and [place] CAP 04/17/19:  NEDPET 02/07/19:Head And Neck: Base of tongue and preepiglottic FDG activity is nonspecific (SUV max 6.6).Mildly FDG avid cervical lymph nodes are nonspecific, for example:*  Left level 2 ([place] image 874, 1.4 x 0.8 cm, SUV max 2.6)*  Right level 2 ([place] image 878, 1.4 x 1 cm, SUV max 2.1)CHEST:Hypermetabolic mass overlying the right posterior 10th rib is consistent with malignancy, and involves the skin, subcutaneous fat, extending to the latissimus dorsi muscle (SUV max 15, 6.3 x 3 x 5.9 cm).Two hypermetabolic right axillary lymph nodes are highly suspicious for metastases (up to SUV max 8.3, 2.4 x 1.7 cm). Multiple additional smaller right subpectoral lymph nodes are minimally FDG avid, and are indeterminate (SUV max 1.2, subcentimeter).Azygos pulmonary fissure is incidentally noted. No suspicious pulmonary nodule is identified, although pulmonary detail is partially obscured by breathing motion. Coronary arteries and the aortic valve are calcified. The heart is on the upper bound of normal size. Nonspecific pattern of myocardial FDG activity. No mediastinal lymphadenopathy is identified.Abdomen/Pelvis: Diffuse hepatic and splenic heterogeneity most likely represents artifactual image noise, however this could conceal any small lesions.Multiple colonic diverticuli show no evidence of diverticulitis. Diffuse patchy colonic FDG activity is nonspecific, commonly inflammatory or related to a pharmacologic effect, however this could conceal any small lesions (SUV max 9 the sigmoid). Left renal cortical defect associated with a punctuate calcification likely represents scarring. Right renal calyceal calcifications likely represent nonobstructing stones (up to 0.5 cm).Normal appearance of the gallbladder, spleen, stomach, pancreas, adrenal glands, small bowel, decompressed urinary bladder.Streak artifacts arising from metallic right hip prostheses obscure nearby structures.Small retroperitoneal lymph nodes are not FDG avid, although below resolution of PET. Otherwise, no retroperitoneal, mesenteric, or pelvic lymphadenopathy is identified. Inguinal lymph nodes are nonspecific (SUV max 1.5), commonly inflammatory.MUSCULOSKELETAL:Heterogeneous marrow FDG activity is likely due to a combination of artifactual image noise, and marrow activation, however this reduces sensitivity for any small lesions (SUV max 4.6 in the sacrum). Right knee FDG activity associated with cruciate ligaments and synovium is likely degenerative or posttraumatic (SUV max 5), with small knee effusion (SUV max 3.3).IMPRESSION:1.  The accession number [id number] does not have any associated images in PACS at the time of dictation. PET-[place] images associated with the accession number [id number] / [id number] were interpreted in this report.2.  Hypermetabolic mass overlying the right posterior 10th rib is consistent with malignancy, and involves the skin, subcutaneous fat, extending to the latissimus dorsi muscle.3.  Right axillary nodal metastases.4.  Multiple small right subpectoral lymph nodes are indeterminate; metastases are not excluded.5.  Base of tongue and preepiglottic FDG activity is nonspecific, with slight asymmetric fullness on the left. Direct visualization is recommended to exclude primary neoplasm.6.  Minimally FDG avid bilateral level 2 cervical lymph nodes are nonspecific, possibly inflammatory.MRI brain 01/30/19:There is no intracranial hemorrhage or major vascular distribution infarct.No abnormal enhancement is seen.There is no  mass effect, edema, or midline shift.The ventricles are symmetric and normal in size. Several scattered foci of T2/FLAIR hyperintense signal are noted in the periventricular and subcortical white matter, likely sequela of chronic small vessel ischemic disease. No extra-axial collection is seen.Prior lens replacements noted bilaterally.The paranasal sinuses are clear. Small right mastoid effusion noted.IMPRESSION:No evidence of intracranial metastatic disease.WLE and SLN 03/03/19:LYMPH NODES, RIGHT AXILLARY LEVELS 1, 2 AND 3, LYMPH NODE DISSECTION: ?  ? ? - ?THREE OF THIRTY-FIVE LYMPH NODES, POSITIVE FOR MELANOMA (3 OF 35) WITH LARGEST INVOLVED LYMPH NODE MEASURING 4.5 X 3 CM ? ? ?- LYMPHATIC INVASION IDENTIFIED ? ? ?- NO EXTRANODAL EXTENSION IDENTIFIED 1. ?SKIN, BACK, RIGHT UPPER, EXCISION: ? ? ? ?- MALIGNANT MELANOMA, SEE NOTE AND SYNOPTIC SUMMARY Note: Sections show a tumor composed of S100 positive epithelioid cells within the dermis and subcutaneous tissue. Cytokeratin AE1/AE3 and desmin are negative. A junctional component is not identified. The findings would be compatible with a primary dermal melanoma or a metastatic lesion. Clinical correlation is suggested. 2. ?SKIN, BACK, EXCISION: ? ? ? ?- BENIGN FIBROADIPOSE TISSUE AND SKELETAL MUSCLE SYNOPTIC SUMMARY MELANOMA OF THE SKIN Tumor Site: ? ? Back Laterality: ? ? Right Procedure: ? ? Primary excision Maximum Tumor Thickness (Depth): ? ? 30 mm Ulceration: ? ? Not identified Mitotic Rate (Mitoses/mm2): ? ? 2 Anatomic Level: ? ? V (Melanoma invades subcutis) Growth Phase: ? ? Vertical Histologic Type: ? ? Melanoma, NOS Tumor-Infiltrating Lymphocytes: ? ? Present, non-brisk Tumor Regression: ? ? Not identifed Lymphovascular Invasion: ? ? Present Microsatellitosis: ? ? Present Margins ? ? ?Peripheral Margins: ? ? Uninvolved Deep Margin: ? ? Uninvolved Stage (AJCC 8th Ed): ? ? pT4a Nx Base of the tongue and vallecula random biopsies 02/20/19: 1. ?TONGUE, LEFT, BASE, BIOPSY: ? ? ? ?- LYMPHOID TISSUE HYPERPLASIA ? ? ?- NEGATIVE FOR MALIGNANCY 2. ?THROAT, VALLECULA, BIOPSY: ? ?  ? - LYMPHOID TISSUE HYPERPLASIA AND FOCAL CHRONIC INFLAMMATION. SEE NOTE. ? ? ?- NEGATIVE FOR MALIGNANCY ? ? ? ? ? Note: Immunostain of Sox-10 to investigate focal pigment is negative, supporting above interpretation. 3. ?THROAT, LEFT VALLECULA, BIOPSY: ? ? ? ?- LYMPHOID TISSUE HYPERPLASIA ? ? ?- NEGATIVE FOR MALIGNANCY 4. ?THROAT, RIGHT VALLECULA, BIOPSY: ? ? ? ? ? ? - LYMPHOID TISSUE HYPERPLASIA AND FOCAL CHRONIC INFLAMMATION ? ? ?- NEGATIVE FOR MALIGNANCY 5. ?TONGUE, RIGHT BASE, BIOPSY: ? ? ? ? ? ? - LYMPHOID TISSUE HYPERPLASIA AND FOCAL CHRONIC INFLAMMATION ? ? ?- NEGATIVE FOR MALIGNANCY Pathology (reviewed at [facility]), collected 01/07/19:DIAGNOSIS: ? 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 Clinical pathological correlation is recommended. MICROSCOPIC DESCRIPTION: There are atypical cells in a nodule in the dermis. The cells are positive with SOX-10 and by report are negative with LCA, Kappa, Lambda, and a cytokeratin. The cells are also positive with S-100 by report.ASSESSMENT/PLAN:[name] is a 74 y.o. 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  Initially path read as metastatic melanoma, but further workup confirms that this is the primary site, not a metastatic lesion.  Initial biopsy was likely altered due to repeat I&Ds of the area.  He had a FBSE with his dermatologist and no additional primary lesions were identified.  He is otherwise in good health except for metabolic syndrome and diabetes-associated neuropathy.  PET 02/07/19 showed the main lesion  on the right upper back extending to the latissimus dorsi muscle with involved right axillary nodes and nonspecific avidity in the BOT and preepiglottis for which direct visual evaluation was recommended and done 02/11/19 and random biopsies 02/20/19 without concerns for malignancy. Colonoscopy was also done to evaluate stranding in the transverse colon, which was negative for malignancy on exam.  Now s/p WLE and LND with Dr. Duanne Moron on 03/03/19 showing a stage IIIC melanoma that was 30 mm thick, positive for LVI and microsatellites with 3/25 positive LNs.  Given the thickness of his primary melanoma and microsatellitosis, we felt that his melanoma-specific survival is closer to a stage IIID:  60% over 10 years with stage IIIC disease versus 24% with stage IIID.  He does not have a BRAF mutation, so SOC options are immune therapy at this point versus the Moderna clinical trial (?A Phase 2 Randomized Study of Adjuvant Immunotherapy with the Personalized Cancer Vaccine [id number] and Pembrolizumab Versus Pembrolizumab Alone After Complete Resection of High-Risk Melanoma?) vs active surveillance (for which he would be extremely high risk for relapsing).  He opted for the Moderna trial and signed consent on 03/27/19; tissue passed QC inspection.  Jason Rowland was randomized to the [facility] with vaccine arm of the trial.  He has experienced some weight loss due to loss of taste/smell/appetite after initiation the clinical trial, which is now resolved for the past several months with weight gain. He has stable right arm lymphedema but declines any lymphedema therapy or sleeves.  He also had some G2 diarrhea after C4, which resolved with Imodium and not requiring anti-diarrheals by C7.  Energy is improved after starting an exercise regimen.- labs reviewed today.  LDH wnl.  LFTs wnl.  Bilis are normal.  TSH still pending.  Ok for treatment C15 today. Will get pembrolizumab only.- [place] CAP 7/12 NED; [place] CAP 01/12/20 showing a non-specific 1.1 cm left subpectoral LN likely related to COVID-19 vaccination (no longer palpable on subsequent exms), no definitive evidence of metastatic disease; repeat surveillance scans every 12 weeks (scheduled 12/19)- routine f/u with dermatologist every 4-6 months.  Last seen 12/23/19.  Has another appt coming up soon.- RTC in 3 weeks per trial schedule; instructed to call if any issues or questions in the interim20 min were spent in direct patient contact.  He has our clinic contact information and instructed to call if questions.  Jason Halon, MD, PhDMedical Oncology

## 2020-03-09 ENCOUNTER — Encounter: Admit: 2020-03-09 | Payer: PRIVATE HEALTH INSURANCE

## 2020-03-16 ENCOUNTER — Ambulatory Visit: Admit: 2020-03-16 | Payer: BLUE CROSS/BLUE SHIELD

## 2020-03-16 ENCOUNTER — Inpatient Hospital Stay: Admit: 2020-03-16 | Discharge: 2020-03-16 | Payer: BLUE CROSS/BLUE SHIELD

## 2020-03-16 ENCOUNTER — Encounter: Admit: 2020-03-16 | Payer: PRIVATE HEALTH INSURANCE

## 2020-03-16 ENCOUNTER — Encounter: Admit: 2020-03-16 | Payer: PRIVATE HEALTH INSURANCE | Attending: Medical Oncology

## 2020-03-16 DIAGNOSIS — E1142 Type 2 diabetes mellitus with diabetic polyneuropathy: Secondary | ICD-10-CM

## 2020-03-16 DIAGNOSIS — C4359 Malignant melanoma of other part of trunk: Secondary | ICD-10-CM

## 2020-03-16 DIAGNOSIS — E7800 Pure hypercholesterolemia, unspecified: Secondary | ICD-10-CM

## 2020-03-16 DIAGNOSIS — Z7982 Long term (current) use of aspirin: Secondary | ICD-10-CM

## 2020-03-16 DIAGNOSIS — Z79899 Other long term (current) drug therapy: Secondary | ICD-10-CM

## 2020-03-16 DIAGNOSIS — E8881 Metabolic syndrome: Secondary | ICD-10-CM

## 2020-03-16 DIAGNOSIS — Z006 Encounter for examination for normal comparison and control in clinical research program: Secondary | ICD-10-CM

## 2020-03-16 DIAGNOSIS — I890 Lymphedema, not elsewhere classified: Secondary | ICD-10-CM

## 2020-03-16 DIAGNOSIS — Z85820 Personal history of malignant melanoma of skin: Secondary | ICD-10-CM

## 2020-03-16 DIAGNOSIS — Z5112 Encounter for antineoplastic immunotherapy: Secondary | ICD-10-CM

## 2020-03-16 DIAGNOSIS — E119 Type 2 diabetes mellitus without complications: Secondary | ICD-10-CM

## 2020-03-16 DIAGNOSIS — Z955 Presence of coronary angioplasty implant and graft: Secondary | ICD-10-CM

## 2020-03-16 DIAGNOSIS — Z791 Long term (current) use of non-steroidal anti-inflammatories (NSAID): Secondary | ICD-10-CM

## 2020-03-16 DIAGNOSIS — Z794 Long term (current) use of insulin: Secondary | ICD-10-CM

## 2020-03-16 DIAGNOSIS — E785 Hyperlipidemia, unspecified: Secondary | ICD-10-CM

## 2020-03-16 LAB — COMPREHENSIVE METABOLIC PANEL
BKR A/G RATIO: 1.3 x 1000/??L (ref 1.0–2.2)
BKR ALANINE AMINOTRANSFERASE (ALT): 26 U/L (ref 9–59)
BKR ALBUMIN: 4.1 g/dL (ref 3.6–4.9)
BKR ALKALINE PHOSPHATASE: 78 U/L (ref 9–122)
BKR ANION GAP: 9 (ref 7–17)
BKR AST/ALT RATIO: 1.1
BKR BILIRUBIN TOTAL: 0.3 mg/dL (ref <=1.2)
BKR BLOOD UREA NITROGEN: 23 mg/dL (ref 8–23)
BKR BUN / CREAT RATIO: 24.5 % — ABNORMAL HIGH (ref 8.0–23.0)
BKR CALCIUM: 9.3 mg/dL (ref 8.8–10.2)
BKR CHLORIDE: 102 mmol/L (ref 98–107)
BKR CO2: 27 mmol/L (ref 20–30)
BKR CREATININE: 0.94 mg/dL (ref 0.40–1.30)
BKR EGFR (AFR AMER): >60 mL/min/{1.73_m2} (ref >60)
BKR EGFR (NON AFRICAN AMERICAN): >60 mL/min/{1.73_m2} (ref >60)
BKR GLOBULIN: 3.1 g/dL (ref 2.3–3.5)
BKR GLUCOSE: 209 mg/dL — ABNORMAL HIGH (ref 70–100)
BKR POTASSIUM: 5.1 mmol/L (ref 3.3–5.3)
BKR PROTEIN TOTAL: 7.2 g/dL (ref 6.6–8.7)
BKR SARS-COV-2 RNA (COVID-19) (YH): 1.1 x 1000/??L — AB (ref 0.00–0.30)
BKR SODIUM: 138 mmol/L (ref 136–144)
BKR WAM NUCLEATED RED BLOOD CELLS: 0.3 mg/dL — ABNORMAL HIGH (ref <=1.2)

## 2020-03-16 LAB — CBC WITH AUTO DIFFERENTIAL
BKR ASPARTATE AMINOTRANSFERASE (AST): 0.55 x 1000/??L (ref 0.00–1.00)
BKR WAM ABSOLUTE IMMATURE GRANULOCYTES.: 0.01 x 1000/ÂµL (ref 0.00–0.30)
BKR WAM ABSOLUTE LYMPHOCYTE COUNT.: 1.6 x 1000/ÂµL (ref 0.60–3.70)
BKR WAM ABSOLUTE NRBC (2 DEC): 0 x 1000/??L (ref 0.00–1.00)
BKR WAM ANALYZER ANC: 4.85 x 1000/??L — ABNORMAL HIGH (ref 2.00–7.60)
BKR WAM BASOPHIL ABSOLUTE COUNT.: 0.05 x 1000/ÂµL (ref 0.00–1.00)
BKR WAM BASOPHILS: 0.7 % (ref 0.0–1.4)
BKR WAM EOSINOPHIL ABSOLUTE COUNT.: 0.19 x 1000/ÂµL (ref 0.00–1.00)
BKR WAM EOSINOPHILS: 2.6 % (ref 0.0–5.0)
BKR WAM HEMATOCRIT (2 DEC): 45.5 % (ref 38.50–50.00)
BKR WAM HEMOGLOBIN: 15 g/dL (ref 13.2–17.1)
BKR WAM IMMATURE GRANULOCYTES: 0.1 % (ref 0.0–1.0)
BKR WAM LYMPHOCYTES: 22.1 % (ref 17.0–50.0)
BKR WAM MCH (PG): 29.6 pg (ref 27.0–33.0)
BKR WAM MCHC: 33 g/dL (ref 31.0–36.0)
BKR WAM MCV: 89.7 fL (ref 80.0–100.0)
BKR WAM MONOCYTE ABSOLUTE COUNT.: 0.55 x 1000/ÂµL (ref 0.00–1.00)
BKR WAM MONOCYTES: 7.6 % (ref 4.0–12.0)
BKR WAM MPV: 10.8 fL (ref 8.0–12.0)
BKR WAM NEUTROPHILS: 66.9 % (ref 39.0–72.0)
BKR WAM PLATELETS: 264 x1000/??L (ref 150–420)
BKR WAM RDW-CV: 13.5 % (ref 11.0–15.0)
BKR WAM RED BLOOD CELL COUNT.: 5.07 M/??L (ref 4.00–6.00)
BKR WAM WHITE BLOOD CELL COUNT: 7.3 x1000/??L (ref 4.0–11.0)

## 2020-03-16 LAB — URINALYSIS-MACROSCOPIC W/REFLEX MICROSCOPIC
BKR BILIRUBIN, UA: NEGATIVE
BKR KETONES, UA: NEGATIVE
BKR LEUKOCYTE ESTERASE, UA: NEGATIVE
BKR NITRITE, UA: NEGATIVE
BKR SPECIFIC GRAVITY, UA: 1.024 /LPF — ABNORMAL HIGH (ref 1.005–1.020)
BKR UROBILINOGEN, UA: 0.2 EU/dL (ref <=2.0)

## 2020-03-16 LAB — PT/INR AND PTT (BH GH L LMW YH)
BKR INR: 0.98 (ref 0.92–1.08)
BKR PARTIAL THROMBOPLASTIN TIME: 24.1 s (ref 22.7–28.9)
BKR PROTHROMBIN TIME: 10.4 seconds — ABNORMAL LOW (ref 9.8–11.4)

## 2020-03-16 LAB — URINE MICROSCOPIC     (BH GH LMW YH)

## 2020-03-16 LAB — PHOSPHORUS     (BH GH L LMW YH): BKR PHOSPHORUS: 3.4 mg/dL (ref 2.2–4.5)

## 2020-03-16 LAB — T4, FREE: BKR FREE T4: 0.88 ng/dL

## 2020-03-16 LAB — MAGNESIUM: BKR MAGNESIUM: 1.8 mg/dL (ref 1.7–2.4)

## 2020-03-16 LAB — CK     (BH GH L LMW YH): BKR CREATINE KINASE TOTAL: 153 U/L (ref 11–204)

## 2020-03-16 LAB — LACTATE DEHYDROGENASE: BKR LACTATE DEHYDROGENASE: 197 U/L — ABNORMAL HIGH (ref 122–241)

## 2020-03-16 LAB — TSH: BKR THYROID STIMULATING HORMONE: 4.54 u[IU]/mL — ABNORMAL HIGH

## 2020-03-16 MED ORDER — MEPERIDINE 25 MG/2.5 ML IN 0.9% SODIUM CHLORIDE
Freq: Once | INTRAVENOUS | Status: DC | PRN
Start: 2020-03-16 — End: 2020-03-21

## 2020-03-16 MED ORDER — HYDROCORTISONE SODIUM SUCCINATE 100 MG SOLUTION FOR INJECTION
100 mg | Freq: Once | INTRAVENOUS | Status: DC | PRN
Start: 2020-03-16 — End: 2020-03-21

## 2020-03-16 MED ORDER — FAMOTIDINE 4 MG/ML IN 0.9% SODIUM CHLORIDE (ADULT)
Freq: Once | INTRAVENOUS | Status: DC | PRN
Start: 2020-03-16 — End: 2020-03-21

## 2020-03-16 MED ORDER — ALBUTEROL SULFATE 2.5 MG/3 ML (0.083 %) SOLUTION FOR NEBULIZATION
2.5 mg /3 mL (0.083 %) | RESPIRATORY_TRACT | Status: DC | PRN
Start: 2020-03-16 — End: 2020-03-21

## 2020-03-16 MED ORDER — DIPHENHYDRAMINE 50 MG/ML INJECTION SOLUTION
50 mg/mL | Freq: Once | INTRAVENOUS | Status: DC | PRN
Start: 2020-03-16 — End: 2020-03-21

## 2020-03-16 MED ORDER — SODIUM CHLORIDE 0.9 % BOLUS (NEW BAG)
0.9 % | Freq: Once | INTRAVENOUS | Status: DC | PRN
Start: 2020-03-16 — End: 2020-03-21

## 2020-03-16 MED ORDER — EPINEPHRINE 0.3 MG/0.3 ML INJECTION, AUTO-INJECTOR
0.3 mg/ mL | INTRAMUSCULAR | Status: DC | PRN
Start: 2020-03-16 — End: 2020-03-21

## 2020-03-16 MED ORDER — HIC 2000025461 PEMBROLIZUMAB (MK-3475)
Freq: Once | INTRAVENOUS | Status: CP
Start: 2020-03-16 — End: ?
  Administered 2020-03-16: 19:00:00 100.000 mL/h via INTRAVENOUS

## 2020-03-16 MED ORDER — PRESERVISION AREDS ORAL
Freq: Every day | ORAL | Status: AC
Start: 2020-03-16 — End: ?

## 2020-03-16 NOTE — Progress Notes
HIC # [id number] Cycle 16, Day 1 Patient seen and examined by Drema Halon, MDLabs and toxicity assessment reviewed by Drema Halon, MDAll labs are deemed NCS today per Drema Halon, MDCMP redrawn due to some labs having hemolyzed.Concomitant medications reviewed and updated.Calendar was reviewed with patient and they will RTC per protocolApproved for treatment today per Drema Halon, MD  ECOG PS = 1  Concomitant medications: MEDICATION SINGLE DOSE UNITS ROUTE FREQUECY START DATE STOP DATE INDICATION Aspirin 81 mg PO QD 2010 ongoing PPX CVD Simvastatin 20 Mg PO QD 2012 ongoing Hypercholesterolemia  Ambien 10 mg PO QD 2016 ongoing insomnia  Glucoside 10 mg PO QD 2014 ongoing diabetes  Toujeo Solostar  80 Units SQ QD  2016  07/08/19 diabetes  guaifenesin 400 mg PO q 4 hours prn 07/10/19 08/16/19 cough  imodium 2 Mg PO PRN 07/19/19 ongoing diarrhea Toujeo Solostar 40-50 units subcutaneous QD 07/09/19 10/01/19 diabetes  Toujeo Solostar 60 units SQ QD 10/02/19  01/15/20 diabetes Cefalexin 250 Mg Oral TID 12/24/19 01/02/20 PPX Infection  Moderna COVID Booster 1  Injection IM Once 01/10/20 01/10/20 PPX COVID-19  Toujeo Solostar  70 Units subcutaneous  QD  01/15/20 03/15/20 Diabetes Lispro 5 Units SQ Once 02/03/20 02/03/20 diabetes  Toujeo Solostar  70 Units subcutaneous  QD  03/16/20 ongoing Diabetes Areds Preservision 1 Tab PO BID 03/16/20 Ongoing Vision supplement   Medical History:                                                         PATIENT NAME: Jason Rowland  STUDY ID:  [id number]     0=NA                      1= Unrelated         2= Unlikely 3=Possible         4= Probable           5. Definite (Circle One) 0= No Action Taken                                         1= Con Med                                                            2=Dose Modified                                        3=Dose Delayed                                                         4= Patient Hospitalized  5= Patient Taken Off Study                    6=Non Drug Therapy                                        7=Other (Specify) 1=Recovered                       2=Still under txmt/observation                              3=Alive w/Sequelae                                                                 4=Died                         5=Resolvd to Baseline             6=Grade Change       Medical History                       ADVERSE EVENT  Is AE Intermittent? Y/N SAE             Y/N Grade Expect PEMBRO Y/N ?Relation to [facility] Expect mRNA            Y/N ?Relation to mRNA Start Date End Date/ or ?Ongoing Action Taken               Designer, jewellery # above) AE OUTCOME   Hypercholesterolemia N N 1 Med Hx Med Hx Med Hx Med Hx 2012 ongoing 1= simvastatin 2   diabetes N N 1 Med Hx Med Hx Med Hx Med Hx 2010 ongoing 1- glucoside; toujeo 2   Insomnia N N 1 Med Hx Med Hx Med Hx Med Hx 2016 ongoing 1- ambien 2   Edema limbs N N 1 Med Hx  Med Hx Med Hx Med Hx  03/03/2019 ongoing 0 2   Diarrhea  N N 1 Y  1  N/A  N/A  05/27/2019 05/27/2019 0 5   fatigue   N N  1   Y 3   Y 3   06/26/19  ongoing  0 2    dysgeusia N N 1 N 2 N 2 06/22/19 08/21/19 0 2   anorexia N N 1 Y 2 Y 2 06/22/19 08/21/19 0 2   Pain, injection site Y N 1 N 1 Y 4 06/19/19 06/23/19 0 2   Eye disorders, other (left eye droop) N N 1 N 2 N 2 07/01/19 07/31/19 0 2   vomiting N N 1 Y 3 Y 3 07/08/19 07/08/19 0 1   dirrahea Y N 1 Y 3 Y 3 07/08/19 07/08/19 0 2   cough N N 1 Y 3 Y 3 07/08/19 08/16/19 1 = gueifenisen 2   ALT increased N N 1 Y 3 Y 3 07/10/19 07/31/19 0 2   AST increased  N N 1 Y 3 Y 3 07/10/19 07/31/19 0 2   Alkaline phos increased  N N 2 Y 3 Y 3 07/10/19 07/31/19 0 2   Diarrhea  Y N 2 Y 3 Y 3 07/09/19 07/20/2019 1- imodium 1   Injection site pain Y N 1 N 1 Y 3 07/08/19 12/25/19 0 2   bloating Y N 1 N 1 N 1 07/08/19 ongoing 0 2   Bilateral foot neuropathy N N 1 N/A N/A N/A N/A Baseline 02/2019 ongoing 0 2 Dyspnea on exertion Y N 1 N/A N/A N/A N/A Baseline 02/2019 ongoing 0 2   Diarrhea  N N 1 Y 1 Y 1 09/25/19  09/25/19 1- imodium 1   Anorexia  N N 1 Y 2 Y 2 10/02/19  11/13/19 0 2   Diarrhea  N N 1 Y 1 Y 1 10/16/19  10/17/19 0 1   diarrhea  N N 1 Y 1 Y 1 11/14/19  11/15/19 0 1   diarrhea  N N 1 Y 1 Y 1 01/10/20  01/12/20 0 1   diarrhea  N N 1 Y 1 Y 1 03/15/20  03/15/20 0 1                                                         Labs:Results for Jason Rowland (MRN [id number]) as of 03/16/2020 14:18 Ref. Range 03/16/2020 11:01 03/16/2020 12:56 Sodium Latest Ref Range: 136 - 144 mmol/L 138  Potassium Latest Ref Range: 3.3 - 5.3 mmol/L 5.1  Chloride Latest Ref Range: 98 - 107 mmol/L 102  CO2 Latest Ref Range: 20 - 30 mmol/L 27  Anion Gap Latest Ref Range: 7 - 17  9  BUN Latest Ref Range: 8 - 23 mg/dL 23  Creatinine Latest Ref Range: 0.40 - 1.30 mg/dL 6.21  BUN/Creatinine Ratio Latest Ref Range: 8.0 - 23.0  24.5 (H)  eGFR (NON African-American) Latest Ref Range: >60 mL/min/1.36m2 >60  eGFR (Afr Amer) Latest Ref Range: >60 mL/min/1.25m2 >60  Glucose Latest Ref Range: 70 - 100 mg/dL 308 (H)  Calcium Latest Ref Range: 8.8 - 10.2 mg/dL 9.3  Magnesium Latest Ref Range: 1.7 - 2.4 mg/dL 1.8  Phosphorus Latest Ref Range: 2.2 - 4.5 mg/dL 3.4  Total Bilirubin Latest Ref Range: <=1.2 mg/dL 0.3  Alkaline Phosphatase Latest Ref Range: 9 - 122 U/L 78  Alanine Aminotransferase (ALT) Latest Ref Range: 9 - 59 U/L 26  Aspartate Aminotransferase (AST) Latest Ref Range: 10 - 35 U/L 29  AST/ALT Ratio Latest Ref Range: See Comment  1.1  LD Latest Ref Range: 122 - 241 U/L 197  Total Protein Latest Ref Range: 6.6 - 8.7 g/dL 7.2  Albumin Latest Ref Range: 3.6 - 4.9 g/dL 4.1  Globulin Latest Ref Range: 2.3 - 3.5 g/dL 3.1  A/G Ratio Latest Ref Range: 1.0 - 2.2  1.3  Total CK Latest Ref Range: 11 - 204 U/L 153  T3, Total Latest Ref Range: See Comment ng/dL 65.7  Thyroid Stimulating Hormone, 3rd Gen. Latest Ref Range: See Comment ?IU/mL 4.540 (H)  Free T4 Latest Ref Range: See Comment ng/dL 8.46  WBC Latest Ref Range: 4.0 - 11.0 x1000/?L 7.3  RBC Latest Ref Range: 4.00 - 6.00 M/?L 5.07  Hemoglobin Latest Ref Range: 13.2 - 17.1 g/dL 96.2  Hematocrit Latest Ref Range: 38.50 - 50.00 % 45.50  MCV Latest Ref Range: 80.0 - 100.0  fL 89.7  MCH Latest Ref Range: 27.0 - 33.0 pg 29.6  MCHC Latest Ref Range: 31.0 - 36.0 g/dL 57.8  RDW-CV Latest Ref Range: 11.0 - 15.0 % 13.5  Platelets Latest Ref Range: 150 - 420 x1000/?L 264  MPV Latest Ref Range: 8.0 - 12.0 fL 10.8  Neutrophils Latest Ref Range: 39.0 - 72.0 % 66.9  Lymphocytes Latest Ref Range: 17.0 - 50.0 % 22.1  Monocytes Latest Ref Range: 4.0 - 12.0 % 7.6  Eosinophils Latest Ref Range: 0.0 - 5.0 % 2.6  Basophils Latest Ref Range: 0.0 - 1.4 % 0.7  Immature Granulocytes Latest Ref Range: 0.0 - 1.0 % 0.1  nRBC Latest Ref Range: 0.0 - 1.0 % 0.0  ANC (Abs Neutrophil Count) Latest Ref Range: 2.00 - 7.60 x 1000/?L 4.85  Absolute Lymphocyte Count Latest Ref Range: 0.60 - 3.70 x 1000/?L 1.60  Monocytes (Absolute) Latest Ref Range: 0.00 - 1.00 x 1000/?L 0.55  Eosinophil Absolute Count Latest Ref Range: 0.00 - 1.00 x 1000/?L 0.19  Basophils Absolute Latest Ref Range: 0.00 - 1.00 x 1000/?L 0.05  Immature Granulocytes (Abs) Latest Ref Range: 0.00 - 0.30 x 1000/?L 0.01  nRBC Absolute Latest Ref Range: 0.00 - 1.00 x 1000/?L 0.00  Prothrombin Time Latest Ref Range: 9.8 - 11.4 seconds 10.4  INR Latest Ref Range: 0.92 - 1.08  0.98  PTT Latest Ref Range: 22.7 - 28.9 seconds 24.1  Clarity, UA Latest Ref Range: Clear   Clear Specific Gravity, UA Latest Ref Range: 1.005 - 1.020   1.024 (H) pH, UA Latest Ref Range: 5.5 - 7.5   6.0 Protein, UA Latest Ref Range: Negative-Trace   2+ (A) Glucose, UA Latest Ref Range: Negative   Trace Ketones, UA Latest Ref Range: Negative   Negative Blood, UA Latest Ref Range: Negative   Small (A) Bilirubin, UA Latest Ref Range: Negative   Negative Leukocytes, UA Latest Ref Range: Negative   Negative Nitrite, UA Latest Ref Range: Negative   Negative WBC/HPF Latest Ref Range: 0 - 5 /HPF  3-5 RBC/HPF Latest Ref Range: 0 - 2 /HPF  0-2 Bacteria, UA Latest Ref Range: None-Few /HPF  None Color, UA Latest Ref Range: Yellow   Yellow Urobilinogen, UA Latest Ref Range: <=2.0 EU/dL  0.2 Epithelial Cells Latest Ref Range: None-Few /LPF  Few

## 2020-03-16 NOTE — Progress Notes
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 Laveda Norman and [facility].On interview pt states no new complaints at this time.Pre-dose research labs + 100 day MRNA FU labs drawn.Pembrolizumab infused from 1425-1455 without incident.Line flushed per protocol.At end of infusion PIV flushed and removed.Discharged in stable condition in care of self.

## 2020-03-17 ENCOUNTER — Encounter: Admit: 2020-03-17 | Payer: PRIVATE HEALTH INSURANCE

## 2020-03-17 NOTE — Progress Notes
NAME:  Jason Rowland (April 17, 1946)AGE: 74 y.o. MRN:  [id number] PROVIDER: Drema Halon, MD, PhDDATE OF SERVICE: 03/16/2020 [place] [facility] [place]  REASON FOR VISIT:  HIC 40347 treatment visitONC DX:  Invasive melanoma right mid backSTAGE:  IIIC (pT4a N3 M0); 30 mm with microsatellitosis, mitotic rate 2 per mm2.MOLECULAR PROFILING:0% tumor and immune cell PD-L1 staining50-gene panel:  DNA VARIANT DETECTED ? ? 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 ? 74% PRIOR TX:  WLE and SLNB 11/16/20ACTIVE TX:  Adjuvant Moderna clinical trial (?A Phase 2 Randomized Study of Adjuvant Immunotherapy with the Personalized Cancer Vaccine mRNA-4157 and Pembrolizumab Versus Pembrolizumab Alone After Complete Resection of High-Risk Melanoma? Protocol mRNA-[id number], HIC 42595) C1D1 05/08/19.  Randomized to the pembrolizumab and vaccine arm. Onc Hx: Jason Rowland (prefers to be called Jason Rowland) is a 74 y.o. male who worked for [employer] (retired at the end of 02/2019 after working there for 50 years) with a PMH of DM2 complicated by peripheral neuropathy, HLD, and AKs who is referred by Jason Rowland, his dermatologist for a right back metastatic melanoma.  He reports it appeared approx 10 months prior; his PCP originally thought it was a pimple.  Dr. Lorn Rowland at [facility] later evaluated it and also thought it was a pimple and injected it with intralesional kenalog x 2 and oral cephalexin without improvement.  The lesion increased in size and burned whenever he leaned against anything.  Denies night sweats and weight loss.  Jason Rowland did a I&D on 01/07/19  with only slight bloody discharge, so a punch biopsy was obtain with path showing metastatic melanoma.  On 01/20/19, additional biopsies were done to try to evaluate a primary site, but we do not have this tissue.  Per patient report, these biopsies were negative. MRI brain 01/30/19 without intracranial disease.  PET done 02/07/19 showing preepiglottic FDG activity s/p ENT evaluation which was negative for any visible malignancy.  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 LDH elevated to 251 at diagnosis. His case was presented at the [facility] tumor board on 02/13/19 and consensus was to ask ENT for random biopsies of the BOT and pre-epiglottic PET-avid areas, as we do not want to miss a head and neck cancer but otherwise not felt to be related to his melanoma.  The cervical LN were not thought to be related.  He should also have gastroenterology evaluation for a PET-avid stranding lesion in the transverse colon.  Jason Rowland (ENT) performed BOT and vallecula random biopsies 02/20/19 which only showed chronic inflammation and no malignancy.  He had a colonoscopy by Jason Rowland on 02/27/19 showing only a tubular adenoma in the sigmoid colon.  Overall, it was felt that his right mid back lesion is the site of his primary disease, and the metastatic melanoma pathology interpretation may have been complicated due to repeat I&Ds and steroid injections that may have altered the architecture of the primary lesion.  In the absence of any confirmed metastatic disease, he underwent WLE and LND with Dr. Duanne Rowland on 03/03/19.  Final path showed a 30 mm thick melanoma, 2 mitoses/mm2, non-brisk TILs, no tumor regression, LVI+, microsatellites+, negative margins, and 3/35 lymph nodes (largest LN 4.5 x 3 cm) were positive for melanoma with presence of lymphatic invasion without extranodal extension.He uses a cane sometimes as he has some balance issues due to neuropathy in his feet.  Otherwise, he is fully functional, active, and independent in ADLs/iADLs.  No hx of autoimmune disease.03/27/19 signed  consent for the Moderna trial; randomized to pembro and vaccine arm.  Started treatment C1D1 05/08/19.04/24/19 noted to have wound dehiscence, non-infected, healing by secondary intent.  Re-instated VNA services to assist with dressing changes and packing.  Wound completely healed by 06/20/19.10/27/19 surveillance scans NED.Interval ZO:XWRU returns for follow-up today and C16 of his clinical trial.Notes home BP values have been better.  Since his last treatment 3 weeks ago, he notes the following?issues/updates:?GI:-Diarrhea: 1 episode yesterday related to diet, isolated and resolved without medications.  Not related to study Rx.-taste changes/loss of smell and appetite (06/22/19) resolved 08/21/19 back to baseline.  Has some recurrence of sx since (09/25/19) -- still improved from March; remains resolved to baseline.-ongoing occasional gas pains Gr1 with sensation of bloating, intermittent relieved with flatus; stable?Resp:-DOE with walking long distances about a quarter of a mile; stable (pre-dated tx on trial)?Extremities:- lower extremity swelling stable and minimal?Metabolic-?long-acting?insulin dose 80 units qhs - up from 70 since last visit.- fatigue?(Gr1)?since 3/11 which waxes and wanes; slightly improved with exercise. ?Has difficulty going to sleep, stable; still taking his chronic Remus Loffler Denies any fever. ?Going to [facility] for 2 weeks.ECOG:  1Pain: reported as 0 today (does have hx of chronic neuropathy pain)Review of Systems:No fevers, chills, night sweats, lightheadedness, HA, CP, palpitations, wheezing, abd pain, constipation, blood in the urine or stools, dysuria.  Diabetic neuropathy in the hands and feet stable.  He has a prior hx of intermittent loose stools, chronic, without blood depending on his diet (often isolated to a specific restaurant he likes to visit with his buddies).  Positive responses on ROS as noted in interval hx. PAST MEDICAL HISTORY:Past Medical History: Diagnosis Date ? Diabetes mellitus (HC Code) (HC CODE)  ? High cholesterol  ? Malignant melanoma of torso excluding breast (HC Code) (HC CODE) SURGICAL HISTORY:Past Surgical History: Procedure Laterality Date ? COLONOSCOPY   ? CORONARY ANGIOPLASTY WITH STENT PLACEMENT Bilateral  ? laryngoscopy with biopsy  02/20/2019 ? ROTATOR CUFF REPAIR Left 1999 ? TONSILLECTOMY    age 39 ? TOTAL HIP ARTHROPLASTY Right 2002 FAMILY HISTORY:Family History Problem Relation Age of Onset ? Heart disease Mother  ? Bladder cancer Father 40 ? Kidney cancer Sister  ? Leukemia Brother 34 ? No Known Problems Daughter  ? No Known Problems Son  SOCIAL HISTORY:Social History Socioeconomic History ? Marital status: Divorced   Spouse name: Not on file ? Number of children: Not on file ? Years of education: Not on file ? Highest education level: Not on file Occupational History ? Not on file Tobacco Use ? Smoking status: Never Smoker ? Smokeless tobacco: Never Used Vaping Use ? Vaping Use: Never used Substance and Sexual Activity ? Alcohol use: Not Currently ? Drug use: No ? Sexual activity: Not on file Other Topics Concern ? Not on file Social History Narrative  Divorced, works for [employer] in [place] will be retiring 03/17/19.  Has 1 son in [place] and 1 daughter in [place]. Llives in [place]  Social Determinants of Health Financial Resource Strain:  ? Difficulty of Paying Living Expenses:  Food Insecurity:  ? Worried About Programme researcher, broadcasting/film/video in the Last Year:  ? Barista in the Last Year:  Transportation Needs:  ? Freight forwarder (Medical):  ? Lack of Transportation (Non-Medical):  Physical Activity:  ? Days of Exercise per Week:  ? Minutes of Exercise per Session:  Stress:  ? Feeling of Stress :  Social Connections:  ? Frequency of Communication with Friends  and Family:  ? Frequency of Social Gatherings with Friends and Family:  ? Attends Religious Services:  ? Active Member of Clubs or Organizations:  ? Attends Banker Meetings:  ? Marital Status:  Intimate Partner Violence:  ? Fear of Current or Ex-Partner:  ? Emotionally Abused:  ? Physically Abused:  ? Sexually Abused:  ALLERGIES:No Known AllergiesMEDICATIONS:Current Outpatient Medications Medication Sig ? ALPRAZolam (XANAX) 0.5 mg tablet 0.5 mg.  ? aspirin 81 MG EC tablet Take 81 mg by mouth nightly.  ? docusate sodium (COLACE) 250 mg capsule Take 1 capsule (250 mg total) by mouth 2 (two) times daily as needed for constipation. ? FREESTYLE LIBRE 14 DAY sensor kit 1 each by Other route every 14 (fourteen) days. ? glipiZIDE (GLUCOTROL XL) 10 MG 24 hr tablet Take 10 mg by mouth nightly.  ? olopatadine (PATANOL) 0.1 % ophthalmic solution as needed (takes for eye irritation as needed).  ? simvastatin (ZOCOR) 20 MG tablet Take 20 mg by mouth nightly.  ? TOUJEO SOLOSTAR U-300 300 unit/mL (1.5 mL) pen Inject 30 Units under the skin nightly. ? vit A/vit C/vit E/zinc/copper (PRESERVISION AREDS ORAL) Take by mouth 2 (two) times daily. ? zolpidem (AMBIEN) 10 mg tablet nightly.. ? acetaminophen (TYLENOL) 325 mg tablet Take 3 tablets (975 mg total) by mouth every 6 (six) hours as needed (mild to moderate pain). (Patient not taking: Reported on 02/24/2020) ? ibuprofen (ADVIL,MOTRIN) 200 mg tablet Take 2 tablets (400 mg total) by mouth every 6 (six) hours as needed (alternate with tylenol for mild to moderate pain). (Patient not taking: Reported on 02/24/2020) No current facility-administered medications for this visit. Facility-Administered Medications Ordered in Other Visits Medication ? albuterol neb sol 2.5 mg/3 mL (0.083%) (PROVENTIL,VENTOLIN) ? diphenhydrAMINE (BENADRYL) injection 50 mg ? EPINEPHrine (EPI-PEN) auto-injector 0.3 mg ? EPINEPHrine (EPI-PEN) auto-injector 0.3 mg ? famotidine (PF) (PEPCID) 20 mg in sodium chloride 0.9 % 5 mL Injection ? hydrocortisone sodium succinate (Solu-CORTEF) injection 100 mg ? sodium chloride 0.9 % (new bag) bolus 500 mL VITALS: BP (!) 167/99  - Pulse 89  - Temp 97.2 ?F (36.2 ?C)  - Resp 18  - Wt 121.6 kg  - SpO2 96%  - BMI 38.28 kg/m?  PHYSICAL EXAM:Gen:  NAD, pleasant HEENT:  EOMI, PEARLLN:  No cervical, supraclavicular, axillary, or inguinal LAD. CV:  No murmurs, tachy, no m/r/g Pulm:  CTA b/lAbd:  Soft, no tenderness, no guarding or rebound, no organomegaly, no ascitesExt:  WWP, 1+ symmetric edema in the ankles.  Lymphedema present in the right arm, unchanged.Neuro:   Grossly intactPsych:  Normal mood and affectSkin:  Scar on the right upper back well healed without evidence of recurrence. Small lateral skin puckering at the edges of the scar stable.  Some flaking skin/dryness in the middle portion of the scar.  Right axillary scar well healed.LABS:Results for orders placed or performed during the hospital encounter of 03/16/20 MAGNESIUM Result Value Ref Range  Magnesium 1.8 1.7 - 2.4 mg/dL Phosphorus     (BH GH L LMW YH) Result Value Ref Range  Phosphorus 3.4 2.2 - 4.5 mg/dL Lactate dehydrogenase Result Value Ref Range  LD 197 122 - 241 U/L CK     (BH GH L LMW YH) Result Value Ref Range  Total CK 153 11 - 204 U/L PT/INR and PTT     (BH GH L YH) Result Value Ref Range  Prothrombin Time 10.4 9.8 - 11.4 seconds  INR 0.98 0.92 - 1.08  PTT 24.1 22.7 -  28.9 seconds TSH Result Value Ref Range  Thyroid Stimulating Hormone 4.540 (H) See Comment ?IU/mL T4, free Result Value Ref Range  Free T4 0.88 See Comment ng/dL CBC auto differential Result Value Ref Range  WBC 7.3 4.0 - 11.0 x1000/?L  RBC 5.07 4.00 - 6.00 M/?L  Hemoglobin 15.0 13.2 - 17.1 g/dL  Hematocrit 84.69 62.95 - 50.00 %  MCV 89.7 80.0 - 100.0 fL  MCH 29.6 27.0 - 33.0 pg  MCHC 33.0 31.0 - 36.0 g/dL  RDW-CV 28.4 13.2 - 44.0 %  Platelets 264 150 - 420 x1000/?L  MPV 10.8 8.0 - 12.0 fL  Neutrophils 66.9 39.0 - 72.0 % Lymphocytes 22.1 17.0 - 50.0 %  Monocytes 7.6 4.0 - 12.0 %  Eosinophils 2.6 0.0 - 5.0 %  Basophil 0.7 0.0 - 1.4 %  Immature Granulocytes 0.1 0.0 - 1.0 %  nRBC 0.0 0.0 - 1.0 %  ANC(Abs Neutrophil Count) 4.85 2.00 - 7.60 x 1000/?L  Absolute Lymphocyte Count 1.60 0.60 - 3.70 x 1000/?L  Monocyte Absolute Count 0.55 0.00 - 1.00 x 1000/?L  Eosinophil Absolute Count 0.19 0.00 - 1.00 x 1000/?L  Basophil Absolute Count 0.05 0.00 - 1.00 x 1000/?L  Absolute Immature Granulocyte Count 0.01 0.00 - 0.30 x 1000/?L  Absolute nRBC 0.00 0.00 - 1.00 x 1000/?L Comprehensive metabolic panel Result Value Ref Range  Sodium 138 136 - 144 mmol/L  Potassium 5.1 3.3 - 5.3 mmol/L  Chloride 102 98 - 107 mmol/L  CO2 27 20 - 30 mmol/L  Anion Gap 9 7 - 17  Glucose 209 (H) 70 - 100 mg/dL  BUN 23 8 - 23 mg/dL  Creatinine 1.02 7.25 - 1.30 mg/dL  Calcium 9.3 8.8 - 36.6 mg/dL  BUN/Creatinine Ratio 44.0 (H) 8.0 - 23.0  Total Protein 7.2 6.6 - 8.7 g/dL  Albumin 4.1 3.6 - 4.9 g/dL  Total Bilirubin 0.3 <=3.4 mg/dL  Alkaline Phosphatase 78 9 - 122 U/L  Alanine Aminotransferase (ALT) 26 9 - 59 U/L  Aspartate Aminotransferase (AST) 29 10 - 35 U/L  Globulin 3.1 2.3 - 3.5 g/dL  A/G Ratio 1.3 1.0 - 2.2  AST/ALT Ratio 1.1 See Comment  eGFR (Afr Amer) >60 >60 mL/min/1.26m2  eGFR (NON African-American) >60 >60 mL/min/1.67m2 Urinalysis-macroscopic w/reflex microscopic Result Value Ref Range  Clarity, UA Clear Clear  Color, UA Yellow Yellow  Specific Gravity, UA 1.024 (H) 1.005 - 1.020  pH, UA 6.0 5.5 - 7.5  Protein, UA 2+ (A) Negative-Trace  Glucose, UA Trace Negative  Blood, UA Small (A) Negative  Bilirubin, UA Negative Negative  Ketones, UA Negative Negative  Leukocytes, UA Negative Negative  Nitrite, UA Negative Negative  Urobilinogen, UA 0.2 <=2.0 EU/dL Urine microscopic     (BH GH LMW YH) Result Value Ref Range  Epithelial Cells Few None-Few /LPF  WBC/HPF 3-5 0 - 5 /HPF  RBC/HPF 0-2 0 - 2 /HPF  Bacteria, UA None None-Few /HPF T3 Result Value Ref Range  T3, Total 88.3 See Comment ng/dL IMAGING/PATH:[place] abd/pelvis 01/12/20:  NEDCT chest 01/12/20:  1.  Interval increase in size of a left subpectoral lymph node as described, nonspecific but could represent metastatic disease. Otherwise no enlarging lymph nodes identified.2.  Multiple pulmonary nodules measuring up to 5 mm, stable since  [place] chest dated 04/17/2019. Follow-up as per oncologic protocol is recommended.MRI Brain and [place] CAP 04/17/19:  NEDPET 02/07/19:Head And Neck: Base of tongue and preepiglottic FDG activity is nonspecific (SUV max 6.6).Mildly FDG avid cervical lymph nodes are nonspecific, for  example:*  Left level 2 (Weed image 874, 1.4 x 0.8 cm, SUV max 2.6)*  Right level 2 (Hazen image 878, 1.4 x 1 cm, SUV max 2.1)CHEST:Hypermetabolic mass overlying the right posterior 10th rib is consistent with malignancy, and involves the skin, subcutaneous fat, extending to the latissimus dorsi muscle (SUV max 15, 6.3 x 3 x 5.9 cm).Two hypermetabolic right axillary lymph nodes are highly suspicious for metastases (up to SUV max 8.3, 2.4 x 1.7 cm). Multiple additional smaller right subpectoral lymph nodes are minimally FDG avid, and are indeterminate (SUV max 1.2, subcentimeter).Azygos pulmonary fissure is incidentally noted. No suspicious pulmonary nodule is identified, although pulmonary detail is partially obscured by breathing motion. Coronary arteries and the aortic valve are calcified. The heart is on the upper bound of normal size. Nonspecific pattern of myocardial FDG activity. No mediastinal lymphadenopathy is identified.Abdomen/Pelvis: Diffuse hepatic and splenic heterogeneity most likely represents artifactual image noise, however this could conceal any small lesions.Multiple colonic diverticuli show no evidence of diverticulitis. Diffuse patchy colonic FDG activity is nonspecific, commonly inflammatory or related to a pharmacologic effect, however this could conceal any small lesions (SUV max 9 the sigmoid). Left renal cortical defect associated with a punctuate calcification likely represents scarring. Right renal calyceal calcifications likely represent nonobstructing stones (up to 0.5 cm).Normal appearance of the gallbladder, spleen, stomach, pancreas, adrenal glands, small bowel, decompressed urinary bladder.Streak artifacts arising from metallic right hip prostheses obscure nearby structures.Small retroperitoneal lymph nodes are not FDG avid, although below resolution of PET. Otherwise, no retroperitoneal, mesenteric, or pelvic lymphadenopathy is identified. Inguinal lymph nodes are nonspecific (SUV max 1.5), commonly inflammatory.MUSCULOSKELETAL:Heterogeneous marrow FDG activity is likely due to a combination of artifactual image noise, and marrow activation, however this reduces sensitivity for any small lesions (SUV max 4.6 in the sacrum). Right knee FDG activity associated with cruciate ligaments and synovium is likely degenerative or posttraumatic (SUV max 5), with small knee effusion (SUV max 3.3).IMPRESSION:1.  The accession number [id number] does not have any associated images in PACS at the time of dictation.  PET-Spangle images associated with the accession number [id number] / [id number] were interpreted in this report.2.  Hypermetabolic mass overlying the right posterior 10th rib is consistent with malignancy, and involves the skin, subcutaneous fat, extending to the latissimus dorsi muscle.3.  Right axillary nodal metastases.4.  Multiple small right subpectoral lymph nodes are indeterminate; metastases are not excluded.5.  Base of tongue and preepiglottic FDG activity is nonspecific, with slight asymmetric fullness on the left. Direct visualization is recommended to exclude primary neoplasm.6.  Minimally FDG avid bilateral level 2 cervical lymph nodes are nonspecific, possibly inflammatory.MRI brain 01/30/19:There is no intracranial hemorrhage or major vascular distribution infarct.No abnormal enhancement is seen.There is no mass effect, edema, or midline shift.The ventricles are symmetric and normal in size. Several scattered foci of T2/FLAIR hyperintense signal are noted in the periventricular and subcortical white matter, likely sequela of chronic small vessel ischemic disease. No extra-axial collection is seen.Prior lens replacements noted bilaterally.The paranasal sinuses are clear. Small right mastoid effusion noted.IMPRESSION:No evidence of intracranial metastatic disease.WLE and SLN 03/03/19:LYMPH NODES, RIGHT AXILLARY LEVELS 1, 2 AND 3, LYMPH NODE DISSECTION: ?  ? ? - ?THREE OF THIRTY-FIVE LYMPH NODES, POSITIVE FOR MELANOMA (3 OF 35) WITH LARGEST INVOLVED LYMPH NODE MEASURING 4.5 X 3 CM ? ? ?- LYMPHATIC INVASION IDENTIFIED ? ? ?- NO EXTRANODAL EXTENSION IDENTIFIED 1. ?SKIN, BACK, RIGHT UPPER, EXCISION: ? ? ? ?- MALIGNANT MELANOMA, SEE NOTE AND SYNOPTIC SUMMARY Note: Sections show a  tumor composed of S100 positive epithelioid cells within the dermis and subcutaneous tissue. Cytokeratin AE1/AE3 and desmin are negative. A junctional component is not identified. The findings would be compatible with a primary dermal melanoma or a metastatic lesion. Clinical correlation is suggested. 2. ?SKIN, BACK, EXCISION: ? ? ? ?- BENIGN FIBROADIPOSE TISSUE AND SKELETAL MUSCLE SYNOPTIC SUMMARY MELANOMA OF THE SKIN Tumor Site: ? ? Back Laterality: ? ? Right Procedure: ? ? Primary excision Maximum Tumor Thickness (Depth): ? ? 30 mm Ulceration: ? ? Not identified Mitotic Rate (Mitoses/mm2): ? ? 2 Anatomic Level: ? ? V (Melanoma invades subcutis) Growth Phase: ? ? Vertical Histologic Type: ? ? Melanoma, NOS Tumor-Infiltrating Lymphocytes: ? ? Present, non-brisk Tumor Regression: ? ? Not identifed Lymphovascular Invasion: ? ? Present Microsatellitosis: ? ? Present Margins ? ? ?Peripheral Margins: ? ? Uninvolved Deep Margin: ? ? Uninvolved Stage (AJCC 8th Ed): ? ? pT4a Nx Base of the tongue and vallecula random biopsies 02/20/19: 1. ?TONGUE, LEFT, BASE, BIOPSY: ? ? ? ?- LYMPHOID TISSUE HYPERPLASIA ? ? ?- NEGATIVE FOR MALIGNANCY 2. ?THROAT, VALLECULA, BIOPSY: ? ?  ? - LYMPHOID TISSUE HYPERPLASIA AND FOCAL CHRONIC INFLAMMATION. SEE NOTE. ? ? ?- NEGATIVE FOR MALIGNANCY ? ? ? ? ? Note: Immunostain of Sox-10 to investigate focal pigment is negative, supporting above interpretation. 3. ?THROAT, LEFT VALLECULA, BIOPSY: ? ? ? ?- LYMPHOID TISSUE HYPERPLASIA ? ? ?- NEGATIVE FOR MALIGNANCY 4. ?THROAT, RIGHT VALLECULA, BIOPSY: ? ? ? ? ? ? - LYMPHOID TISSUE HYPERPLASIA AND FOCAL CHRONIC INFLAMMATION ? ? ?- NEGATIVE FOR MALIGNANCY 5. ?TONGUE, RIGHT BASE, BIOPSY: ? ? ? ? ? ? - LYMPHOID TISSUE HYPERPLASIA AND FOCAL CHRONIC INFLAMMATION ? ? ?- NEGATIVE FOR MALIGNANCY Pathology (reviewed at [facility]), collected 01/07/19:DIAGNOSIS: ? 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 Clinical pathological correlation is recommended. MICROSCOPIC DESCRIPTION: There are atypical cells in a nodule in the dermis. The cells are positive with SOX-10 and by report are negative with LCA, Kappa, Lambda, and a cytokeratin. The cells are also positive with S-100 by report.ASSESSMENT/PLAN:[name] is a 74 y.o. 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  Initially path read as metastatic melanoma, but further workup confirms that this is the primary site, not a metastatic lesion.  Initial biopsy was likely altered due to repeat I&Ds of the area.  He had a FBSE with his dermatologist and no additional primary lesions were identified.  He is otherwise in good health except for metabolic syndrome and diabetes-associated neuropathy.  PET 02/07/19 showed the main lesion on the right upper back extending to the latissimus dorsi muscle with involved right axillary nodes and nonspecific avidity in the BOT and preepiglottis for which direct visual evaluation was recommended and done 02/11/19 and random biopsies 02/20/19 without concerns for malignancy. Colonoscopy was also done to evaluate stranding in the transverse colon, which was negative for malignancy on exam.  Now s/p WLE and LND with Dr. Duanne Rowland on 03/03/19 showing a stage IIIC melanoma that was 30 mm thick, positive for LVI and microsatellites with 3/25 positive LNs.  Given the thickness of his primary melanoma and microsatellitosis, we felt that his melanoma-specific survival is closer to a stage IIID:  60% over 10 years with stage IIIC disease versus 24% with stage IIID.  He does not have a BRAF mutation,  so SOC options are immune therapy at this point versus the Moderna clinical trial (?A Phase 2 Randomized Study of Adjuvant Immunotherapy with the Personalized Cancer Vaccine [phone] and Pembrolizumab Versus Pembrolizumab Alone After Complete Resection of High-Risk Melanoma?) vs active surveillance (for which he would be extremely high risk for relapsing).  He opted for the Moderna trial and signed consent on 03/27/19; tissue passed QC inspection.  Jason Rowland was randomized to the [facility] with vaccine arm of the trial.  He has experienced some weight loss due to loss of taste/smell/appetite after initiation the clinical trial, which is now resolved for the past several months with weight gain. He has stable right arm lymphedema but declines any lymphedema therapy or sleeves.  He also had some G2 diarrhea after C4, which resolved with Imodium and not requiring anti-diarrheals by C7.  Energy is improved after starting an exercise regimen.- labs reviewed today.  LDH wnl. TSH 4.54 but normal FT4.  Ok for treatment C16 today. Will get pembrolizumab only.- [place] CAP 7/12 NED; [place] CAP 01/12/20 showing a non-specific 1.1 cm left subpectoral LN likely related to COVID-19 vaccination (no longer palpable on subsequent exams), no definitive evidence of metastatic disease; repeat surveillance scans every 12 weeks (scheduled 12/20)- routine f/u with dermatologist every 4-6 months.  Last seen 12/23/19.  Has another appt coming up soon.- RTC in 3 weeks per trial schedule; instructed to call if any issues or questions in the interim20 min were spent in direct patient contact.  He has our clinic contact information and instructed to call if questions.  Jason Halon, MD, PhDMedical Oncology

## 2020-03-18 ENCOUNTER — Encounter: Admit: 2020-03-18 | Payer: PRIVATE HEALTH INSURANCE | Attending: Medical Oncology

## 2020-03-30 ENCOUNTER — Encounter: Admit: 2020-03-30 | Payer: PRIVATE HEALTH INSURANCE | Attending: Medical Oncology

## 2020-03-30 DIAGNOSIS — C4359 Malignant melanoma of other part of trunk: Secondary | ICD-10-CM

## 2020-04-04 ENCOUNTER — Ambulatory Visit: Admit: 2020-04-04 | Payer: PRIVATE HEALTH INSURANCE

## 2020-04-05 ENCOUNTER — Telehealth: Admit: 2020-04-05 | Payer: PRIVATE HEALTH INSURANCE

## 2020-04-05 NOTE — Telephone Encounter
Left message for Mr. Krabill to return my call.  [phone].  [name] CRA

## 2020-04-06 ENCOUNTER — Encounter: Admit: 2020-04-06 | Payer: PRIVATE HEALTH INSURANCE

## 2020-04-06 ENCOUNTER — Telehealth: Admit: 2020-04-06 | Payer: PRIVATE HEALTH INSURANCE

## 2020-04-06 ENCOUNTER — Inpatient Hospital Stay: Admit: 2020-04-06 | Discharge: 2020-04-06 | Payer: BLUE CROSS/BLUE SHIELD

## 2020-04-06 ENCOUNTER — Ambulatory Visit: Admit: 2020-04-06 | Payer: BLUE CROSS/BLUE SHIELD

## 2020-04-06 DIAGNOSIS — Z5112 Encounter for antineoplastic immunotherapy: Secondary | ICD-10-CM

## 2020-04-06 DIAGNOSIS — Z79899 Other long term (current) drug therapy: Secondary | ICD-10-CM

## 2020-04-06 DIAGNOSIS — C4359 Malignant melanoma of other part of trunk: Secondary | ICD-10-CM

## 2020-04-06 DIAGNOSIS — Z8249 Family history of ischemic heart disease and other diseases of the circulatory system: Secondary | ICD-10-CM

## 2020-04-06 DIAGNOSIS — Z85820 Personal history of malignant melanoma of skin: Secondary | ICD-10-CM

## 2020-04-06 DIAGNOSIS — Z8051 Family history of malignant neoplasm of kidney: Secondary | ICD-10-CM

## 2020-04-06 DIAGNOSIS — I890 Lymphedema, not elsewhere classified: Secondary | ICD-10-CM

## 2020-04-06 DIAGNOSIS — Z794 Long term (current) use of insulin: Secondary | ICD-10-CM

## 2020-04-06 DIAGNOSIS — Z8052 Family history of malignant neoplasm of bladder: Secondary | ICD-10-CM

## 2020-04-06 DIAGNOSIS — E7800 Pure hypercholesterolemia, unspecified: Secondary | ICD-10-CM

## 2020-04-06 DIAGNOSIS — D125 Benign neoplasm of sigmoid colon: Secondary | ICD-10-CM

## 2020-04-06 DIAGNOSIS — E119 Type 2 diabetes mellitus without complications: Secondary | ICD-10-CM

## 2020-04-06 DIAGNOSIS — E1142 Type 2 diabetes mellitus with diabetic polyneuropathy: Secondary | ICD-10-CM

## 2020-04-06 DIAGNOSIS — E785 Hyperlipidemia, unspecified: Secondary | ICD-10-CM

## 2020-04-06 DIAGNOSIS — Z7982 Long term (current) use of aspirin: Secondary | ICD-10-CM

## 2020-04-06 DIAGNOSIS — E1140 Type 2 diabetes mellitus with diabetic neuropathy, unspecified: Secondary | ICD-10-CM

## 2020-04-06 DIAGNOSIS — Z006 Encounter for examination for normal comparison and control in clinical research program: Secondary | ICD-10-CM

## 2020-04-06 DIAGNOSIS — Z955 Presence of coronary angioplasty implant and graft: Secondary | ICD-10-CM

## 2020-04-06 DIAGNOSIS — E8881 Metabolic syndrome: Secondary | ICD-10-CM

## 2020-04-06 LAB — COMPREHENSIVE METABOLIC PANEL
BKR A/G RATIO: 1.1 (ref 1.0–2.2)
BKR ALANINE AMINOTRANSFERASE (ALT): 24 U/L (ref 9–59)
BKR ALBUMIN: 4.2 g/dL (ref 3.6–4.9)
BKR ALKALINE PHOSPHATASE: 77 U/L (ref 9–122)
BKR ANION GAP: 13 (ref 7–17)
BKR ASPARTATE AMINOTRANSFERASE (AST): 26 U/L (ref 10–35)
BKR AST/ALT RATIO: 1.1
BKR BILIRUBIN TOTAL: 0.3 mg/dL (ref <=1.2)
BKR BLOOD UREA NITROGEN: 22 mg/dL (ref 8–23)
BKR BUN / CREAT RATIO: 26.2 — ABNORMAL HIGH (ref 8.0–23.0)
BKR CALCIUM: 9 mg/dL (ref 8.8–10.2)
BKR CHLORIDE: 101 mmol/L (ref 98–107)
BKR CO2: 25 mmol/L (ref 20–30)
BKR CREATININE: 0.84 mg/dL (ref 0.40–1.30)
BKR EGFR (AFR AMER): >60 mL/min/{1.73_m2} (ref >60)
BKR EGFR (NON AFRICAN AMERICAN): >60 mL/min/{1.73_m2} (ref >60)
BKR GLOBULIN: 3.7 g/dL — ABNORMAL HIGH (ref 2.3–3.5)
BKR GLUCOSE: 136 mg/dL — ABNORMAL HIGH (ref 70–100)
BKR POTASSIUM: 5.1 mmol/L (ref 3.3–5.3)
BKR PROTEIN TOTAL: 7.9 g/dL (ref 6.6–8.7)
BKR SODIUM: 139 mmol/L (ref 136–144)

## 2020-04-06 LAB — CBC WITH AUTO DIFFERENTIAL
BKR CORTISOL, PLASMA: 29.1 pg (ref 27.0–33.0)
BKR WAM ABSOLUTE IMMATURE GRANULOCYTES.: 0.02 x 1000/??L (ref 0.00–0.30)
BKR WAM ABSOLUTE LYMPHOCYTE COUNT.: 1.7 x 1000/??L (ref 0.60–3.70)
BKR WAM ABSOLUTE NRBC (2 DEC): 0 x 1000/??L (ref 0.00–1.00)
BKR WAM ANALYZER ANC: 5.17 x 1000/??L (ref 2.00–7.60)
BKR WAM BASOPHIL ABSOLUTE COUNT.: 0.06 x 1000/??L (ref 0.00–1.00)
BKR WAM BASOPHILS: 0.8 % (ref 0.0–1.4)
BKR WAM EOSINOPHIL ABSOLUTE COUNT.: 0.26 x 1000/??L (ref 0.00–1.00)
BKR WAM EOSINOPHILS: 3.3 % (ref 0.0–5.0)
BKR WAM HEMATOCRIT (2 DEC): 45.7 % (ref 38.50–50.00)
BKR WAM HEMOGLOBIN: 14.8 g/dL (ref 13.2–17.1)
BKR WAM IMMATURE GRANULOCYTES: 0.3 % (ref 0.0–1.0)
BKR WAM LYMPHOCYTES: 21.7 % (ref 17.0–50.0)
BKR WAM MCH (PG): 29.1 pg (ref 27.0–33.0)
BKR WAM MCHC: 32.4 g/dL (ref 31.0–36.0)
BKR WAM MCV: 90 fL (ref 80.0–100.0)
BKR WAM MONOCYTE ABSOLUTE COUNT.: 0.63 x 1000/??L (ref 0.00–1.00)
BKR WAM MONOCYTES: 8 % (ref 4.0–12.0)
BKR WAM MPV: 10.2 fL (ref 8.0–12.0)
BKR WAM NEUTROPHILS: 65.9 % (ref 39.0–72.0)
BKR WAM NUCLEATED RED BLOOD CELLS: 0 % (ref 0.0–1.0)
BKR WAM PLATELETS: 245 x1000/ÂµL (ref 150–420)
BKR WAM RDW-CV: 13.2 % (ref 11.0–15.0)
BKR WAM RED BLOOD CELL COUNT.: 5.08 M/??L (ref 4.00–6.00)
BKR WAM WHITE BLOOD CELL COUNT: 7.8 x1000/??L (ref 4.0–11.0)

## 2020-04-06 LAB — MAGNESIUM: BKR MAGNESIUM: 1.9 mg/dL (ref 1.7–2.4)

## 2020-04-06 LAB — T4, FREE: BKR FREE T4: 0.83 ng/dL

## 2020-04-06 LAB — T3: BKR T3 TOTAL: 92.2 ng/dL

## 2020-04-06 LAB — TSH W/REFLEX TO FT4     (BH GH LMW Q YH): BKR THYROID STIMULATING HORMONE: 3.58 ??IU/mL (ref 22.7–28.9)

## 2020-04-06 LAB — PT/INR AND PTT (BH GH L LMW YH)
BKR INR: 1.01 (ref 0.92–1.08)
BKR PARTIAL THROMBOPLASTIN TIME: 25.9 s (ref 22.7–28.9)
BKR PROTHROMBIN TIME: 10.7 seconds (ref 9.8–11.4)

## 2020-04-06 LAB — LACTATE DEHYDROGENASE: BKR LACTATE DEHYDROGENASE: 208 U/L (ref 122–241)

## 2020-04-06 LAB — PHOSPHORUS     (BH GH L LMW YH): BKR PHOSPHORUS: 3 mg/dL (ref 2.2–4.5)

## 2020-04-06 LAB — TSH: BKR THYROID STIMULATING HORMONE: 3.58 ??IU/mL

## 2020-04-06 MED ORDER — MEPERIDINE 25 MG/2.5 ML IN 0.9% SODIUM CHLORIDE
Freq: Once | INTRAVENOUS | Status: DC | PRN
Start: 2020-04-06 — End: 2020-04-11

## 2020-04-06 MED ORDER — DIPHENHYDRAMINE 50 MG/ML INJECTION SOLUTION
50 mg/mL | Freq: Once | INTRAVENOUS | Status: DC | PRN
Start: 2020-04-06 — End: 2020-04-11

## 2020-04-06 MED ORDER — SODIUM CHLORIDE 0.9 % BOLUS (NEW BAG)
0.9 % | Freq: Once | INTRAVENOUS | Status: DC | PRN
Start: 2020-04-06 — End: 2020-04-11

## 2020-04-06 MED ORDER — HYDROCORTISONE SODIUM SUCCINATE 100 MG SOLUTION FOR INJECTION
100 mg | Freq: Once | INTRAVENOUS | Status: DC | PRN
Start: 2020-04-06 — End: 2020-04-11

## 2020-04-06 MED ORDER — FAMOTIDINE 4 MG/ML IN 0.9% SODIUM CHLORIDE (ADULT)
Freq: Once | INTRAVENOUS | Status: DC | PRN
Start: 2020-04-06 — End: 2020-04-11

## 2020-04-06 MED ORDER — EPINEPHRINE 0.3 MG/0.3 ML INJECTION, AUTO-INJECTOR
0.3 mg/ mL | INTRAMUSCULAR | Status: DC | PRN
Start: 2020-04-06 — End: 2020-04-11

## 2020-04-06 MED ORDER — ALBUTEROL SULFATE 2.5 MG/3 ML (0.083 %) SOLUTION FOR NEBULIZATION
2.5 mg /3 mL (0.083 %) | RESPIRATORY_TRACT | Status: DC | PRN
Start: 2020-04-06 — End: 2020-04-11

## 2020-04-06 MED ORDER — HIC 2000025461 PEMBROLIZUMAB (MK-3475)
Freq: Once | INTRAVENOUS | Status: CP
Start: 2020-04-06 — End: ?
  Administered 2020-04-06: 19:00:00 100.000 mL/h via INTRAVENOUS

## 2020-04-06 NOTE — Telephone Encounter
Gave Mr. Dispenza scan date/time [place] Friday Jan 7th at 3:30 PM.  He can do that.  [name] CRA

## 2020-04-06 NOTE — Progress Notes
Arrived to clinic for C17D1 of HIC [id number] mRNA-4157 + Pembrolizumab.PIV placed and labs drawn.Seen and cleared to treat by Dr. Laveda Norman and [facility].He states that he is feeling pretty well with no new complaints at this time Pre-Dose research labs drawn at 1358.Pembrolizumab infused from [phone] with out issues. Line flushed per protocol.PIV with positive blood return on completion-flushed, removed, gauze, and coban applied.RTC on  04/27/2020 for next treatment.D/C'd from clinic in stable condition in the care of self.

## 2020-04-07 ENCOUNTER — Encounter: Admit: 2020-04-07 | Payer: PRIVATE HEALTH INSURANCE

## 2020-04-07 DIAGNOSIS — C4359 Malignant melanoma of other part of trunk: Secondary | ICD-10-CM

## 2020-04-07 DIAGNOSIS — E7800 Pure hypercholesterolemia, unspecified: Secondary | ICD-10-CM

## 2020-04-07 DIAGNOSIS — E119 Type 2 diabetes mellitus without complications: Secondary | ICD-10-CM

## 2020-04-07 NOTE — Progress Notes
NAME:  Jason Rowland (12/05/1945)AGE: 74 y.o. MRN:  [id number] PROVIDER: Drema Halon, MD, PhDDATE OF SERVICE: 04/06/2020 [facility] [place]  REASON FOR VISIT:  HIC 40981 treatment visitONC DX:  Invasive melanoma right mid backSTAGE:  IIIC (pT4a N3 M0); 30 mm with microsatellitosis, mitotic rate 2 per mm2.MOLECULAR PROFILING:0% tumor and immune cell PD-L1 staining50-gene panel:  DNA VARIANT DETECTED ? ? 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 ? 74% PRIOR TX:  WLE and SLNB 11/16/20ACTIVE TX:  Adjuvant Moderna clinical trial (?A Phase 2 Randomized Study of Adjuvant Immunotherapy with the Personalized Cancer Vaccine mRNA-4157 and Pembrolizumab Versus Pembrolizumab Alone After Complete Resection of High-Risk Melanoma? Protocol mRNA-4157-P201, HIC 19147) C1D1 05/08/19.  Randomized to the pembrolizumab and vaccine arm. Onc Hx: Jason Rowland (prefers to be called Jason Rowland) is a 74 y.o. male who worked for 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  He reports it appeared approx 10 months prior; his PCP originally thought it was a pimple.  Dr. Lorn Junes at [facility] later evaluated it and also thought it was a pimple and injected it with intralesional kenalog x 2 and oral cephalexin without improvement.  The lesion increased in size and burned whenever he leaned against anything.  Denies night sweats and weight loss.  Lucie Leather did a I&D on 01/07/19  with only slight bloody discharge, so a punch biopsy was obtain with path showing metastatic melanoma.  On 01/20/19, additional biopsies were done to try to evaluate a primary site, but we do not have this tissue.  Per patient report, these biopsies were negative. MRI brain 01/30/19 without intracranial disease.  PET done 02/07/19 showing preepiglottic FDG activity s/p ENT evaluation which was negative for any visible malignancy.  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 LDH elevated to 251 at diagnosis. His case was presented at the [facility] Melanoma tumor board on 02/13/19 and consensus was to ask ENT for random biopsies of the BOT and pre-epiglottic PET-avid areas, as we do not want to miss a head and neck cancer but otherwise not felt to be related to his melanoma.  The cervical LN were not thought to be related.  He should also have gastroenterology evaluation for a PET-avid stranding lesion in the transverse colon.  Dr. Landis Gandy (ENT) performed BOT and vallecula random biopsies 02/20/19 which only showed chronic inflammation and no malignancy.  He had a colonoscopy by Dr. Linton Flemings on 02/27/19 showing only a tubular adenoma in the sigmoid colon.  Overall, it was felt that his right mid back lesion is the site of his primary disease, and the metastatic melanoma pathology interpretation may have been complicated due to repeat I&Ds and steroid injections that may have altered the architecture of the primary lesion.  In the absence of any confirmed metastatic disease, he underwent WLE and LND with Dr. Duanne Moron on 03/03/19.  Final path showed a 30 mm thick melanoma, 2 mitoses/mm2, non-brisk TILs, no tumor regression, LVI+, microsatellites+, negative margins, and 3/35 lymph nodes (largest LN 4.5 x 3 cm) were positive for melanoma with presence of lymphatic invasion without extranodal extension.He uses a cane sometimes as he has some balance issues due to neuropathy in his feet.  Otherwise, he is fully functional, active, and independent in ADLs/iADLs.  No hx of autoimmune disease.03/27/19 signed  consent for the Moderna trial; randomized to pembro and vaccine arm.  Started treatment C1D1 05/08/19.04/24/19 noted to have wound dehiscence, non-infected, healing by secondary intent.  Re-instated VNA services to assist with dressing changes and packing.  Wound completely healed by 06/20/19.10/27/19 surveillance scans NED.Interval ZO:XWRU returns for follow-up today and C17 of his clinical trial.Hasn't been taking his BPs at home as he had been in [facility].Since his last treatment 3 weeks ago, he notes the following?issues/updates:?GI:-Diarrhea: None recently.  Not related to study Rx.-taste changes/loss of smell and appetite (06/22/19) resolved 08/21/19 back to baseline.  Has some recurrence of sx since (09/25/19) -- still improved from March; remains resolved to baseline.-ongoing occasional gas pains Gr1 with sensation of bloating, intermittent relieved with flatus; stable?Resp:-DOE with walking long distances about a quarter of a mile (pre-dated tx on trial); slightly better given more recent exercise?Extremities:- lower extremity swelling stable and minimal?Metabolic-?long-acting?insulin dose 80 units qhs.  Fasting BG have been much better.- fatigue?(Gr1)?since 3/11 which waxes and wanes; slightly improved with exercise. ?Insomnia is improved; still taking his chronic Remus Loffler Denies any fever. ?ECOG:  1Pain: 0Review of Systems:No fevers, chills, night sweats, lightheadedness, HA, CP, palpitations, wheezing, abd pain, constipation, blood in the urine or stools, dysuria.  Diabetic neuropathy in the hands and feet stable.  He has a prior hx of intermittent loose stools, chronic, without blood depending on his diet (often isolated to a specific restaurant he likes to visit with his buddies).  Positive responses on ROS as noted in interval hx. PAST MEDICAL HISTORY:Past Medical History: Diagnosis Date ? Diabetes mellitus (HC Code) (HC CODE)  ? High cholesterol  ? Malignant melanoma of torso excluding breast (HC Code) (HC CODE)  SURGICAL HISTORY:Past Surgical History: Procedure Laterality Date ? COLONOSCOPY   ? CORONARY ANGIOPLASTY WITH STENT PLACEMENT Bilateral  ? laryngoscopy with biopsy  02/20/2019 ? ROTATOR CUFF REPAIR Left 1999 ? TONSILLECTOMY    age 42 ? TOTAL HIP ARTHROPLASTY Right 2002 FAMILY HISTORY:Family History Problem Relation Age of Onset ? Heart disease Mother  ? Bladder cancer Father 44 ? Kidney cancer Sister  ? Leukemia Brother 74 ? No Known Problems Daughter  ? No Known Problems Son  SOCIAL HISTORY:Social History Socioeconomic History ? Marital status: Divorced   Spouse name: Not on file ? Number of children: Not on file ? Years of education: Not on file ? Highest education level: Not on file Occupational History ? Not on file Tobacco Use ? Smoking status: Never Smoker ? Smokeless tobacco: Never Used Vaping Use ? Vaping Use: Never used Substance and Sexual Activity ? Alcohol use: Not Currently ? Drug use: No ? Sexual activity: Not on file Other Topics Concern ? Not on file Social History Narrative  Divorced, works for [employer] in [place] will be retiring 03/17/19.  Has 1 son in [place] and 1 daughter in [place]. Llives in [place]  Social Determinants of Health Financial Resource Strain:  ? Difficulty of Paying Living Expenses:  Food Insecurity:  ? Worried About Programme researcher, broadcasting/film/video in the Last Year:  ? Barista in the Last Year:  Transportation Needs:  ? Freight forwarder (Medical):  ? Lack of Transportation (Non-Medical):  Physical Activity:  ? Days of Exercise per Week:  ? Minutes of Exercise per Session:  Stress:  ? Feeling of Stress :  Social Connections:  ? Frequency of Communication with Friends and Family:  ? Frequency of Social Gatherings with Friends and Family:  ? Attends Religious Services:  ?  Active Member of Clubs or Organizations:  ? Attends Banker Meetings:  ? Marital Status:  Intimate Partner Violence:  ? Fear of Current or Ex-Partner:  ? Emotionally Abused: ? Physically Abused:  ? Sexually Abused:  ALLERGIES:No Known AllergiesMEDICATIONS:Current Outpatient Medications Medication Sig ? acetaminophen (TYLENOL) 325 mg tablet Take 3 tablets (975 mg total) by mouth every 6 (six) hours as needed (mild to moderate pain). ? aspirin 81 MG EC tablet Take 81 mg by mouth nightly.  ? docusate sodium (COLACE) 250 mg capsule Take 1 capsule (250 mg total) by mouth 2 (two) times daily as needed for constipation. ? FREESTYLE LIBRE 14 DAY sensor kit 1 each by Other route every 14 (fourteen) days. ? glipiZIDE (GLUCOTROL XL) 10 MG 24 hr tablet Take 10 mg by mouth nightly.  ? ibuprofen (ADVIL,MOTRIN) 200 mg tablet Take 2 tablets (400 mg total) by mouth every 6 (six) hours as needed (alternate with tylenol for mild to moderate pain). ? simvastatin (ZOCOR) 20 MG tablet Take 20 mg by mouth nightly.  ? TOUJEO SOLOSTAR U-300 300 unit/mL (1.5 mL) pen Inject 30 Units under the skin nightly. ? vit A/vit C/vit E/zinc/copper (PRESERVISION AREDS ORAL) Take by mouth 2 (two) times daily. ? zolpidem (AMBIEN) 10 mg tablet nightly.. ? ALPRAZolam (XANAX) 0.5 mg tablet 0.5 mg.  (Patient not taking: Reported on 04/06/2020) ? olopatadine (PATANOL) 0.1 % ophthalmic solution as needed (takes for eye irritation as needed).  (Patient not taking: Reported on 04/06/2020) No current facility-administered medications for this visit. Facility-Administered Medications Ordered in Other Visits Medication ? albuterol neb sol 2.5 mg/3 mL (0.083%) (PROVENTIL,VENTOLIN) ? diphenhydrAMINE (BENADRYL) injection 50 mg ? EPINEPHrine (EPI-PEN) auto-injector 0.3 mg ? EPINEPHrine (EPI-PEN) auto-injector 0.3 mg ? famotidine (PF) (PEPCID) 20 mg in sodium chloride 0.9 % 5 mL Injection ? hydrocortisone sodium succinate (Solu-CORTEF) injection 100 mg ? sodium chloride 0.9 % (new bag) bolus 500 mL VITALS: BP (!) (P) 172/92 (Site: r a, Position: Sitting, Cuff Size: Large)  - Pulse (P) 87  - Temp (P) 97.2 ?F (36.2 ?C) (Temporal)  - Resp (P) 20  - Wt (P) 121.7 kg  - SpO2 (P) 97%  - BMI (P) 38.32 kg/m?  Repeat BP 144/87PHYSICAL EXAM:Gen:  NAD, pleasant HEENT:  EOMI, PEARLLN:  No cervical, supraclavicular, axillary, or inguinal LAD. CV:  No murmurs, tachy, no m/r/g Pulm:  CTA b/lAbd:  Soft, no tenderness, no guarding or rebound, no organomegaly, no ascitesExt:  WWP, 1+ symmetric edema in the ankles.  Lymphedema present in the right arm, unchanged.Neuro:   Grossly intactPsych:  Normal mood and affectSkin:  Scar on the right upper back well healed without evidence of recurrence. Small lateral skin puckering at the edges of the scar stable.  Some flaking skin/dryness in the middle portion of the scar.  Right axillary scar well healed.LABS:Results for orders placed or performed during the hospital encounter of 04/06/20 MAGNESIUM Result Value Ref Range  Magnesium 1.9 1.7 - 2.4 mg/dL Phosphorus     (BH GH L LMW YH) Result Value Ref Range  Phosphorus 3.0 2.2 - 4.5 mg/dL Lactate dehydrogenase Result Value Ref Range  LD 208 122 - 241 U/L PT/INR and PTT     (BH GH L YH) Result Value Ref Range  Prothrombin Time 10.7 9.8 - 11.4 seconds  INR 1.01 0.92 - 1.08  PTT 25.9 22.7 - 28.9 seconds TSH w/reflex to FT4     (BH GH LMW Q YH) Result Value Ref Range  Thyroid Stimulating Hormone  3.580 See Comment ?IU/mL T3 Result Value Ref Range  T3, Total 92.2 See Comment ng/dL T4, free Result Value Ref Range  Free T4 0.83 See Comment ng/dL TSH Result Value Ref Range  Thyroid Stimulating Hormone 3.580 See Comment ?IU/mL CBC auto differential Result Value Ref Range  WBC 7.8 4.0 - 11.0 x1000/?L  RBC 5.08 4.00 - 6.00 M/?L  Hemoglobin 14.8 13.2 - 17.1 g/dL  Hematocrit 30.86 57.84 - 50.00 %  MCV 90.0 80.0 - 100.0 fL  MCH 29.1 27.0 - 33.0 pg  MCHC 32.4 31.0 - 36.0 g/dL  RDW-CV 69.6 29.5 - 28.4 %  Platelets 245 150 - 420 x1000/?L  MPV 10.2 8.0 - 12.0 fL  Neutrophils 65.9 39.0 - 72.0 %  Lymphocytes 21.7 17.0 - 50.0 %  Monocytes 8.0 4.0 - 12.0 %  Eosinophils 3.3 0.0 - 5.0 %  Basophil 0.8 0.0 - 1.4 %  Immature Granulocytes 0.3 0.0 - 1.0 %  nRBC 0.0 0.0 - 1.0 %  ANC(Abs Neutrophil Count) 5.17 2.00 - 7.60 x 1000/?L  Absolute Lymphocyte Count 1.70 0.60 - 3.70 x 1000/?L  Monocyte Absolute Count 0.63 0.00 - 1.00 x 1000/?L  Eosinophil Absolute Count 0.26 0.00 - 1.00 x 1000/?L  Basophil Absolute Count 0.06 0.00 - 1.00 x 1000/?L  Absolute Immature Granulocyte Count 0.02 0.00 - 0.30 x 1000/?L  Absolute nRBC 0.00 0.00 - 1.00 x 1000/?L Comprehensive metabolic panel Result Value Ref Range  Sodium 139 136 - 144 mmol/L  Potassium 5.1 3.3 - 5.3 mmol/L  Chloride 101 98 - 107 mmol/L  CO2 25 20 - 30 mmol/L  Anion Gap 13 7 - 17  Glucose 136 (H) 70 - 100 mg/dL  BUN 22 8 - 23 mg/dL  Creatinine 1.32 4.40 - 1.30 mg/dL  Calcium 9.0 8.8 - 10.2 mg/dL  BUN/Creatinine Ratio 72.5 (H) 8.0 - 23.0  Total Protein 7.9 6.6 - 8.7 g/dL  Albumin 4.2 3.6 - 4.9 g/dL  Total Bilirubin 0.3 <=3.6 mg/dL  Alkaline Phosphatase 77 9 - 122 U/L  Alanine Aminotransferase (ALT) 24 9 - 59 U/L  Aspartate Aminotransferase (AST) 26 10 - 35 U/L  Globulin 3.7 (H) 2.3 - 3.5 g/dL  A/G Ratio 1.1 1.0 - 2.2  AST/ALT Ratio 1.1 See Comment  eGFR (Afr Amer) >60 >60 mL/min/1.26m2  eGFR (NON African-American) >60 >60 mL/min/1.44m2 IMAGING/PATH:[place] abd/pelvis 01/12/20:  NEDCT chest 01/12/20:  1.  Interval increase in size of a left subpectoral lymph node as described, nonspecific but could represent metastatic disease. Otherwise no enlarging lymph nodes identified.2.  Multiple pulmonary nodules measuring up to 5 mm, stable since  [place] chest dated 04/17/2019. Follow-up as per oncologic protocol is recommended.MRI Brain and [place] CAP 04/17/19:  NEDPET 02/07/19:Head And Neck: Base of tongue and preepiglottic FDG activity is nonspecific (SUV max 6.6).Mildly FDG avid cervical lymph nodes are nonspecific, for example:*  Left level 2 ([place] image 874, 1.4 x 0.8 cm, SUV max 2.6)*  Right level 2 ([place] image 878, 1.4 x 1 cm, SUV max 2.1)CHEST:Hypermetabolic mass overlying the right posterior 10th rib is consistent with malignancy, and involves the skin, subcutaneous fat, extending to the latissimus dorsi muscle (SUV max 15, 6.3 x 3 x 5.9 cm).Two hypermetabolic right axillary lymph nodes are highly suspicious for metastases (up to SUV max 8.3, 2.4 x 1.7 cm). Multiple additional smaller right subpectoral lymph nodes are minimally FDG avid, and are indeterminate (SUV max 1.2, subcentimeter).Azygos pulmonary fissure is incidentally noted. No suspicious pulmonary nodule is identified, although pulmonary detail is  partially obscured by breathing motion. Coronary arteries and the aortic valve are calcified. The heart is on the upper bound of normal size. Nonspecific pattern of myocardial FDG activity. No mediastinal lymphadenopathy is identified.Abdomen/Pelvis: Diffuse hepatic and splenic heterogeneity most likely represents artifactual image noise, however this could conceal any small lesions.Multiple colonic diverticuli show no evidence of diverticulitis. Diffuse patchy colonic FDG activity is nonspecific, commonly inflammatory or related to a pharmacologic effect, however this could conceal any small lesions (SUV max 9 the sigmoid). Left renal cortical defect associated with a punctuate calcification likely represents scarring. Right renal calyceal calcifications likely represent nonobstructing stones (up to 0.5 cm).Normal appearance of the gallbladder, spleen, stomach, pancreas, adrenal glands, small bowel, decompressed urinary bladder.Streak artifacts arising from metallic right hip prostheses obscure nearby structures.Small retroperitoneal lymph nodes are not FDG avid, although below resolution of PET. Otherwise, no retroperitoneal, mesenteric, or pelvic lymphadenopathy is identified. Inguinal lymph nodes are nonspecific (SUV max 1.5), commonly inflammatory.MUSCULOSKELETAL:Heterogeneous marrow FDG activity is likely due to a combination of artifactual image noise, and marrow activation, however this reduces sensitivity for any small lesions (SUV max 4.6 in the sacrum). Right knee FDG activity associated with cruciate ligaments and synovium is likely degenerative or posttraumatic (SUV max 5), with small knee effusion (SUV max 3.3).IMPRESSION:1.  The accession number [id number] does not have any associated images in PACS at the time of dictation.  PET-[place] images associated with the accession number [id number] / [id number] were interpreted in this report.2.  Hypermetabolic mass overlying the right posterior 10th rib is consistent with malignancy, and involves the skin, subcutaneous fat, extending to the latissimus dorsi muscle.3.  Right axillary nodal metastases.4.  Multiple small right subpectoral lymph nodes are indeterminate; metastases are not excluded.5.  Base of tongue and preepiglottic FDG activity is nonspecific, with slight asymmetric fullness on the left. Direct visualization is recommended to exclude primary neoplasm.6.  Minimally FDG avid bilateral level 2 cervical lymph nodes are nonspecific, possibly inflammatory.MRI brain 01/30/19:There is no intracranial hemorrhage or major vascular distribution infarct.No abnormal enhancement is seen.There is no mass effect, edema, or midline shift.The ventricles are symmetric and normal in size. Several scattered foci of T2/FLAIR hyperintense signal are noted in the periventricular and subcortical white matter, likely sequela of chronic small vessel ischemic disease. No extra-axial collection is seen.Prior lens replacements noted bilaterally.The paranasal sinuses are clear. Small right mastoid effusion noted.IMPRESSION:No evidence of intracranial metastatic disease.WLE and SLN 03/03/19:LYMPH NODES, RIGHT AXILLARY LEVELS 1, 2 AND 3, LYMPH NODE DISSECTION: ?  ? ? - ?THREE OF THIRTY-FIVE LYMPH NODES, POSITIVE FOR MELANOMA (3 OF 35) WITH LARGEST INVOLVED LYMPH NODE MEASURING 4.5 X 3 CM ? ? ?- LYMPHATIC INVASION IDENTIFIED ? ? ?- NO EXTRANODAL EXTENSION IDENTIFIED 1. ?SKIN, BACK, RIGHT UPPER, EXCISION: ? ? ? ?- MALIGNANT MELANOMA, SEE NOTE AND SYNOPTIC SUMMARY Note: Sections show a tumor composed of S100 positive epithelioid cells within the dermis and subcutaneous tissue. Cytokeratin AE1/AE3 and desmin are negative. A junctional component is not identified. The findings would be compatible with a primary dermal melanoma or a metastatic lesion. Clinical correlation is suggested. 2. ?SKIN, BACK, EXCISION: ? ? ? ?- BENIGN FIBROADIPOSE TISSUE AND SKELETAL MUSCLE SYNOPTIC SUMMARY MELANOMA OF THE SKIN Tumor Site: ? ? Back Laterality: ? ? Right Procedure: ? ? Primary excision Maximum Tumor Thickness (Depth): ? ? 30 mm Ulceration: ? ? Not identified Mitotic Rate (Mitoses/mm2): ? ? 2 Anatomic Level: ? ? V (Melanoma invades subcutis) Growth Phase: ? ? Vertical Histologic Type: ? ?  Melanoma, NOS Tumor-Infiltrating Lymphocytes: ? ? Present, non-brisk Tumor Regression: ? ? Not identifed Lymphovascular Invasion: ? ? Present Microsatellitosis: ? ? Present Margins ? ? ?Peripheral Margins: ? ? Uninvolved Deep Margin: ? ? Uninvolved Stage (AJCC 8th Ed): ? ? pT4a Nx Base of the tongue and vallecula random biopsies 02/20/19: 1. ?TONGUE, LEFT, BASE, BIOPSY: ? ? ? ?- LYMPHOID TISSUE HYPERPLASIA ? ? ?- NEGATIVE FOR MALIGNANCY 2. ?THROAT, VALLECULA, BIOPSY: ? ?  ? - LYMPHOID TISSUE HYPERPLASIA AND FOCAL CHRONIC INFLAMMATION. SEE NOTE. ? ? ?- NEGATIVE FOR MALIGNANCY ? ? ? ? ? Note: Immunostain of Sox-10 to investigate focal pigment is negative, supporting above interpretation. 3. ?THROAT, LEFT VALLECULA, BIOPSY: ? ? ? ?- LYMPHOID TISSUE HYPERPLASIA ? ? ?- NEGATIVE FOR MALIGNANCY 4. ?THROAT, RIGHT VALLECULA, BIOPSY: ? ? ? ? ? ? - LYMPHOID TISSUE HYPERPLASIA AND FOCAL CHRONIC INFLAMMATION ? ? ?- NEGATIVE FOR MALIGNANCY 5. ?TONGUE, RIGHT BASE, BIOPSY: ? ? ? ? ? ? - LYMPHOID TISSUE HYPERPLASIA AND FOCAL CHRONIC INFLAMMATION ? ? ?- NEGATIVE FOR MALIGNANCY Pathology (reviewed at [facility]), collected 01/07/19:DIAGNOSIS: ? 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 Clinical pathological correlation is recommended. MICROSCOPIC DESCRIPTION: There are atypical cells in a nodule in the dermis. The cells are positive with SOX-10 and by report are negative with LCA, Kappa, Lambda, and a cytokeratin. The cells are also positive with S-100 by report.ASSESSMENT/PLAN:Jason Rowland is a 74 y.o. 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  Initially path read as metastatic melanoma, but further workup confirms that this is the primary site, not a metastatic lesion.  Initial biopsy was likely altered due to repeat I&Ds of the area.  He had a FBSE with his dermatologist and no additional primary lesions were identified.  He is otherwise in good health except for metabolic syndrome and diabetes-associated neuropathy.  PET 02/07/19 showed the main lesion on the right upper back extending to the latissimus dorsi muscle with involved right axillary nodes and nonspecific avidity in the BOT and preepiglottis for which direct visual evaluation was recommended and done 02/11/19 and random biopsies 02/20/19 without concerns for malignancy. Colonoscopy was also done to evaluate stranding in the transverse colon, which was negative for malignancy on exam.  Now s/p WLE and LND with Dr. Duanne Moron on 03/03/19 showing a stage IIIC melanoma that was 30 mm thick, positive for LVI and microsatellites with 3/25 positive LNs.  Given the thickness of his primary melanoma and microsatellitosis, we felt that his melanoma-specific survival is closer to a stage IIID:  60% over 10 years with stage IIIC disease versus 24% with stage IIID.  He does not have a BRAF mutation, so SOC options are immune therapy at this point versus the Moderna clinical trial (?A Phase 2 Randomized Study of Adjuvant Immunotherapy with the Personalized Cancer Vaccine [phone] and Pembrolizumab Versus Pembrolizumab Alone After Complete Resection of High-Risk Melanoma?) vs active surveillance (for which he would be extremely high risk for relapsing).  He opted for the Moderna trial and signed consent on 03/27/19; tissue passed QC inspection.  Jason Rowland was randomized to the [facility] with vaccine arm of the trial.  He has experienced some weight loss due to loss of taste/smell/appetite after initiation the clinical trial, which is now resolved for the past several months with weight gain. He has stable  right arm lymphedema but declines any lymphedema therapy or sleeves.  He also had some G2 diarrhea after C4, which resolved with Imodium and not requiring anti-diarrheals by C7.  Energy is improved after starting an exercise regimen.- labs reviewed today.  LDH wnl. TSH nl.  Ok for treatment C17 today. Will get pembrolizumab only.- [place] CAP 7/12 NED; [place] CAP 01/12/20 showing a non-specific 1.1 cm left subpectoral LN likely related to COVID-19 vaccination (no longer palpable on subsequent exams), no definitive evidence of metastatic disease; repeat surveillance scans every 12 weeks.  He missed his appt for scans yesterday; now rescheduled for 04/24/19- routine f/u with dermatologist every 4-6 months.  Last seen 12/23/19.  Has another appt coming up soon.- RTC in 3 weeks per trial schedule; instructed to call if any issues or questions in the interim20 min were spent in direct patient contact.  He has our clinic contact information and instructed to call if questions.  Drema Halon, MD, PhDMedical Oncology

## 2020-04-20 NOTE — Progress Notes
HIC # [id number] Cycle 17, Day 1 Patient seen and examined by Drema Halon, MDLabs and toxicity assessment reviewed by Drema Halon, MDAll labs are deemed NCS today per Drema Halon, MDConcomitant medications reviewed and updated.Calendar was reviewed with patient and they will RTC per protocolApproved for treatment today per Drema Halon, MD  ECOG PS = 1  Concomitant medications: MEDICATION SINGLE DOSE UNITS ROUTE FREQUECY START DATE STOP DATE INDICATION Aspirin 81 mg PO QD 2010 ongoing PPX CVD Simvastatin 20 Mg PO QD 2012 ongoing Hypercholesterolemia  Ambien 10 mg PO QD 2016 ongoing insomnia  Glucoside 10 mg PO QD 2014 ongoing diabetes  Toujeo Solostar  80 Units SQ QD  2016  07/08/19 diabetes  guaifenesin 400 mg PO q 4 hours prn 07/10/19 08/16/19 cough  imodium 2 Mg PO PRN 07/19/19 ongoing diarrhea Toujeo Solostar 40-50 units subcutaneous QD 07/09/19 10/01/19 diabetes  Toujeo Solostar 60 units SQ QD 10/02/19  01/14/20 diabetes Cefalexin 250 Mg Oral TID 12/24/19 01/02/20 PPX Infection  Moderna COVID Booster 1  Injection IM Once 01/10/20 01/10/20 PPX COVID-19  Toujeo Solostar  70 Units subcutaneous  QD  01/15/20 03/15/20 Diabetes Lispro 5 Units SQ Once 02/03/20 02/03/20 diabetes  Toujeo Solostar  80 Units subcutaneous  QD  03/16/20 ongoing Diabetes Areds Preservision 1 Tab PO BID 03/16/20 Ongoing Vision supplement   Medical History:                                                         PATIENT NAME: Jason Rowland  STUDY ID:  [id number]     0=NA                      1= Unrelated         2= Unlikely 3=Possible         4= Probable           5. Definite (Circ[place] One) 0= No Action Taken                                         1= Con Med                                                            2=Dose Modified                                        3=Dose Delayed                                                         4= Patient Hospitalized 5= Patient Taken Off Study                    6=Non Drug Therapy  7=Other (Specify) 1=Recovered                       2=Still under txmt/observation                              3=Alive w/Sequelae                                                                 4=Died                         5=Resolvd to Baseline             6=Grade Change       Medical History                       ADVERSE EVENT  Is AE Intermittent? Y/N SAE             Y/N Grade Expect PEMBRO Y/N ?Relation to [facility] Expect mRNA            Y/N ?Relation to mRNA Start Date End Date/ or ?Ongoing Action Taken               Designer, jewellery # above) AE OUTCOME   Hypercholesterolemia N N 1 Med Hx Med Hx Med Hx Med Hx 2012 ongoing 1= simvastatin 2   diabetes N N 1 Med Hx Med Hx Med Hx Med Hx 2010 ongoing 1- glucoside; toujeo 2   Insomnia N N 1 Med Hx Med Hx Med Hx Med Hx 2016 ongoing 1- ambien 2   Edema limbs N N 1 Med Hx  Med Hx Med Hx Med Hx  03/03/2019 ongoing 0 2   Diarrhea  N N 1 Y  1  N/A  N/A  05/27/2019 05/27/2019 0 5   fatigue   N N  1   Y 3   Y 3   06/26/19  ongoing  0 2    dysgeusia N N 1 N 2 N 2 06/22/19 08/21/19 0 2   anorexia N N 1 Y 2 Y 2 06/22/19 08/21/19 0 2   Pain, injection site Y N 1 N 1 Y 4 06/19/19 06/23/19 0 2   Eye disorders, other (left eye droop) N N 1 N 2 N 2 07/01/19 07/31/19 0 2   vomiting N N 1 Y 3 Y 3 07/08/19 07/08/19 0 1   dirrahea Y N 1 Y 3 Y 3 07/08/19 07/08/19 0 2   cough N N 1 Y 3 Y 3 07/08/19 08/16/19 1 = gueifenisen 2   ALT increased N N 1 Y 3 Y 3 07/10/19 07/31/19 0 2   AST increased  N N 1 Y 3 Y 3 07/10/19 07/31/19 0 2   Alkaline phos increased N N 2 Y 3 Y 3 07/10/19 07/31/19 0 2   Diarrhea  Y N 2 Y 3 Y 3 07/09/19 07/20/2019 1- imodium 1   Injection site pain Y N 1 N 1 Y 3 07/08/19 12/25/19 0 2   bloating Y N 1 N 1 N 1 07/08/19 ongoing 0 2   Bilateral foot neuropathy N N 1 N/A  N/A N/A N/A Baseline 02/2019 ongoing 0 2   Dyspnea on exertion Y N 1 N/A N/A N/A N/A Baseline 02/2019 ongoing 0 2   Diarrhea  N N 1 Y 1 Y 1 09/25/19  09/25/19 1- imodium 1   Anorexia  N N 1 Y 2 Y 2 10/02/19  11/13/19 0 2   Diarrhea  N N 1 Y 1 Y 1 10/16/19  10/17/19 0 1   diarrhea  N N 1 Y 1 Y 1 11/14/19  11/15/19 0 1   diarrhea  N N 1 Y 1 Y 1 01/10/20  01/12/20 0 1   diarrhea  N N 1 Y 1 Y 1 03/15/20  03/15/20 0 1                                                         Labs:Results for Jason Rowland, Jason Rowland (MRN [id number]) as of 04/06/2020 13:19 Ref. Range 04/06/2020 11:37 04/06/2020 11:37 Sodium Latest Ref Range: 136 - 144 mmol/L 139  Potassium Latest Ref Range: 3.3 - 5.3 mmol/L 5.1  Chloride Latest Ref Range: 98 - 107 mmol/L 101  CO2 Latest Ref Range: 20 - 30 mmol/L 25  Anion Gap Latest Ref Range: 7 - 17  13  BUN Latest Ref Range: 8 - 23 mg/dL 22  Creatinine Latest Ref Range: 0.40 - 1.30 mg/dL 4.09  BUN/Creatinine Ratio Latest Ref Range: 8.0 - 23.0  26.2 (H)  eGFR (NON African-American) Latest Ref Range: >60 mL/min/1.71m2 >60  eGFR (Afr Amer) Latest Ref Range: >60 mL/min/1.58m2 >60  Glucose Latest Ref Range: 70 - 100 mg/dL 811 (H)  Calcium Latest Ref Range: 8.8 - 10.2 mg/dL 9.0  Magnesium Latest Ref Range: 1.7 - 2.4 mg/dL 1.9  Phosphorus Latest Ref Range: 2.2 - 4.5 mg/dL 3.0  Total Bilirubin Latest Ref Range: <=1.2 mg/dL 0.3  Alkaline Phosphatase Latest Ref Range: 9 - 122 U/L 77  Alanine Aminotransferase (ALT) Latest Ref Range: 9 - 59 U/L 24  Aspartate Aminotransferase (AST) Latest Ref Range: 10 - 35 U/L 26  AST/ALT Ratio Latest Ref Range: See Comment  1.1  LD Latest Ref Range: 122 - 241 U/L 208  Total Protein Latest Ref Range: 6.6 - 8.7 g/dL 7.9  Albumin Latest Ref Range: 3.6 - 4.9 g/dL 4.2  Globulin Latest Ref Range: 2.3 - 3.5 g/dL 3.7 (H)  A/G Ratio Latest Ref Range: 1.0 - 2.2  1.1  T3, Total Latest Ref Range: See Comment ng/dL 91.4  Thyroid Stimulating Hormone, 3rd Gen. Latest Ref Range: See Comment ?IU/mL 3.580 3.580 Free T4 Latest Ref Range: See Comment ng/dL 7.82  WBC Latest Ref Range: 4.0 - 11.0 x1000/?L 7.8  RBC Latest Ref Range: 4.00 - 6.00 M/?L 5.08  Hemoglobin Latest Ref Range: 13.2 - 17.1 g/dL 95.6  Hematocrit Latest Ref Range: 38.50 - 50.00 % 45.70  MCV Latest Ref Range: 80.0 - 100.0 fL 90.0  MCH Latest Ref Range: 27.0 - 33.0 pg 29.1  MCHC Latest Ref Range: 31.0 - 36.0 g/dL 21.3  RDW-CV Latest Ref Range: 11.0 - 15.0 % 13.2  Platelets Latest Ref Range: 150 - 420 x1000/?L 245  MPV Latest Ref Range: 8.0 - 12.0 fL 10.2  Neutrophils Latest Ref Range: 39.0 - 72.0 % 65.9  Lymphocytes Latest Ref Range: 17.0 - 50.0 % 21.7  Monocytes Latest  Ref Range: 4.0 - 12.0 % 8.0  Eosinophils Latest Ref Range: 0.0 - 5.0 % 3.3  Basophils Latest Ref Range: 0.0 - 1.4 % 0.8  Immature Granulocytes Latest Ref Range: 0.0 - 1.0 % 0.3  nRBC Latest Ref Range: 0.0 - 1.0 % 0.0  ANC (Abs Neutrophil Count) Latest Ref Range: 2.00 - 7.60 x 1000/?L 5.17  Absolute Lymphocyte Count Latest Ref Range: 0.60 - 3.70 x 1000/?L 1.70  Monocytes (Absolute) Latest Ref Range: 0.00 - 1.00 x 1000/?L 0.63  Eosinophil Absolute Count Latest Ref Range: 0.00 - 1.00 x 1000/?L 0.26  Basophils Absolute Latest Ref Range: 0.00 - 1.00 x 1000/?L 0.06  Immature Granulocytes (Abs) Latest Ref Range: 0.00 - 0.30 x 1000/?L 0.02  nRBC Absolute Latest Ref Range: 0.00 - 1.00 x 1000/?L 0.00  Prothrombin Time Latest Ref Range: 9.8 - 11.4 seconds 10.7  INR Latest Ref Range: 0.92 - 1.08  1.01  PTT Latest Ref Range: 22.7 - 28.9 seconds 25.9

## 2020-04-23 ENCOUNTER — Telehealth: Admit: 2020-04-23 | Payer: PRIVATE HEALTH INSURANCE

## 2020-04-23 ENCOUNTER — Encounter: Admit: 2020-04-23 | Payer: PRIVATE HEALTH INSURANCE

## 2020-04-23 NOTE — Telephone Encounter
Email sent to research.

## 2020-04-23 NOTE — Telephone Encounter
Called [name] and confirmed that he has not been in contact with anyone confirmed or suspected of having COVID-19 in the past month in lieu of his scans being rescheduled.

## 2020-04-23 NOTE — Telephone Encounter
Patient is requesting to cancel his scan scheduled for today although he is a treatment patient. Please reach out to patient.

## 2020-04-24 ENCOUNTER — Ambulatory Visit: Admit: 2020-04-24 | Payer: PRIVATE HEALTH INSURANCE

## 2020-04-24 ENCOUNTER — Inpatient Hospital Stay: Admit: 2020-04-24 | Discharge: 2020-04-24 | Payer: BLUE CROSS/BLUE SHIELD

## 2020-04-24 DIAGNOSIS — Z006 Encounter for examination for normal comparison and control in clinical research program: Secondary | ICD-10-CM

## 2020-04-24 DIAGNOSIS — C4359 Malignant melanoma of other part of trunk: Secondary | ICD-10-CM

## 2020-04-24 MED ORDER — SODIUM CHLORIDE 0.9 % BOLUS (NEW BAG)
0.9 % | Freq: Once | INTRAVENOUS | Status: CP
Start: 2020-04-24 — End: ?
  Administered 2020-04-24: 15:00:00 0.9 mL/h via INTRAVENOUS

## 2020-04-24 MED ORDER — IOHEXOL 350 MG IODINE/ML INTRAVENOUS SOLUTION
350 mg iodine/mL | Freq: Once | INTRAVENOUS | Status: CP | PRN
Start: 2020-04-24 — End: ?
  Administered 2020-04-24: 15:00:00 350 mL via INTRAVENOUS

## 2020-04-27 ENCOUNTER — Inpatient Hospital Stay: Admit: 2020-04-27 | Discharge: 2020-04-27 | Payer: BLUE CROSS/BLUE SHIELD

## 2020-04-27 ENCOUNTER — Encounter: Admit: 2020-04-27 | Payer: PRIVATE HEALTH INSURANCE

## 2020-04-27 ENCOUNTER — Ambulatory Visit: Admit: 2020-04-27 | Payer: BLUE CROSS/BLUE SHIELD

## 2020-04-27 DIAGNOSIS — Z8249 Family history of ischemic heart disease and other diseases of the circulatory system: Secondary | ICD-10-CM

## 2020-04-27 DIAGNOSIS — E119 Type 2 diabetes mellitus without complications: Secondary | ICD-10-CM

## 2020-04-27 DIAGNOSIS — Z85820 Personal history of malignant melanoma of skin: Secondary | ICD-10-CM

## 2020-04-27 DIAGNOSIS — I890 Lymphedema, not elsewhere classified: Secondary | ICD-10-CM

## 2020-04-27 DIAGNOSIS — Z794 Long term (current) use of insulin: Secondary | ICD-10-CM

## 2020-04-27 DIAGNOSIS — C4359 Malignant melanoma of other part of trunk: Secondary | ICD-10-CM

## 2020-04-27 DIAGNOSIS — E8881 Metabolic syndrome: Secondary | ICD-10-CM

## 2020-04-27 DIAGNOSIS — Z7982 Long term (current) use of aspirin: Secondary | ICD-10-CM

## 2020-04-27 DIAGNOSIS — E7800 Pure hypercholesterolemia, unspecified: Secondary | ICD-10-CM

## 2020-04-27 DIAGNOSIS — E1142 Type 2 diabetes mellitus with diabetic polyneuropathy: Secondary | ICD-10-CM

## 2020-04-27 DIAGNOSIS — E785 Hyperlipidemia, unspecified: Secondary | ICD-10-CM

## 2020-04-27 DIAGNOSIS — Z79899 Other long term (current) drug therapy: Secondary | ICD-10-CM

## 2020-04-27 DIAGNOSIS — Z955 Presence of coronary angioplasty implant and graft: Secondary | ICD-10-CM

## 2020-04-27 DIAGNOSIS — Z006 Encounter for examination for normal comparison and control in clinical research program: Secondary | ICD-10-CM

## 2020-04-27 DIAGNOSIS — Z791 Long term (current) use of non-steroidal anti-inflammatories (NSAID): Secondary | ICD-10-CM

## 2020-04-27 DIAGNOSIS — Z8052 Family history of malignant neoplasm of bladder: Secondary | ICD-10-CM

## 2020-04-27 DIAGNOSIS — Z5112 Encounter for antineoplastic immunotherapy: Secondary | ICD-10-CM

## 2020-04-27 LAB — COMPREHENSIVE METABOLIC PANEL
BKR A/G RATIO: 1.1 (ref 1.0–2.2)
BKR ALANINE AMINOTRANSFERASE (ALT): 31 U/L (ref 9–59)
BKR ALBUMIN: 4.2 g/dL (ref 3.6–4.9)
BKR ALKALINE PHOSPHATASE: 79 U/L (ref 9–122)
BKR ANION GAP: 13 (ref 7–17)
BKR ASPARTATE AMINOTRANSFERASE (AST): 28 U/L (ref 10–35)
BKR AST/ALT RATIO: 0.9
BKR BILIRUBIN TOTAL: 0.4 mg/dL (ref <=1.2)
BKR BLOOD UREA NITROGEN: 20 mg/dL (ref 8–23)
BKR BUN / CREAT RATIO: 24.1 — ABNORMAL HIGH (ref 8.0–23.0)
BKR CALCIUM: 9.5 mg/dL (ref 8.8–10.2)
BKR CHLORIDE: 92 mmol/L — ABNORMAL LOW (ref 98–107)
BKR CREATININE: 0.83 mg/dL (ref 0.40–1.30)
BKR EGFR (AFR AMER): >60 mL/min/{1.73_m2} (ref >60)
BKR EGFR (NON AFRICAN AMERICAN): >60 mL/min/{1.73_m2} (ref >60)
BKR GLOBULIN: 3.9 g/dL — ABNORMAL HIGH (ref 2.3–3.5)
BKR GLUCOSE: 289 mg/dL — ABNORMAL HIGH (ref 70–100)
BKR POTASSIUM: 4.6 mmol/L (ref 3.3–5.3)
BKR PROTEIN TOTAL: 8.1 g/dL (ref 6.6–8.7)
BKR SODIUM: 132 mmol/L — ABNORMAL LOW (ref 136–144)

## 2020-04-27 LAB — CBC WITH AUTO DIFFERENTIAL
BKR CO2: 281 x1000/??L (ref 150–420)
BKR WAM ABSOLUTE IMMATURE GRANULOCYTES.: 0.04 x 1000/??L (ref 0.00–0.30)
BKR WAM ABSOLUTE LYMPHOCYTE COUNT.: 1.67 x 1000/ÂµL (ref 0.60–3.70)
BKR WAM ABSOLUTE NRBC (2 DEC): 0 x 1000/??L (ref 0.00–1.00)
BKR WAM ANALYZER ANC: 5.73 x 1000/??L (ref 2.00–7.60)
BKR WAM BASOPHIL ABSOLUTE COUNT.: 0.05 x 1000/ÂµL (ref 0.00–1.00)
BKR WAM BASOPHILS: 0.6 % — ABNORMAL HIGH (ref 0.0–1.4)
BKR WAM EOSINOPHIL ABSOLUTE COUNT.: 0.24 x 1000/??L (ref 0.00–1.00)
BKR WAM EOSINOPHILS: 2.9 % (ref 0.0–5.0)
BKR WAM HEMATOCRIT (2 DEC): 46.6 % (ref 38.50–50.00)
BKR WAM HEMOGLOBIN: 15.6 g/dL (ref 13.2–17.1)
BKR WAM IMMATURE GRANULOCYTES: 0.5 % (ref 0.0–1.0)
BKR WAM LYMPHOCYTES: 20.3 % (ref 17.0–50.0)
BKR WAM MCH (PG): 29.2 pg (ref 27.0–33.0)
BKR WAM MCHC: 33.5 g/dL (ref 31.0–36.0)
BKR WAM MCV: 87.1 fL (ref 80.0–100.0)
BKR WAM MONOCYTE ABSOLUTE COUNT.: 0.51 x 1000/ÂµL (ref 0.00–1.00)
BKR WAM MONOCYTES: 6.2 % (ref 4.0–12.0)
BKR WAM MPV: 10.8 fL (ref 8.0–12.0)
BKR WAM NEUTROPHILS: 69.5 % (ref 39.0–72.0)
BKR WAM NUCLEATED RED BLOOD CELLS: 0 % (ref 0.0–1.0)
BKR WAM PLATELETS: 281 x1000/??L (ref 150–420)
BKR WAM RDW-CV: 13.2 % — ABNORMAL LOW (ref 11.0–15.0)
BKR WAM RED BLOOD CELL COUNT.: 5.35 M/??L (ref 4.00–6.00)
BKR WAM WHITE BLOOD CELL COUNT: 8.2 x1000/??L (ref 4.0–11.0)

## 2020-04-27 LAB — T3: BKR T3 TOTAL: 79.5 ng/dL

## 2020-04-27 LAB — PT/INR AND PTT (BH GH L LMW YH)
BKR INR: 0.99 (ref 0.92–1.08)
BKR PARTIAL THROMBOPLASTIN TIME: 23.9 seconds — ABNORMAL HIGH (ref 22.7–28.9)
BKR PROTHROMBIN TIME: 10.5 seconds (ref 9.8–11.4)

## 2020-04-27 LAB — MAGNESIUM: BKR MAGNESIUM: 1.9 mg/dL (ref 1.7–2.4)

## 2020-04-27 LAB — PHOSPHORUS     (BH GH L LMW YH): BKR PHOSPHORUS: 3 mg/dL (ref 2.2–4.5)

## 2020-04-27 LAB — TSH: BKR THYROID STIMULATING HORMONE: 4.43 [IU]/mL — ABNORMAL HIGH

## 2020-04-27 LAB — LACTATE DEHYDROGENASE: BKR LACTATE DEHYDROGENASE: 218 U/L (ref 122–241)

## 2020-04-27 LAB — T4, FREE: BKR FREE T4: 0.85 ng/dL

## 2020-04-27 MED ORDER — DIPHENHYDRAMINE 50 MG/ML INJECTION SOLUTION
50 mg/mL | Freq: Once | INTRAVENOUS | Status: DC | PRN
Start: 2020-04-27 — End: 2020-05-02

## 2020-04-27 MED ORDER — ALBUTEROL SULFATE 2.5 MG/3 ML (0.083 %) SOLUTION FOR NEBULIZATION
2.5 mg /3 mL (0.083 %) | RESPIRATORY_TRACT | Status: DC | PRN
Start: 2020-04-27 — End: 2020-05-02

## 2020-04-27 MED ORDER — FAMOTIDINE 4 MG/ML IN 0.9% SODIUM CHLORIDE (ADULT)
Freq: Once | INTRAVENOUS | Status: DC | PRN
Start: 2020-04-27 — End: 2020-05-02

## 2020-04-27 MED ORDER — HIC 2000025461 PEMBROLIZUMAB (MK-3475)
Freq: Once | INTRAVENOUS | Status: CP
Start: 2020-04-27 — End: ?
  Administered 2020-04-27: 22:00:00 100.000 mL/h via INTRAVENOUS

## 2020-04-27 MED ORDER — MEPERIDINE 25 MG/2.5 ML IN 0.9% SODIUM CHLORIDE
Freq: Once | INTRAVENOUS | Status: DC | PRN
Start: 2020-04-27 — End: 2020-05-02

## 2020-04-27 MED ORDER — EPINEPHRINE 0.3 MG/0.3 ML INJECTION, AUTO-INJECTOR
0.3 mg/ mL | INTRAMUSCULAR | Status: DC | PRN
Start: 2020-04-27 — End: 2020-05-02

## 2020-04-27 MED ORDER — HYDROCORTISONE SODIUM SUCCINATE 100 MG SOLUTION FOR INJECTION
100 mg | Freq: Once | INTRAVENOUS | Status: DC | PRN
Start: 2020-04-27 — End: 2020-05-02

## 2020-04-27 MED ORDER — SODIUM CHLORIDE 0.9 % BOLUS (NEW BAG)
0.9 % | Freq: Once | INTRAVENOUS | Status: DC | PRN
Start: 2020-04-27 — End: 2020-05-02

## 2020-04-27 NOTE — Progress Notes
Pt presents to the clinic for cycle 18 day #1 of HIC [id number] + PembrolizumabPIV inserted and lab work drawn and processedPt evaluated per Dr. Jannette Fogo and B. Bengyak CRC Pt cleared for treatmentNursing assessment completed.Pt in good spirits regarding last treatmentPre pks drawn at 1705Pt received [id number] Pembrolizumab ([id number]) 200mg  in 100 ml investigational study drug from 1716  to 1746 to run over 30 minutes. + blood return noted from PIV pre and post infusion.  Pt tolerated wellPIV removed 2x2 and coban applied over site. Pt d/c to home in stable condition in care of self

## 2020-04-27 NOTE — Progress Notes
NAME:  Jason Rowland (1946-01-26)AGE: 75 y.o. MRN:  [id number] PROVIDER: Drema Halon, MD, PhDDATE OF SERVICE: 04/27/2020 [place] [facility] [place]  REASON FOR VISIT:  HIC 40981 treatment visitONC DX:  Invasive melanoma right mid backSTAGE:  IIIC (pT4a N3 M0); 30 mm with microsatellitosis, mitotic rate 2 per mm2.MOLECULAR PROFILING:0% tumor and immune cell PD-L1 staining50-gene panel:  DNA VARIANT DETECTED ? ? 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 ? 74% PRIOR TX:  WLE and SLNB 11/16/20ACTIVE TX:  Adjuvant Moderna clinical trial (?A Phase 2 Randomized Study of Adjuvant Immunotherapy with the Personalized Cancer Vaccine mRNA-4157 and Pembrolizumab Versus Pembrolizumab Alone After Complete Resection of High-Risk Melanoma? Protocol mRNA-4157-P201, HIC 19147) C1D1 05/08/19. Last dose 04/27/20. Randomized to the pembrolizumab and vaccine arm. Onc Hx: Mr. Owczarzak (prefers to be called Jason Rowland) is a 75 y.o. male who worked for 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  He reports it appeared approx 10 months prior; his PCP originally thought it was a pimple.  Dr. Lorn Junes at [facility] later evaluated it and also thought it was a pimple and injected it with intralesional kenalog x 2 and oral cephalexin without improvement.  The lesion increased in size and burned whenever he leaned against anything.  Denies night sweats and weight loss.  Lucie Leather did a I&D on 01/07/19  with only slight bloody discharge, so a punch biopsy was obtain with path showing metastatic melanoma.  On 01/20/19, additional biopsies were done to try to evaluate a primary site, but we do not have this tissue.  Per patient report, these biopsies were negative. MRI brain 01/30/19 without intracranial disease.  PET done 02/07/19 showing preepiglottic FDG activity s/p ENT evaluation which was negative for any visible malignancy.  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 LDH elevated to 251 at diagnosis. His case was presented at the [facility] Melanoma tumor board on 02/13/19 and consensus was to ask ENT for random biopsies of the BOT and pre-epiglottic PET-avid areas, as we do not want to miss a head and neck cancer but otherwise not felt to be related to his melanoma.  The cervical LN were not thought to be related.  He should also have gastroenterology evaluation for a PET-avid stranding lesion in the transverse colon.  Dr. Landis Gandy (ENT) performed BOT and vallecula random biopsies 02/20/19 which only showed chronic inflammation and no malignancy.  He had a colonoscopy by Dr. Linton Flemings on 02/27/19 showing only a tubular adenoma in the sigmoid colon.  Overall, it was felt that his right mid back lesion is the site of his primary disease, and the metastatic melanoma pathology interpretation may have been complicated due to repeat I&Ds and steroid injections that may have altered the architecture of the primary lesion.  In the absence of any confirmed metastatic disease, he underwent WLE and LND with Dr. Duanne Moron on 03/03/19.  Final path showed a 30 mm thick melanoma, 2 mitoses/mm2, non-brisk TILs, no tumor regression, LVI+, microsatellites+, negative margins, and 3/35 lymph nodes (largest LN 4.5 x 3 cm) were positive for melanoma with presence of lymphatic invasion without extranodal extension.He uses a cane sometimes as he has some balance issues due to neuropathy in his feet.  Otherwise, he is fully functional, active, and independent in ADLs/iADLs.  No hx of autoimmune  disease.03/27/19 signed consent for the Moderna trial; randomized to pembro and vaccine arm.  Started treatment C1D1 05/08/19.04/24/19 noted to have wound dehiscence, non-infected, healing by secondary intent. Re-instated VNA services to assist with dressing changes and packing.  Wound completely healed by 06/20/19.10/27/19, 01/12/20, and 04/24/20 surveillance scans NED.Interval ZO:XWRU returns for follow-up today and C18 of his clinical trial, which is the last treatment day.  Also here for scan review which were NED.  In the interval, he did go to [facility].Since his last treatment 3 weeks ago, he notes the following?issues/updates:?GI:-Diarrhea: None recently.  Not related to study Rx.-taste changes/loss of smell and appetite (06/22/19) resolved 08/21/19 back to baseline.  Has some recurrence of sx since (09/25/19) -- still improved from March; remains resolved to baseline.-ongoing occasional gas pains Gr1 with sensation of bloating, intermittent relieved with flatus; stable?Resp:-DOE with walking long distances about a quarter of a mile (pre-dated tx on trial); better given more recent exercise?Extremities:- lower extremity swelling stable and minimal?Metabolic-?long-acting?insulin dose decr to 70 units qhs.  - fatigue?(Gr1)?since 3/11 which waxes and wanes and sl better today; improved with exercise. ?Insomnia is improved; still taking his chronic Remus Loffler Denies any fever. ?ECOG:  1Pain: 0Review of Systems:No fevers, chills, night sweats, lightheadedness, HA, CP, palpitations, wheezing, abd pain, constipation, blood in the urine or stools, dysuria.  Diabetic neuropathy in the hands and feet stable.  He has a prior hx of intermittent loose stools, chronic, without blood depending on his diet (often isolated to a specific restaurant he likes to visit with his buddies).  Positive responses on ROS as noted in interval hx. PAST MEDICAL HISTORY:Past Medical History: Diagnosis Date ? Diabetes mellitus (HC Code) (HC CODE)  ? High cholesterol  ? Malignant melanoma of torso excluding breast (HC Code) (HC CODE)  SURGICAL HISTORY:Past Surgical History: Procedure Laterality Date ? COLONOSCOPY   ? CORONARY ANGIOPLASTY WITH STENT PLACEMENT Bilateral  ? laryngoscopy with biopsy  02/20/2019 ? ROTATOR CUFF REPAIR Left 1999 ? TONSILLECTOMY    age 79 ? TOTAL HIP ARTHROPLASTY Right 2002 FAMILY HISTORY:Family History Problem Relation Age of Onset ? Heart disease Mother  ? Bladder cancer Father 42 ? Kidney cancer Sister  ? Leukemia Brother 31 ? No Known Problems Daughter  ? No Known Problems Son  SOCIAL HISTORY:Social History Socioeconomic History ? Marital status: Divorced   Spouse name: Not on file ? Number of children: Not on file ? Years of education: Not on file ? Highest education level: Not on file Occupational History ? Not on file Tobacco Use ? Smoking status: Never Smoker ? Smokeless tobacco: Never Used Vaping Use ? Vaping Use: Never used Substance and Sexual Activity ? Alcohol use: Not Currently ? Drug use: No ? Sexual activity: Not on file Other Topics Concern ? Not on file Social History Narrative  Divorced, works for [employer] in [place] will be retiring 03/17/19.  Has 1 son in [place] and 1 daughter in [place]. Llives in [place]  Social Determinants of Health Financial Resource Strain:  ? Difficulty of Paying Living Expenses:  Food Insecurity:  ? Worried About Programme researcher, broadcasting/film/video in the Last Year:  ? Barista in the Last Year:  Transportation Needs:  ? Freight forwarder (Medical):  ? Lack of Transportation (Non-Medical):  Physical Activity:  ? Days of Exercise per Week:  ? Minutes of Exercise per Session:  Stress:  ? Feeling of Stress :  Social Connections:  ? Frequency of Communication with Friends and Family:  ?  Frequency of Social Gatherings with Friends and Family:  ? Attends Religious Services:  ? Active Member of Clubs or Organizations:  ? Attends Banker Meetings:  ? Marital Status:  Intimate Partner Violence:  ? Fear of Current or Ex-Partner:  ? Emotionally Abused:  ? Physically Abused:  ? Sexually Abused:  ALLERGIES:No Known AllergiesMEDICATIONS:Current Outpatient Medications Medication Sig ? aspirin 81 MG EC tablet Take 81 mg by mouth nightly.  ? FREESTYLE LIBRE 14 DAY sensor kit 1 each by Other route every 14 (fourteen) days. ? glipiZIDE (GLUCOTROL XL) 10 MG 24 hr tablet Take 10 mg by mouth nightly.  ? simvastatin (ZOCOR) 20 MG tablet Take 20 mg by mouth nightly.  ? TOUJEO SOLOSTAR U-300 300 unit/mL (1.5 mL) pen Inject 30 Units under the skin nightly. ? vit A/vit C/vit E/zinc/copper (PRESERVISION AREDS ORAL) Take by mouth 2 (two) times daily. ? zolpidem (AMBIEN) 10 mg tablet nightly.. ? acetaminophen (TYLENOL) 325 mg tablet Take 3 tablets (975 mg total) by mouth every 6 (six) hours as needed (mild to moderate pain). (Patient not taking: Reported on 04/27/2020) ? ALPRAZolam (XANAX) 0.5 mg tablet 0.5 mg.  (Patient not taking: Reported on 04/06/2020) ? docusate sodium (COLACE) 250 mg capsule Take 1 capsule (250 mg total) by mouth 2 (two) times daily as needed for constipation. (Patient not taking: Reported on 04/27/2020) ? ibuprofen (ADVIL,MOTRIN) 200 mg tablet Take 2 tablets (400 mg total) by mouth every 6 (six) hours as needed (alternate with tylenol for mild to moderate pain). (Patient not taking: Reported on 04/27/2020) ? olopatadine (PATANOL) 0.1 % ophthalmic solution as needed (takes for eye irritation as needed).  (Patient not taking: Reported on 04/06/2020) No current facility-administered medications for this visit. Facility-Administered Medications Ordered in Other Visits Medication ? albuterol neb sol 2.5 mg/3 mL (0.083%) (PROVENTIL,VENTOLIN) ? diphenhydrAMINE (BENADRYL) injection 50 mg ? EPINEPHrine (EPI-PEN) auto-injector 0.3 mg ? EPINEPHrine (EPI-PEN) auto-injector 0.3 mg ? famotidine (PF) (PEPCID) 20 mg in sodium chloride 0.9 % 5 mL Injection ? HIC [id number] Pembrolizumab (MK-3475) 200 mg in sodium chloride 0.9% 100 mL investigational study drug ? hydrocortisone sodium succinate (Solu-CORTEF) injection 100 mg ? meperidine PF (DEMEROL) 25 mg in sodium chloride 0.9 % 2.5 mL Injection ? sodium chloride 0.9 % (new bag) bolus 500 mL VITALS: BP 137/85  - Pulse 90  - Temp 97.1 ?F (36.2 ?C) (Temporal)  - Resp 18  - Wt 122.3 kg  - SpO2 98%  - BMI 38.51 kg/m?  PHYSICAL EXAM:Gen:  NAD, pleasant HEENT:  EOMI, PEARLLN:  No cervical, supraclavicular, axillary, or inguinal LAD. CV:  No murmurs, tachy, no m/r/g Pulm:  CTA b/lAbd:  Soft, no tenderness, no guarding or rebound, no organomegaly, no ascitesExt:  WWP, 1+ symmetric edema in the ankles.  Lymphedema present in the right arm, unchanged.Neuro:   Grossly intactPsych:  Normal mood and affectSkin:  Scar on the right upper back well healed without evidence of recurrence. Small lateral skin puckering at the edges of the scar stable.  Flaking skin/dryness in the middle portion of the scar.  Right axillary scar well healed.LABS:Results for orders placed or performed during the hospital encounter of 04/27/20 MAGNESIUM Result Value Ref Range  Magnesium 1.9 1.7 - 2.4 mg/dL Phosphorus     (BH GH L LMW YH) Result Value Ref Range  Phosphorus 3.0 2.2 - 4.5 mg/dL Lactate dehydrogenase Result Value Ref Range  LD 218 122 - 241 U/L PT/INR and PTT     (BH GH L YH) Result  Value Ref Range  Prothrombin Time 10.5 9.8 - 11.4 seconds  INR 0.99 0.92 - 1.08  PTT 23.9 22.7 - 28.9 seconds TSH Result Value Ref Range  Thyroid Stimulating Hormone 4.430 (H) See Comment ?IU/mL T4, free Result Value Ref Range  Free T4 0.85 See Comment ng/dL T3 Result Value Ref Range  T3, Total 79.5 See Comment ng/dL CBC auto differential Result Value Ref Range  WBC 8.2 4.0 - 11.0 x1000/?L  RBC 5.35 4.00 - 6.00 M/?L  Hemoglobin 15.6 13.2 - 17.1 g/dL Hematocrit 16.10 96.04 - 50.00 %  MCV 87.1 80.0 - 100.0 fL  MCH 29.2 27.0 - 33.0 pg  MCHC 33.5 31.0 - 36.0 g/dL  RDW-CV 54.0 98.1 - 19.1 %  Platelets 281 150 - 420 x1000/?L  MPV 10.8 8.0 - 12.0 fL  Neutrophils 69.5 39.0 - 72.0 %  Lymphocytes 20.3 17.0 - 50.0 %  Monocytes 6.2 4.0 - 12.0 %  Eosinophils 2.9 0.0 - 5.0 %  Basophil 0.6 0.0 - 1.4 %  Immature Granulocytes 0.5 0.0 - 1.0 %  nRBC 0.0 0.0 - 1.0 %  ANC(Abs Neutrophil Count) 5.73 2.00 - 7.60 x 1000/?L  Absolute Lymphocyte Count 1.67 0.60 - 3.70 x 1000/?L  Monocyte Absolute Count 0.51 0.00 - 1.00 x 1000/?L  Eosinophil Absolute Count 0.24 0.00 - 1.00 x 1000/?L  Basophil Absolute Count 0.05 0.00 - 1.00 x 1000/?L  Absolute Immature Granulocyte Count 0.04 0.00 - 0.30 x 1000/?L  Absolute nRBC 0.00 0.00 - 1.00 x 1000/?L Comprehensive metabolic panel Result Value Ref Range  Sodium 132 (L) 136 - 144 mmol/L  Potassium 4.6 3.3 - 5.3 mmol/L  Chloride 92 (L) 98 - 107 mmol/L  CO2 27 20 - 30 mmol/L  Anion Gap 13 7 - 17  Glucose 289 (H) 70 - 100 mg/dL  BUN 20 8 - 23 mg/dL  Creatinine 4.78 2.95 - 1.30 mg/dL  Calcium 9.5 8.8 - 62.1 mg/dL  BUN/Creatinine Ratio 30.8 (H) 8.0 - 23.0  Total Protein 8.1 6.6 - 8.7 g/dL  Albumin 4.2 3.6 - 4.9 g/dL  Total Bilirubin 0.4 <=6.5 mg/dL  Alkaline Phosphatase 79 9 - 122 U/L  Alanine Aminotransferase (ALT) 31 9 - 59 U/L  Aspartate Aminotransferase (AST) 28 10 - 35 U/L  Globulin 3.9 (H) 2.3 - 3.5 g/dL  A/G Ratio 1.1 1.0 - 2.2  AST/ALT Ratio 0.9 See Comment  eGFR (Afr Amer) >60 >60 mL/min/1.45m2  eGFR (NON African-American) >60 >60 mL/min/1.106m2 IMAGING/PATH:[place] Chest w IV ContrastResult Date: 1/10/2022Study: [place] CHEST W IV CONTRAST  Indication:  Melanoma, assess treatment response Comparison:  [place] from 01/12/2020 Technique: [place] of the chest was performed after intravenous administration of 80 cc of Omnipaque 350. FINDINGS: Scattered sub-4 mm lung nodules are stable, the largest in the left lower lobe measures 4 mm (image 162, series 5). No new or enlarging lung nodule is identified.  There is no thoracic lymphadenopathy or pleural effusion. No aggressive osseous lesion is seen. For findings below the diaphragms please see a separate Abdomen/Pelvis [place] report. Stable sub-4 mm lung nodules.  Reported And Signed By: Morene Rankins, MD  [facility] Radiology and Biomedical ImagingCT Abdomen Pelvis w IV ContrastResult Date: 1/9/2022CT ABDOMEN PELVIS W IV CONTRAST on 04/24/2020 9:55 AM INDICATION: Melanoma, monitor. COMPARISON: [place] ABDOMEN PELVIS W IV CONTRAST 2021-Sep-27 TECHNIQUE: [place] images of the abdomen and pelvis were obtained after the administration of 80 cc intravenous contrast (Omnipaque 350). Technical limitations: None. FINDINGS: Lung bases: Chest findings are reported separately. Hepatobiliary: Unremarkable. Stable sludge/gallstones  without any secondary findings of acute cholecystitis. Pancreas: Unremarkable. Spleen: Unremarkable. Adrenal glands: Unremarkable. Kidneys: Stable nonobstructing right renal calculi and left renal cortical scarring with parenchymal calcification. Bowel: No bowel obstruction. Fat-containing umbilical hernia is again noted. Abdominal and pelvic lymph nodes: No lymphadenopathy. Peritoneum: No ascites. Vasculature: No abdominal aortic aneurysm. Pelvis: No mass. Assessment is limited due to artifact from the patient's right hip arthroplasty. Small bilateral fat-containing inguinal hernias are incidentally noted. Musculoskeletal system and soft tissue: No aggressive osseous lesion. No evidence of metastatic disease in the abdomen/pelvis. Reported And Signed By: Germaine Pomfret, MD  [facility] Radiology and Biomedical ImagingWLE and SLN 03/03/19:LYMPH NODES, RIGHT AXILLARY LEVELS 1, 2 AND 3, LYMPH NODE DISSECTION: ?  ? ? - ?THREE OF THIRTY-FIVE LYMPH NODES, POSITIVE FOR MELANOMA (3 OF 35) WITH LARGEST INVOLVED LYMPH NODE MEASURING 4.5 X 3 CM ? ? ?- LYMPHATIC INVASION IDENTIFIED ? ? ?- NO EXTRANODAL EXTENSION IDENTIFIED 1. ?SKIN, BACK, RIGHT UPPER, EXCISION: ? ? ? ?- MALIGNANT MELANOMA, SEE NOTE AND SYNOPTIC SUMMARY Note: Sections show a tumor composed of S100 positive epithelioid cells within the dermis and subcutaneous tissue. Cytokeratin AE1/AE3 and desmin are negative. A junctional component is not identified. The findings would be compatible with a primary dermal melanoma or a metastatic lesion. Clinical correlation is suggested. 2. ?SKIN, BACK, EXCISION: ? ? ? ?- BENIGN FIBROADIPOSE TISSUE AND SKELETAL MUSCLE SYNOPTIC SUMMARY MELANOMA OF THE SKIN Tumor Site: ? ? Back Laterality: ? ? Right Procedure: ? ? Primary excision Maximum Tumor Thickness (Depth): ? ? 30 mm Ulceration: ? ? Not identified Mitotic Rate (Mitoses/mm2): ? ? 2 Anatomic Level: ? ? V (Melanoma invades subcutis) Growth Phase: ? ? Vertical Histologic Type: ? ? Melanoma, NOS Tumor-Infiltrating Lymphocytes: ? ? Present, non-brisk Tumor Regression: ? ? Not identifed Lymphovascular Invasion: ? ? Present Microsatellitosis: ? ? Present Margins ? ? ?Peripheral Margins: ? ? Uninvolved Deep Margin: ? ? Uninvolved Stage (AJCC 8th Ed): ? ? pT4a Nx Base of the tongue and vallecula random biopsies 02/20/19: 1. ?TONGUE, LEFT, BASE, BIOPSY: ? ? ? ?- LYMPHOID TISSUE HYPERPLASIA ? ? ?- NEGATIVE FOR MALIGNANCY 2. ?THROAT, VALLECULA, BIOPSY: ? ?  ? - LYMPHOID TISSUE HYPERPLASIA AND FOCAL CHRONIC INFLAMMATION. SEE NOTE. ? ? ?- NEGATIVE FOR MALIGNANCY ? ? ? ? ? Note: Immunostain of Sox-10 to investigate focal pigment is negative, supporting above interpretation. 3. ?THROAT, LEFT VALLECULA, BIOPSY: ? ? ? ?- LYMPHOID TISSUE HYPERPLASIA ? ? ?- NEGATIVE FOR MALIGNANCY 4. ?THROAT, RIGHT VALLECULA, BIOPSY: ? ? ? ? ? ? - LYMPHOID TISSUE HYPERPLASIA AND FOCAL CHRONIC INFLAMMATION ? ? ?- NEGATIVE FOR MALIGNANCY 5. ?TONGUE, RIGHT BASE, BIOPSY: ? ? ? ? ? ? - LYMPHOID TISSUE HYPERPLASIA AND FOCAL CHRONIC INFLAMMATION ? ? ?- NEGATIVE FOR MALIGNANCY Pathology (reviewed at [facility]), collected 01/07/19:DIAGNOSIS: ? 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 Clinical pathological correlation is recommended. MICROSCOPIC DESCRIPTION: There are atypical cells in a nodule in the dermis. The cells are positive with SOX-10 and by report are negative with LCA, Kappa, Lambda, and a cytokeratin. The cells are also positive with S-100 by report.ASSESSMENT/PLAN:Jason Rowland is a 75 y.o. 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  Initially path read as metastatic melanoma, but further workup confirms that this is the primary site, not a metastatic lesion.  Initial biopsy  was likely altered due to repeat I&Ds of the area.  He had a FBSE with his dermatologist and no additional primary lesions were identified.  He is otherwise in good health except for metabolic syndrome and diabetes-associated neuropathy.  PET 02/07/19 showed the main lesion on the right upper back extending to the latissimus dorsi muscle with involved right axillary nodes and nonspecific avidity in the BOT and preepiglottis for which direct visual evaluation was recommended and done 02/11/19 and random biopsies 02/20/19 without concerns for malignancy. Colonoscopy was also done to evaluate stranding in the transverse colon, which was negative for malignancy on exam.  Now s/p WLE and LND with Dr. Duanne Moron on 03/03/19 showing a stage IIIC melanoma that was 30 mm thick, positive for LVI and microsatellites with 3/25 positive LNs.  Given the thickness of his primary melanoma and microsatellitosis, we felt that his melanoma-specific survival is closer to a stage IIID:  60% over 10 years with stage IIIC disease versus 24% with stage IIID.  He does not have a BRAF mutation, so SOC options are immune therapy at this point versus the Moderna clinical trial (?A Phase 2 Randomized Study of Adjuvant Immunotherapy with the Personalized Cancer Vaccine [phone] and Pembrolizumab Versus Pembrolizumab Alone After Complete Resection of High-Risk Melanoma?) vs active surveillance (for which he would be extremely high risk for relapsing).  He opted for the Moderna trial and signed consent on 03/27/19; tissue passed QC inspection.  Jason Rowland was randomized to the [facility] with vaccine arm of the trial.  He has experienced some weight loss due to loss of taste/smell/appetite after initiation the clinical trial, which is now resolved for the past several months with weight gain. He has stable right arm lymphedema but declines any lymphedema therapy or sleeves.  He also had some G2 diarrhea after C4, which resolved with Imodium and not requiring anti-diarrheals by C7.  Energy is improved after starting an exercise regimen.- labs reviewed today.  LDH wnl. TSH mildly elevated with normal FT4 to monitor for now, NCS.  Ok for treatment C18 today - last treatment on the study. Will get pembrolizumab only.- [place] CAP 7/12 NED; [place] CAP 01/12/20 showing a non-specific 1.1 cm left subpectoral LN related to COVID-19 vaccination (no longer palpable on subsequent exams), no definitive evidence of metastatic disease. Scans 04/25/19 NED.  Next set of scans ordered to help with scheduling so Jason Rowland knows when he has to return for follow-up.- routine f/u with dermatologist every 4-6 months.  Last seen 12/23/19.  Has another appt coming up soon.- RTC 05/25/20 per trial schedule; instructed to call if any issues or questions in the interim20 min were spent in direct patient contact.  He has our clinic contact information and instructed to call if questions.  Drema Halon, MD, PhDMedical Oncology

## 2020-04-27 NOTE — Progress Notes
HIC # [id number] Cycle 18, Day 1Patient seen and examined by Drema Halon, MDLabs and toxicity assessment reviewed by Drema Halon, MDAll labs are deemed NCS today per Drema Halon, MDConcomitant medications reviewed and updated.Scans reviewed by Drema Halon, MDCalendar was reviewed with patient and they will RTC per protocolApproved for treatment today per Drema Halon, MD  ECOG PS = 1  Concomitant medications: MEDICATION SINGLE DOSE UNITS ROUTE FREQUECY START DATE STOP DATE INDICATION Aspirin 81 mg PO QD 2010 ongoing PPX CVD Simvastatin 20 Mg PO QD 2012 ongoing Hypercholesterolemia  Ambien 10 mg PO QD 2016 ongoing insomnia  Glucoside 10 mg PO QD 2014 ongoing diabetes  Toujeo Solostar  80 Units SQ QD  2016  07/08/19 diabetes  guaifenesin 400 mg PO q 4 hours prn 07/10/19 08/16/19 cough  imodium 2 Mg PO PRN 07/19/19 ongoing diarrhea Toujeo Solostar 40-50 units subcutaneous QD 07/09/19 10/01/19 diabetes  Toujeo Solostar 60 units SQ QD 10/02/19  01/14/20 diabetes Cefalexin 250 Mg Oral TID 12/24/19 01/02/20 PPX Infection  Moderna COVID Booster 1  Injection IM Once 01/10/20 01/10/20 PPX COVID-19  Toujeo Solostar  70 Units subcutaneous  QD  01/15/20 03/15/20 Diabetes Lispro 5 Units SQ Once 02/03/20 02/03/20 diabetes  Toujeo Solostar  80 Units subcutaneous  QD  03/16/20 04/26/20 Diabetes Areds Preservision 1 Tab PO BID 04/27/20 Ongoing Vision supplement  Toujeo Solostar  70 Units subcutaneous  QD  01/15/20 03/15/20 Diabetes   Medical History:                                                         PATIENT NAME: Jason Rowland  STUDY ID:  [id number]     0=NA                      1= Unrelated         2= Unlikely 3=Possible         4= Probable           5. Definite (Circle One) 0= No Action Taken                                         1= Con Med                                                            2=Dose Modified                                        3=Dose Delayed 4= Patient Hospitalized                                      5= Patient Taken Off Study                    6=Non Drug Therapy  7=Other (Specify) 1=Recovered                       2=Still under txmt/observation                              3=Alive w/Sequelae                                                                 4=Died                         5=Resolvd to Baseline             6=Grade Change       Medical History                       ADVERSE EVENT  Is AE Intermittent? Y/N SAE             Y/N Grade Expect PEMBRO Y/N ?Relation to [facility] Expect mRNA            Y/N ?Relation to mRNA Start Date End Date/ or ?Ongoing Action Taken               Designer, jewellery # above) AE OUTCOME   Hypercholesterolemia N N 1 Med Hx Med Hx Med Hx Med Hx 2012 ongoing 1= simvastatin 2   diabetes N N 1 Med Hx Med Hx Med Hx Med Hx 2010 ongoing 1- glucoside; toujeo 2   Insomnia N N 1 Med Hx Med Hx Med Hx Med Hx 2016 ongoing 1- ambien 2   Edema limbs N N 1 Med Hx  Med Hx Med Hx Med Hx  03/03/2019 ongoing 0 2   Diarrhea  N N 1 Y  1  N/A  N/A  05/27/2019 05/27/2019 0 5   fatigue   N N  1   Y 3   Y 3   06/26/19  ongoing  0 2    dysgeusia N N 1 N 2 N 2 06/22/19 08/21/19 0 2   anorexia N N 1 Y 2 Y 2 06/22/19 08/21/19 0 2   Pain, injection site Y N 1 N 1 Y 4 06/19/19 06/23/19 0 2   Eye disorders, other (left eye droop) N N 1 N 2 N 2 07/01/19 07/31/19 0 2   vomiting N N 1 Y 3 Y 3 07/08/19 07/08/19 0 1   dirrahea Y N 1 Y 3 Y 3 07/08/19 07/08/19 0 2   cough N N 1 Y 3 Y 3 07/08/19 08/16/19 1 = gueifenisen 2   ALT increased N N 1 Y 3 Y 3 07/10/19 07/31/19 0 2   AST increased  N N 1 Y 3 Y 3 07/10/19 07/31/19 0 2   Alkaline phos increased N N 2 Y 3 Y 3 07/10/19 07/31/19 0 2   Diarrhea  Y N 2 Y 3 Y 3 07/09/19 07/20/2019 1- imodium 1   Injection site pain Y N 1 N 1 Y 3 07/08/19 12/25/19 0 2   bloating Y N 1 N 1 N 1 07/08/19 ongoing 0 2   Bilateral foot neuropathy N N 1 N/A  N/A N/A N/A Baseline 02/2019 ongoing 0 2   Dyspnea on exertion Y N 1 N/A N/A N/A N/A Baseline 02/2019 ongoing 0 2   Diarrhea  N N 1 Y 1 Y 1 09/25/19  09/25/19 1- imodium 1   Anorexia  N N 1 Y 2 Y 2 10/02/19  11/13/19 0 2   Diarrhea  N N 1 Y 1 Y 1 10/16/19  10/17/19 0 1   diarrhea  N N 1 Y 1 Y 1 11/14/19  11/15/19 0 1   diarrhea  N N 1 Y 1 Y 1 01/10/20  01/12/20 0 1   diarrhea  N N 1 Y 1 Y 1 03/15/20  03/15/20 0 1                                                         Labs:Results for Jason Rowland (MRN [id number]) as of 04/27/2020 16:55 Ref. Range 04/27/2020 13:29 Sodium Latest Ref Range: 136 - 144 mmol/L 132 (L) Potassium Latest Ref Range: 3.3 - 5.3 mmol/L 4.6 Chloride Latest Ref Range: 98 - 107 mmol/L 92 (L) CO2 Latest Ref Range: 20 - 30 mmol/L 27 Anion Gap Latest Ref Range: 7 - 17  13 BUN Latest Ref Range: 8 - 23 mg/dL 20 Creatinine Latest Ref Range: 0.40 - 1.30 mg/dL 4.09 BUN/Creatinine Ratio Latest Ref Range: 8.0 - 23.0  24.1 (H) eGFR (NON African-American) Latest Ref Range: >60 mL/min/1.28m2 >60 eGFR (Afr Amer) Latest Ref Range: >60 mL/min/1.75m2 >60 Glucose Latest Ref Range: 70 - 100 mg/dL 811 (H) Calcium Latest Ref Range: 8.8 - 10.2 mg/dL 9.5 Magnesium Latest Ref Range: 1.7 - 2.4 mg/dL 1.9 Phosphorus Latest Ref Range: 2.2 - 4.5 mg/dL 3.0 Total Bilirubin Latest Ref Range: <=1.2 mg/dL 0.4 Alkaline Phosphatase Latest Ref Range: 9 - 122 U/L 79 Alanine Aminotransferase (ALT) Latest Ref Range: 9 - 59 U/L 31 Aspartate Aminotransferase (AST) Latest Ref Range: 10 - 35 U/L 28 AST/ALT Ratio Latest Ref Range: See Comment  0.9 LD Latest Ref Range: 122 - 241 U/L 218 Total Protein Latest Ref Range: 6.6 - 8.7 g/dL 8.1 Albumin Latest Ref Range: 3.6 - 4.9 g/dL 4.2 Globulin Latest Ref Range: 2.3 - 3.5 g/dL 3.9 (H) A/G Ratio Latest Ref Range: 1.0 - 2.2  1.1 T3, Total Latest Ref Range: See Comment ng/dL 91.4 Thyroid Stimulating Hormone, 3rd Gen. Latest Ref Range: See Comment ?IU/mL 4.430 (H) Free T4 Latest Ref Range: See Comment ng/dL 7.82 WBC Latest Ref Range: 4.0 - 11.0 x1000/?L 8.2 RBC Latest Ref Range: 4.00 - 6.00 M/?L 5.35 Hemoglobin Latest Ref Range: 13.2 - 17.1 g/dL 95.6 Hematocrit Latest Ref Range: 38.50 - 50.00 % 46.60 MCV Latest Ref Range: 80.0 - 100.0 fL 87.1 MCH Latest Ref Range: 27.0 - 33.0 pg 29.2 MCHC Latest Ref Range: 31.0 - 36.0 g/dL 21.3 RDW-CV Latest Ref Range: 11.0 - 15.0 % 13.2 Platelets Latest Ref Range: 150 - 420 x1000/?L 281 MPV Latest Ref Range: 8.0 - 12.0 fL 10.8 Neutrophils Latest Ref Range: 39.0 - 72.0 % 69.5 Lymphocytes Latest Ref Range: 17.0 - 50.0 % 20.3 Monocytes Latest Ref Range: 4.0 - 12.0 % 6.2 Eosinophils Latest Ref Range: 0.0 - 5.0 % 2.9 Basophils Latest Ref Range: 0.0 - 1.4 % 0.6 Immature Granulocytes Latest Ref Range: 0.0 - 1.0 % 0.5 nRBC Latest Ref  Range: 0.0 - 1.0 % 0.0 ANC (Abs Neutrophil Count) Latest Ref Range: 2.00 - 7.60 x 1000/?L 5.73 Absolute Lymphocyte Count Latest Ref Range: 0.60 - 3.70 x 1000/?L 1.67 Monocytes (Absolute) Latest Ref Range: 0.00 - 1.00 x 1000/?L 0.51 Eosinophil Absolute Count Latest Ref Range: 0.00 - 1.00 x 1000/?L 0.24 Basophils Absolute Latest Ref Range: 0.00 - 1.00 x 1000/?L 0.05 Immature Granulocytes (Abs) Latest Ref Range: 0.00 - 0.30 x 1000/?L 0.04 nRBC Absolute Latest Ref Range: 0.00 - 1.00 x 1000/?L 0.00 Prothrombin Time Latest Ref Range: 9.8 - 11.4 seconds 10.5 INR Latest Ref Range: 0.92 - 1.08  0.99 PTT Latest Ref Range: 22.7 - 28.9 seconds 23.9

## 2020-05-11 ENCOUNTER — Encounter: Admit: 2020-05-11 | Payer: PRIVATE HEALTH INSURANCE

## 2020-05-23 ENCOUNTER — Encounter: Admit: 2020-05-23 | Payer: PRIVATE HEALTH INSURANCE | Attending: Medical Oncology

## 2020-05-23 DIAGNOSIS — C4359 Malignant melanoma of other part of trunk: Secondary | ICD-10-CM

## 2020-05-24 ENCOUNTER — Encounter: Admit: 2020-05-24 | Payer: PRIVATE HEALTH INSURANCE

## 2020-05-25 ENCOUNTER — Encounter: Admit: 2020-05-25 | Payer: PRIVATE HEALTH INSURANCE

## 2020-05-25 ENCOUNTER — Telehealth: Admit: 2020-05-25 | Payer: PRIVATE HEALTH INSURANCE

## 2020-05-25 ENCOUNTER — Inpatient Hospital Stay: Admit: 2020-05-25 | Discharge: 2020-05-25 | Payer: BLUE CROSS/BLUE SHIELD

## 2020-05-25 ENCOUNTER — Ambulatory Visit: Admit: 2020-05-25 | Payer: BLUE CROSS/BLUE SHIELD

## 2020-05-25 DIAGNOSIS — Z006 Encounter for examination for normal comparison and control in clinical research program: Secondary | ICD-10-CM

## 2020-05-25 DIAGNOSIS — E7800 Pure hypercholesterolemia, unspecified: Secondary | ICD-10-CM

## 2020-05-25 DIAGNOSIS — C4359 Malignant melanoma of other part of trunk: Secondary | ICD-10-CM

## 2020-05-25 DIAGNOSIS — E119 Type 2 diabetes mellitus without complications: Secondary | ICD-10-CM

## 2020-05-25 DIAGNOSIS — Z79899 Other long term (current) drug therapy: Secondary | ICD-10-CM

## 2020-05-25 LAB — CBC WITH AUTO DIFFERENTIAL
BKR WAM ABSOLUTE LYMPHOCYTE COUNT.: 1.65 x 1000/??L (ref 0.60–3.70)
BKR WAM ABSOLUTE NRBC (2 DEC): 0 x 1000/??L (ref 0.00–1.00)
BKR WAM ANALYZER ANC: 6.15 x 1000/??L (ref 2.00–7.60)
BKR WAM BASOPHIL ABSOLUTE COUNT.: 0.07 x 1000/??L (ref 0.00–1.00)
BKR WAM BASOPHILS: 0.8 % (ref 0.0–1.4)
BKR WAM EOSINOPHILS: 3 % (ref 0.0–5.0)
BKR WAM HEMATOCRIT (2 DEC): 43.6 % (ref 38.50–50.00)
BKR WAM HEMOGLOBIN: 14.5 g/dL (ref 13.2–17.1)
BKR WAM IMMATURE GRANULOCYTES: 0.2 % (ref 0.0–1.0)
BKR WAM LYMPHOCYTES: 18.9 % — ABNORMAL HIGH (ref 17.0–50.0)
BKR WAM MCH (PG): 29.7 pg (ref 27.0–33.0)
BKR WAM MCHC: 33.3 g/dL (ref 31.0–36.0)
BKR WAM MCV: 89.2 fL (ref 80.0–100.0)
BKR WAM MONOCYTE ABSOLUTE COUNT.: 0.59 x 1000/??L (ref 0.00–1.00)
BKR WAM MONOCYTES: 6.8 % (ref 4.0–12.0)
BKR WAM MPV: 10.5 fL (ref 8.0–12.0)
BKR WAM NEUTROPHILS: 70.3 % (ref 39.0–72.0)
BKR WAM NUCLEATED RED BLOOD CELLS: 0 % (ref 0.0–1.0)
BKR WAM PLATELETS: 259 x1000/ÂµL (ref 150–420)
BKR WAM RDW-CV: 13.4 % (ref 11.0–15.0)
BKR WAM RED BLOOD CELL COUNT.: 4.89 M/??L (ref 4.00–6.00)

## 2020-05-25 LAB — URINE MICROSCOPIC     (BH GH LMW YH)

## 2020-05-25 LAB — COMPREHENSIVE METABOLIC PANEL
BKR A/G RATIO: 1.2 x 1000/??L (ref 1.0–2.2)
BKR ALANINE AMINOTRANSFERASE (ALT): 20 U/L (ref 9–59)
BKR ALBUMIN: 4.1 g/dL (ref 3.6–4.9)
BKR ALKALINE PHOSPHATASE: 69 U/L (ref 9–122)
BKR ANION GAP: 11 /HPF (ref 7–17)
BKR ASPARTATE AMINOTRANSFERASE (AST): 19 U/L (ref 10–35)
BKR AST/ALT RATIO: 1
BKR BILIRUBIN TOTAL: 0.3 mg/dL (ref <=1.2)
BKR BLOOD UREA NITROGEN: 27 mg/dL — ABNORMAL HIGH (ref 8–23)
BKR BUN / CREAT RATIO: 30 — ABNORMAL HIGH (ref 8.0–23.0)
BKR CALCIUM: 9.1 mg/dL (ref 8.8–10.2)
BKR CHLORIDE: 100 mmol/L (ref 98–107)
BKR CO2: 26 mmol/L (ref 20–30)
BKR CREATININE: 0.9 mg/dL (ref 0.40–1.30)
BKR EGFR (AFR AMER): >60 mL/min/{1.73_m2} (ref >60)
BKR EGFR (NON AFRICAN AMERICAN): >60 mL/min/{1.73_m2} (ref >60)
BKR GLOBULIN: 3.3 g/dL (ref 2.3–3.5)
BKR GLUCOSE: 288 mg/dL — ABNORMAL HIGH (ref 70–100)
BKR POTASSIUM: 4.9 mmol/L (ref 3.3–5.3)
BKR PROTEIN TOTAL: 7.4 g/dL (ref 6.6–8.7)
BKR SODIUM: 137 mmol/L (ref 136–144)
BKR WAM ABSOLUTE IMMATURE GRANULOCYTES.: >60 mL/min/1.73m2 (ref >60)
BKR WAM EOSINOPHIL ABSOLUTE COUNT.: 1 x 1000/??L (ref 0.00–1.00)

## 2020-05-25 LAB — URINALYSIS-MACROSCOPIC W/REFLEX MICROSCOPIC
BKR BILIRUBIN, UA: NEGATIVE
BKR KETONES, UA: NEGATIVE
BKR LEUKOCYTE ESTERASE, UA: NEGATIVE
BKR NITRITE, UA: NEGATIVE
BKR PH, UA: 6 (ref 5.5–7.5)
BKR SPECIFIC GRAVITY, UA: 1.022 — ABNORMAL HIGH (ref 1.005–1.020)
BKR UROBILINOGEN, UA: 0.2 EU/dL (ref <=2.0)

## 2020-05-25 LAB — T3: BKR T3 TOTAL: 105 ng/dL

## 2020-05-25 LAB — T4, FREE: BKR FREE T4: 0.89 ng/dL

## 2020-05-25 LAB — PHOSPHORUS     (BH GH L LMW YH): BKR PHOSPHORUS: 3.3 mg/dL (ref 2.2–4.5)

## 2020-05-25 LAB — PT/INR AND PTT (BH GH L LMW YH)
BKR INR: 0.96 (ref 0.92–1.08)
BKR PARTIAL THROMBOPLASTIN TIME: 24.9 s (ref 22.7–28.9)
BKR PROTHROMBIN TIME: 10.2 seconds — ABNORMAL HIGH (ref 9.8–11.4)

## 2020-05-25 LAB — LACTATE DEHYDROGENASE: BKR LACTATE DEHYDROGENASE: 184 U/L (ref 122–241)

## 2020-05-25 LAB — TSH: BKR THYROID STIMULATING HORMONE: 3.81 ??IU/mL

## 2020-05-25 MED ORDER — TRIAMCINOLONE ACETONIDE 0.1 % TOPICAL OINTMENT
0.1 % | Freq: Two times a day (BID) | TOPICAL | 2 refills | Status: AC | PRN
Start: 2020-05-25 — End: ?

## 2020-05-25 NOTE — Telephone Encounter
LVM- Dr Laveda Norman sent in a prescription for triamcinolone to his pharmacist for his arms and belly

## 2020-05-25 NOTE — Progress Notes
NAME:  Jason Rowland ([birth year])AGE: 75 y.o. MRN:  [id number] PROVIDER: Drema Halon, MD, PhDDATE OF SERVICE: 05/25/2020 [facility] [place]  REASON FOR VISIT:  HIC [id number] follow-up visitONC DX:  Invasive melanoma right mid backSTAGE:  IIIC (pT4a N3 M0); 30 mm with microsatellitosis, mitotic rate 2 per mm2.MOLECULAR PROFILING:0% tumor and immune cell PD-L1 staining50-gene panel:  DNA VARIANT DETECTED ? ? 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 ? 74% PRIOR TX:  WLE and SLNB 11/16/20Adjuvant Moderna clinical trial (?A Phase 2 Randomized Study of Adjuvant Immunotherapy with the Personalized Cancer Vaccine [id number] and Pembrolizumab Versus Pembrolizumab Alone After Complete Resection of High-Risk Melanoma? Protocol [id number], HIC 56213) C1D1 05/08/19. Last dose 04/27/20. Randomized to the pembrolizumab and vaccine arm.ACTIVE TX:   Active surveillance + clinical trial follow-up Onc Hx: Jason Rowland (prefers to be called Jason Rowland) is a 75 y.o. male who worked for [employer] (retired at the end of 02/2019 after working there for 50 years) with a PMH of DM2 complicated by peripheral neuropathy, HLD, and AKs who is referred by Joanie Coddington, his dermatologist for a right back metastatic melanoma.  He reports it appeared approx 10 months prior; his PCP originally thought it was a pimple.  Dr. Lorn Junes at [facility] later evaluated it and also thought it was a pimple and injected it with intralesional kenalog x 2 and oral cephalexin without improvement.  The lesion increased in size and burned whenever he leaned against anything.  Denies night sweats and weight loss.  Lucie Leather did a I&D on 01/07/19  with only slight bloody discharge, so a punch biopsy was obtain with path showing metastatic melanoma.  On 01/20/19, additional biopsies were done to try to evaluate a primary site, but we do not have this tissue.  Per patient report, these biopsies were negative. MRI brain 01/30/19 without intracranial disease.  PET done 02/07/19 showing preepiglottic FDG activity s/p ENT evaluation which was negative for any visible malignancy.  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 LDH elevated to 251 at diagnosis. His case was presented at the [facility] Melanoma tumor board on 02/13/19 and consensus was to ask ENT for random biopsies of the BOT and pre-epiglottic PET-avid areas, as we do not want to miss a head and neck cancer but otherwise not felt to be related to his melanoma.  The cervical LN were not thought to be related.  He should also have gastroenterology evaluation for a PET-avid stranding lesion in the transverse colon.  Dr. Landis Gandy (ENT) performed BOT and vallecula random biopsies 02/20/19 which only showed chronic inflammation and no malignancy.  He had a colonoscopy by Dr. Linton Flemings on 02/27/19 showing only a tubular adenoma in the sigmoid colon.  Overall, it was felt that his right mid back lesion is the site of his primary disease, and the metastatic melanoma pathology interpretation may have been complicated due to repeat I&Ds and steroid injections that may have altered the architecture of the primary lesion.  In the absence of any confirmed metastatic disease, he underwent WLE and LND with Dr. Duanne Moron on 03/03/19.  Final path showed a 30 mm thick melanoma, 2 mitoses/mm2, non-brisk TILs, no tumor regression, LVI+, microsatellites+, negative margins, and 3/35 lymph nodes (largest LN 4.5 x 3 cm) were positive for melanoma with presence of lymphatic invasion without extranodal extension.He uses a cane sometimes as he has some balance issues due to neuropathy in his feet.  Otherwise, he is fully functional, active, and independent in  ADLs/iADLs.  No hx of autoimmune disease.03/27/19 signed consent for the Moderna trial; randomized to pembro and vaccine arm.  Started treatment C1D1 05/08/19.  Completed 1 year of adjuvant treatment; last dose 04/27/20.04/24/19 noted to have wound dehiscence, non-infected, healing by secondary intent.  Re-instated VNA services to assist with dressing changes and packing.  Wound completely healed by 06/20/19.10/27/19, 01/12/20, and 04/24/20 surveillance scans NED.Interval ZO:XWRU returns for follow-up today per trial protocol.  He received his last dose of adjuvant treatment 04/27/20.  Developed a follicular rash on the b/l arms without pruritis 1 week ago.  Hasn't tried anything for it.  Right worse then left mainly distal to the elbow.Returns to [facility] on Thursday.Since his last treatment 3 weeks ago, he notes the following?issues/updates:?GI:-Diarrhea: 1 episode yesterday after eating some Portuguese soup.  Not related to study Rx.-taste changes/loss of smell and appetite (06/22/19) resolved 08/21/19 back to baseline.  Has some recurrence of sx since (09/25/19) -- still improved from March; remains resolved to baseline.-ongoing occasional gas pains Gr1 with sensation of bloating, intermittent relieved with flatus; stable?Resp:-DOE with walking long distances about a quarter of a mile (pre-dated tx on trial); better given more recent exercise?Extremities:- lower extremity swelling stable and minimal?Metabolic-?long-acting?insulin dose decr to 80 units qhs.  - fatigue?(Gr1)?since 3/11 which waxes and wanes and sl better today; improved with exercise. ?Insomnia is improved; still taking his chronic Remus Loffler Denies any fever. ?ECOG:  1Pain: 0Review of Systems:No fevers, chills, night sweats, lightheadedness, HA, CP, palpitations, wheezing, abd pain, constipation, blood in the urine or stools, dysuria.  Diabetic neuropathy in the hands and feet stable.  He has a prior hx of intermittent loose stools, chronic, without blood depending on his diet (often isolated to a specific restaurant he likes to visit with his buddies).  Positive responses on ROS as noted in interval hx. PAST MEDICAL HISTORY:Past Medical History: Diagnosis Date ? Diabetes mellitus (HC Code) (HC CODE)  ? High cholesterol  ? Malignant melanoma of torso excluding breast (HC Code) (HC CODE)  SURGICAL HISTORY:Past Surgical History: Procedure Laterality Date ? COLONOSCOPY   ? CORONARY ANGIOPLASTY WITH STENT PLACEMENT Bilateral  ? laryngoscopy with biopsy  02/20/2019 ? ROTATOR CUFF REPAIR Left 1999 ? TONSILLECTOMY    age 20 ? TOTAL HIP ARTHROPLASTY Right 2002 FAMILY HISTORY:Family History Problem Relation Age of Onset ? Heart disease Mother  ? Bladder cancer Father 8 ? Kidney cancer Sister  ? Leukemia Brother 27 ? No Known Problems Daughter  ? No Known Problems Son  SOCIAL HISTORY:Social History Socioeconomic History ? Marital status: Divorced   Spouse name: Not on file ? Number of children: Not on file ? Years of education: Not on file ? Highest education level: Not on file Occupational History ? Not on file Tobacco Use ? Smoking status: Never Smoker ? Smokeless tobacco: Never Used Vaping Use ? Vaping Use: Never used Substance and Sexual Activity ? Alcohol use: Not Currently ? Drug use: No ? Sexual activity: Not on file Other Topics Concern ? Not on file Social History Narrative  Divorced, works for [employer] in [place] will be retiring 03/17/19.  Has 1 son in [place] and 1 daughter in [place]. Llives in [place]  Social Determinants of Health Financial Resource Strain:  ? Difficulty of Paying Living Expenses:  Food Insecurity:  ? Worried About Programme researcher, broadcasting/film/video in the Last Year:  ? Barista in the Last Year:  Transportation Needs:  ? Freight forwarder (Medical):  ? Lack of Transportation (  Non-Medical):  Physical Activity:  ? Days of Exercise per Week:  ? Minutes of Exercise per Session:  Stress:  ? Feeling of Stress :  Social Connections:  ? Frequency of Communication with Friends and Family:  ? Frequency of Social Gatherings with Friends and Family:  ? Attends Religious Services:  ? Active Member of Clubs or Organizations:  ? Attends Banker Meetings:  ? Marital Status:  Intimate Partner Violence:  ? Fear of Current or Ex-Partner:  ? Emotionally Abused:  ? Physically Abused:  ? Sexually Abused:  ALLERGIES:No Known AllergiesMEDICATIONS:Current Outpatient Medications Medication Sig ? aspirin 81 MG EC tablet Take 81 mg by mouth nightly.  ? FREESTYLE LIBRE 14 DAY sensor kit 1 each by Other route every 14 (fourteen) days. ? glipiZIDE (GLUCOTROL XL) 10 MG 24 hr tablet Take 10 mg by mouth nightly.  ? simvastatin (ZOCOR) 20 MG tablet Take 20 mg by mouth nightly.  ? TOUJEO SOLOSTAR U-300 300 unit/mL (1.5 mL) pen Inject 30 Units under the skin nightly. ? vit A/vit C/vit E/zinc/copper (PRESERVISION AREDS ORAL) Take by mouth 2 (two) times daily. ? zolpidem (AMBIEN) 10 mg tablet nightly.. ? acetaminophen (TYLENOL) 325 mg tablet Take 3 tablets (975 mg total) by mouth every 6 (six) hours as needed (mild to moderate pain). (Patient not taking: Reported on 04/27/2020) ? ALPRAZolam (XANAX) 0.5 mg tablet 0.5 mg.  (Patient not taking: Reported on 04/06/2020) ? docusate sodium (COLACE) 250 mg capsule Take 1 capsule (250 mg total) by mouth 2 (two) times daily as needed for constipation. (Patient not taking: Reported on 05/25/2020) ? ibuprofen (ADVIL,MOTRIN) 200 mg tablet Take 2 tablets (400 mg total) by mouth every 6 (six) hours as needed (alternate with tylenol for mild to moderate pain). (Patient not taking: Reported on 04/27/2020) ? olopatadine (PATANOL) 0.1 % ophthalmic solution as needed (takes for eye irritation as needed).  (Patient not taking: Reported on 04/06/2020) ? triamcinolone (KENALOG) 0.1 % ointment Apply topically 2 (two) times daily as needed. To areas of rash. No current facility-administered medications for this visit. VITALS: BP (!) 161/89  - Pulse 86  - Temp 97.5 ?F (36.4 ?C) (Temporal)  - Wt 124.4 kg  - SpO2 97%  - BMI 39.18 kg/m?  PHYSICAL EXAM:Gen:  NAD, pleasant HEENT:  EOMI, PEARLLN:  No cervical, supraclavicular, axillary, or inguinal LAD. CV:  No murmurs, tachy, no m/r/g Pulm:  CTA b/lAbd:  Soft, no tenderness, no guarding or rebound, no organomegaly, no ascitesExt:  WWP, 1+ symmetric edema in the ankles.  Lymphedema present in the right arm, unchanged.Neuro:   Grossly intactPsych:  Normal mood and affectSkin:  Scar on the right upper back well healed without evidence of recurrence. Small lateral skin puckering at the edges of the scar stable.  Flaking skin/dryness in the middle portion of the scar.  Right axillary scar well healed.LABS:Results for orders placed or performed during the hospital encounter of 05/25/20 LACTATE DEHYDROGENASE Result Value Ref Range  LD 184 122 - 241 U/L Urinalysis-macroscopic w/reflex microscopic Result Value Ref Range  Clarity, UA Clear Clear  Color, UA Yellow Yellow  Specific Gravity, UA 1.022 (H) 1.005 - 1.020  pH, UA 6.0 5.5 - 7.5  Protein, UA 2+ (A) Negative-Trace  Glucose, UA 3+ (A) Negative  Blood, UA Small (A) Negative  Bilirubin, UA Negative Negative  Ketones, UA Negative Negative  Leukocytes, UA Negative Negative  Nitrite, UA Negative Negative  Urobilinogen, UA 0.2 <=2.0 EU/dL PT/INR and PTT (BH GH L LMW YH) Result  Value Ref Range  Prothrombin Time 10.2 9.8 - 11.4 seconds  INR 0.96 0.92 - 1.08  PTT 24.9 22.7 - 28.9 seconds Phosphorus     (BH GH L YH) Result Value Ref Range  Phosphorus 3.3 2.2 - 4.5 mg/dL CBC auto differential Result Value Ref Range  WBC 8.7 4.0 - 11.0 x1000/?L  RBC 4.89 4.00 - 6.00 M/?L  Hemoglobin 14.5 13.2 - 17.1 g/dL  Hematocrit 16.10 96.04 - 50.00 %  MCV 89.2 80.0 - 100.0 fL  MCH 29.7 27.0 - 33.0 pg  MCHC 33.3 31.0 - 36.0 g/dL RDW-CV 54.0 98.1 - 19.1 %  Platelets 259 150 - 420 x1000/?L  MPV 10.5 8.0 - 12.0 fL  Neutrophils 70.3 39.0 - 72.0 %  Lymphocytes 18.9 17.0 - 50.0 %  Monocytes 6.8 4.0 - 12.0 %  Eosinophils 3.0 0.0 - 5.0 %  Basophil 0.8 0.0 - 1.4 %  Immature Granulocytes 0.2 0.0 - 1.0 %  nRBC 0.0 0.0 - 1.0 %  ANC(Abs Neutrophil Count) 6.15 2.00 - 7.60 x 1000/?L  Absolute Lymphocyte Count 1.65 0.60 - 3.70 x 1000/?L  Monocyte Absolute Count 0.59 0.00 - 1.00 x 1000/?L  Eosinophil Absolute Count 0.26 0.00 - 1.00 x 1000/?L  Basophil Absolute Count 0.07 0.00 - 1.00 x 1000/?L  Absolute Immature Granulocyte Count 0.02 0.00 - 0.30 x 1000/?L  Absolute nRBC 0.00 0.00 - 1.00 x 1000/?L Comprehensive metabolic panel Result Value Ref Range  Sodium 137 136 - 144 mmol/L  Potassium 4.9 3.3 - 5.3 mmol/L  Chloride 100 98 - 107 mmol/L  CO2 26 20 - 30 mmol/L  Anion Gap 11 7 - 17  Glucose 288 (H) 70 - 100 mg/dL  BUN 27 (H) 8 - 23 mg/dL  Creatinine 4.78 2.95 - 1.30 mg/dL  Calcium 9.1 8.8 - 62.1 mg/dL  BUN/Creatinine Ratio 30.8 (H) 8.0 - 23.0  Total Protein 7.4 6.6 - 8.7 g/dL  Albumin 4.1 3.6 - 4.9 g/dL  Total Bilirubin 0.3 <=6.5 mg/dL  Alkaline Phosphatase 69 9 - 122 U/L  Alanine Aminotransferase (ALT) 20 9 - 59 U/L  Aspartate Aminotransferase (AST) 19 10 - 35 U/L  Globulin 3.3 2.3 - 3.5 g/dL  A/G Ratio 1.2 1.0 - 2.2  AST/ALT Ratio 1.0 See Comment  eGFR (Afr Amer) >60 >60 mL/min/1.59m2  eGFR (NON African-American) >60 >60 mL/min/1.73m2 IMAGING/PATH:No results found. WLE and SLN 03/03/19:LYMPH NODES, RIGHT AXILLARY LEVELS 1, 2 AND 3, LYMPH NODE DISSECTION: ?  ? ? - ?THREE OF THIRTY-FIVE LYMPH NODES, POSITIVE FOR MELANOMA (3 OF 35) WITH LARGEST INVOLVED LYMPH NODE MEASURING 4.5 X 3 CM ? ? ?- LYMPHATIC INVASION IDENTIFIED ? ? ?- NO EXTRANODAL EXTENSION IDENTIFIED 1. ?SKIN, BACK, RIGHT UPPER, EXCISION: ? ? ? ?- MALIGNANT MELANOMA, SEE NOTE AND SYNOPTIC SUMMARY Note: Sections show a tumor composed of S100 positive epithelioid cells within the dermis and subcutaneous tissue. Cytokeratin AE1/AE3 and desmin are negative. A junctional component is not identified. The findings would be compatible with a primary dermal melanoma or a metastatic lesion. Clinical correlation is suggested. 2. ?SKIN, BACK, EXCISION: ? ? ? ?- BENIGN FIBROADIPOSE TISSUE AND SKELETAL MUSCLE SYNOPTIC SUMMARY MELANOMA OF THE SKIN Tumor Site: ? ? Back Laterality: ? ? Right Procedure: ? ? Primary excision Maximum Tumor Thickness (Depth): ? ? 30 mm Ulceration: ? ? Not identified Mitotic Rate (Mitoses/mm2): ? ? 2 Anatomic Level: ? ? V (Melanoma invades subcutis) Growth Phase: ? ? Vertical Histologic Type: ? ? Melanoma, NOS Tumor-Infiltrating Lymphocytes: ? ? Present, non-brisk  Tumor Regression: ? ? Not identifed Lymphovascular Invasion: ? ? Present Microsatellitosis: ? ? Present Margins ? ? ?Peripheral Margins: ? ? Uninvolved Deep Margin: ? ? Uninvolved Stage (AJCC 8th Ed): ? ? pT4a Nx Base of the tongue and vallecula random biopsies 02/20/19: 1. ?TONGUE, LEFT, BASE, BIOPSY: ? ? ? ?- LYMPHOID TISSUE HYPERPLASIA ? ? ?- NEGATIVE FOR MALIGNANCY 2. ?THROAT, VALLECULA, BIOPSY: ? ?  ? - LYMPHOID TISSUE HYPERPLASIA AND FOCAL CHRONIC INFLAMMATION. SEE NOTE. ? ? ?- NEGATIVE FOR MALIGNANCY ? ? ? ? ? Note: Immunostain of Sox-10 to investigate focal pigment is negative, supporting above interpretation. 3. ?THROAT, LEFT VALLECULA, BIOPSY: ? ? ? ?- LYMPHOID TISSUE HYPERPLASIA ? ? ?- NEGATIVE FOR MALIGNANCY 4. ?THROAT, RIGHT VALLECULA, BIOPSY: ? ? ? ? ? ? - LYMPHOID TISSUE HYPERPLASIA AND FOCAL CHRONIC INFLAMMATION ? ? ?- NEGATIVE FOR MALIGNANCY 5. ?TONGUE, RIGHT BASE, BIOPSY: ? ? ? ? ? ? - LYMPHOID TISSUE HYPERPLASIA AND FOCAL CHRONIC INFLAMMATION ? ? ?- NEGATIVE FOR MALIGNANCY Pathology (reviewed at [facility]), collected 01/07/19:DIAGNOSIS: ? 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 Clinical pathological correlation is recommended. MICROSCOPIC DESCRIPTION: There are atypical cells in a nodule in the dermis. The cells are positive with SOX-10 and by report are negative with LCA, Kappa, Lambda, and a cytokeratin. The cells are also positive with S-100 by report.ASSESSMENT/PLAN:Jason Rowland is a 75 y.o. 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  Initially path read as metastatic melanoma, but further workup confirms that this is the primary site, not a metastatic lesion.  Initial biopsy was likely altered due to repeat I&Ds of the area.  He had a FBSE with his dermatologist and no additional primary lesions were identified.  He is otherwise in good health except for metabolic syndrome and diabetes-associated neuropathy.  PET 02/07/19 showed the main lesion on the right upper back extending to the latissimus dorsi muscle with involved right axillary nodes and nonspecific avidity in the BOT and preepiglottis for which direct visual evaluation was recommended and done 02/11/19 and random biopsies 02/20/19 without concerns for malignancy. Colonoscopy was also done to evaluate stranding in the transverse colon, which was negative for malignancy on exam.  Now s/p WLE and LND with Dr. Duanne Moron on 03/03/19 showing a stage IIIC melanoma that was 30 mm thick, positive for LVI and microsatellites with 3/25 positive LNs.  Given the thickness of his primary melanoma and microsatellitosis, we felt that his melanoma-specific survival is closer to a stage IIID:  60% over 10 years with stage IIIC disease versus 24% with stage IIID.  He does not have a BRAF mutation, so SOC options are immune therapy at this point versus the Moderna clinical trial (?A Phase 2 Randomized Study of Adjuvant Immunotherapy with the Personalized Cancer Vaccine [id number] and Pembrolizumab Versus Pembrolizumab Alone After Complete Resection of High-Risk Melanoma?) vs active surveillance (for which he would be extremely high risk for relapsing).  He opted for the Moderna trial and signed consent on 03/27/19; tissue passed QC inspection.  Jason Rowland was randomized to the [facility] with vaccine arm of the trial.  He has experienced some weight loss due to loss of taste/smell/appetite after initiation the clinical trial, which is now resolved for the past several months with weight gain. He has stable right arm lymphedema but declines any lymphedema therapy  or sleeves.  He also had some G2 diarrhea after C4, which resolved with Imodium and not requiring anti-diarrheals by C7.  Energy is improved after starting an exercise regimen.  Completed his last dose on the trial 04/27/20.  Today with Gr1 rash to his b/l forearms which may be related to anti-PD-1.- labs reviewed today.  LDH wnl.  UA with 3+ glucose and 2+ protein not related to study drug.- bilateral distal arm rash:  Triamcinolone 0.1% ointment BID prn.- [place] CAP 04/25/19 NED.  Next set of scans approx 07/05/20.- routine f/u with dermatologist every 4-6 months. - RTC per trial schedule; instructed to call if any issues or questions in the interim20 min were spent in direct patient contact.  He has our clinic contact information and instructed to call if questions.  Drema Halon, MD, PhDMedical Oncology

## 2020-05-27 NOTE — Progress Notes
HIC #?[id number] Day safety follow up?Patient seen and examined by?Drema Halon, MDLabs and toxicity assessment reviewed?by?Drema Halon, MDAll labs are deemed NCS today?per Drema Halon, MDConcomitant medications reviewed and updated.Calendar was reviewed with patient and they will RTC per protocol??ECOG PS =?1??Concomitant medications:?MEDICATION SINGLE DOSE UNITS ROUTE FREQUECY START DATE STOP DATE INDICATION Aspirin 81 mg PO QD 2010 ongoing PPX CVD Simvastatin 20 Mg PO QD 2012 ongoing Hypercholesterolemia ?Ambien 10 mg PO QD 2016 ongoing insomnia ?Glucoside 10 mg PO QD 2014 ongoing diabetes ?Toujeo Solostar ?80 Units SQ QD ?2016 ?07/08/19 diabetes ?guaifenesin 400 mg PO q 4 hours prn 07/10/19 08/16/19 cough ?imodium 2 Mg PO PRN 07/19/19 ongoing diarrhea Toujeo Solostar 40-50 units subcutaneous QD 07/09/19 10/01/19 diabetes ?Toujeo Solostar 60 units SQ QD 10/02/19 ?01/14/20 diabetes Cefalexin 250 Mg Oral TID 12/24/19 01/02/20 PPX Infection ?Moderna COVID Booster 1? Injection IM Once 01/10/20 01/10/20 PPX COVID-19 ?Toujeo Solostar ?70 Units subcutaneous ?QD ?01/15/20 03/15/20 Diabetes Lispro 5 Units SQ Once 02/03/20 02/03/20 diabetes ?Toujeo Solostar ?80 Units subcutaneous ?QD ?03/16/20 04/26/20 Diabetes Areds Preservision 1 Tab PO BID 04/27/20 Ongoing Vision supplement ?Toujeo Solostar ?70 Units subcutaneous ?QD ?01/15/20 03/15/20 Diabetes Triamcinolone creme 0.1% 1 application topical PRN 05/25/20 ongoing rash ??Medical History:?? ? ? ? ? ? ? ? ? ? ? ? ? ? ? ? ? ? ? ? ? ? ? ? ? ? ? ? PATIENT NAME:?Jason Rowland??STUDY ID: ?[id number] ? ? 0=NA ?????????????????????1= Unrelated ????????2= Unlikely 3=Possible ????????4= Probable ??????????5. Definite (Circle One) 0= No Action Taken ????????????????????????????????????????1= Con Med ???????????????????????????????????????????????????????????2=Dose Modified ???????????????????????????????????????3=Dose Delayed ????????????????????????????????????????????????????????4= Patient Hospitalized ?????????????????????????????????????5= Patient Taken Off Study ???????????????????6=Non Drug Therapy ???????????????????????????????????????7=Other (Specify) 1=Recovered ??????????????????????2=Still under txmt/observation ?????????????????????????????3=Alive w/Sequelae ????????????????????????????????????????????????????????????????4=Died ????????????????????????5=Resolvd to Baseline ????????????6=Grade Change ? ? ? Medical History ? ? ? ? ? ? ? ? ? ? ? ADVERSE EVENT  Is AE Intermittent? Y/N SAE ????????????Y/N Grade Expect?PEMBRO?Y/N ?Relation to?PEMBRO Expect?mRNA????????????Y/N ?Relation to?mRNA Start Date End Date/ or ?Ongoing Action Taken ??????????????(Select # above) AE OUTCOME ? Hypercholesterolemia N N 1 Med Hx Med Hx Med Hx Med Hx 2012 ongoing 1=?simvastatin 2 ? diabetes N N 1 Med Hx Med Hx Med Hx Med Hx 2010 ongoing 1-?glucoside; toujeo 2 ? Insomnia N N 1 Med Hx Med Hx Med Hx Med Hx 2016 ongoing 1- ambien 2 ? Edema?limbs N N 1 Med Hx? Med Hx Med Hx Med Hx ?03/03/2019 ongoing 0 2 ? Diarrhea? N N 1 Y? 1 ?N/A ?N/A ?05/27/2019 05/27/2019 0 5 ? fatigue? ?N N? 1? ?Y 3? ?Y 3? ?06/26/19 ?ongoing ?0 2? ? dysgeusia N N 1 N 2 N 2 06/22/19 08/21/19 0 2 ? anorexia N N 1 Y 2 Y 2 06/22/19 08/21/19 0 2 ? Pain, injection site Y N 1 N 1 Y 4 06/19/19 06/23/19 0 2 ? Eye disorders, other (left eye droop) N N 1 N 2 N 2 07/01/19 07/31/19 0 2 ? vomiting N N 1 Y 3 Y 3 07/08/19 07/08/19 0 1 ? Jason Rowland 1 Y 3 Y 3 07/08/19 07/08/19 0 2 ? cough N N 1 Y 3 Y 3 07/08/19 08/16/19 1 = gueifenisen 2 ? ALT increased N N 1 Y 3 Y 3 07/10/19 07/31/19 0 2 ? AST increased  N N 1 Y 3 Y 3 07/10/19 07/31/19 0 2 ? Alkaline phos increased N N 2 Y 3 Y 3 07/10/19 07/31/19 0 2 ? Diarrhea? Y N 2 Y 3 Y 3 07/09/19 07/20/2019 1- imodium 1 ? Injection site pain Y N 1 N 1 Y  3 07/08/19 12/25/19 0 2 ? bloating Y N 1 N 1 N 1 07/08/19 ongoing 0 2 ? Bilateral foot neuropathy N N 1 N/A N/A N/A N/A Baseline 02/2019 ongoing 0 2 ? Dyspnea on exertion Y N 1 N/A N/A N/A N/A Baseline 02/2019 ongoing 0 2 ? Diarrhea? N N 1 Y 1 Y 1 09/25/19? 09/25/19 1- imodium 1 ? Anorexia? N N 1 Y 2 Y 2 10/02/19? 11/13/19 0 2 ? Diarrhea? N N 1 Y 1 Y 1 10/16/19? 10/17/19 0 1 ? diarrhea? N N 1 Y 1 Y 1 11/14/19? 11/15/19 0 1 ? diarrhea? N N 1 Y 1 Y 1 01/10/20? 01/12/20 0 1 ? diarrhea? N N 1 Y 1 Y 1 03/15/20? 03/15/20 0 1 ? Maculopapular rash bilateral arms? ?N N 1 Y 0 Y 0 05/18/20 ongoing I triamcinolone 1 ? ? ? ? ? ? ? ? ? ? ? ? ? ? ??Labs:Results for Jason Rowland (MRN [id number]) Ref. Range 05/25/2020 13:53 Sodium Latest Ref Range: 136 - 144 mmol/L 137 Potassium Latest Ref Range: 3.3 - 5.3 mmol/L 4.9 Chloride Latest Ref Range: 98 - 107 mmol/L 100 CO2 Latest Ref Range: 20 - 30 mmol/L 26 Anion Gap Latest Ref Range: 7 - 17  11 BUN Latest Ref Range: 8 - 23 mg/dL 27 (H) Creatinine Latest Ref Range: 0.40 - 1.30 mg/dL 4.09 BUN/Creatinine Ratio Latest Ref Range: 8.0 - 23.0  30.0 (H) eGFR (NON African-American) Latest Ref Range: >60 mL/min/1.45m2 >60 eGFR (Afr Amer) Latest Ref Range: >60 mL/min/1.16m2 >60 Glucose Latest Ref Range: 70 - 100 mg/dL 811 (H) Calcium Latest Ref Range: 8.8 - 10.2 mg/dL 9.1 Phosphorus Latest Ref Range: 2.2 - 4.5 mg/dL 3.3 Total Bilirubin Latest Ref Range: <=1.2 mg/dL 0.3 Alkaline Phosphatase Latest Ref Range: 9 - 122 U/L 69 Alanine Aminotransferase (ALT) Latest Ref Range: 9 - 59 U/L 20 Aspartate Aminotransferase (AST) Latest Ref Range: 10 - 35 U/L 19 AST/ALT Ratio Latest Ref Range: See Comment  1.0 LD Latest Ref Range: 122 - 241 U/L 184 Total Protein Latest Ref Range: 6.6 - 8.7 g/dL 7.4 Albumin Latest Ref Range: 3.6 - 4.9 g/dL 4.1 Globulin Latest Ref Range: 2.3 - 3.5 g/dL 3.3 A/G Ratio Latest Ref Range: 1.0 - 2.2  1.2 T3, Total Latest Ref Range: See Comment ng/dL 914.7 Thyroid Stimulating Hormone, 3rd Gen. Latest Ref Range: See Comment ?IU/mL 3.810 Free T4 Latest Ref Range: See Comment ng/dL 8.29 WBC Latest Ref Range: 4.0 - 11.0 x1000/?L 8.7 RBC Latest Ref Range: 4.00 - 6.00 M/?L 4.89 Hemoglobin Latest Ref Range: 13.2 - 17.1 g/dL 56.2 Hematocrit Latest Ref Range: 38.50 - 50.00 % 43.60 MCV Latest Ref Range: 80.0 - 100.0 fL 89.2 MCH Latest Ref Range: 27.0 - 33.0 pg 29.7 MCHC Latest Ref Range: 31.0 - 36.0 g/dL 13.0 RDW-CV Latest Ref Range: 11.0 - 15.0 % 13.4 Platelets Latest Ref Range: 150 - 420 x1000/?L 259 MPV Latest Ref Range: 8.0 - 12.0 fL 10.5 Neutrophils Latest Ref Range: 39.0 - 72.0 % 70.3 Lymphocytes Latest Ref Range: 17.0 - 50.0 % 18.9 Monocytes Latest Ref Range: 4.0 - 12.0 % 6.8 Eosinophils Latest Ref Range: 0.0 - 5.0 % 3.0 Basophils Latest Ref Range: 0.0 - 1.4 % 0.8 Immature Granulocytes Latest Ref Range: 0.0 - 1.0 % 0.2 nRBC Latest Ref Range: 0.0 - 1.0 % 0.0 ANC (Abs Neutrophil Count) Latest Ref Range: 2.00 - 7.60 x 1000/?L 6.15 Absolute Lymphocyte Count Latest Ref Range: 0.60 - 3.70 x 1000/?L 1.65 Monocytes (Absolute) Latest  Ref Range: 0.00 - 1.00 x 1000/?L 0.59 Eosinophil Absolute Count Latest Ref Range: 0.00 - 1.00 x 1000/?L 0.26 Basophils Absolute Latest Ref Range: 0.00 - 1.00 x 1000/?L 0.07 Immature Granulocytes (Abs) Latest Ref Range: 0.00 - 0.30 x 1000/?L 0.02 nRBC Absolute Latest Ref Range: 0.00 - 1.00 x 1000/?L 0.00 Prothrombin Time Latest Ref Range: 9.8 - 11.4 seconds 10.2 INR Latest Ref Range: 0.92 - 1.08  0.96 PTT Latest Ref Range: 22.7 - 28.9 seconds 24.9 Clarity, UA Latest Ref Range: Clear  Clear Specific Gravity, UA Latest Ref Range: 1.005 - 1.020  1.022 (H) pH, UA Latest Ref Range: 5.5 - 7.5  6.0 Protein, UA Latest Ref Range: Negative-Trace  2+ (A) Glucose, UA Latest Ref Range: Negative  3+ (A) Ketones, UA Latest Ref Range: Negative  Negative Blood, UA Latest Ref Range: Negative  Small (A) Bilirubin, UA Latest Ref Range: Negative  Negative Leukocytes, UA Latest Ref Range: Negative  Negative Nitrite, UA Latest Ref Range: Negative  Negative Hyaline Casts, UA Latest Ref Range: 0 - 3 /LPF 1-3 WBC/HPF Latest Ref Range: 0 - 5 /HPF 3-5 RBC/HPF Latest Ref Range: 0 - 2 /HPF 0-2 Bacteria, UA Latest Ref Range: None-Few /HPF None Color, UA Latest Ref Range: Yellow  Yellow Urobilinogen, UA Latest Ref Range: <=2.0 EU/dL 0.2 Epithelial Cells Latest Ref Range: None-Few /LPF None

## 2020-06-03 ENCOUNTER — Encounter: Admit: 2020-06-03 | Payer: PRIVATE HEALTH INSURANCE | Attending: Medical Oncology

## 2020-06-03 DIAGNOSIS — C4359 Malignant melanoma of other part of trunk: Secondary | ICD-10-CM

## 2020-06-30 ENCOUNTER — Telehealth: Admit: 2020-06-30 | Payer: PRIVATE HEALTH INSURANCE

## 2020-06-30 NOTE — Telephone Encounter
Jason Rowland called to find out when his next scan appointment is. I informed him he has a Naplate scan scheduled on 07/05/20 at 2:30pm.

## 2020-07-02 ENCOUNTER — Telehealth: Admit: 2020-07-02 | Payer: PRIVATE HEALTH INSURANCE

## 2020-07-02 NOTE — Telephone Encounter
Returned call to Jason Rowland.  Scans are Monday July 05, 2020 at 2:30 PM in [place].  He will be there.  [name] CRA

## 2020-07-05 ENCOUNTER — Inpatient Hospital Stay: Admit: 2020-07-05 | Discharge: 2020-07-05 | Payer: BLUE CROSS/BLUE SHIELD

## 2020-07-05 DIAGNOSIS — Z006 Encounter for examination for normal comparison and control in clinical research program: Secondary | ICD-10-CM

## 2020-07-05 DIAGNOSIS — C4359 Malignant melanoma of other part of trunk: Secondary | ICD-10-CM

## 2020-07-05 DIAGNOSIS — R222 Localized swelling, mass and lump, trunk: Secondary | ICD-10-CM

## 2020-07-05 MED ORDER — SODIUM CHLORIDE 0.9 % LARGE VOLUME SYRINGE FOR AUTOINJECTOR
Freq: Once | INTRAVENOUS | Status: CP | PRN
Start: 2020-07-05 — End: ?
  Administered 2020-07-05: 19:00:00 via INTRAVENOUS

## 2020-07-05 MED ORDER — IOHEXOL 350 MG IODINE/ML INTRAVENOUS SOLUTION
350 mg iodine/mL | Freq: Once | INTRAVENOUS | Status: CP | PRN
Start: 2020-07-05 — End: ?
  Administered 2020-07-05: 19:00:00 350 mL via INTRAVENOUS

## 2020-07-06 ENCOUNTER — Encounter: Admit: 2020-07-06 | Payer: PRIVATE HEALTH INSURANCE

## 2020-07-06 ENCOUNTER — Ambulatory Visit: Admit: 2020-07-06 | Payer: BLUE CROSS/BLUE SHIELD

## 2020-07-06 ENCOUNTER — Inpatient Hospital Stay: Admit: 2020-07-06 | Discharge: 2020-07-06 | Payer: BLUE CROSS/BLUE SHIELD

## 2020-07-06 ENCOUNTER — Ambulatory Visit: Admit: 2020-07-06 | Payer: BLUE CROSS/BLUE SHIELD | Attending: Surgical

## 2020-07-06 DIAGNOSIS — C4359 Malignant melanoma of other part of trunk: Secondary | ICD-10-CM

## 2020-07-06 DIAGNOSIS — Z006 Encounter for examination for normal comparison and control in clinical research program: Secondary | ICD-10-CM

## 2020-07-06 DIAGNOSIS — E7800 Pure hypercholesterolemia, unspecified: Secondary | ICD-10-CM

## 2020-07-06 DIAGNOSIS — E119 Type 2 diabetes mellitus without complications: Secondary | ICD-10-CM

## 2020-07-06 LAB — COMPREHENSIVE METABOLIC PANEL
BKR A/G RATIO: 1.1 x 1000/??L (ref 1.0–2.2)
BKR ALANINE AMINOTRANSFERASE (ALT): 24 U/L (ref 9–59)
BKR ALBUMIN: 4.1 g/dL (ref 3.6–4.9)
BKR ALKALINE PHOSPHATASE: 86 U/L (ref 9–122)
BKR ANION GAP: 11 g/dL (ref 7–17)
BKR ASPARTATE AMINOTRANSFERASE (AST): 22 U/L (ref 10–35)
BKR AST/ALT RATIO: 0.9 x 1000/??L (ref 0.00–1.00)
BKR BILIRUBIN TOTAL: 0.4 mg/dL (ref <=1.2)
BKR BLOOD UREA NITROGEN: 21 mg/dL (ref 8–23)
BKR BUN / CREAT RATIO: 22.8 % (ref 8.0–23.0)
BKR CALCIUM: 9.6 mg/dL (ref 8.8–10.2)
BKR CHLORIDE: 98 mmol/L (ref 98–107)
BKR CO2: 28 mmol/L (ref 20–30)
BKR CREATININE: 0.92 mg/dL (ref 0.40–1.30)
BKR EGFR (AFR AMER): >60 mL/min/{1.73_m2} (ref >60)
BKR EGFR (NON AFRICAN AMERICAN): >60 mL/min/{1.73_m2} (ref >60)
BKR GLOBULIN: 3.8 g/dL — ABNORMAL HIGH (ref 2.3–3.5)
BKR GLUCOSE: 243 mg/dL — ABNORMAL HIGH (ref 70–100)
BKR POTASSIUM: 4.5 mmol/L (ref 3.3–5.3)
BKR PROTEIN TOTAL: 7.9 g/dL (ref 6.6–8.7)
BKR SODIUM: 137 mmol/L (ref 136–144)
BKR WAM ABSOLUTE IMMATURE GRANULOCYTES.: >60 mL/min/1.73m2 (ref >60)
BKR WAM ABSOLUTE LYMPHOCYTE COUNT.: 3.8 g/dL — ABNORMAL HIGH (ref 2.3–3.5)
BKR WAM MCV: 98 mmol/L (ref 98–107)

## 2020-07-06 LAB — CBC WITH AUTO DIFFERENTIAL
BKR WAM ABSOLUTE NRBC (2 DEC): 0 x 1000/??L (ref 0.00–1.00)
BKR WAM ANALYZER ANC: 5.71 x 1000/??L (ref 2.00–7.60)
BKR WAM BASOPHIL ABSOLUTE COUNT.: 0.07 x 1000/??L (ref 0.00–1.00)
BKR WAM BASOPHILS: 0.9 % (ref 0.0–1.4)
BKR WAM EOSINOPHIL ABSOLUTE COUNT.: 0.24 x 1000/ÂµL (ref 0.00–1.00)
BKR WAM EOSINOPHILS: 2.9 % (ref 0.0–5.0)
BKR WAM HEMATOCRIT (2 DEC): 45.7 % (ref 38.50–50.00)
BKR WAM HEMOGLOBIN: 15 g/dL (ref 13.2–17.1)
BKR WAM IMMATURE GRANULOCYTES: 0.5 % (ref 0.0–1.0)
BKR WAM LYMPHOCYTES: 18.4 % (ref 17.0–50.0)
BKR WAM MCH (PG): 29.2 pg (ref 27.0–33.0)
BKR WAM MCHC: 32.8 g/dL (ref 31.0–36.0)
BKR WAM MONOCYTE ABSOLUTE COUNT.: 0.58 x 1000/ÂµL (ref 0.00–1.00)
BKR WAM MONOCYTES: 7.1 % (ref 4.0–12.0)
BKR WAM MPV: 10.9 fL (ref 8.0–12.0)
BKR WAM NEUTROPHILS: 70.2 % (ref 39.0–72.0)
BKR WAM NUCLEATED RED BLOOD CELLS: 0 % (ref 0.0–1.0)
BKR WAM PLATELETS: 258 x1000/??L (ref 150–420)
BKR WAM RDW-CV: 13.1 % — ABNORMAL HIGH (ref 11.0–15.0)
BKR WAM RED BLOOD CELL COUNT.: 5.13 M/??L (ref 4.00–6.00)
BKR WAM WHITE BLOOD CELL COUNT: 8.1 x1000/??L (ref 4.0–11.0)

## 2020-07-06 LAB — LACTATE DEHYDROGENASE: BKR LACTATE DEHYDROGENASE: 178 U/L (ref 122–241)

## 2020-07-06 NOTE — Progress Notes
HIC#: [id number] Title: [employer]/ A Phase 2 Randomized Study of Adjuvant Immunotherapy With the Personalized Cancer Vaccine [phone] and Pembrolizumab Versus Pembrolizumab Alone After Complete Resection of High Risk MelanomaSubject Name: Wilmore Holsomback: N/AMedical Record Number: [id number] Title of Informed Consent: COMPOUND AUTHORIZATION AND CONSENT FOR PARTICIPATION IN A RESEARCH PROJECTConsent Type: Verbal Notification of PI ChangeYCC/IRB Approval Date: Advarra Approval 26 Sep 2019 Revised 28 FEB 2022Protocol Version: 3Protocol Date: 11 MAR 2021Informed Consent obtained byInvestigator: Drema Halon, MDOther authorized personnel: Zeb Comfort [facility] 	Date of signed written informed consent: 06 Jul 2020 verbal given regarding the PI change. No need for written consent per Regulatory The above trial was presented in full to the participant.  The following topics were presented and discussed:-Study Title-Principal Investigator and Contact Information-Invitation to Participate and Description of Study-Purpose-Study Procedures and Randomization-Treatment Period-End of Treatment-Follow Up Period-Potential Risks, Side Effects, Discomforts, and Inconveniences-Methods of contraception-Benefits-Economic Considerations-Treatment Alternatives-Confidentiality and Authorization to collect, use, and disclose Protected Health Information-Voluntary Participation and Withdrawal The participant was given ample time to review the document and ask and receive answers to questions by the investigator.  The participant verbalized understanding of the above information and stated all their questions were answered to their satisfaction. A copy was of the consent with the new PI noted on it was offered to the participant to retain for their personal records. The participant agrees to comply with study and follow-up procedures. The Principal Investigator?s contact information was provided to the participant.Visit Comment:Devereaux declined a copy of the new consent after the Verbal Notification of the PI change. He claimed that he would contact us if he had any further questions

## 2020-07-06 NOTE — Progress Notes
HIC #?[id number]?Survival follow up?Patient seen and examined by?Drema Halon, MDScans reviewed?by?Drema Halon, MDPatient was sent for a biopsy of the suspicious subcutaneous lesion. Results pending Calendar was reviewed with patient and they will RTC per protocol pending biopsy resultsVerbal Re consent completed regarding PI change. See separate note??ECOG PS =?1?

## 2020-07-06 NOTE — Progress Notes
NAME:  Jason Rowland (10-10-1945)AGE: 75 y.o. MRN:  [id number] PROVIDER: Drema Halon, MD, PhDDATE OF SERVICE: 07/06/2020 [place] [facility] [place]  REASON FOR VISIT:  HIC [id number] follow-up visit/active surveillanceONC DX:  Invasive melanoma right mid backSTAGE:  IIIC (pT4a N3 M0); 30 mm with microsatellitosis, mitotic rate 2 per mm2.MOLECULAR PROFILING:0% tumor and immune cell PD-L1 staining50-gene panel:  DNA VARIANT DETECTED ? ? 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 ? 74% PRIOR TX:  - WLE and SLNB 03/03/19- Adjuvant Moderna clinical trial (?A Phase 2 Randomized Study of Adjuvant Immunotherapy with the Personalized Cancer Vaccine [phone] and Pembrolizumab Versus Pembrolizumab Alone After Complete Resection of High-Risk Melanoma? Protocol [id number], HIC 65784) C1D1 05/08/19. Last dose 04/27/20. Randomized to the pembrolizumab and vaccine arm.ACTIVE TX:   Surveillance and clinical trial f/u Onc Hx: [name] (prefers to be called Jason Rowland) is a 75 y.o. male who worked for 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  He reports it appeared approx 10 months prior; his PCP originally thought it was a pimple.  Dr. Lorn Junes at [facility] later evaluated it and also thought it was a pimple and injected it with intralesional kenalog x 2 and oral cephalexin without improvement.  The lesion increased in size and burned whenever he leaned against anything.  Denies night sweats and weight loss.  Lucie Leather did a I&D on 01/07/19  with only slight bloody discharge, so a punch biopsy was obtain with path showing metastatic melanoma.  On 01/20/19, additional biopsies were done to try to evaluate a primary site, but we do not have this tissue.  Per patient report, these biopsies were negative. MRI brain 01/30/19 without intracranial disease.  PET done 02/07/19 showing preepiglottic FDG activity s/p ENT evaluation which was negative for any visible malignancy.  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 LDH elevated to 251 at diagnosis. His case was presented at the [facility] tumor board on 02/13/19 and consensus was to ask ENT for random biopsies of the BOT and pre-epiglottic PET-avid areas, as we do not want to miss a head and neck cancer but otherwise not felt to be related to his melanoma.  The cervical LN were not thought to be related.  He should also have gastroenterology evaluation for a PET-avid stranding lesion in the transverse colon.  Dr. Landis Gandy (ENT) performed BOT and vallecula random biopsies 02/20/19 which only showed chronic inflammation and no malignancy.  He had a colonoscopy by Dr. Linton Flemings on 02/27/19 showing only a tubular adenoma in the sigmoid colon.  Overall, it was felt that his right mid back lesion is the site of his primary disease, and the metastatic melanoma pathology interpretation may have been complicated due to repeat I&Ds and steroid injections that may have altered the architecture of the primary lesion.  In the absence of any confirmed metastatic disease, he underwent WLE and LND with Dr. Duanne Moron on 03/03/19.  Final path showed a 30 mm thick melanoma, 2 mitoses/mm2, non-brisk TILs, no tumor regression, LVI+, microsatellites+, negative margins, and 3/35 lymph nodes (largest LN 4.5 x 3 cm) were positive for melanoma with presence of lymphatic invasion without extranodal extension.He uses a cane sometimes as he has some balance issues due to neuropathy in his feet.  Otherwise, he is fully functional, active, and  independent in ADLs/iADLs.  No hx of autoimmune disease.03/27/19 signed consent for the Moderna trial; randomized to pembro and vaccine arm.  Started treatment C1D1 05/08/19.04/24/19 noted to have wound dehiscence, non-infected, healing by secondary intent.  Re-instated VNA services to assist with dressing changes and packing.  Wound completely healed by 06/20/19.10/27/19, 01/12/20, and 04/24/20 surveillance scans NED.Interval ZO:XWRU returns for follow-up today and scan review which show a 1 cm SQ nodule in the right upper back near the scapula.  Saw his dermatology last month and again in Sept.  Psorasis a little worse today and on his arms b/l.  Has been using triamcinolone with good effect.He hasn't been taking his BPs at home as his machine is in [facility].  Plans to go to [facility] on April 12.  Jason Rowland notes the following today:?GI:-ongoing occasional gas pains Gr1 with sensation of bloating, intermittent relieved with flatus; slightly worse today but doesn't feel that he needs to try anything for it.?Extremities:- lower extremity swelling stable and minimal?Metabolic-?long-acting?insulin dose decr to 80 units qhs.  - fatigue?(Gr1)?since 3/11 which waxes and wanes and sl worse today; improved with exercise. ?Insomnia is improved; still taking his chronic Remus Loffler Thinks he needs a root canal as he has had some lower left jaw pain.Denies any fever. ?ECOG:  1Pain: 0Review of Systems:No fevers, chills, night sweats, lightheadedness, HA, CP, palpitations, wheezing, abd pain, constipation, blood in the urine or stools, dysuria.  Diabetic neuropathy in the hands and feet stable.  He has a prior hx of intermittent loose stools, chronic, without blood depending on his diet (often isolated to a specific restaurant he likes to visit with his buddies).  Positive responses on ROS as noted in interval hx. PAST MEDICAL HISTORY:Past Medical History: Diagnosis Date ? Diabetes mellitus (HC Code) (HC CODE)  ? High cholesterol  ? Malignant melanoma of torso excluding breast (HC Code) (HC CODE)  SURGICAL HISTORY:Past Surgical History: Procedure Laterality Date ? COLONOSCOPY   ? CORONARY ANGIOPLASTY WITH STENT PLACEMENT Bilateral  ? laryngoscopy with biopsy  02/20/2019 ? ROTATOR CUFF REPAIR Left 1999 ? TONSILLECTOMY    age 43 ? TOTAL HIP ARTHROPLASTY Right 2002 FAMILY HISTORY:Family History Problem Relation Age of Onset ? Heart disease Mother  ? Bladder cancer Father 96 ? Kidney cancer Sister  ? Leukemia Brother 49 ? No Known Problems Daughter  ? No Known Problems Son  SOCIAL HISTORY:Social History Socioeconomic History ? Marital status: Divorced   Spouse name: Not on file ? Number of children: Not on file ? Years of education: Not on file ? Highest education level: Not on file Occupational History ? Not on file Tobacco Use ? Smoking status: Never Smoker ? Smokeless tobacco: Never Used Vaping Use ? Vaping Use: Never used Substance and Sexual Activity ? Alcohol use: Not Currently ? Drug use: No ? Sexual activity: Not on file Other Topics Concern ? Not on file Social History Narrative  Divorced, works for [employer] in [place] will be retiring 03/17/19.  Has 1 son in [place] and 1 daughter in [place]. Llives in [employer]  Social Determinants of Health Financial Resource Strain:  ? Difficulty of Paying Living Expenses:  Food Insecurity:  ? Worried About Programme researcher, broadcasting/film/video in the Last Year:  ? Barista in the Last Year:  Transportation Needs:  ? Freight forwarder (Medical):  ? Lack of Transportation (Non-Medical):  Physical Activity:  ? Days of Exercise per Week:  ? Minutes of Exercise per Session:  Stress:  ? Feeling of  Stress :  Social Connections:  ? Frequency of Communication with Friends and Family:  ? Frequency of Social Gatherings with Friends and Family:  ? Attends Religious Services:  ? Active Member of Clubs or Organizations:  ? Attends Banker Meetings:  ? Marital Status:  Intimate Partner Violence:  ? Fear of Current or Ex-Partner:  ? Emotionally Abused:  ? Physically Abused:  ? Sexually Abused:  ALLERGIES:No Known AllergiesMEDICATIONS:Current Outpatient Medications Medication Sig ? aspirin 81 MG EC tablet Take 81 mg by mouth nightly.  ? FREESTYLE LIBRE 14 DAY sensor kit 1 each by Other route every 14 (fourteen) days. ? glipiZIDE (GLUCOTROL XL) 10 MG 24 hr tablet Take 10 mg by mouth nightly.  ? ibuprofen (ADVIL,MOTRIN) 200 mg tablet Take 2 tablets (400 mg total) by mouth every 6 (six) hours as needed (alternate with tylenol for mild to moderate pain). ? simvastatin (ZOCOR) 20 MG tablet Take 20 mg by mouth nightly.  ? TOUJEO SOLOSTAR U-300 300 unit/mL (1.5 mL) pen Inject 30 Units under the skin nightly. ? triamcinolone (KENALOG) 0.1 % ointment Apply topically 2 (two) times daily as needed. To areas of rash. ? zolpidem (AMBIEN) 10 mg tablet nightly.. ? acetaminophen (TYLENOL) 325 mg tablet Take 3 tablets (975 mg total) by mouth every 6 (six) hours as needed (mild to moderate pain). (Patient not taking: Reported on 04/27/2020) ? ALPRAZolam (XANAX) 0.5 mg tablet 0.5 mg.  (Patient not taking: Reported on 04/06/2020) ? docusate sodium (COLACE) 250 mg capsule Take 1 capsule (250 mg total) by mouth 2 (two) times daily as needed for constipation. (Patient not taking: Reported on 05/25/2020) ? olopatadine (PATANOL) 0.1 % ophthalmic solution as needed (takes for eye irritation as needed).  (Patient not taking: Reported on 04/06/2020) ? vit A/vit C/vit E/zinc/copper (PRESERVISION AREDS ORAL) Take by mouth 2 (two) times daily. (Patient not taking: Reported on 07/06/2020) No current facility-administered medications for this visit. VITALS: BP (!) 170/95  - Pulse 89  - Temp 98.1 ?F (36.7 ?C) (Temporal)  - Resp 18  - Wt 123.4 kg  - SpO2 98%  - BMI 38.85 kg/m?  PHYSICAL EXAM:Gen:  NAD, pleasant HEENT:  EOMI, PEARLLN:  No cervical, supraclavicular, axillary, or inguinal LAD. CV: No murmurs, tachy, no m/r/g Pulm:  CTA b/lAbd:  Soft, no tenderness, no guarding or rebound, no organomegaly, no ascitesExt:  WWP, 1+ symmetric edema in the ankles.  Lymphedema present in the right arm, unchanged.Neuro:   Grossly intactPsych:  Normal mood and affectSkin:  Scar on the right upper back well healed without evidence of recurrence. Small lateral skin puckering at the edges of the scar stable.  Flaking skin/dryness in the middle portion of the scar.  Right axillary scar well healed.  1 cm palpable nodule overlying the right scapula corresponding to [place] chest images.LABS:Results for orders placed or performed during the hospital encounter of 07/06/20 LACTATE DEHYDROGENASE Result Value Ref Range  LD 178 122 - 241 U/L CBC auto differential Result Value Ref Range  WBC 8.1 4.0 - 11.0 x1000/?L  RBC 5.13 4.00 - 6.00 M/?L  Hemoglobin 15.0 13.2 - 17.1 g/dL  Hematocrit 16.10 96.04 - 50.00 %  MCV 89.1 80.0 - 100.0 fL  MCH 29.2 27.0 - 33.0 pg  MCHC 32.8 31.0 - 36.0 g/dL  RDW-CV 54.0 98.1 - 19.1 %  Platelets 258 150 - 420 x1000/?L  MPV 10.9 8.0 - 12.0 fL  Neutrophils 70.2 39.0 - 72.0 %  Lymphocytes 18.4 17.0 - 50.0 %  Monocytes  7.1 4.0 - 12.0 %  Eosinophils 2.9 0.0 - 5.0 %  Basophil 0.9 0.0 - 1.4 %  Immature Granulocytes 0.5 0.0 - 1.0 %  nRBC 0.0 0.0 - 1.0 %  ANC(Abs Neutrophil Count) 5.71 2.00 - 7.60 x 1000/?L  Absolute Lymphocyte Count 1.50 0.60 - 3.70 x 1000/?L  Monocyte Absolute Count 0.58 0.00 - 1.00 x 1000/?L  Eosinophil Absolute Count 0.24 0.00 - 1.00 x 1000/?L  Basophil Absolute Count 0.07 0.00 - 1.00 x 1000/?L  Absolute Immature Granulocyte Count 0.04 0.00 - 0.30 x 1000/?L  Absolute nRBC 0.00 0.00 - 1.00 x 1000/?L Comprehensive metabolic panel Result Value Ref Range  Sodium 137 136 - 144 mmol/L  Potassium 4.5 3.3 - 5.3 mmol/L  Chloride 98 98 - 107 mmol/L  CO2 28 20 - 30 mmol/L  Anion Gap 11 7 - 17  Glucose 243 (H) 70 - 100 mg/dL  BUN 21 8 - 23 mg/dL  Creatinine 1.61 0.96 - 1.30 mg/dL  Calcium 9.6 8.8 - 04.5 mg/dL  BUN/Creatinine Ratio 40.9 8.0 - 23.0  Total Protein 7.9 6.6 - 8.7 g/dL  Albumin 4.1 3.6 - 4.9 g/dL  Total Bilirubin 0.4 <=8.1 mg/dL  Alkaline Phosphatase 86 9 - 122 U/L  Alanine Aminotransferase (ALT) 24 9 - 59 U/L  Aspartate Aminotransferase (AST) 22 10 - 35 U/L  Globulin 3.8 (H) 2.3 - 3.5 g/dL  A/G Ratio 1.1 1.0 - 2.2  AST/ALT Ratio 0.9 See Comment  eGFR (Afr Amer) >60 >60 mL/min/1.16m2  eGFR (NON African-American) >60 >60 mL/min/1.21m2 IMAGING/PATH:[place] Chest w IV ContrastResult Date: 3/21/2022Study: [place] CHEST W IV CONTRAST Indication: Melanoma, assess treatment response Comparison: [place] from 04/24/2020 Technique: [place] of the chest was performed after intravenous administration of 80 cc of Omnipaque 350. FINDINGS: Scattered sub-4 mm lung nodules are stable, the largest in the left lower lobe measures 4 mm (image 190, series 5). No new or enlarging lung nodule is identified. There is no thoracic lymphadenopathy or pleural effusion. A prominent left para-aortic node is stable (image 431, series 5). A 1.0 cm subcutaneous nodule in the right upper back was previously barely perceptible (image 108, series 5). No aggressive osseous lesion is seen. For findings below the diaphragms please see a separate Abdomen/Pelvis Ge[place] report.  Increased 1.0 cm subcutaneous nodule in the right upper back, otherwise stable exam. Reported And Signed By: Morene Rankins, MD  [facility] Radiology and Biomedical Imaging  Abdomen Pelvis w IV ContrastResult Date: 3/22/2022CT ABDOMEN PELVIS W IV CONTRAST Clinical: Melanoma, monitor Comparison: None No oral contrast and 80 cc of Omnipaque 350 intravenous contrast was administered. In the abdomen: Liver appears normal. Gallbladder contains a small dependent stones and/or sludge similar to the prior examination. Spleen appears normal. Pancreas appears normal. Adrenals appear normal. Right kidney again contains nonobstructing stone. Small cortical calcification at left kidney is again seen near site of cortical scarring. Aorta is not dilated. No enlarged lymph nodes are seen. Fat-containing umbilical hernia measuring up to 4.3 cm is again seen. In the pelvis: Small fat-containing inguinal hernias are again seen. Patient has right total hip arthroplasty causes streak artifact which slightly limits evaluation of the lower right pelvis. Appendix has normal appearance. Mild diverticulosis in colon is again seen. No destructive bone lesions are seen. Mild degenerative disc changes again seen in spine. Stranding in subcutaneous fat with skin clip at posterior lower back superficial to paraspinal muscles on right is unchanged. Lung bases appear normal. No evidence of metastatic disease or recurrence. Reported And Signed By:  Hart Rochester, MD  [facility] Radiology and Biomedical Imaging WLE and SLN 03/03/19:LYMPH NODES, RIGHT AXILLARY LEVELS 1, 2 AND 3, LYMPH NODE DISSECTION: ?  ? ? - ?THREE OF THIRTY-FIVE LYMPH NODES, POSITIVE FOR MELANOMA (3 OF 35) WITH LARGEST INVOLVED LYMPH NODE MEASURING 4.5 X 3 CM ? ? ?- LYMPHATIC INVASION IDENTIFIED ? ? ?- NO EXTRANODAL EXTENSION IDENTIFIED 1. ?SKIN, BACK, RIGHT UPPER, EXCISION: ? ? ? ?- MALIGNANT MELANOMA, SEE NOTE AND SYNOPTIC SUMMARY Note: Sections show a tumor composed of S100 positive epithelioid cells within the dermis and subcutaneous tissue. Cytokeratin AE1/AE3 and desmin are negative. A junctional component is not identified. The findings would be compatible with a primary dermal melanoma or a metastatic lesion. Clinical correlation is suggested. 2. ?SKIN, BACK, EXCISION: ? ? ? ?- BENIGN FIBROADIPOSE TISSUE AND SKELETAL MUSCLE SYNOPTIC SUMMARY MELANOMA OF THE SKIN Tumor Site: ? ? Back Laterality: ? ? Right Procedure: ? ? Primary excision Maximum Tumor Thickness (Depth): ? ? 30 mm Ulceration: ? ? Not identified Mitotic Rate (Mitoses/mm2): ? ? 2 Anatomic Level: ? ? V (Melanoma invades subcutis) Growth Phase: ? ? Vertical Histologic Type: ? ? Melanoma, NOS Tumor-Infiltrating Lymphocytes: ? ? Present, non-brisk Tumor Regression: ? ? Not identifed Lymphovascular Invasion: ? ? Present Microsatellitosis: ? ? Present Margins ? ? ?Peripheral Margins: ? ? Uninvolved Deep Margin: ? ? Uninvolved Stage (AJCC 8th Ed): ? ? pT4a Nx Base of the tongue and vallecula random biopsies 02/20/19: 1. ?TONGUE, LEFT, BASE, BIOPSY: ? ? ? ?- LYMPHOID TISSUE HYPERPLASIA ? ? ?- NEGATIVE FOR MALIGNANCY 2. ?THROAT, VALLECULA, BIOPSY: ? ?  ? - LYMPHOID TISSUE HYPERPLASIA AND FOCAL CHRONIC INFLAMMATION. SEE NOTE. ? ? ?- NEGATIVE FOR MALIGNANCY ? ? ? ? ? Note: Immunostain of Sox-10 to investigate focal pigment is negative, supporting above interpretation. 3. ?THROAT, LEFT VALLECULA, BIOPSY: ? ? ? ?- LYMPHOID TISSUE HYPERPLASIA ? ? ?- NEGATIVE FOR MALIGNANCY 4. ?THROAT, RIGHT VALLECULA, BIOPSY: ? ? ? ? ? ? - LYMPHOID TISSUE HYPERPLASIA AND FOCAL CHRONIC INFLAMMATION ? ? ?- NEGATIVE FOR MALIGNANCY 5. ?TONGUE, RIGHT BASE, BIOPSY: ? ? ? ? ? ? - LYMPHOID TISSUE HYPERPLASIA AND FOCAL CHRONIC INFLAMMATION ? ? ?- NEGATIVE FOR MALIGNANCY Pathology (reviewed at [facility]), collected 01/07/19:DIAGNOSIS: ? 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 Clinical pathological correlation is recommended. MICROSCOPIC DESCRIPTION: There are atypical cells in a nodule in the dermis. The cells are positive with SOX-10 and by report are negative with LCA, Kappa, Lambda, and a cytokeratin. The cells are also positive with S-100 by report.ASSESSMENT/PLAN:[name] is a 75 y.o. 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  Initially path read as metastatic melanoma, but further workup confirms that this is the primary site, not a metastatic lesion.  Initial biopsy was likely altered due to repeat I&Ds of the area.  He had a FBSE with his dermatologist and no additional primary lesions were identified.  He is otherwise in good health except for metabolic syndrome and diabetes-associated neuropathy.  PET 02/07/19 showed the main lesion on the right upper back extending to the latissimus dorsi muscle with involved right axillary nodes and nonspecific avidity in the BOT and preepiglottis for which direct visual evaluation was recommended and done 02/11/19 and random biopsies 02/20/19 without concerns for malignancy. Colonoscopy was also done to evaluate stranding in  the transverse colon, which was negative for malignancy on exam.  Now s/p WLE and LND with Dr. Duanne Moron on 03/03/19 showing a stage IIIC melanoma that was 30 mm thick, positive for LVI and microsatellites with 3/25 positive LNs.  Given the thickness of his primary melanoma and microsatellitosis, we felt that his melanoma-specific survival is closer to a stage IIID:  60% over 10 years with stage IIIC disease versus 24% with stage IIID.  He does not have a BRAF mutation, so SOC options are immune therapy at this point versus the Moderna clinical trial (?A Phase 2 Randomized Study of Adjuvant Immunotherapy with the Personalized Cancer Vaccine [phone] and Pembrolizumab Versus Pembrolizumab Alone After Complete Resection of High-Risk Melanoma?) vs active surveillance (for which he would be extremely high risk for relapsing).  He opted for the Moderna trial and signed consent on 03/27/19; tissue passed QC inspection.  Completed 1 year of adjuvant therapy on trial.Surveillance scans 07/05/20 showed an enlarged 1 cm SQ nodule overlying the right scapula which was biopsied today by Albin Felling.  Discussed that if positive, will order MRI brain and curative resection of the in-transit recurrence as there is no other site of disease.- labs reviewed today.  LDH wnl. TSH pending.  - [place] [facility] 7/12 NED; [place] [facility] 01/12/20 showing a non-specific 1.1 cm left subpectoral LN related to COVID-19 vaccination (no longer palpable on subsequent exams), no definitive evidence of metastatic disease. Scans 04/25/19 NED.  Scans 07/05/20 showed a new 1.0 cm subcutaneous nodule in the right upper back, which is concerning and was biopsied today.- routine f/u with dermatologist every 4-6 months.  Last seen a month ago. - to monitor BP; he is aware his values are higher today.- RTC in 3 months if bx is negative but likely sooner given the high suspicion of recurrent.  Path results will be available before his planned trip to [facility]. 20 min were spent in direct patient contact.  He has our clinic contact information and instructed to call if questions.  Drema Halon, MD, PhDMedical Oncology

## 2020-07-09 ENCOUNTER — Telehealth: Admit: 2020-07-09 | Payer: PRIVATE HEALTH INSURANCE

## 2020-07-09 DIAGNOSIS — C4359 Malignant melanoma of other part of trunk: Secondary | ICD-10-CM

## 2020-07-09 NOTE — Telephone Encounter
Spoke with Jason Rowland this morning regarding his biopsy results, which were not diagnostic.  Recommended that he get a repeat biopsy with IR under US guidance to make sure we are sampling the lesion appropriately.  He is agreeable to proceed.  Will try to get the biopsy done before his trip to [facility].Electronically Signed by Drema Halon, MD, July 09, 2020

## 2020-07-13 ENCOUNTER — Ambulatory Visit: Admit: 2020-07-13 | Payer: PRIVATE HEALTH INSURANCE | Attending: Complex General Surgical Oncology

## 2020-07-20 ENCOUNTER — Inpatient Hospital Stay: Admit: 2020-07-20 | Discharge: 2020-07-20 | Payer: BLUE CROSS/BLUE SHIELD

## 2020-07-20 DIAGNOSIS — C4359 Malignant melanoma of other part of trunk: Secondary | ICD-10-CM

## 2020-07-20 DIAGNOSIS — Z01812 Encounter for preprocedural laboratory examination: Secondary | ICD-10-CM

## 2020-07-20 DIAGNOSIS — Z20822 Contact with and (suspected) exposure to covid-19: Secondary | ICD-10-CM

## 2020-07-20 NOTE — Progress Notes
CC: New cm nodule of righft upper back on scanHPI: Jason Rowland is a 75 yo M with a PMH of a 30 mm ulcerated melanoma with 3 out of 35 lymph nodes positive for melanoma. He was started on a clinical trail and randomized the moderna vaccine with pembro arm. He received treatment for one year and has been undergoing surveillance. His most recent scans reveal a 1cm subcutaneous nodule of his right upper back. He presents today for possible biopsyO/E:BP (!) 170/95  - Pulse 89  - Temp 98.1 ?F (36.7 ?C) (Temporal)  - Resp 18  - Wt 123.4 kg  - SpO2 98%  - BMI 38.85 kg/m?  No acute distressNo labored breathingR upper back firm sub cm noduleAfter informed written consent was obtained, chloraprep was used for cleansing. With sterile technique around 1cc of lidocaine was used. a small incision was made with the 15 blade. Core needle gun was then used  to obtain a biopsy specimen of the lesion x3 Hemostasis was obtained by pressure and dressing. Antibiotic dressing is applied, and wound care instructions provided. Be alert for any signs of cutaneous infection. The specimen was labeled and sent to pathology for evaluation. The procedure was well tolerated without complications.

## 2020-07-20 NOTE — Progress Notes
Core needle biopsy was performed by C. Becerra, PA of right upper back nodule. A time out was done prior to procedure. Jason Rowland tolerated procedure well. Post procedure instructions reviewed. He will contact our office with any questions.

## 2020-07-21 LAB — COVID-19 CLEARANCE OR FOR PLACEMENT ONLY: BKR SARS-COV-2 RNA (COVID-19) (YH): NEGATIVE % (ref 4.0–12.0)

## 2020-07-22 ENCOUNTER — Encounter: Admit: 2020-07-22 | Payer: PRIVATE HEALTH INSURANCE | Attending: Complex General Surgical Oncology

## 2020-07-22 ENCOUNTER — Inpatient Hospital Stay: Admit: 2020-07-22 | Discharge: 2020-07-22 | Payer: BLUE CROSS/BLUE SHIELD

## 2020-07-22 DIAGNOSIS — E119 Type 2 diabetes mellitus without complications: Secondary | ICD-10-CM

## 2020-07-22 DIAGNOSIS — E7800 Pure hypercholesterolemia, unspecified: Secondary | ICD-10-CM

## 2020-07-22 DIAGNOSIS — N200 Calculus of kidney: Secondary | ICD-10-CM

## 2020-07-22 DIAGNOSIS — C4359 Malignant melanoma of other part of trunk: Secondary | ICD-10-CM

## 2020-07-22 MED ORDER — LIDOCAINE 1 %-EPINEPHRINE 1:100,000 INJECTION SOLUTION
1 %-:00,000 | Status: CP
Start: 2020-07-22 — End: ?

## 2020-07-22 MED ORDER — SODIUM CHLORIDE 0.9 % IRRIGATION SOLUTION
0.9 % irrigation | Status: CP | PRN
Start: 2020-07-22 — End: ?
  Administered 2020-07-22: 15:00:00 0.9 % irrigation

## 2020-07-22 MED ORDER — INDOMETHACIN 25 MG CAPSULE
25 mg | Freq: Three times a day (TID) | Status: AC
Start: 2020-07-22 — End: ?

## 2020-07-22 MED ORDER — LIDOCAINE-EPINEPHRINE 0.5 %-1:200,000 INJECTION SOLUTION
0.5 %-1:200,000 | Status: DC | PRN
Start: 2020-07-22 — End: 2020-07-22
  Administered 2020-07-22: 15:00:00 0.5 %-1:200,000

## 2020-07-22 NOTE — Discharge Instructions
Physician Discharge Instructions The hospital and staff members extend their best wishes to you as you are discharged. Because we are most concerned with your health, we suggest you carefully read the following instructions: Recommended diet: Regular, as tolerated. If you feel nauseated, you should slow down. Sometimes, anesthesia can alter your food tolerance so it is best to go slow!Recommended activity: Walk as much as you can, Out of bed as much as you can, No bending, No lifting , No swimming, No tub bathing. It is OK to take a shower, just do not scrub your wounds. Pat them dry with a clean towel once done. Call your Doctor: If you have bleeding, swelling, redness, increased warmth, increased drainage, more pain/discomfort from the wound If you have a temperature (fever) higher than 101 degrees fahrenheit (F) If you have trouble breathing, fast or slow heart beat, dizziness that does not go away after resting for 15 minutes If you have nausea or poor appetite.Generalized concerns.Wound Care:You may shower after 48 hours.  Remove your outer dressing.  Allow water to rinse over the white steri-strips and gently pat dry.  Do not rub or scrub.In a week you can get in the pool but try to keep the wound itself out of the water and not sitting submerged in water for long periods of time. No submerging yourself in hot tub or salt water until skin is fully healed. Please no tub bathing or hot tubs until fully healed and seen in follow-up.Monitor wound for any redness, swelling, drainage.  Call with any questions.Activity:Return to your normal activity as tolerated.   Walking around is encouraged.  Walking up and down stairs is fine. Pain management:Alternate tylenol and motrin as needed for pain for the next 5 to 7 days.

## 2020-07-22 NOTE — Other
Surgically Focused H&PHPI: 75 y.o. 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 Now with new right upper back nodule s/p biopsy that was non-diagnostic. Presenting today for excision of the right upper back nodule. PE:GEN: NADCV: RRRPULM: CTABBACK: Right upper back nodule palpated and marked. Ols scar in mid right back from prior melanoma excision.A&P: &75 y.o. male with prior h/o melanoma s/p WLE and ALND with 3?35 + LNs now with new nodule of right upper back going to the OR for excision. Consent had already been obtained. Elton Sin, MDGeneral Surgery Resident, PGY3

## 2020-07-22 NOTE — Brief Op Note
[facility]        [id number] Patient DOB: 04/21/1945     Surgery Date: 4/7/2022Surgeon(s) and Role:   Thayer Headings, MD - PrimaryAssistant(s):Resident: Faythe Dingwall, [place], MDStaff:  Circulator: Wynelle Cleveland, RNRelief Circulator: Moises Blood, RNRelief Scrub: Pipping, Melanie, RNScrub Person: Morrell Riddle Diagnosis: * No pre-op diagnosis entered * Procedure(s) and Anesthesia Type:   * EXCISION RIGHT UPPER BACK MASS - LOCALOperative Findings (enter relevant operative findings; do not refer to an operative report that is not yet transcribed): Right upper back pigmented nodule excised. Signs of infection present at the time of surgery at the operative site: None Blood and Blood Products: none                 Drains:  noneImplants: * No implants in log * Specimens: ID Type Source Tests Collected by Time 1 : right upper back subcutaneous mass R/O intransit melanoma Tissue Skin, DERMPATH ([facility]) PATHOLOGY ([facility]) Thayer Headings, MD 07/22/2020 11:21 AM  EBL: 20 mL       Post Operative Diagnosis: * No post-op diagnosis entered * [name], MD4/7/202212:29 PM

## 2020-07-27 ENCOUNTER — Telehealth: Admit: 2020-07-27 | Payer: PRIVATE HEALTH INSURANCE

## 2020-07-27 DIAGNOSIS — C4359 Malignant melanoma of other part of trunk: Secondary | ICD-10-CM

## 2020-07-27 NOTE — Telephone Encounter
Called Jason Rowland to review his path results.  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  He does not have a BRAF mutation.  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 Duanne Moron.  Will do shorter interval scans in the next year given his recent recurrence.  MRI brain and [place] CAP ordered to be done prior to his 6/14 visit.  Would monitor with active surveillance.  No further adjuvant therapy at this time.Electronically Signed by Drema Halon, MD, July 27, 2020

## 2020-07-28 NOTE — Other
NAMEDeon Rowland: [id number] DATE OF PROCEDURE/SURGERY: 4/7/2022PREOP DIAGNOSIS: Possible in transit Melanoma of right upper backPOSTOP DIAGNOSIS: Possible in transit Melanoma of right upper backOPERATION: Wide local excision of Possible in transit Melanoma of right upper backSURGEON: Thayer Headings, M.D. Threasa HeadsAndrena Mews, M.D.ANESTHESIA: Local anesthesia   ESTIMATED BLOOD LOSS: minSPECIMEN: Excision of in transit melanoma of right upper backINDICATIONS: The patient is a 75 year-old gentleman with a newly found subcutaneous mass in the right upper back worrisome for an in transit melanoma. He was referred and seen in Surgical Oncology clinic.  The recommendation was for a wide excision of the possible in transit melanoma site.  The risks, benefits and alternatives of the procedure were discussed.  The patient understood all of these and wished to proceed with surgery.PROCEDURE: The patient was identified in the pre-operative holding area and brought back to the Operating Room.    The patient was placed in prone position with all pressure points appropriately padded, and a time-out was performed which included indication of all of the members of the Operating Room, as well as the patient's name, MR number, date of birth and procedure to be performed.   The patient was then prepped and draped in sterile and usual fashion.An incision was made over the palpable deep nodule   A total of 20 cc of 0.5% lidocaine with epinephrine was injected into the skin and subcutaneous layer for local anesthesia.  The incision was made with a scalpel, and dissection was carried down to the subcutaneous tissue and fascial layer using electrocautery.  Dissection continued until the the entire specimen was removed in its entirety. The mass had pigmentation and was nearly 2x2cm in size with a rim of surrounding adiopose tissue. There was a deep muscle perforator which was then ligated. The specimen was oriented for the pathologist and submitted to pathology.  Hemostasis was assured and the wound was copiously irrigated.  We then proceeded to close the incision.   The incision was then closed using buried interrupted 3-0 Vicryl sutures for the deep layer, buried interrupted 4-0 Vicryl suture for the dermal layer, and 4-0 monocryl for the subcuticular layer.  The wound was covered using sterile Steri-Strips and a sterile dressing.  All sponge, needle and instrument counts were correct at the end of the case.  The patient tolerated the procedure well with no complications and no issues.  The patient was taken to the recovery room in stable condition.  I as the attending surgeon was present for the entire procedure.Thayer Headings, MD

## 2020-08-23 ENCOUNTER — Telehealth: Admit: 2020-08-23 | Payer: PRIVATE HEALTH INSURANCE

## 2020-08-23 NOTE — Telephone Encounter
Pt called stating he received a call to call back clinic.  Pt is Clinical Trials Pt.

## 2020-08-24 ENCOUNTER — Telehealth: Admit: 2020-08-24 | Payer: PRIVATE HEALTH INSURANCE

## 2020-08-24 NOTE — Telephone Encounter
Patient is asking for his appointment to be moved to June 7th so it can be closer to his scans. These appointments were booked by research team. Please advise if clinical trials can move patient and contact him to make him aware.

## 2020-08-24 NOTE — Telephone Encounter
Message sent to research team. Electronically Signed by Alda Ponder, RN, Aug 24, 2020

## 2020-08-24 NOTE — Telephone Encounter
Appointments have been moved to 6/7 and patient has been notified.

## 2020-09-18 ENCOUNTER — Inpatient Hospital Stay: Admit: 2020-09-18 | Discharge: 2020-09-18 | Payer: BLUE CROSS/BLUE SHIELD

## 2020-09-18 DIAGNOSIS — Z006 Encounter for examination for normal comparison and control in clinical research program: Secondary | ICD-10-CM

## 2020-09-18 DIAGNOSIS — C4359 Malignant melanoma of other part of trunk: Secondary | ICD-10-CM

## 2020-09-18 DIAGNOSIS — G9389 Other specified disorders of brain: Secondary | ICD-10-CM

## 2020-09-18 MED ORDER — IOHEXOL 350 MG IODINE/ML INTRAVENOUS SOLUTION
350 mg iodine/mL | Freq: Once | INTRAVENOUS | Status: CP | PRN
Start: 2020-09-18 — End: ?
  Administered 2020-09-18: 16:00:00 350 mL via INTRAVENOUS

## 2020-09-18 MED ORDER — SODIUM CHLORIDE 0.9 % LARGE VOLUME SYRINGE FOR AUTOINJECTOR
Freq: Once | INTRAVENOUS | Status: CP | PRN
Start: 2020-09-18 — End: ?
  Administered 2020-09-18: 16:00:00 via INTRAVENOUS

## 2020-09-18 MED ORDER — GADOTERATE MEGLUMINE 0.5 MMOL/ML (376.9 MG/ML) INTRAVENOUS SOLUTION
0.5 mmol/mL (376.9 mg/mL) | Freq: Once | INTRAVENOUS | Status: CP | PRN
Start: 2020-09-18 — End: ?
  Administered 2020-09-18: 18:00:00 0.5 mL via INTRAVENOUS

## 2020-09-21 ENCOUNTER — Inpatient Hospital Stay: Admit: 2020-09-21 | Discharge: 2020-09-21 | Payer: BLUE CROSS/BLUE SHIELD

## 2020-09-21 ENCOUNTER — Ambulatory Visit: Admit: 2020-09-21 | Payer: BLUE CROSS/BLUE SHIELD

## 2020-09-21 ENCOUNTER — Encounter: Admit: 2020-09-21 | Payer: PRIVATE HEALTH INSURANCE

## 2020-09-21 DIAGNOSIS — C4359 Malignant melanoma of other part of trunk: Secondary | ICD-10-CM

## 2020-09-21 DIAGNOSIS — C7931 Secondary malignant neoplasm of brain: Secondary | ICD-10-CM

## 2020-09-21 DIAGNOSIS — N200 Calculus of kidney: Secondary | ICD-10-CM

## 2020-09-21 DIAGNOSIS — G939 Disorder of brain, unspecified: Secondary | ICD-10-CM

## 2020-09-21 DIAGNOSIS — E7800 Pure hypercholesterolemia, unspecified: Secondary | ICD-10-CM

## 2020-09-21 DIAGNOSIS — E119 Type 2 diabetes mellitus without complications: Secondary | ICD-10-CM

## 2020-09-21 LAB — CBC WITH AUTO DIFFERENTIAL
BKR WAM ABSOLUTE IMMATURE GRANULOCYTES.: 0.07 x 1000/??L (ref 0.00–0.30)
BKR WAM ABSOLUTE LYMPHOCYTE COUNT.: 2.03 x 1000/??L (ref >60)
BKR WAM ABSOLUTE NRBC (2 DEC): 0 x 1000/??L (ref 0.00–1.00)
BKR WAM ANALYZER ANC: 6.76 x 1000/??L (ref >60)
BKR WAM BASOPHIL ABSOLUTE COUNT.: 0.05 x 1000/??L (ref 0.00–1.00)
BKR WAM BASOPHILS: 0.5 % — ABNORMAL HIGH (ref 0.0–1.4)
BKR WAM EOSINOPHIL ABSOLUTE COUNT.: 0.26 x 1000/??L (ref 0.00–1.00)
BKR WAM EOSINOPHILS: 2.6 % (ref 0.0–5.0)
BKR WAM HEMATOCRIT (2 DEC): 43 % (ref 38.50–50.00)
BKR WAM HEMOGLOBIN: 14.3 g/dL (ref 13.2–17.1)
BKR WAM IMMATURE GRANULOCYTES: 0.7 % (ref 0.0–1.0)
BKR WAM LYMPHOCYTES: 20.5 % (ref 17.0–50.0)
BKR WAM MCH (PG): 29.3 pg (ref 27.0–33.0)
BKR WAM MCHC: 33.3 g/dL (ref 31.0–36.0)
BKR WAM MCV: 88.1 fL (ref 80.0–100.0)
BKR WAM MONOCYTE ABSOLUTE COUNT.: 0.75 x 1000/??L (ref 0.00–1.00)
BKR WAM MPV: 10.3 fL (ref 8.0–12.0)
BKR WAM NEUTROPHILS: 68.1 % (ref 39.0–72.0)
BKR WAM NUCLEATED RED BLOOD CELLS: 0 % (ref 0.0–1.0)
BKR WAM PLATELETS: 294 x1000/??L (ref 150–420)
BKR WAM RDW-CV: 13.3 % (ref 11.0–15.0)
BKR WAM RED BLOOD CELL COUNT.: 4.88 M/??L (ref 4.00–6.00)
BKR WAM WHITE BLOOD CELL COUNT: 9.9 x1000/??L (ref 4.0–11.0)

## 2020-09-21 LAB — COMPREHENSIVE METABOLIC PANEL
BKR A/G RATIO: 1.1 % (ref 1.0–2.2)
BKR ALANINE AMINOTRANSFERASE (ALT): 23 U/L (ref 9–59)
BKR ALBUMIN: 4.1 g/dL (ref 3.6–4.9)
BKR ALKALINE PHOSPHATASE: 77 U/L (ref 9–122)
BKR ANION GAP: 12 (ref 7–17)
BKR ASPARTATE AMINOTRANSFERASE (AST): 27 U/L (ref 10–35)
BKR AST/ALT RATIO: 1.2
BKR BILIRUBIN TOTAL: 0.4 mg/dL (ref <=1.2)
BKR BLOOD UREA NITROGEN: 21 mg/dL (ref 8–23)
BKR BUN / CREAT RATIO: 22.8 % (ref 8.0–23.0)
BKR CALCIUM: 9 mg/dL (ref 8.8–10.2)
BKR CHLORIDE: 99 mmol/L (ref 98–107)
BKR CO2: 28 mmol/L (ref 20–30)
BKR CREATININE: 0.92 mg/dL (ref 0.40–1.30)
BKR EGFR (AFR AMER): >60 mL/min/{1.73_m2} (ref >60)
BKR EGFR (NON AFRICAN AMERICAN): >60 mL/min/{1.73_m2} (ref >60)
BKR GLOBULIN: 3.6 g/dL — ABNORMAL HIGH (ref 2.3–3.5)
BKR GLUCOSE: 249 mg/dL — ABNORMAL HIGH (ref 70–100)
BKR POTASSIUM: 4 mmol/L (ref 3.3–5.3)
BKR PROTEIN TOTAL: 7.7 g/dL (ref 6.6–8.7)
BKR SODIUM: 139 mmol/L (ref 136–144)
BKR WAM MONOCYTES: 23 U/L (ref 9–59)

## 2020-09-21 LAB — PT/INR AND PTT (BH GH L LMW YH)
BKR INR: 1.04 (ref 0.92–1.08)
BKR PARTIAL THROMBOPLASTIN TIME: 25.7 s (ref 22.7–28.9)
BKR PROTHROMBIN TIME: 11 s (ref 9.8–11.4)

## 2020-09-21 LAB — TSH: BKR THYROID STIMULATING HORMONE: 3.56 ??IU/mL

## 2020-09-21 LAB — PHOSPHORUS     (BH GH L LMW YH): BKR PHOSPHORUS: 2.8 mg/dL (ref 2.2–4.5)

## 2020-09-21 LAB — LACTATE DEHYDROGENASE: BKR LACTATE DEHYDROGENASE: 177 U/L (ref 122–241)

## 2020-09-21 LAB — TSH W/REFLEX TO FT4     (BH GH LMW Q YH): BKR THYROID STIMULATING HORMONE: 3.56 ??IU/mL

## 2020-09-21 LAB — T3: BKR T3 TOTAL: 79.6 ng/dL

## 2020-09-21 LAB — T4, FREE: BKR FREE T4: 1.01 ng/dL

## 2020-09-28 ENCOUNTER — Ambulatory Visit: Admit: 2020-09-28 | Payer: PRIVATE HEALTH INSURANCE

## 2020-09-30 ENCOUNTER — Encounter: Admit: 2020-09-30 | Payer: PRIVATE HEALTH INSURANCE

## 2020-09-30 DIAGNOSIS — N200 Calculus of kidney: Secondary | ICD-10-CM

## 2020-09-30 DIAGNOSIS — C4359 Malignant melanoma of other part of trunk: Secondary | ICD-10-CM

## 2020-09-30 DIAGNOSIS — E119 Type 2 diabetes mellitus without complications: Secondary | ICD-10-CM

## 2020-09-30 DIAGNOSIS — E7800 Pure hypercholesterolemia, unspecified: Secondary | ICD-10-CM

## 2020-09-30 DIAGNOSIS — C7931 Secondary malignant neoplasm of brain: Secondary | ICD-10-CM

## 2020-09-30 NOTE — Progress Notes
NAME:  Jason Rowland (04-Jul-1945)AGE: 75 y.o. MRN:  [id number] PROVIDER: Drema Halon, MD, PhDDATE OF SERVICE: 09/21/2020 [facility] [place]  REASON FOR VISIT:  HIC [id number] follow-up visit/active surveillanceONC DX:  Invasive melanoma right mid backSTAGE:  Likely IV (pT4a N3 M1d(0) - originally stage IIIC with 30 mm with microsatellitosis, mitotic rate 2 per mm2).  Noted 09/18/20 to have new brain lesions (too small for biopsy).MOLECULAR PROFILING:0% tumor and immune cell PD-L1 staining50-gene panel:  DNA VARIANT DETECTED ? ? 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 ? 74% PRIOR TX:  - WLE and SLNB 03/03/19- Adjuvant Moderna clinical trial (?A Phase 2 Randomized Study of Adjuvant Immunotherapy with the Personalized Cancer Vaccine [phone] and Pembrolizumab Versus Pembrolizumab Alone After Complete Resection of High-Risk Melanoma? Protocol [id number], HIC 56213) C1D1 05/08/19. Last dose 04/27/20. Randomized to the pembrolizumab and vaccine arm.- 07/22/20 resection of recurrent LN in the right upper back (1.6 cm LN with 1.1 cm tumor deposit)ACTIVE TX:   Surveillance Onc Hx: Jason Rowland (prefers to be called Jason Rowland) is a 75 y.o. male who worked for 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  He reports it appeared approx 10 months prior; his PCP originally thought it was a pimple.  Dr. Lorn Junes at [facility] later evaluated it and also thought it was a pimple and injected it with intralesional kenalog x 2 and oral cephalexin without improvement.  The lesion increased in size and burned whenever he leaned against anything.  Denies night sweats and weight loss.  Lucie Leather did a I&D on 01/07/19  with only slight bloody discharge, so a punch biopsy was obtain with path showing metastatic melanoma.  On 01/20/19, additional biopsies were done to try to evaluate a primary site, but we do not have this tissue.  Per patient report, these biopsies were negative. MRI brain 01/30/19 without intracranial disease.  PET done 02/07/19 showing preepiglottic FDG activity s/p ENT evaluation which was negative for any visible malignancy.  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 LDH elevated to 251 at diagnosis. His case was presented at the [facility] tumor board on 02/13/19 and consensus was to ask ENT for random biopsies of the BOT and pre-epiglottic PET-avid areas, as we do not want to miss a head and neck cancer but otherwise not felt to be related to his melanoma.  The cervical LN were not thought to be related.  He should also have gastroenterology evaluation for a PET-avid stranding lesion in the transverse colon.  Dr. Landis Gandy (ENT) performed BOT and vallecula random biopsies 02/20/19 which only showed chronic inflammation and no malignancy.  He had a colonoscopy by Dr. Linton Flemings on 02/27/19 showing only a tubular adenoma in the sigmoid colon.  Overall, it was felt that his right mid back lesion is the site of his primary disease, and the metastatic melanoma pathology interpretation may have been complicated due to repeat I&Ds and steroid injections that may have altered the architecture of the primary lesion.  In the absence of any confirmed metastatic disease, he underwent WLE and LND with Dr. Duanne Moron on 03/03/19.  Final path showed a 30 mm thick melanoma, 2 mitoses/mm2, non-brisk TILs, no tumor regression, LVI+, microsatellites+, negative margins, and 3/35 lymph nodes (largest LN 4.5 x 3 cm) were positive for melanoma  with presence of lymphatic invasion without extranodal extension.He uses a cane sometimes as he has some balance issues due to neuropathy in his feet.  Otherwise, he is fully functional, active, and independent in ADLs/iADLs.  No hx of autoimmune disease.03/27/19 signed consent for the Moderna trial; randomized to pembro and vaccine arm.  Started treatment C1D1 05/08/19.04/24/19 noted to have wound dehiscence, non-infected, healing by secondary intent.  Re-instated VNA services to assist with dressing changes and packing.  Wound completely healed by 06/20/19.10/27/19, 01/12/20, and 04/24/20 surveillance scans NED.07/05/20 - [place] chest showing an increased 1.0 cm subcutaneous nodule in the right upper back.  Bx 07/06/20 not diagnostic.  Excision by Dr. Duanne Moron 07/22/20 showed metastatic melanoma involving 1 LN with 11 mm tumor deposit in a 1.6 cm LN.   09/18/20 - MRI brain showing a 8 mm left hippocampus lesion and a 6 mm lesion in the left mesial temporal lobe.  Reports new subtle memory issues.Interval ZO:XWRU returns for follow-up today and scan review which now shows a lesion in the left temporal lobe and left hippocampus of the brain.  He has been noticing some mild memory issues.  He also has had worsening fatigue over the last 3-4 weeks.  He ability to taste is also worse.  Ongoing insomnia.  Denies any fever or N/V.ECOG:  1Pain: 3/10Review of Systems:No fevers, chills, night sweats, lightheadedness, HA, CP, palpitations, wheezing, abd pain, constipation, blood in the urine or stools, dysuria.  Diabetic neuropathy in the hands and feet stable.  He has a prior hx of intermittent loose stools, chronic, without blood depending on his diet (often isolated to a specific restaurant he likes to visit with his buddies).  Positive responses on ROS as noted in interval hx. PAST MEDICAL HISTORY:Past Medical History: Diagnosis Date ? Diabetes mellitus (HC Code) (HC CODE) (HC Code)  ? High cholesterol  ? Kidney stones   40 years ago ? 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 Malignant melanoma of torso excluding breast (HC Code)  SURGICAL HISTORY:Past Surgical History: Procedure Laterality Date ? COLONOSCOPY   ? CORONARY ANGIOPLASTY WITH STENT PLACEMENT Bilateral   patient denies ? laryngoscopy with biopsy  02/20/2019 ? ROTATOR CUFF REPAIR Left 1999 ? TONSILLECTOMY    age 42 ? TOTAL HIP ARTHROPLASTY Right 2002 FAMILY HISTORY:Family History Problem Relation Age of Onset ? Heart disease Mother  ? Bladder cancer Father 95 ? Kidney cancer Sister  ? Leukemia Brother 30 ? No Known Problems Daughter  ? No Known Problems Son  SOCIAL HISTORY:Social History Socioeconomic History ? Marital status: Divorced   Spouse name: Not on file ? Number of children: Not on file ? Years of education: Not on file ? Highest education level: Not on file Occupational History ? Not on file Tobacco Use ? Smoking status: Never Smoker ? Smokeless tobacco: Never Used Vaping Use ? Vaping Use: Never used Substance and Sexual Activity ? Alcohol use: Not Currently ? Drug use: No ? Sexual activity: Not on file Other Topics Concern ? Not on file Social History Narrative  Divorced, works for [employer] in [place] will be retiring 03/17/19.  Has 1 son in [place] and 1 daughter in [place]. Llives in [place]  Social Determinants of Health Financial Resource Strain: Not on file Food Insecurity: Not on file Transportation Needs: Not on file Physical Activity: Not on file Stress: Not on file Social Connections: Not on file Intimate Partner Violence:  Not on file Housing Stability: Not on file ALLERGIES:No Known AllergiesMEDICATIONS:Current Outpatient Medications Medication Sig ? acetaminophen (TYLENOL) 325 mg tablet Take 3 tablets (975 mg total) by mouth every 6 (six) hours as needed (mild to moderate pain). (Patient not taking: Reported on 04/27/2020) ? ALPRAZolam (XANAX) 0.5 mg tablet 0.5 mg.  (Patient not taking: Reported on 04/06/2020) ? aspirin 81 MG EC tablet Take 81 mg by mouth nightly.  ? docusate sodium (COLACE) 250 mg capsule Take 1 capsule (250 mg total) by mouth 2 (two) times daily as needed for constipation. (Patient not taking: Reported on 05/25/2020) ? FREESTYLE LIBRE 14 DAY sensor kit 1 each by Other route every 14 (fourteen) days. ? glipiZIDE (GLUCOTROL XL) 10 MG 24 hr tablet Take 10 mg by mouth nightly.  ? ibuprofen (ADVIL,MOTRIN) 200 mg tablet Take 2 tablets (400 mg total) by mouth every 6 (six) hours as needed (alternate with tylenol for mild to moderate pain). ? indomethacin (INDOCIN) 25 mg capsule 3 (three) times daily. (Patient not taking: Reported on 07/22/2020) ? olopatadine (PATANOL) 0.1 % ophthalmic solution as needed (takes for eye irritation as needed).  (Patient not taking: Reported on 04/06/2020) ? simvastatin (ZOCOR) 20 MG tablet Take 20 mg by mouth nightly.  ? TOUJEO SOLOSTAR U-300 300 unit/mL (1.5 mL) pen Inject 30 Units under the skin nightly. ? triamcinolone (KENALOG) 0.1 % ointment Apply topically 2 (two) times daily as needed. To areas of rash. ? vit A/vit C/vit E/zinc/copper (PRESERVISION AREDS ORAL) Take by mouth 2 (two) times daily. (Patient not taking: Reported on 07/06/2020) ? zolpidem (AMBIEN) 10 mg tablet nightly.. No current facility-administered medications for this visit. VITALS: BP 127/78T 97.9FHR 93RR 18SpO2 98%PHYSICAL EXAM:Gen:  NAD, pleasant HEENT:  EOMI, PEARLLN:  No cervical, supraclavicular, axillary, or inguinal LAD. CV:  No murmurs, mild tachy, no m/r/g Pulm:  CTA b/lAbd:  Soft, no tenderness, no guarding or rebound, no organomegaly, no ascitesExt:  WWP, 1+ symmetric edema in the ankles.  Lymphedema present in the right arm, unchanged.Neuro:   No focal deficitsPsych:  Normal mood and affectSkin:  Scar on the right upper back well healed without evidence of recurrence. Small lateral skin puckering at the edges of the scar stable.  Flaking skin/dryness in the middle portion of the scar.  Right axillary scar well healed.  Scar from LN recurrence well healed.LABS:Results for orders placed or performed during the hospital encounter of 09/21/20 LACTATE DEHYDROGENASE Result Value Ref Range  LD 177 122 - 241 U/L TSH w/reflex to FT4     (BH GH LMW Q YH) Result Value Ref Range  Thyroid Stimulating Hormone 3.560 See Comment ?IU/mL T3 Result Value Ref Range  T3, Total 79.6 See Comment ng/dL T4, free Result Value Ref Range  Free T4 1.01 See Comment ng/dL TSH Result Value Ref Range  Thyroid Stimulating Hormone 3.560 See Comment ?IU/mL Comprehensive metabolic panel Result Value Ref Range  Sodium 139 136 - 144 mmol/L  Potassium 4.0 3.3 - 5.3 mmol/L  Chloride 99 98 - 107 mmol/L  CO2 28 20 - 30 mmol/L  Anion Gap 12 7 - 17  Glucose 249 (H) 70 - 100 mg/dL  BUN 21 8 - 23 mg/dL  Creatinine 1.61 0.96 - 1.30 mg/dL  Calcium 9.0 8.8 - 04.5 mg/dL  BUN/Creatinine Ratio 40.9 8.0 - 23.0  Total Protein 7.7 6.6 - 8.7 g/dL  Albumin 4.1 3.6 - 4.9 g/dL  Total Bilirubin 0.4 <=8.1 mg/dL  Alkaline Phosphatase 77 9 - 122 U/L  Alanine Aminotransferase (ALT)  23 9 - 59 U/L  Aspartate Aminotransferase (AST) 27 10 - 35 U/L  Globulin 3.6 (H) 2.3 - 3.5 g/dL  A/G Ratio 1.1 1.0 - 2.2  AST/ALT Ratio 1.2 See Comment  eGFR (Afr Amer) >60 >60 mL/min/1.86m2  eGFR (NON African-American) >60 >60 mL/min/1.37m2 CBC auto differential Result Value Ref Range  WBC 9.9 4.0 - 11.0 x1000/?L  RBC 4.88 4.00 - 6.00 M/?L  Hemoglobin 14.3 13.2 - 17.1 g/dL  Hematocrit 52.84 13.24 - 50.00 %  MCV 88.1 80.0 - 100.0 fL  MCH 29.3 27.0 - 33.0 pg  MCHC 33.3 31.0 - 36.0 g/dL  RDW-CV 40.1 02.7 - 25.3 %  Platelets 294 150 - 420 x1000/?L  MPV 10.3 8.0 - 12.0 fL  Neutrophils 68.1 39.0 - 72.0 %  Lymphocytes 20.5 17.0 - 50.0 %  Monocytes 7.6 4.0 - 12.0 %  Eosinophils 2.6 0.0 - 5.0 % Basophil 0.5 0.0 - 1.4 %  Immature Granulocytes 0.7 0.0 - 1.0 %  nRBC 0.0 0.0 - 1.0 %  ANC(Abs Neutrophil Count) 6.76 2.00 - 7.60 x 1000/?L  Absolute Lymphocyte Count 2.03 0.60 - 3.70 x 1000/?L  Monocyte Absolute Count 0.75 0.00 - 1.00 x 1000/?L  Eosinophil Absolute Count 0.26 0.00 - 1.00 x 1000/?L  Basophil Absolute Count 0.05 0.00 - 1.00 x 1000/?L  Absolute Immature Granulocyte Count 0.07 0.00 - 0.30 x 1000/?L  Absolute nRBC 0.00 0.00 - 1.00 x 1000/?L Phosphorus     (BH GH L YH) Result Value Ref Range  Phosphorus 2.8 2.2 - 4.5 mg/dL PT/INR and PTT (BH GH L LMW YH) Result Value Ref Range  Prothrombin Time 11.0 9.8 - 11.4 seconds  INR 1.04 0.92 - 1.08  PTT 25.7 22.7 - 28.9 seconds IMAGING/PATH:[place] Chest w IV ContrastResult Date: 6/5/2022Study: [place] CHEST W IV CONTRAST  Indication:  Melanoma, monitor Comparison:  [place] from 07/05/2020 Technique: [place] of the chest was performed after intravenous administration of contrast. FINDINGS: Scattered sub-4 mm lung nodules are stable, the largest in the left lower lobe measures 4 mm (image 134, series 4). No new or enlarging lung nodule is identified. There is no thoracic lymphadenopathy or pleural effusion. A prominent left para-aortic node is stable (image 387, series 4). Previously seen growing subcutaneous nodule in the right upper back was resected. No aggressive osseous lesion is seen. For findings below the diaphragms please see a separate Abdomen/Pelvis [place] report.  Resection of subcutaneous nodule in the right upper back, otherwise stable exam. Reported And Signed By: Morene Rankins, MD  [facility] Radiology and Biomedical Imaging MRI Brain with and without IV ContrastResult Date: 6/6/2022MRI BRAIN W WO IV CONTRAST performed on 09/18/2020 1:41 PM INDICATION: Melanoma, staging recent in-transit recurrence in the back s/p resection COMPARISON: April 17, 2019 TECHNIQUE: Multiplanar MR of the brain was performed before and after administration of  24  cc of Dotarem intravenously.  FINDINGS: There is a peripherally enhancing lesion measuring 8 mm involving the left hippocampus with minimal FLAIR hyperintensity in significant susceptibility artifact. Another 6 mm focus of enhancement is also identified in the left mesial temporal lobe. Questioned faint foci of enhancement along the anterior temporal lobes are likely artifactual in nature; attention on follow-up. There is no intracranial hemorrhage or major vascular distribution infarct. There is no mass effect, edema, or midline shift. There are few foci of FLAIR hyperintensity scattered in the subcortical, periventricular white matter and pons, nonspecific, likely related to chronic small vessel ischemic disease. The ventricles are symmetric and normal in size.  No extra-axial collection is seen. The visualized orbits are unremarkable. The paranasal sinuses demonstrate scattered mucosal thickening. The mastoid air cells are clear. Subcentimeter enhancing lesions in the left mesial temporal lobe concerning for metastasis. No significant edema or mass effect. Questioned faint foci of enhancement along the anterior temporal lobes are likely artifactual in nature; attention on follow-up. Reported And Signed By: Vella Raring, MD  [facility] Radiology and Biomedical Imaging [place] Abdomen Pelvis w IV ContrastResult Date: 6/6/2022CT ABDOMEN PELVIS W IV CONTRAST on 09/18/2020 11:53 AM INDICATION: Melanoma, monitor. COMPARISON: [place] dated July 05, 2020. TECHNIQUE:  images of the abdomen and pelvis were obtained after the administration of 50 cc intravenous contrast (Omnipaque 350). FINDINGS: Lung bases: Chest is dictated separately. Hepatobiliary: Liver is unremarkable. Cholelithiasis is present. No biliary ductal dilatation. Pancreas: Unremarkable. Spleen: Unremarkable. Adrenal glands: Unremarkable. Kidneys: Nonobstructing right renal stones are stable. Left cortical calcification associated with the region of parenchymal scarring is unchanged. Bowel: No bowel obstruction. Abdominal and pelvic lymph nodes: No lymphadenopathy. Peritoneum: No ascites. Vasculature: No abdominal aortic aneurysm. Pelvis: No mass. Musculoskeletal system and soft tissue: No aggressive osseous lesion. Fat-containing umbilical hernia again noted. Stable exam, with no new disease identified in the abdomen or pelvis. Reported And Signed By: Liliane Channel, MD  [facility] Radiology and Biomedical Imaging 07/22/20: SOFT TISSUE, RIGHT UPPER BACK, EXCISION: ? ? ? ?- METASTATIC MELANOMA INVOLVING A LYMPH NODE (1/1) ? ? ? ? ? - SIZE OF THE LARGEST TUMOR DEPOSIT: 11 MM ? ? ? ? ? - SIZE OF THE LYMPH NODE: 1.6 CM WLE and SLN 03/03/19:LYMPH NODES, RIGHT AXILLARY LEVELS 1, 2 AND 3, LYMPH NODE DISSECTION: ?  ? ? - ?THREE OF THIRTY-FIVE LYMPH NODES, POSITIVE FOR MELANOMA (3 OF 35) WITH LARGEST INVOLVED LYMPH NODE MEASURING 4.5 X 3 CM ? ? ?- LYMPHATIC INVASION IDENTIFIED ? ? ?- NO EXTRANODAL EXTENSION IDENTIFIED 1. ?SKIN, BACK, RIGHT UPPER, EXCISION: ? ? ? ?- MALIGNANT MELANOMA, SEE NOTE AND SYNOPTIC SUMMARY Note: Sections show a tumor composed of S100 positive epithelioid cells within the dermis and subcutaneous tissue. Cytokeratin AE1/AE3 and desmin are negative. A junctional component is not identified. The findings would be compatible with a primary dermal melanoma or a metastatic lesion. Clinical correlation is suggested. 2. ?SKIN, BACK, EXCISION: ? ? ? ?- BENIGN FIBROADIPOSE TISSUE AND SKELETAL MUSCLE SYNOPTIC SUMMARY MELANOMA OF THE SKIN Tumor Site: ? ? Back Laterality: ? ? Right Procedure: ? ? Primary excision Maximum Tumor Thickness (Depth): ? ? 30 mm Ulceration: ? ? Not identified Mitotic Rate (Mitoses/mm2): ? ? 2 Anatomic Level: ? ? V (Melanoma invades subcutis) Growth Phase: ? ? Vertical Histologic Type: ? ? Melanoma, NOS Tumor-Infiltrating Lymphocytes: ? ? Present, non-brisk Tumor Regression: ? ? Not identifed Lymphovascular Invasion: ? ? Present Microsatellitosis: ? ? Present Margins ? ? ?Peripheral Margins: ? ? Uninvolved Deep Margin: ? ? Uninvolved Stage (AJCC 8th Ed): ? ? pT4a Nx Base of the tongue and vallecula random biopsies 02/20/19: 1. ?TONGUE, LEFT, BASE, BIOPSY: ? ? ? ?- LYMPHOID TISSUE HYPERPLASIA ? ? ?- NEGATIVE FOR MALIGNANCY 2. ?THROAT, VALLECULA, BIOPSY: ? ?  ? - LYMPHOID TISSUE HYPERPLASIA AND FOCAL CHRONIC INFLAMMATION. SEE NOTE. ? ? ?- NEGATIVE FOR MALIGNANCY ? ? ? ? ? Note: Immunostain of Sox-10 to investigate focal pigment is negative, supporting above interpretation. 3. ?THROAT, LEFT VALLECULA, BIOPSY: ? ? ? ?- LYMPHOID TISSUE HYPERPLASIA ? ? ?- NEGATIVE FOR MALIGNANCY 4. ?THROAT, RIGHT VALLECULA, BIOPSY: ? ? ? ? ? ? -  LYMPHOID TISSUE HYPERPLASIA AND FOCAL CHRONIC INFLAMMATION ? ? ?- NEGATIVE FOR MALIGNANCY 5. ?TONGUE, RIGHT BASE, BIOPSY: ? ? ? ? ? ? - LYMPHOID TISSUE HYPERPLASIA AND FOCAL CHRONIC INFLAMMATION ? ? ?- NEGATIVE FOR MALIGNANCY Pathology (reviewed at [facility]), collected 01/07/19:DIAGNOSIS: ? 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 Clinical pathological correlation is recommended. MICROSCOPIC DESCRIPTION: There are atypical cells in a nodule in the dermis. The cells are positive with SOX-10 and by report are negative with LCA, Kappa, Lambda, and a cytokeratin. The cells are also positive with S-100 by report.ASSESSMENT/PLAN:[name] is a 75 y.o. 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  Initially path read as metastatic melanoma, but further workup confirms that this is the primary site, not a metastatic lesion.  Initial biopsy was likely altered due to repeat I&Ds of the area.  No additional primary lesions were identified by dermatology.  PET 02/07/19 showed the main lesion on the right upper back extending to the latissimus dorsi muscle with involved right axillary nodes and nonspecific avidity in the BOT and preepiglottis for which direct visual evaluation was recommended and done 02/11/19 and random biopsies 02/20/19 without concerns for malignancy. Colonoscopy was also done to evaluate stranding in the transverse colon, which was negative for malignancy on exam.  Now s/p WLE and LND with Dr. Duanne Moron on 03/03/19 showing a stage IIIC melanoma that was 30 mm thick, positive for LVI and microsatellites with 3/25 positive LNs.  Given the thickness of his primary melanoma and microsatellitosis, we felt that his melanoma-specific survival is closer to a stage IIID:  60% over 10 years with stage IIIC disease versus 24% with stage IIID.  He opted to proceed with the Moderna clinical trial (?A Phase 2 Randomized Study of Adjuvant Immunotherapy with the Personalized Cancer Vaccine [phone] and Pembrolizumab Versus Pembrolizumab Alone After Complete Resection of High-Risk Melanoma?) and completed 1 year of adjuvant therapy on trial without issues.  He developed an enlarged 1 cm SQ nodule overlying the right scapula noted on scans 07/05/20 s/p excision showing a 1.6 cm LN containing a 11 mm deposit of melanoma.MRI brain now showing a a 8 mm left hippocampus lesion and a 6 mm lesion in the left mesial temporal lobe concerning for brain mets.  Reviewed the case with Dr. Winfred Leeds; lesions are too small and ill-defined to biopsy and would hold off on GKRS for now.  Given high suspicion for metastatic disease, recurrence of his disease recently, and fairly good performance status, would opt to be aggressive.  I reviewed with Jason Rowland that ideally, we would want to get a biopsy to establish stage IV disease, but given that we know his disease is aggressive and presented again shortly after discontinuation of adjuvant anti-PD-1, we can presume that this is metastatic disease and start a new line of systemic therapy.  Discussed the available brain met clinical trials that he is currently on, but he wants to move to [facility] and would like the pursue standard of care. Discussed data from CheckMate 204 utilizing ipi 3 + nivo 1 in brain metastasis which demonstrated similar intracranial and extracranial efficacy.  Jason Rowland agreed to proceed.  SEs reviewed.  Telephone consent obtained 09/30/20.- labs reviewed.  LDH wnl. TSH wnl.  - originally referral to Dr. Winfred Leeds and CNS met TB entered; ok to cancel  as discussed directly with Dr. Winfred Leeds.- routine f/u with dermatologist every 4-6 months.- RTC in 2 weeks in [place] to initiate treatment.  Will get paper version of consent at that time. 30 min were spent in chart review and direct patient contact on the day of the encounter.    Drema Halon, MD, PhDMedical Oncology  Cancer Therapy Consent FormIt is acknowledged that this consent for cancer therapy was obtained remotely by telephone/Video with the patient due to the current National Emergency Covid19 Outbreak. Recommended Treatment Plan: The following cancer treatment plan was recommended for the patient: (Drug name/regimen and Frequency} ipilimumab + nivolumab until there is disease progression/side effectsThe patient was given the opportunity to ask questions and have those questions answered prior to consenting to this treatment plan. The patient verbally confirmed understanding the nature of the treatment and has been informed of the following: risks, complications, benefits or side effects of the treatment, alternatives to the treatment plan including no treatment, and the right to refuse treatment.The patient understands that the treatment may not help him/her.The following side effects were reviewed with the patient:  Fatigue, Heart Effects, Lung Effects, Kidney/Bladder, Pancreas/Liver/Intestine, Muscle/Bone, Nerve Effects, Skin Effects and Allergic ReactionsThe patient was informed that there may be additional side effects not mentioned in our conversation. The patient understands that the side effects may be serious at times and may be life-threatening resulting in hospitalization or death. The patient was informed that there are effective treatments for the most common side effects.The following contraception methods were discussed with the patient; Male patient was informed to use a condom during therapy and 1 month following discontinuation of treatmentThe patient understands that he/she may contact me at any time for questions.The patient verbally agreed to undergo cancer therapy with the regimen described above.The patient was informed that the goal of treatment at this time is control disease progression or palliation of symptoms and improve quality of lifeDate: 09/30/2020 MD signature: Drema Halon, MD

## 2020-09-30 NOTE — Other
START OFF PATHWAY REGIMEN - Melanoma and Other Skin [id number]:Ipilimumab 3 mg/kg IV D1 + Nivolumab 1 mg/kg IV D1 q21 Days (C1-4) Followed by Nivolumab 480 mg IV D1 q28 Days:    Nivolumab (Opdivo)     Ipilimumab (Yervoy)     Nivolumab (Opdivo) **Always confirm dose/schedule in your pharmacy ordering system**Patient Characteristics:Melanoma, Cutaneous/Unknown Primary, Distant Metastases or Unresectable Local Recurrence, Unresectable, Brain Metastases, Second Line, BRAF V600 Wild Type / BRAF V600 Results Pending or UnknownDisease Classification: MelanomaDisease Subtype: CutaneousBRAF V600 Mutation Status: BRAF V600 Wild Type (No Mutation)Therapeutic Status: Distant MetastasesMetastatic Disease Type: Brain MetastasesWould you be surprised if this patient died  in the next year<= I would be surprised if this patient died in the next yearLine of Therapy: Second LineIntent of Therapy:Non-Curative / Palliative Intent, Discussed with Patient

## 2020-10-01 ENCOUNTER — Encounter: Admit: 2020-10-01 | Payer: PRIVATE HEALTH INSURANCE

## 2020-10-05 ENCOUNTER — Ambulatory Visit: Admit: 2020-10-05 | Payer: BLUE CROSS/BLUE SHIELD

## 2020-10-08 ENCOUNTER — Ambulatory Visit: Admit: 2020-10-08 | Payer: BLUE CROSS/BLUE SHIELD

## 2020-10-08 ENCOUNTER — Inpatient Hospital Stay: Admit: 2020-10-08 | Discharge: 2020-10-08 | Payer: BLUE CROSS/BLUE SHIELD

## 2020-10-08 DIAGNOSIS — E1142 Type 2 diabetes mellitus with diabetic polyneuropathy: Secondary | ICD-10-CM

## 2020-10-08 DIAGNOSIS — C7931 Secondary malignant neoplasm of brain: Secondary | ICD-10-CM

## 2020-10-08 DIAGNOSIS — C4359 Malignant melanoma of other part of trunk: Secondary | ICD-10-CM

## 2020-10-08 DIAGNOSIS — C773 Secondary and unspecified malignant neoplasm of axilla and upper limb lymph nodes: Secondary | ICD-10-CM

## 2020-10-08 DIAGNOSIS — Z806 Family history of leukemia: Secondary | ICD-10-CM

## 2020-10-08 DIAGNOSIS — Z8052 Family history of malignant neoplasm of bladder: Secondary | ICD-10-CM

## 2020-10-08 DIAGNOSIS — Z7982 Long term (current) use of aspirin: Secondary | ICD-10-CM

## 2020-10-08 DIAGNOSIS — Z79899 Other long term (current) drug therapy: Secondary | ICD-10-CM

## 2020-10-08 DIAGNOSIS — Z7984 Long term (current) use of oral hypoglycemic drugs: Secondary | ICD-10-CM

## 2020-10-08 DIAGNOSIS — Z5112 Encounter for antineoplastic immunotherapy: Secondary | ICD-10-CM

## 2020-10-08 DIAGNOSIS — E7800 Pure hypercholesterolemia, unspecified: Secondary | ICD-10-CM

## 2020-10-08 DIAGNOSIS — Z8051 Family history of malignant neoplasm of kidney: Secondary | ICD-10-CM

## 2020-10-08 DIAGNOSIS — D485 Neoplasm of uncertain behavior of skin: Secondary | ICD-10-CM

## 2020-10-08 DIAGNOSIS — G939 Disorder of brain, unspecified: Secondary | ICD-10-CM

## 2020-10-08 LAB — CBC WITH AUTO DIFFERENTIAL
BKR ALANINE AMINOTRANSFERASE (ALT): 41.7 % (ref 38.50–50.00)
BKR WAM ABSOLUTE IMMATURE GRANULOCYTES.: 0.02 x 1000/??L (ref 0.00–0.30)
BKR WAM ABSOLUTE LYMPHOCYTE COUNT.: 1.66 x 1000/??L (ref 0.60–3.70)
BKR WAM ABSOLUTE NRBC (2 DEC): 0 x 1000/??L (ref 0.00–1.00)
BKR WAM BASOPHIL ABSOLUTE COUNT.: 0.05 x 1000/??L (ref >60)
BKR WAM BASOPHILS: 0.7 % (ref 0.0–1.4)
BKR WAM EOSINOPHIL ABSOLUTE COUNT.: 0.23 x 1000/ÂµL (ref 0.00–1.00)
BKR WAM EOSINOPHILS: 3 % (ref 0.0–5.0)
BKR WAM HEMATOCRIT (2 DEC): 41.7 % (ref 38.50–50.00)
BKR WAM HEMOGLOBIN: 13.6 g/dL (ref 13.2–17.1)
BKR WAM IMMATURE GRANULOCYTES: 0.3 % (ref 0.0–1.0)
BKR WAM LYMPHOCYTES: 13.6 g/dL (ref 45–117)
BKR WAM LYMPHOCYTES: 21.9 % (ref 17.0–50.0)
BKR WAM MCH (PG): 28.9 pg (ref 27.0–33.0)
BKR WAM MCHC: 32.6 g/dL (ref 31.0–36.0)
BKR WAM MCV: 88.5 fL (ref 80.0–100.0)
BKR WAM MONOCYTE ABSOLUTE COUNT.: 0.63 x 1000/ÂµL (ref 0.00–1.00)
BKR WAM MONOCYTES: 8.3 % (ref 4.0–12.0)
BKR WAM MPV: 10.6 fL (ref 8.0–12.0)
BKR WAM NEUTROPHILS: 65.8 % (ref 39.0–72.0)
BKR WAM NUCLEATED RED BLOOD CELLS: 0 % (ref 0.0–1.0)
BKR WAM PLATELETS: 233 x1000/ÂµL (ref 150–420)
BKR WAM RED BLOOD CELL COUNT.: 4.71 M/??L (ref 4.00–6.00)
BKR WAM WHITE BLOOD CELL COUNT: 7.6 x1000/??L (ref 4.0–11.0)

## 2020-10-08 LAB — COMPREHENSIVE METABOLIC PANEL
BKR A/G RATIO: 1 (ref 1.0–2.2)
BKR ALANINE AMINOTRANSFERASE (ALT): 17 U/L (ref 9–59)
BKR ALBUMIN: 3.8 g/dL (ref 3.6–4.9)
BKR ALKALINE PHOSPHATASE: 69 U/L (ref 9–122)
BKR ANION GAP: 10 (ref 7–17)
BKR ASPARTATE AMINOTRANSFERASE (AST): 18 U/L (ref 10–35)
BKR AST/ALT RATIO: 1.1 x 1000/??L (ref 0.00–1.00)
BKR BILIRUBIN TOTAL: 0.4 mg/dL (ref <=1.2)
BKR BLOOD UREA NITROGEN: 22 mg/dL (ref 8–23)
BKR BUN / CREAT RATIO: 18.8 (ref 8.0–23.0)
BKR CALCIUM: 9 mg/dL (ref 8.8–10.2)
BKR CHLORIDE: 103 mmol/L (ref 98–107)
BKR CO2: 25 mmol/L (ref 20–30)
BKR CREATININE: 1.17 mg/dL (ref 0.40–1.30)
BKR EGFR (AFR AMER): >60 mL/min/1.73m2 (ref >60)
BKR EGFR (NON AFRICAN AMERICAN): >60 mL/min/{1.73_m2} (ref >60)
BKR GLOBULIN: 3.7 g/dL — ABNORMAL HIGH (ref 2.3–3.5)
BKR GLUCOSE: 226 mg/dL — ABNORMAL HIGH (ref 70–100)
BKR POTASSIUM: 4.5 mmol/L (ref 3.3–5.3)
BKR PROTEIN TOTAL: 7.5 g/dL (ref 6.6–8.7)
BKR SODIUM: 138 mmol/L (ref 136–144)
BKR WAM ANALYZER ANC: 3.7 g/dL — ABNORMAL HIGH (ref 2.3–3.5)
BKR WAM MONOCYTE ABSOLUTE COUNT.: 18.8 x 1000/??L (ref 8.0–23.0)

## 2020-10-08 LAB — TSH W/REFLEX TO FT4     (BH GH LMW Q YH): BKR THYROID STIMULATING HORMONE: 3.49 ??IU/mL

## 2020-10-08 MED ORDER — EPINEPHRINE 0.3 MG/0.3 ML INJECTION, AUTO-INJECTOR
0.30.3 mg/ mL | INTRAMUSCULAR | Status: DC | PRN
Start: 2020-10-08 — End: 2020-10-08

## 2020-10-08 MED ORDER — LEVOTHYROXINE 50 MCG TABLET
50 MCG | Freq: Every day | ORAL | Status: AC
Start: 2020-10-08 — End: ?

## 2020-10-08 MED ORDER — ALBUTEROL SULFATE 2.5 MG/3 ML (0.083 %) SOLUTION FOR NEBULIZATION
2.530.083 mg /3 mL (0.083 %) | RESPIRATORY_TRACT | Status: DC | PRN
Start: 2020-10-08 — End: 2020-10-08

## 2020-10-08 MED ORDER — SODIUM CHLORIDE 0.9 % BOLUS (NEW BAG)
0.9 % | Freq: Once | INTRAVENOUS | Status: DC | PRN
Start: 2020-10-08 — End: 2020-10-08

## 2020-10-08 MED ORDER — IPILIMUMAB INFUSION >300 MG
Freq: Once | INTRAVENOUS | Status: CP
Start: 2020-10-08 — End: ?
  Administered 2020-10-08: 16:00:00 250.000 mL/h via INTRAVENOUS

## 2020-10-08 MED ORDER — NIVOLUMAB INFUSION
Freq: Once | INTRAVENOUS | Status: CP
Start: 2020-10-08 — End: ?
  Administered 2020-10-08: 15:00:00 50.000 mL/h via INTRAVENOUS

## 2020-10-08 MED ORDER — MEPERIDINE 25 MG/2.5 ML IN 0.9% SODIUM CHLORIDE
Freq: Once | INTRAVENOUS | Status: DC | PRN
Start: 2020-10-08 — End: 2020-10-08

## 2020-10-08 MED ORDER — DIPHENHYDRAMINE 50 MG/ML INJECTION (WRAPPED E-RX)
50 mg/mL | Freq: Once | INTRAVENOUS | Status: DC | PRN
Start: 2020-10-08 — End: 2020-10-08

## 2020-10-08 MED ORDER — FAMOTIDINE 4 MG/ML IN 0.9% SODIUM CHLORIDE (ADULT)
Freq: Once | INTRAVENOUS | Status: DC | PRN
Start: 2020-10-08 — End: 2020-10-08

## 2020-10-08 MED ORDER — HYDROCORTISONE SODIUM SUCCINATE 100 MG SOLUTION FOR INJECTION
100 mg | Freq: Once | INTRAVENOUS | Status: DC | PRN
Start: 2020-10-08 — End: 2020-10-08

## 2020-10-08 NOTE — Progress Notes
Pt arrives to clinic for D1C1 Ipi/Nivo.Pt states he feels well, offers no concerns, has had immunotherapy in the past.Pt seen and assessed by Dr Laveda Norman, labs drawn and reviewed, pt cleared for treatment.Treatment plan reviewed, signs and symptoms to report to MD, pt verbalizes understanding.After several tries, #22g PIV placed in pt's left FA, Nivo infused over 30 minutes, f/b Ipi over 30 minutes without incident, pt tolerated well.PIV removed, site clear.Pt to RTC in 3 weeks for following treatment day.Pt understands to call Dr Carmela Rima service with any questions/concerns/symptoms.

## 2020-10-11 ENCOUNTER — Telehealth: Admit: 2020-10-11 | Payer: PRIVATE HEALTH INSURANCE

## 2020-10-11 NOTE — Telephone Encounter
Pt returned the call; he is doing very well and was appreciative of the f/u call.

## 2020-10-11 NOTE — Telephone Encounter
Post D1C1 call attempted; no answer, LMOM asking pt to call with any questions/concerns/symptoms.

## 2020-10-12 ENCOUNTER — Telehealth: Admit: 2020-10-12 | Payer: PRIVATE HEALTH INSURANCE

## 2020-10-12 ENCOUNTER — Ambulatory Visit: Admit: 2020-10-12 | Payer: BLUE CROSS/BLUE SHIELD

## 2020-10-12 NOTE — Telephone Encounter
Jason Rowland is aware his biopsy was benign per Dr. Laveda Norman. Electronically Signed by Karle Plumber, RN, October 12, 2020

## 2020-10-15 ENCOUNTER — Telehealth: Admit: 2020-10-15 | Payer: PRIVATE HEALTH INSURANCE

## 2020-10-15 NOTE — Telephone Encounter
Spoke to [employer], and gave him some general information re: Mr Jason Rowland recent appt plan/regiman. Tinnie Gens is unable to make the appointments, however would like to speak to Dr Laveda Norman re: specifics with his care. Will update Dr Laveda Norman Electronically Signed by Bo Mcclintock, APRN, October 15, 2020

## 2020-10-15 NOTE — Telephone Encounter
Spoke to [facility] we can do a phone consult on July 8th with dr tran and andy will be present Electronically Signed by Bo Mcclintock, APRN, October 15, 2020 left message for scheduling to book Electronically Signed by Bo Mcclintock, APRN, October 15, 2020

## 2020-10-15 NOTE — Telephone Encounter
Pt's son Tinnie Gens called requesting to speak to the Dr or nurse to discuss pt's update. Pt has trouble hearing and he doesn't always get the visit information right, particularly with a medication he's currently taking, [phone].

## 2020-10-19 ENCOUNTER — Encounter: Admit: 2020-10-19 | Payer: PRIVATE HEALTH INSURANCE

## 2020-10-19 DIAGNOSIS — C7931 Secondary malignant neoplasm of brain: Secondary | ICD-10-CM

## 2020-10-19 DIAGNOSIS — E119 Type 2 diabetes mellitus without complications: Secondary | ICD-10-CM

## 2020-10-19 DIAGNOSIS — C4359 Malignant melanoma of other part of trunk: Secondary | ICD-10-CM

## 2020-10-19 DIAGNOSIS — E7800 Pure hypercholesterolemia, unspecified: Secondary | ICD-10-CM

## 2020-10-19 DIAGNOSIS — N200 Calculus of kidney: Secondary | ICD-10-CM

## 2020-10-19 NOTE — Progress Notes
NAME:  Toriano Aikey ([birth year])AGE: 75 y.o. MRN:  [id number] PROVIDER: Drema Halon, MD, PhDDATE OF SERVICE: 10/08/2020 [facility] [place]  REASON FOR VISIT:  Initiation of ipi/nivoONC DX:  Invasive melanoma right mid backSTAGE:  Likely IV (pT4a N3 M1d(0) - originally stage IIIC with 30 mm with microsatellitosis, mitotic rate 2 per mm2).  Noted 09/18/20 to have new brain lesions (too small for biopsy or GKRS).MOLECULAR PROFILING:0% tumor and immune cell PD-L1 staining50-gene panel:  DNA VARIANT DETECTED ? ? 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 ? 74% PRIOR TX:  - WLE and SLNB 03/03/19- Adjuvant Moderna clinical trial (?A Phase 2 Randomized Study of Adjuvant Immunotherapy with the Personalized Cancer Vaccine [phone] and Pembrolizumab Versus Pembrolizumab Alone After Complete Resection of High-Risk Melanoma? Protocol [id number], HIC 47829) C1D1 05/08/19. Last dose 04/27/20. Randomized to the pembrolizumab and vaccine arm.- 07/22/20 resection of recurrent LN in the right upper back (1.6 cm LN with 1.1 cm tumor deposit)ACTIVE TX:   ipilimumab (3 mg/kg) + nivolumab (1 mg/kg) - (10/08/20 - )Onc Hx: Mr. Miklas (prefers to be called [name]) is a 75 y.o. male who worked for [employer] (retired at the end of 02/2019 after working there for 50 years) with a PMH of DM2 complicated by peripheral neuropathy, HLD, and AKs who is referred by Joanie Coddington, his dermatologist for a right back metastatic melanoma.  He reports it appeared approx 10 months prior; his PCP originally thought it was a pimple.  Dr. Lorn Junes at [facility] later evaluated it and also thought it was a pimple and injected it with intralesional kenalog x 2 and oral cephalexin without improvement.  The lesion increased in size and burned whenever he leaned against anything.  Denies night sweats and weight loss.  Lucie Leather did a I&D on 01/07/19 with only slight bloody discharge, so a punch biopsy was obtain with path showing metastatic melanoma.  On 01/20/19, additional biopsies were done to try to evaluate a primary site, but we do not have this tissue.  Per patient report, these biopsies were negative. MRI brain 01/30/19 without intracranial disease.  PET done 02/07/19 showing preepiglottic FDG activity s/p ENT evaluation which was negative for any visible malignancy.  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 LDH elevated to 251 at diagnosis. His case was presented at the [facility] Melanoma tumor board on 02/13/19 and consensus was to ask ENT for random biopsies of the BOT and pre-epiglottic PET-avid areas, as we do not want to miss a head and neck cancer but otherwise not felt to be related to his melanoma.  The cervical LN were not thought to be related.  He should also have gastroenterology evaluation for a PET-avid stranding lesion in the transverse colon.  Dr. Landis Gandy (ENT) performed BOT and vallecula random biopsies 02/20/19 which only showed chronic inflammation and no malignancy.  He had a colonoscopy by Dr. Linton Flemings on 02/27/19 showing only a tubular adenoma in the sigmoid colon.  Overall, it was felt that his right mid back lesion is the site of his primary disease, and the metastatic melanoma pathology interpretation may have been complicated due to repeat I&Ds and steroid injections that may have altered the architecture of the primary lesion.  In the absence of any confirmed metastatic disease, he underwent WLE and LND with Dr. Duanne Moron on 03/03/19.  Final path showed a 30 mm thick melanoma, 2 mitoses/mm2, non-brisk TILs, no tumor regression, LVI+, microsatellites+, negative margins, and 3/35 lymph nodes (largest LN  4.5 x 3 cm) were positive for melanoma with presence of lymphatic invasion without extranodal extension.He uses a cane sometimes as he has some balance issues due to neuropathy in his feet.  Otherwise, he is fully functional, active, and independent in ADLs/iADLs.  No hx of autoimmune disease.03/27/19 signed consent for the Moderna trial; randomized to pembro and vaccine arm.  Started treatment C1D1 05/08/19.04/24/19 noted to have wound dehiscence, non-infected, healing by secondary intent.  Re-instated VNA services to assist with dressing changes and packing.  Wound completely healed by 06/20/19.10/27/19, 01/12/20, and 04/24/20 surveillance scans NED.07/05/20 - [place] chest showing an increased 1.0 cm subcutaneous nodule in the right upper back.  Bx 07/06/20 not diagnostic.  Excision by Dr. Duanne Moron 07/22/20 showed metastatic melanoma involving 1 LN with 11 mm tumor deposit in a 1.6 cm LN.   09/18/20 - MRI brain showing a 8 mm left hippocampus lesion and a 6 mm lesion in the left mesial temporal lobe.  Reports new subtle memory issues.Interval ZO:XWRU returns for follow-up today and initiation of ipi/nivo.  He hasn't noticed any memory changes since he was was seen.  He has ongoing fatigue and insomnia.  Denies any fevers, N/V, or diarrhea.  ECOG:  1Pain: 0/10Review of Systems:No fevers, chills, night sweats, lightheadedness, HA, CP, palpitations, wheezing, abd pain, constipation, blood in the urine or stools, dysuria.  Diabetic neuropathy in the hands and feet stable.  He has a prior hx of intermittent loose stools, chronic, without blood depending on his diet (often isolated to a specific restaurant he likes to visit with his buddies).  Positive responses on ROS as noted in interval hx. PAST MEDICAL HISTORY:Past Medical History: Diagnosis Date ? Diabetes mellitus (HC Code) (HC CODE) (HC Code)  ? High cholesterol  ? Kidney stones   40 years ago ? 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 Malignant melanoma of torso excluding breast (HC Code)  SURGICAL HISTORY:Past Surgical History: Procedure Laterality Date ? COLONOSCOPY   ? CORONARY ANGIOPLASTY WITH STENT PLACEMENT Bilateral   patient denies ? laryngoscopy with biopsy  02/20/2019 ? ROTATOR CUFF REPAIR Left 1999 ? TONSILLECTOMY    age 89 ? TOTAL HIP ARTHROPLASTY Right 2002 FAMILY HISTORY:Family History Problem Relation Age of Onset ? Heart disease Mother  ? Bladder cancer Father 60 ? Kidney cancer Sister  ? Leukemia Brother 18 ? No Known Problems Daughter  ? No Known Problems Son  SOCIAL HISTORY:Social History Socioeconomic History ? Marital status: Divorced   Spouse name: Not on file ? Number of children: Not on file ? Years of education: Not on file ? Highest education level: Not on file Occupational History ? Not on file Tobacco Use ? Smoking status: Never Smoker ? Smokeless tobacco: Never Used Vaping Use ? Vaping Use: Never used Substance and Sexual Activity ? Alcohol use: Not Currently ? Drug use: No ? Sexual activity: Not on file Other Topics Concern ? Not on file Social History Narrative  Divorced, works for [employer] in [place] will be retiring 03/17/19.  Has 1 son in [place] and 1 daughter in [place]. Llives in [place]  Social Determinants of Health Financial Resource Strain: Not on file Food Insecurity: Not on file Transportation Needs: Not on file Physical Activity: Not on file Stress: Not on file Social Connections: Not on file Intimate Partner Violence: Not on file Housing Stability: Not on file ALLERGIES:No Known AllergiesMEDICATIONS:Current Outpatient Medications Medication Sig ? acetaminophen (  TYLENOL) 325 mg tablet Take 3 tablets (975 mg total) by mouth every 6 (six) hours as needed (mild to moderate pain). (Patient not taking: Reported on 04/27/2020) ? ALPRAZolam (XANAX) 0.5 mg tablet 0.5 mg.  (Patient not taking: Reported on 04/06/2020) ? aspirin 81 MG EC tablet Take 81 mg by mouth nightly.  ? docusate sodium (COLACE) 250 mg capsule Take 1 capsule (250 mg total) by mouth 2 (two) times daily as needed for constipation. (Patient not taking: Reported on 05/25/2020) ? FREESTYLE LIBRE 14 DAY sensor kit 1 each by Other route every 14 (fourteen) days. ? glipiZIDE (GLUCOTROL XL) 10 MG 24 hr tablet Take 10 mg by mouth nightly.  ? ibuprofen (ADVIL,MOTRIN) 200 mg tablet Take 2 tablets (400 mg total) by mouth every 6 (six) hours as needed (alternate with tylenol for mild to moderate pain). ? indomethacin (INDOCIN) 25 mg capsule 3 (three) times daily. (Patient not taking: Reported on 07/22/2020) ? levothyroxine (SYNTHROID, LEVOTHROID) 50 MCG tablet Take 50 mcg by mouth daily. ? olopatadine (PATANOL) 0.1 % ophthalmic solution as needed (takes for eye irritation as needed).  (Patient not taking: Reported on 04/06/2020) ? simvastatin (ZOCOR) 20 MG tablet Take 20 mg by mouth nightly.  ? TOUJEO SOLOSTAR U-300 300 unit/mL (1.5 mL) pen Inject 30 Units under the skin nightly. ? triamcinolone (KENALOG) 0.1 % ointment Apply topically 2 (two) times daily as needed. To areas of rash. ? vit A/vit C/vit E/zinc/copper (PRESERVISION AREDS ORAL) Take by mouth 2 (two) times daily. (Patient not taking: Reported on 07/06/2020) ? zolpidem (AMBIEN) 10 mg tablet nightly.. No current facility-administered medications for this visit. VITALS: BP (!) 148/90  - Pulse 80  - Temp 97.9 ?F (36.6 ?C) (Temporal)  - Resp 18  - Wt 120.3 kg  - SpO2 97%  - BMI 37.53 kg/m?  PHYSICAL EXAM:Gen:  NAD, pleasant HEENT:  EOMI, PEARLLN:  No cervical, supraclavicular, axillary, or inguinal LAD. CV:  No murmurs, mild tachy, no m/r/g Pulm:  CTA b/lAbd:  Soft, no tenderness, no guarding or rebound, no organomegaly, no ascitesExt:  WWP, 1+ symmetric edema in the ankles.  Lymphedema present in the right arm, unchanged.Neuro:   No focal deficitsPsych:  Normal mood and affectSkin:  Scar on the right upper back well healed without evidence of recurrence. Small lateral skin puckering at the edges of the scar stable.  Flaking skin/dryness in the middle portion of the scar.  Right axillary scar well healed.  Scar from LN recurrence well healed.LABS:Results for orders placed or performed in visit on 10/08/20 TSH w/reflex to FT4     (BH GH LMW Q YH) Result Value Ref Range  Thyroid Stimulating Hormone 3.490 See Comment ?IU/mL CBC auto differential Result Value Ref Range  WBC 7.6 4.0 - 11.0 x1000/?L  RBC 4.71 4.00 - 6.00 M/?L  Hemoglobin 13.6 13.2 - 17.1 g/dL  Hematocrit 16.10 96.04 - 50.00 %  MCV 88.5 80.0 - 100.0 fL  MCH 28.9 27.0 - 33.0 pg  MCHC 32.6 31.0 - 36.0 g/dL  RDW-CV 54.0 98.1 - 19.1 %  Platelets 233 150 - 420 x1000/?L  MPV 10.6 8.0 - 12.0 fL  Neutrophils 65.8 39.0 - 72.0 %  Lymphocytes 21.9 17.0 - 50.0 %  Monocytes 8.3 4.0 - 12.0 %  Eosinophils 3.0 0.0 - 5.0 %  Basophil 0.7 0.0 - 1.4 %  Immature Granulocytes 0.3 0.0 - 1.0 %  nRBC 0.0 0.0 - 1.0 %  ANC(Abs Neutrophil Count) 5.00 2.00 - 7.60 x 1000/?L  Absolute  Lymphocyte Count 1.66 0.60 - 3.70 x 1000/?L  Monocyte Absolute Count 0.63 0.00 - 1.00 x 1000/?L  Eosinophil Absolute Count 0.23 0.00 - 1.00 x 1000/?L  Basophil Absolute Count 0.05 0.00 - 1.00 x 1000/?L  Absolute Immature Granulocyte Count 0.02 0.00 - 0.30 x 1000/?L  Absolute nRBC 0.00 0.00 - 1.00 x 1000/?L Comprehensive metabolic panel Result Value Ref Range  Sodium 138 136 - 144 mmol/L  Potassium 4.5 3.3 - 5.3 mmol/L  Chloride 103 98 - 107 mmol/L  CO2 25 20 - 30 mmol/L  Anion Gap 10 7 - 17  Glucose 226 (H) 70 - 100 mg/dL  BUN 22 8 - 23 mg/dL  Creatinine 1.61 0.96 - 1.30 mg/dL  Calcium 9.0 8.8 - 04.5 mg/dL  BUN/Creatinine Ratio 40.9 8.0 - 23.0  Total Protein 7.5 6.6 - 8.7 g/dL  Albumin 3.8 3.6 - 4.9 g/dL  Total Bilirubin 0.4 <=8.1 mg/dL  Alkaline Phosphatase 69 9 - 122 U/L  Alanine Aminotransferase (ALT) 17 9 - 59 U/L  Aspartate Aminotransferase (AST) 18 10 - 35 U/L  Globulin 3.7 (H) 2.3 - 3.5 g/dL  A/G Ratio 1.0 1.0 - 2.2  AST/ALT Ratio 1.1 See Comment  eGFR (Afr Amer) >60 >60 mL/min/1.66m2  eGFR (NON African-American) >60 >60 mL/min/1.55m2 DERMPATH BIOPSY     (YMG) Result Value Ref Range  Dermpath Biopsy Results     [facility]. Sharin Mons [id number], [place], [place] [address]AOZHYQMVH: [phone]  Fax: [phone] Clarks: [phone] Epic MR#: [id number] D.O.B.: 1947-11-18Physician: Drema Halon, M.D.Copy LO:VFIEPPIRJ:   LEFT MID UPPER BACK  SEBORRHEIC KERATOSIS, IRRITATED AND INFLAMEDCLINICAL IMPRESSION PROVIDED TO LABORATORY: R/O SCCGROSS DESCRIPTION:12x12x3 mm trisectedMICROSCOPIC DESCRIPTION:Sections show acanthosis, papillomatosis, hyperkeratosis, and aninflammatory infiltrate.Final Diagnosis performed by Tama Headings M.D. Electronically signed6/27/2022 3:14PM IMAGING/PATH:No results found. 07/22/20: SOFT TISSUE, RIGHT UPPER BACK, EXCISION: ? ? ? ?- METASTATIC MELANOMA INVOLVING A LYMPH NODE (1/1) ? ? ? ? ? - SIZE OF THE LARGEST TUMOR DEPOSIT: 11 MM ? ? ? ? ? - SIZE OF THE LYMPH NODE: 1.6 CM WLE and SLN 03/03/19:LYMPH NODES, RIGHT AXILLARY LEVELS 1, 2 AND 3, LYMPH NODE DISSECTION: ?  ? ? - ?THREE OF THIRTY-FIVE LYMPH NODES, POSITIVE FOR MELANOMA (3 OF 35) WITH LARGEST INVOLVED LYMPH NODE MEASURING 4.5 X 3 CM ? ? ?- LYMPHATIC INVASION IDENTIFIED ? ? ?- NO EXTRANODAL EXTENSION IDENTIFIED 1. ?SKIN, BACK, RIGHT UPPER, EXCISION: ? ? ? ?- MALIGNANT MELANOMA, SEE NOTE AND SYNOPTIC SUMMARY Note: Sections show a tumor composed of S100 positive epithelioid cells within the dermis and subcutaneous tissue. Cytokeratin AE1/AE3 and desmin are negative. A junctional component is not identified. The findings would be compatible with a primary dermal melanoma or a metastatic lesion. Clinical correlation is suggested. 2. ?SKIN, BACK, EXCISION: ? ? ? ?- BENIGN FIBROADIPOSE TISSUE AND SKELETAL MUSCLE SYNOPTIC SUMMARY MELANOMA OF THE SKIN Tumor Site: ? ? Back Laterality: ? ? Right Procedure: ? ? Primary excision Maximum Tumor Thickness (Depth): ? ? 30 mm Ulceration: ? ? Not identified Mitotic Rate (Mitoses/mm2): ? ? 2 Anatomic Level: ? ? V (Melanoma invades subcutis) Growth Phase: ? ? Vertical Histologic Type: ? ? Melanoma, NOS Tumor-Infiltrating Lymphocytes: ? ? Present, non-brisk Tumor Regression: ? ? Not identifed Lymphovascular Invasion: ? ? Present Microsatellitosis: ? ? Present Margins ? ? ?Peripheral Margins: ? ? Uninvolved Deep Margin: ? ? Uninvolved Stage (AJCC 8th Ed): ? ? pT4a Nx Base of the tongue and vallecula random biopsies 02/20/19: 1. ?TONGUE, LEFT, BASE, BIOPSY: ? ? ? ?- LYMPHOID TISSUE  HYPERPLASIA ? ? ?- NEGATIVE FOR MALIGNANCY 2. ?THROAT, VALLECULA, BIOPSY: ? ?  ? - LYMPHOID TISSUE HYPERPLASIA AND FOCAL CHRONIC INFLAMMATION. SEE NOTE. ? ? ?- NEGATIVE FOR MALIGNANCY ? ? ? ? ? Note: Immunostain of Sox-10 to investigate focal pigment is negative, supporting above interpretation. 3. ?THROAT, LEFT VALLECULA, BIOPSY: ? ? ? ?- LYMPHOID TISSUE HYPERPLASIA ? ? ?- NEGATIVE FOR MALIGNANCY 4. ?THROAT, RIGHT VALLECULA, BIOPSY: ? ? ? ? ? ? - LYMPHOID TISSUE HYPERPLASIA AND FOCAL CHRONIC INFLAMMATION ? ? ?- NEGATIVE FOR MALIGNANCY 5. ?TONGUE, RIGHT BASE, BIOPSY: ? ? ? ? ? ? - LYMPHOID TISSUE HYPERPLASIA AND FOCAL CHRONIC INFLAMMATION ? ? ?- NEGATIVE FOR MALIGNANCY Pathology (reviewed at [facility]), collected 01/07/19:DIAGNOSIS: ? 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 Clinical pathological correlation is recommended. MICROSCOPIC DESCRIPTION: There are atypical cells in a nodule in the dermis. The cells are positive with SOX-10 and by report are negative with LCA, Kappa, Lambda, and a cytokeratin. The cells are also positive with S-100 by report.ASSESSMENT/PLAN:[name] is a 75 y.o. 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 Duanne Moron on 03/03/19 showing a stage IIIC melanoma that was 30 mm thick, positive for LVI and microsatellites with 3/25 positive LNs.  Given the thickness of his primary melanoma and microsatellitosis, we felt that his melanoma-specific survival is closer to a stage IIID:  60% over 10 years with stage IIIC disease versus 24% with stage IIID.  He opted to proceed with the Moderna clinical trial (?A Phase 2 Randomized Study of Adjuvant Immunotherapy with the Personalized Cancer Vaccine [phone] and Pembrolizumab Versus Pembrolizumab Alone After Complete Resection of High-Risk Melanoma?) and completed 1 year of adjuvant therapy on trial without issues.  He developed an enlarged 1 cm SQ nodule overlying the right scapula noted on scans 07/05/20 s/p excision showing a 1.6 cm LN containing a 11 mm deposit of melanoma.MRI brain 09/18/20 showed a 8 mm left hippocampus lesion and a 6 mm lesion in the left mesial temporal lobe concerning for brain mets.  Reviewed the case with Dr. Winfred Leeds; lesions are too small and ill-defined to biopsy and would hold off on GKRS for now.  Given high suspicion for metastatic disease, recurrence of his disease recently, and fairly good performance status, would opt to be aggressive.  I reviewed with [name] that ideally, we would want to get a biopsy to establish stage IV disease, but given that we know his disease is aggressive and presented again shortly after discontinuation of adjuvant anti-PD-1, we can presume that this is metastatic disease and start a new line of systemic therapy.  Discussed the available brain met clinical trials that he is currently on, but he wants to move to [facility] and would like the pursue standard of care. Discussed data from Check[employer]ate 204 utilizing ipi 3 + nivo 1 in brain metastasis which demonstrated similar intracranial and extracranial efficacy.  [name] agreed to proceed.  SEs again reviewed.  Paper consent signed today.  - labs reviewed and unremarkable. TSH wnl.  - routine f/u with dermatologist every 4-6 months.- RTC in 3 weeks for next cycle. 30 min were spent in chart review and direct patient contact on the day of the encounter.    Drema Halon, MD, PhDMedical Oncology

## 2020-10-22 ENCOUNTER — Encounter: Admit: 2020-10-22 | Payer: PRIVATE HEALTH INSURANCE

## 2020-10-22 ENCOUNTER — Ambulatory Visit: Admit: 2020-10-22 | Payer: BLUE CROSS/BLUE SHIELD

## 2020-10-22 DIAGNOSIS — E7800 Pure hypercholesterolemia, unspecified: Secondary | ICD-10-CM

## 2020-10-22 DIAGNOSIS — C4359 Malignant melanoma of other part of trunk: Secondary | ICD-10-CM

## 2020-10-22 DIAGNOSIS — E119 Type 2 diabetes mellitus without complications: Secondary | ICD-10-CM

## 2020-10-22 DIAGNOSIS — C7931 Secondary malignant neoplasm of brain: Secondary | ICD-10-CM

## 2020-10-22 DIAGNOSIS — N200 Calculus of kidney: Secondary | ICD-10-CM

## 2020-10-22 NOTE — Progress Notes
TELEPHONE VISIT: For this visit the clinician and patient were present via telephone (audio only).Patient counseled on available options for visit type; Patient elected telephone visit; Patient consent given for telephone visit: YesPatient Identity was confirmed during this call.  Other individuals actively participating in the telephone encounter and their name/relation to the patient: son, JeffreyTotal time spent in medical telephone consultation: 7 minBecause this visit was completed over telephone, a hands-on physical exam was not performed.  Patient understands and knows to call back if condition changes. The visit type for this patient required modifications due to the COVID-19 outbreak.NAME:  Jason Rowland ([birth year])AGE: 75 y.o. MRN:  [id number] PROVIDER: Drema Halon, MD, PhDDATE OF SERVICE: 10/22/2020 [facility] [place]  REASON FOR VISIT:  prognosisONC DX:  Invasive melanoma right mid backSTAGE:  Likely IV (pT4a N3 M1d(0) - originally stage IIIC with 30 mm with microsatellitosis, mitotic rate 2 per mm2).  Noted 09/18/20 to have new brain lesions (too small for biopsy or GKRS).MOLECULAR PROFILING:0% tumor and immune cell PD-L1 staining50-gene panel:  DNA VARIANT DETECTED ? ? 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 ? 74% PRIOR TX:  - WLE and SLNB 03/03/19- Adjuvant Moderna clinical trial (?A Phase 2 Randomized Study of Adjuvant Immunotherapy with the Personalized Cancer Vaccine [phone] and Pembrolizumab Versus Pembrolizumab Alone After Complete Resection of High-Risk Melanoma? Protocol [id number], HIC 10272) C1D1 05/08/19. Last dose 04/27/20. Randomized to the pembrolizumab and vaccine arm.- 07/22/20 resection of recurrent LN in the right upper back (1.6 cm LN with 1.1 cm tumor deposit)ACTIVE TX:   ipilimumab (3 mg/kg) + nivolumab (1 mg/kg) - (10/08/20 - )Onc Hx: Jason Rowland (prefers to be called Jason Rowland) is a 75 y.o. male who worked for [employer] (retired at the end of 02/2019 after working there for 50 years) with a PMH of DM2 complicated by peripheral neuropathy, HLD, and AKs who is referred by Joanie Coddington, his dermatologist for a right back metastatic melanoma.  He reports it appeared approx 10 months prior; his PCP originally thought it was a pimple.  Dr. Lorn Junes at [facility] later evaluated it and also thought it was a pimple and injected it with intralesional kenalog x 2 and oral cephalexin without improvement.  The lesion increased in size and burned whenever he leaned against anything.  Denies night sweats and weight loss.  Lucie Leather did a I&D on 01/07/19  with only slight bloody discharge, so a punch biopsy was obtain with path showing metastatic melanoma.  On 01/20/19, additional biopsies were done to try to evaluate a primary site, but we do not have this tissue.  Per patient report, these biopsies were negative. MRI brain 01/30/19 without intracranial disease.  PET done 02/07/19 showing preepiglottic FDG activity s/p ENT evaluation which was negative for any visible malignancy.  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 LDH elevated to 251 at diagnosis. His case was presented at the [facility] tumor board on 02/13/19 and consensus was to ask ENT for random biopsies of the BOT and pre-epiglottic PET-avid areas, as we do not want to miss a head and neck cancer but otherwise not felt to be related to his melanoma.  The cervical LN were not thought to be related.  He should also have gastroenterology evaluation for a PET-avid stranding lesion in the transverse colon.  Dr. Landis Gandy (ENT) performed BOT and vallecula random biopsies 02/20/19 which only showed chronic inflammation and no malignancy.  He had a colonoscopy by Dr. Linton Flemings on 02/27/19  showing only a tubular adenoma in the sigmoid colon.  Overall, it was felt that his right mid back lesion is the site of his primary disease, and the metastatic melanoma pathology interpretation may have been complicated due to repeat I&Ds and steroid injections that may have altered the architecture of the primary lesion.  In the absence of any confirmed metastatic disease, he underwent WLE and LND with Dr. Duanne Moron on 03/03/19.  Final path showed a 30 mm thick melanoma, 2 mitoses/mm2, non-brisk TILs, no tumor regression, LVI+, microsatellites+, negative margins, and 3/35 lymph nodes (largest LN 4.5 x 3 cm) were positive for melanoma with presence of lymphatic invasion without extranodal extension.He uses a cane sometimes as he has some balance issues due to neuropathy in his feet.  Otherwise, he is fully functional, active, and independent in ADLs/iADLs.  No hx of autoimmune disease.03/27/19 signed consent for the Moderna trial; randomized to pembro and vaccine arm.  Started treatment C1D1 05/08/19.04/24/19 noted to have wound dehiscence, non-infected, healing by secondary intent.  Re-instated VNA services to assist with dressing changes and packing.  Wound completely healed by 06/20/19.10/27/19, 01/12/20, and 04/24/20 surveillance scans NED.07/05/20 - [place] chest showing an increased 1.0 cm subcutaneous nodule in the right upper back.  Bx 07/06/20 not diagnostic.  Excision by Dr. Duanne Moron 07/22/20 showed metastatic melanoma involving 1 LN with 11 mm tumor deposit in a 1.6 cm LN.   09/18/20 - MRI brain showing a 8 mm left hippocampus lesion and a 6 mm lesion in the left mesial temporal lobe.  Reports new subtle memory issues.  Reviewed with Dr. Winfred Leeds who felt lesions were too small for GKRS or bx.10/08/20 - C1 ipi 3mg /kg + nivo 1 mg/kgInterval ZO:XWRU'E son Tinnie Gens requested a joint visit to discuss his father's prognosis.  Jason Rowland was unable to answer some of his children's questions regarding his treatment and care, and they were looking for clarification.Jason Rowland has been feeling well since his initial dose of ipi/nivo.  He has some increased watering in his left eye without blurriness, but he has an appt with his ophthalmologist for further evaluation.  No diarrhea.ECOG:  1Pain: 0/10Review of Systems:No fevers, chills, night sweats, lightheadedness, HA, CP, palpitations, wheezing, abd pain, constipation, blood in the urine or stools, dysuria.  Diabetic neuropathy in the hands and feet stable.  He has a prior hx of intermittent loose stools, chronic, without blood depending on his diet (often isolated to a specific restaurant he likes to visit with his buddies).  Positive responses on ROS as noted in interval hx. PAST MEDICAL HISTORY:Past Medical History: Diagnosis Date ? Diabetes mellitus (HC Code) (HC CODE) (HC Code)  ? High cholesterol  ? Kidney stones   40 years ago ? 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 Malignant melanoma of torso excluding breast (HC Code)  SURGICAL HISTORY:Past Surgical History: Procedure Laterality Date ? COLONOSCOPY   ? CORONARY ANGIOPLASTY WITH STENT PLACEMENT Bilateral   patient denies ? laryngoscopy with biopsy  02/20/2019 ? ROTATOR CUFF REPAIR Left 1999 ? TONSILLECTOMY    age 3 ? TOTAL HIP ARTHROPLASTY Right 2002 FAMILY HISTORY:Family History Problem Relation Age of Onset ? Heart disease Mother  ? Bladder cancer Father 75 ? Kidney cancer Sister  ? Leukemia Brother 26 ? No Known Problems Daughter  ? No Known Problems Son  SOCIAL HISTORY:Social History Socioeconomic History ? Marital status: Divorced   Spouse name: Not on file ?  Number of children: Not on file ? Years of education: Not on file ? Highest education level: Not on file Occupational History ? Not on file Tobacco Use ? Smoking status: Never Smoker ? Smokeless tobacco: Never Used Vaping Use ? Vaping Use: Never used Substance and Sexual Activity ? Alcohol use: Not Currently ? Drug use: No ? Sexual activity: Not on file Other Topics Concern ? Not on file Social History Narrative  Divorced, works for [employer] in [place] will be retiring 03/17/19.  Has 1 son in [place] and 1 daughter in [place]. Llives in [place]  Social Determinants of Health Financial Resource Strain: Not on file Food Insecurity: Not on file Transportation Needs: Not on file Physical Activity: Not on file Stress: Not on file Social Connections: Not on file Intimate Partner Violence: Not on file Housing Stability: Not on file ALLERGIES:No Known AllergiesMEDICATIONS:Current Outpatient Medications Medication Sig ? acetaminophen (TYLENOL) 325 mg tablet Take 3 tablets (975 mg total) by mouth every 6 (six) hours as needed (mild to moderate pain). (Patient not taking: Reported on 04/27/2020) ? ALPRAZolam (XANAX) 0.5 mg tablet 0.5 mg.  (Patient not taking: Reported on 04/06/2020) ? aspirin 81 MG EC tablet Take 81 mg by mouth nightly.  ? docusate sodium (COLACE) 250 mg capsule Take 1 capsule (250 mg total) by mouth 2 (two) times daily as needed for constipation. (Patient not taking: Reported on 05/25/2020) ? FREESTYLE LIBRE 14 DAY sensor kit 1 each by Other route every 14 (fourteen) days. ? glipiZIDE (GLUCOTROL XL) 10 MG 24 hr tablet Take 10 mg by mouth nightly.  ? ibuprofen (ADVIL,MOTRIN) 200 mg tablet Take 2 tablets (400 mg total) by mouth every 6 (six) hours as needed (alternate with tylenol for mild to moderate pain). ? indomethacin (INDOCIN) 25 mg capsule 3 (three) times daily. (Patient not taking: Reported on 07/22/2020) ? levothyroxine (SYNTHROID, LEVOTHROID) 50 MCG tablet Take 50 mcg by mouth daily. ? olopatadine (PATANOL) 0.1 % ophthalmic solution as needed (takes for eye irritation as needed).  (Patient not taking: Reported on 04/06/2020) ? simvastatin (ZOCOR) 20 MG tablet Take 20 mg by mouth nightly.  ? TOUJEO SOLOSTAR U-300 300 unit/mL (1.5 mL) pen Inject 30 Units under the skin nightly. ? triamcinolone (KENALOG) 0.1 % ointment Apply topically 2 (two) times daily as needed. To areas of rash. ? vit A/vit C/vit E/zinc/copper (PRESERVISION AREDS ORAL) Take by mouth 2 (two) times daily. (Patient not taking: Reported on 07/06/2020) ? zolpidem (AMBIEN) 10 mg tablet nightly.. No current facility-administered medications for this visit. VITALS: There were no vitals taken for this visit. PHYSICAL EXAM:No exam done as this was a telephone visit.LABS:No labs doneIMAGING/PATH:No results found. 07/22/20: SOFT TISSUE, RIGHT UPPER BACK, EXCISION: ? ? ? ?- METASTATIC MELANOMA INVOLVING A LYMPH NODE (1/1) ? ? ? ? ? - SIZE OF THE LARGEST TUMOR DEPOSIT: 11 MM ? ? ? ? ? - SIZE OF THE LYMPH NODE: 1.6 CM WLE and SLN 03/03/19:LYMPH NODES, RIGHT AXILLARY LEVELS 1, 2 AND 3, LYMPH NODE DISSECTION: ?  ? ? - ?THREE OF THIRTY-FIVE LYMPH NODES, POSITIVE FOR MELANOMA (3 OF 35) WITH LARGEST INVOLVED LYMPH NODE MEASURING 4.5 X 3 CM ? ? ?- LYMPHATIC INVASION IDENTIFIED ? ? ?- NO EXTRANODAL EXTENSION IDENTIFIED 1. ?SKIN, BACK, RIGHT UPPER, EXCISION: ? ? ? ?- MALIGNANT MELANOMA, SEE NOTE AND SYNOPTIC SUMMARY Note: Sections show a tumor composed of S100 positive epithelioid cells within the dermis and subcutaneous tissue. Cytokeratin AE1/AE3 and desmin are negative. A junctional component is not  identified. The findings would be compatible with a primary dermal melanoma or a metastatic lesion. Clinical correlation is suggested. 2. ?SKIN, BACK, EXCISION: ? ? ? ?- BENIGN FIBROADIPOSE TISSUE AND SKELETAL MUSCLE SYNOPTIC SUMMARY MELANOMA OF THE SKIN Tumor Site: ? ? Back Laterality: ? ? Right Procedure: ? ? Primary excision Maximum Tumor Thickness (Depth): ? ? 30 mm Ulceration: ? ? Not identified Mitotic Rate (Mitoses/mm2): ? ? 2 Anatomic Level: ? ? V (Melanoma invades subcutis) Growth Phase: ? ? Vertical Histologic Type: ? ? Melanoma, NOS Tumor-Infiltrating Lymphocytes: ? ? Present, non-brisk Tumor Regression: ? ? Not identifed Lymphovascular Invasion: ? ? Present Microsatellitosis: ? ? Present Margins ? ? ?Peripheral Margins: ? ? Uninvolved Deep Margin: ? ? Uninvolved Stage (AJCC 8th Ed): ? ? pT4a Nx Base of the tongue and vallecula random biopsies 02/20/19: 1. ?TONGUE, LEFT, BASE, BIOPSY: ? ? ? ?- LYMPHOID TISSUE HYPERPLASIA ? ? ?- NEGATIVE FOR MALIGNANCY 2. ?THROAT, VALLECULA, BIOPSY: ? ?  ? - LYMPHOID TISSUE HYPERPLASIA AND FOCAL CHRONIC INFLAMMATION. SEE NOTE. ? ? ?- NEGATIVE FOR MALIGNANCY ? ? ? ? ? Note: Immunostain of Sox-10 to investigate focal pigment is negative, supporting above interpretation. 3. ?THROAT, LEFT VALLECULA, BIOPSY: ? ? ? ?- LYMPHOID TISSUE HYPERPLASIA ? ? ?- NEGATIVE FOR MALIGNANCY 4. ?THROAT, RIGHT VALLECULA, BIOPSY: ? ? ? ? ? ? - LYMPHOID TISSUE HYPERPLASIA AND FOCAL CHRONIC INFLAMMATION ? ? ?- NEGATIVE FOR MALIGNANCY 5. ?TONGUE, RIGHT BASE, BIOPSY: ? ? ? ? ? ? - LYMPHOID TISSUE HYPERPLASIA AND FOCAL CHRONIC INFLAMMATION ? ? ?- NEGATIVE FOR MALIGNANCY Pathology (reviewed at [facility]), collected 01/07/19:DIAGNOSIS: ? 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 Clinical pathological correlation is recommended. MICROSCOPIC DESCRIPTION: There are atypical cells in a nodule in the dermis. The cells are positive with SOX-10 and by report are negative with LCA, Kappa, Lambda, and a cytokeratin. The cells are also positive with S-100 by report.ASSESSMENT/PLAN:[name] is a 75 y.o. 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 Duanne Moron on 03/03/19 showing a stage IIIC melanoma that was 30 mm thick, positive for LVI and microsatellites with 3/25 positive LNs.  Given the thickness of his primary melanoma and microsatellitosis, we felt that his melanoma-specific survival is closer to a stage IIID:  60% over 10 years with stage IIIC disease versus 24% with stage IIID.  He opted to proceed with the Moderna clinical trial (?A Phase 2 Randomized Study of Adjuvant Immunotherapy with the Personalized Cancer Vaccine [phone] and Pembrolizumab Versus Pembrolizumab Alone After Complete Resection of High-Risk Melanoma?) and completed 1 year of adjuvant therapy on trial without issues.  He developed an enlarged 1 cm SQ nodule overlying the right scapula noted on scans 07/05/20 s/p excision showing a 1.6 cm LN containing a 11 mm deposit of melanoma.MRI brain 09/18/20 showed a 8 mm left hippocampus lesion and a 6 mm lesion in the left mesial temporal lobe concerning for brain mets.  Reviewed the case with Dr. Winfred Leeds; lesions are too small and ill-defined to biopsy and would hold off on GKRS for now.  Given high suspicion for metastatic disease, recurrence of his disease recently, and fairly good performance status, would opt to be aggressive.  I reviewed with Jason Rowland that ideally, we would want to get a biopsy to establish stage IV  disease, but given that we know his disease is aggressive and presented again shortly after discontinuation of adjuvant anti-PD-1, we can presume that this is metastatic disease and start a new line of systemic therapy.  Discussed the available brain met clinical trials that he is currently on, but he wants to move to [facility] and would like the pursue standard of care. Discussed data from [facility] utilizing ipi 3 + nivo 1 in brain metastasis which demonstrated similar intracranial and extracranial efficacy; C1 10/08/20.Reviewed disease course, treatment, and prognosis with Jason Rowland and his son, Tinnie Gens.  Discussed that the ipi/nivo regimen he is currently on is standard of care, so he can continue treatment in [place] once he establishes care with a medical oncologist there.  Even though the [facility] [address] is about 90 min away from where Jason Rowland is planning to live, they do have the best melanoma expertise in [facility].  Reviewed the mechanism of action, rationale, and side effects of ipi/nivo.  Regarding prognosis, historically with melanoma brain metastases, survival was 6 months or less.  However, with immune therapy and further options for radiation, survival has dramatically improved, and a subset of patients have lasting and durable responses.  It is unclear where Jason Rowland falls within this spectrum, and we would get a better picture after his 3 month restaging scans.  All questions answered.- RTC 7/15 for next tx 23 min were spent in direct patient contact on the day of the encounter.    Drema Halon, MD, PhDMedical Oncology

## 2020-10-23 DIAGNOSIS — C4359 Malignant melanoma of other part of trunk: Secondary | ICD-10-CM

## 2020-10-29 ENCOUNTER — Ambulatory Visit: Admit: 2020-10-29 | Payer: BLUE CROSS/BLUE SHIELD

## 2020-10-29 ENCOUNTER — Inpatient Hospital Stay: Admit: 2020-10-29 | Discharge: 2020-10-29 | Payer: BLUE CROSS/BLUE SHIELD

## 2020-10-29 ENCOUNTER — Encounter: Admit: 2020-10-29 | Payer: PRIVATE HEALTH INSURANCE

## 2020-10-29 DIAGNOSIS — Z5112 Encounter for antineoplastic immunotherapy: Secondary | ICD-10-CM

## 2020-10-29 DIAGNOSIS — C7931 Secondary malignant neoplasm of brain: Secondary | ICD-10-CM

## 2020-10-29 DIAGNOSIS — E119 Type 2 diabetes mellitus without complications: Secondary | ICD-10-CM

## 2020-10-29 DIAGNOSIS — Z794 Long term (current) use of insulin: Secondary | ICD-10-CM

## 2020-10-29 DIAGNOSIS — Z8051 Family history of malignant neoplasm of kidney: Secondary | ICD-10-CM

## 2020-10-29 DIAGNOSIS — E7800 Pure hypercholesterolemia, unspecified: Secondary | ICD-10-CM

## 2020-10-29 DIAGNOSIS — C773 Secondary and unspecified malignant neoplasm of axilla and upper limb lymph nodes: Secondary | ICD-10-CM

## 2020-10-29 DIAGNOSIS — Z806 Family history of leukemia: Secondary | ICD-10-CM

## 2020-10-29 DIAGNOSIS — Z955 Presence of coronary angioplasty implant and graft: Secondary | ICD-10-CM

## 2020-10-29 DIAGNOSIS — Z79899 Other long term (current) drug therapy: Secondary | ICD-10-CM

## 2020-10-29 DIAGNOSIS — N200 Calculus of kidney: Secondary | ICD-10-CM

## 2020-10-29 DIAGNOSIS — Z8052 Family history of malignant neoplasm of bladder: Secondary | ICD-10-CM

## 2020-10-29 DIAGNOSIS — E1142 Type 2 diabetes mellitus with diabetic polyneuropathy: Secondary | ICD-10-CM

## 2020-10-29 DIAGNOSIS — C4359 Malignant melanoma of other part of trunk: Secondary | ICD-10-CM

## 2020-10-29 DIAGNOSIS — E785 Hyperlipidemia, unspecified: Secondary | ICD-10-CM

## 2020-10-29 DIAGNOSIS — Z7982 Long term (current) use of aspirin: Secondary | ICD-10-CM

## 2020-10-29 DIAGNOSIS — C792 Secondary malignant neoplasm of skin: Secondary | ICD-10-CM

## 2020-10-29 LAB — COMPREHENSIVE METABOLIC PANEL
BKR A/G RATIO: 1.2 x 1000/??L (ref 1.0–2.2)
BKR ALANINE AMINOTRANSFERASE (ALT): 25 U/L (ref 9–59)
BKR ALKALINE PHOSPHATASE: 71 U/L (ref 9–122)
BKR ANION GAP: 10 pg (ref 7–17)
BKR ASPARTATE AMINOTRANSFERASE (AST): 22 U/L (ref 10–35)
BKR AST/ALT RATIO: 0.9 x 1000/??L (ref 0.00–1.00)
BKR BILIRUBIN TOTAL: 0.4 mg/dL (ref <=1.2)
BKR BLOOD UREA NITROGEN: 22 mg/dL (ref 8–23)
BKR BUN / CREAT RATIO: 23.7 % — ABNORMAL HIGH (ref 8.0–23.0)
BKR CALCIUM: 9.5 mg/dL (ref 8.8–10.2)
BKR CHLORIDE: 100 mmol/L (ref 98–107)
BKR CO2: 28 mmol/L (ref 20–30)
BKR CREATININE: 0.93 mg/dL (ref 0.40–1.30)
BKR EGFR (AFR AMER): >60 mL/min/{1.73_m2} (ref >60)
BKR EGFR (NON AFRICAN AMERICAN): >60 mL/min/{1.73_m2} (ref >60)
BKR GLOBULIN: 3.5 g/dL (ref 2.3–3.5)
BKR GLUCOSE: 256 mg/dL — ABNORMAL HIGH (ref 70–100)
BKR POTASSIUM: 4.4 mmol/L (ref 3.3–5.3)
BKR PROTEIN TOTAL: 7.6 g/dL (ref 6.6–8.7)
BKR SODIUM: 138 mmol/L (ref 136–144)
BKR WAM PLATELETS: 0.93 mg/dL (ref 0.40–1.30)

## 2020-10-29 LAB — CBC WITH AUTO DIFFERENTIAL
BKR ALBUMIN: 6.6 % (ref 4.0–12.0)
BKR WAM ABSOLUTE IMMATURE GRANULOCYTES.: 0.04 x 1000/??L (ref 0.00–0.30)
BKR WAM ABSOLUTE LYMPHOCYTE COUNT.: 2.33 x 1000/ÂµL (ref 0.60–3.70)
BKR WAM ABSOLUTE NRBC (2 DEC): 0 x 1000/??L (ref 0.00–1.00)
BKR WAM ANALYZER ANC: 5.38 x 1000/ÂµL (ref 2.00–7.60)
BKR WAM BASOPHIL ABSOLUTE COUNT.: 0.06 x 1000/ÂµL (ref 0.00–1.00)
BKR WAM BASOPHILS: 0.7 % (ref 0.0–1.4)
BKR WAM EOSINOPHIL ABSOLUTE COUNT.: 0.34 x 1000/ÂµL (ref 0.00–1.00)
BKR WAM EOSINOPHILS: 3.9 % (ref 0.0–5.0)
BKR WAM HEMATOCRIT (2 DEC): 43.3 % (ref 38.50–50.00)
BKR WAM HEMOGLOBIN: 14 g/dL (ref 13.2–17.1)
BKR WAM IMMATURE GRANULOCYTES: 0.5 % (ref 0.0–1.0)
BKR WAM LYMPHOCYTES: 26.7 % (ref 17.0–50.0)
BKR WAM MCH (PG): 28.7 pg (ref 27.0–33.0)
BKR WAM MCHC: 32.3 g/dL — ABNORMAL HIGH (ref 31.0–36.0)
BKR WAM MCV: 88.7 fL (ref 80.0–100.0)
BKR WAM MONOCYTE ABSOLUTE COUNT.: 0.58 x 1000/ÂµL (ref 0.00–1.00)
BKR WAM MONOCYTES: 6.6 % (ref 4.0–12.0)
BKR WAM MPV: 10.6 fL (ref 8.0–12.0)
BKR WAM NEUTROPHILS: 61.6 % (ref 39.0–72.0)
BKR WAM NUCLEATED RED BLOOD CELLS: 0 % (ref 0.0–1.0)
BKR WAM RDW-CV: 13.6 % (ref 11.0–15.0)
BKR WAM RED BLOOD CELL COUNT.: 4.88 M/??L (ref 4.00–6.00)
BKR WAM WHITE BLOOD CELL COUNT: 8.7 x1000/??L (ref 4.0–11.0)

## 2020-10-29 LAB — LACTATE DEHYDROGENASE: BKR LACTATE DEHYDROGENASE: 201 U/L (ref 122–241)

## 2020-10-29 LAB — TSH W/REFLEX TO FT4     (BH GH LMW Q YH): BKR THYROID STIMULATING HORMONE: 4.55 ??IU/mL — ABNORMAL HIGH

## 2020-10-29 LAB — T4, FREE: BKR FREE T4: 1.04 ng/dL

## 2020-10-29 MED ORDER — FAMOTIDINE 4 MG/ML IN 0.9% SODIUM CHLORIDE (ADULT)
Freq: Once | INTRAVENOUS | Status: DC | PRN
Start: 2020-10-29 — End: 2020-10-29

## 2020-10-29 MED ORDER — NIVOLUMAB INFUSION
Freq: Once | INTRAVENOUS | Status: CP
Start: 2020-10-29 — End: ?
  Administered 2020-10-29: 17:00:00 50.000 mL/h via INTRAVENOUS

## 2020-10-29 MED ORDER — DIPHENHYDRAMINE 50 MG/ML INJECTION (WRAPPED E-RX)
50 mg/mL | Freq: Once | INTRAVENOUS | Status: DC | PRN
Start: 2020-10-29 — End: 2020-10-29

## 2020-10-29 MED ORDER — SODIUM CHLORIDE 0.9 % BOLUS (NEW BAG)
0.9 % | Freq: Once | INTRAVENOUS | Status: DC | PRN
Start: 2020-10-29 — End: 2020-10-29

## 2020-10-29 MED ORDER — EPINEPHRINE 0.3 MG/0.3 ML INJECTION, AUTO-INJECTOR
0.30.3 mg/ mL | INTRAMUSCULAR | Status: DC | PRN
Start: 2020-10-29 — End: 2020-10-29

## 2020-10-29 MED ORDER — MEPERIDINE 25 MG/2.5 ML IN 0.9% SODIUM CHLORIDE
Freq: Once | INTRAVENOUS | Status: DC | PRN
Start: 2020-10-29 — End: 2020-10-29

## 2020-10-29 MED ORDER — HYDROCORTISONE SODIUM SUCCINATE 100 MG SOLUTION FOR INJECTION
100 mg | Freq: Once | INTRAVENOUS | Status: DC | PRN
Start: 2020-10-29 — End: 2020-10-29

## 2020-10-29 MED ORDER — ALBUTEROL SULFATE 2.5 MG/3 ML (0.083 %) SOLUTION FOR NEBULIZATION
2.530.083 mg /3 mL (0.083 %) | RESPIRATORY_TRACT | Status: DC | PRN
Start: 2020-10-29 — End: 2020-10-29

## 2020-10-29 MED ORDER — IPILIMUMAB INFUSION >300 MG
Freq: Once | INTRAVENOUS | Status: CP
Start: 2020-10-29 — End: ?
  Administered 2020-10-29: 18:00:00 250.000 mL/h via INTRAVENOUS

## 2020-10-29 NOTE — Progress Notes
NURSING PROGRESS NOTETemp:  [98.2 ?F (36.8 ?C)] 98.2 ?F (36.8 ?C)Pulse:  [83] 83Resp:  [17] 17BP: (148)/(86) 148/86SpO2:  [97 %] 97 %Pt arrived A&Ox4, pleasant. Pt evaluated by Dr. Laveda Norman today and cleared for treatment. Today's labs were as follows:Complete Blood CountResults in Past 7 DaysResult Component Current Result Hematocrit 43.30 (10/29/2020) Hemoglobin 14.0 (10/29/2020) MCH 28.7 (10/29/2020) MCHC 32.3 (10/29/2020) MCV 88.7 (10/29/2020) MPV 10.6 (10/29/2020) Platelets 280 (10/29/2020) RBC 4.88 (10/29/2020) WBC 8.7 (10/29/2020)   Chemistry  Lab Results Component Value Date  NA 138 10/29/2020  K 4.4 10/29/2020  CL 100 10/29/2020  CO2 28 10/29/2020  BUN 22 10/29/2020  CREATININE 0.93 10/29/2020  GLU 256 (H) 10/29/2020  Lab Results Component Value Date  CALCIUM 9.5 10/29/2020  ALKPHOS 71 10/29/2020  AST 22 10/29/2020  ALT 25 10/29/2020  BILITOT 0.4 10/29/2020  Pt denied N/V/D/C, eating and drinking well, denied fevers, chills, but occasional dry cough noted. Pt c/o DOE and intermittent periods of fatigue. Orders reviewed and verified. PIV placed in Left lower arm by Judeth Cornfield, RN Engineer, manufacturing systems). Per orders, administered Nivolumab IV over 30 minutes; +brisk blood return noted. IV flushed per protocol. +BRisk blood return noted before and after [place]; IV flushed, removed and applied gauze with Coflex wrap to site. IV site post treatment without swelling, redness, or drainage. All questions asked were answered. Pt verbalized understanding and agreed with plan.Electronically Signed by Isaias Cowman, RN, October 29, 2020

## 2020-10-29 NOTE — Progress Notes
NAME:  Jason Rowland (October 10, 1945)AGE: 75 y.o. MRN:  [id number] PROVIDER: Era Bumpers APRNDATE OF SERVICE: 10/29/2020 [facility] [place]  REASON FOR VISIT:  Ongoing treatment ONC DX:  Invasive melanoma right mid backSTAGE:  Likely IV (pT4a N3 M1d(0) - originally stage IIIC with 30 mm with microsatellitosis, mitotic rate 2 per mm2).  Noted 09/18/20 to have new brain lesions (too small for biopsy or GKRS).MOLECULAR PROFILING:0% tumor and immune cell PD-L1 staining50-gene panel:  DNA VARIANT DETECTED ? ? 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 ? 74% PRIOR TX:  - WLE and SLNB 03/03/19- Adjuvant Moderna clinical trial (?A Phase 2 Randomized Study of Adjuvant Immunotherapy with the Personalized Cancer Vaccine [phone] and Pembrolizumab Versus Pembrolizumab Alone After Complete Resection of High-Risk Melanoma? Protocol [id number], HIC 47829) C1D1 05/08/19. Last dose 04/27/20. Randomized to the pembrolizumab and vaccine arm.- 07/22/20 resection of recurrent LN in the right upper back (1.6 cm LN with 1.1 cm tumor deposit)ACTIVE TX:   ipilimumab (3 mg/kg) + nivolumab (1 mg/kg) - (10/08/20 - )Onc Hx: Jason Rowland (prefers to be called Jason Rowland) is a 75 y.o. male who worked for [employer] (retired at the end of 02/2019 after working there for 50 years) with a PMH of DM2 complicated by peripheral neuropathy, HLD, and AKs who is referred by Joanie Coddington, his dermatologist for a right back metastatic melanoma.  He reports it appeared approx 10 months prior; his PCP originally thought it was a pimple.  Dr. Lorn Junes at [facility] later evaluated it and also thought it was a pimple and injected it with intralesional kenalog x 2 and oral cephalexin without improvement.  The lesion increased in size and burned whenever he leaned against anything.  Denies night sweats and weight loss.  Lucie Leather did a I&D on 01/07/19  with only slight bloody discharge, so a punch biopsy was obtain with path showing metastatic melanoma.  On 01/20/19, additional biopsies were done to try to evaluate a primary site, but we do not have this tissue.  Per patient report, these biopsies were negative. MRI brain 01/30/19 without intracranial disease.  PET done 02/07/19 showing preepiglottic FDG activity s/p ENT evaluation which was negative for any visible malignancy.  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 LDH elevated to 251 at diagnosis. His case was presented at the [facility] Mel[facility]oma tumor board on 02/13/19 and consensus was to ask ENT for random biopsies of the BOT and pre-epiglottic PET-avid areas, as we do not want to miss a head and neck cancer but otherwise not felt to be related to his melanoma.  The cervical LN were not thought to be related.  He should also have gastroenterology evaluation for a PET-avid stranding lesion in the transverse colon.  Dr. Landis Gandy (ENT) performed BOT and vallecula random biopsies 02/20/19 which only showed chronic inflammation and no malignancy.  He had a colonoscopy by Dr. Linton Flemings on 02/27/19 showing only a tubular adenoma in the sigmoid colon.  Overall, it was felt that his right mid back lesion is the site of his primary disease, and the metastatic melanoma pathology interpretation may have been complicated due to repeat I&Ds and steroid injections that may have altered the architecture of the primary lesion.  In the absence of any confirmed metastatic disease, he underwent WLE and LND with Dr. Duanne Moron on 03/03/19.  Final path showed a 30 mm thick melanoma, 2 mitoses/mm2, non-brisk TILs, no tumor regression, LVI+, microsatellites+, negative margins, and 3/35 lymph nodes (largest LN  4.5 x 3 cm) were positive for melanoma with presence of lymphatic invasion without extranodal extension.He uses a cane sometimes as he has some balance issues due to neuropathy in his feet.  Otherwise, he is fully functional, active, and independent in ADLs/iADLs.  No hx of autoimmune disease.03/27/19 signed consent for the Moderna trial; randomized to pembro and vaccine arm.  Started treatment C1D1 05/08/19.04/24/19 noted to have wound dehiscence, non-infected, healing by secondary intent.  Re-instated VNA services to assist with dressing changes and packing.  Wound completely healed by 06/20/19.10/27/19, 01/12/20, and 04/24/20 surveillance scans NED.07/05/20 - [place] chest showing an increased 1.0 cm subcutaneous nodule in the right upper back.  Bx 07/06/20 not diagnostic.  Excision by Dr. Duanne Moron 07/22/20 showed metastatic melanoma involving 1 LN with 11 mm tumor deposit in a 1.6 cm LN.   09/18/20 - MRI brain showing a 8 mm left hippocampus lesion and a 6 mm lesion in the left mesial temporal lobe.  Reports new subtle memory issues.Interval ZO:XWRU returns for follow-up and cycle 2ipi/nivo.  He hasn't noticed any memory changes since he was was seen.  He has ongoing fatigue and insomnia.  Denies any fevers, N/V. Admits to soft stools once daily approx 3 times a week.+1edema to lower extremities.Chronicred rash around umbilical area not causing pain, not itchy. He has cream that he inconsistently places on the rash. Biopsy to upper healing. Bandage had been left on for few weeks, removed at todays visit. no sign of infection noted. ECOG:  1Pain: 0/10Review of Systems:No fevers, chills, night sweats, lightheadedness, HA, CP, palpitations, wheezing, abd pain, constipation, blood in the urine or stools, dysuria.  Diabetic neuropathy in the hands and feet stable.  He has a prior hx of intermittent loose stools, chronic, without blood depending on his diet (often isolated to a specific restaurant he likes to visit with his buddies).  Positive responses on ROS as noted in interval hx. PAST MEDICAL HISTORY:Past Medical History: Diagnosis Date ? Diabetes mellitus (HC Code) (HC CODE) (HC Code)  ? High cholesterol  ? Kidney stones 40 years ago ? 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 CODE) (HC Code) 09/30/2020 ? Malignant melanoma of torso excluding breast (HC Code)  SURGICAL HISTORY:Past Surgical History: Procedure Laterality Date ? COLONOSCOPY   ? CORONARY ANGIOPLASTY WITH STENT PLACEMENT Bilateral   patient denies ? laryngoscopy with biopsy  02/20/2019 ? ROTATOR CUFF REPAIR Left 1999 ? TONSILLECTOMY    age 20 ? TOTAL HIP ARTHROPLASTY Right 2002 FAMILY HISTORY:Family History Problem Relation Age of Onset ? Heart disease Mother  ? Bladder cancer Father 39 ? Kidney cancer Sister  ? Leukemia Brother 46 ? No Known Problems Daughter  ? No Known Problems Son  SOCIAL HISTORY:Social History Socioeconomic History ? Marital status: Divorced   Spouse name: Not on file ? Number of children: Not on file ? Years of education: Not on file ? Highest education level: Not on file Occupational History ? Not on file Tobacco Use ? Smoking status: Never Smoker ? Smokeless tobacco: Never Used Vaping Use ? Vaping Use: Never used Substance and Sexual Activity ? Alcohol use: Not Currently ? Drug use: No ? Sexual activity: Not on file Other Topics Concern ? Not on file Social History Narrative  Divorced, works for [employer] in [place] will be retiring 03/17/19.  Has 1 son in [place] and 1 daughter in [place]. Llives in [place]  Social Determinants of Health Financial Resource Strain:  Not on file Food Insecurity: Not on file Transportation Needs: Not on file Physical Activity: Not on file Stress: Not on file Social Connections: Not on file Intimate Partner Violence: Not on file Housing Stability: Not on file ALLERGIES:No Known AllergiesMEDICATIONS:Current Outpatient Medications Medication Sig ? acetaminophen (TYLENOL) 325 mg tablet Take 3 tablets (975 mg total) by mouth every 6 (six) hours as needed (mild to moderate pain). ? ALPRAZolam (XANAX) 0.5 mg tablet 0.5 mg. ? aspirin 81 MG EC tablet Take 81 mg by mouth nightly.  ? docusate sodium (COLACE) 250 mg capsule Take 1 capsule (250 mg total) by mouth 2 (two) times daily as needed for constipation. ? FREESTYLE LIBRE 14 DAY sensor kit 1 each by Other route every 14 (fourteen) days. ? glipiZIDE (GLUCOTROL XL) 10 MG 24 hr tablet Take 10 mg by mouth nightly.  ? ibuprofen (ADVIL,MOTRIN) 200 mg tablet Take 2 tablets (400 mg total) by mouth every 6 (six) hours as needed (alternate with tylenol for mild to moderate pain). ? indomethacin (INDOCIN) 25 mg capsule 3 (three) times daily. ? levothyroxine (SYNTHROID, LEVOTHROID) 50 MCG tablet Take 50 mcg by mouth daily. ? olopatadine (PATANOL) 0.1 % ophthalmic solution as needed (takes for eye irritation as needed). ? simvastatin (ZOCOR) 20 MG tablet Take 20 mg by mouth nightly.  ? TOUJEO SOLOSTAR U-300 300 unit/mL (1.5 mL) pen Inject 30 Units under the skin nightly. ? triamcinolone (KENALOG) 0.1 % ointment Apply topically 2 (two) times daily as needed. To areas of rash. ? vit A/vit C/vit E/zinc/copper (PRESERVISION AREDS ORAL) Take by mouth 2 (two) times daily. ? zolpidem (AMBIEN) 10 mg tablet nightly.. No current facility-administered medications for this visit. Facility-Administered Medications Ordered in Other Visits Medication ? albuterol neb sol 2.5 mg/3 mL (0.083%) (PROVENTIL,VENTOLIN) ? diphenhydrAMINE (BENADRYL) injection 50 mg ? EPINEPHrine (EPI-PEN) auto-injector 0.3 mg ? EPINEPHrine (EPI-PEN) auto-injector 0.3 mg ? famotidine (PF) (PEPCID) 20 mg in sodium chloride 0.9 % 5 mL Injection ? hydrocortisone sodium succinate (Solu-CORTEF) injection 100 mg ? meperidine PF (DEMEROL) 25 mg in sodium chloride 0.9 % 2.5 mL Injection ? sodium chloride 0.9 % (new bag) bolus 500 mL VITALS: BP (!) 148/86  - Pulse 83  - Temp 98.2 ?F (36.8 ?C) (Temporal)  - Resp 17  - Wt 121.6 kg  - SpO2 97%  - BMI 37.91 kg/m?  PHYSICAL EXAM:Gen:  NAD, pleasant HEENT:  EOMI, PEARLLN:  No cervical, supraclavicular, axillary, or inguinal LAD. CV:  No murmurs, mild tachy, no m/r/g Pulm:  CTA b/lAbd:  Soft, no tenderness, no guarding or rebound, no organomegaly, no ascitesExt:  WWP, 1+ symmetric edema in the ankles.  Lymphedema present in the right arm, unchanged.Neuro:   No focal deficitsPsych:  Normal mood and affectSkin:  Scar on the right upper back well healed without evidence of recurrence. Small lateral skin puckering at the edges of the scar stable.  Flaking skin/dryness in the middle portion of the scar.  Right axillary scar well healed.  Scar from LN recurrence well healed. Rash around umbilical areaLABS:Results for orders placed or performed in visit on 10/29/20 TSH w/reflex to FT4     (BH GH LMW Q YH) Result Value Ref Range  Thyroid Stimulating Hormone 4.550 (H) See Comment ?IU/mL CBC auto differential Result Value Ref Range  WBC 8.7 4.0 - 11.0 x1000/?L  RBC 4.88 4.00 - 6.00 M/?L  Hemoglobin 14.0 13.2 - 17.1 g/dL  Hematocrit 98.11 91.47 - 50.00 %  MCV 88.7 80.0 - 100.0 fL  MCH 28.7  27.0 - 33.0 pg  MCHC 32.3 31.0 - 36.0 g/dL  RDW-CV 16.1 09.6 - 04.5 %  Platelets 280 150 - 420 x1000/?L  MPV 10.6 8.0 - 12.0 fL  Neutrophils 61.6 39.0 - 72.0 %  Lymphocytes 26.7 17.0 - 50.0 %  Monocytes 6.6 4.0 - 12.0 %  Eosinophils 3.9 0.0 - 5.0 %  Basophil 0.7 0.0 - 1.4 %  Immature Granulocytes 0.5 0.0 - 1.0 %  nRBC 0.0 0.0 - 1.0 %  ANC(Abs Neutrophil Count) 5.38 2.00 - 7.60 x 1000/?L  Absolute Lymphocyte Count 2.33 0.60 - 3.70 x 1000/?L  Monocyte Absolute Count 0.58 0.00 - 1.00 x 1000/?L  Eosinophil Absolute Count 0.34 0.00 - 1.00 x 1000/?L  Basophil Absolute Count 0.06 0.00 - 1.00 x 1000/?L  Absolute Immature Granulocyte Count 0.04 0.00 - 0.30 x 1000/?L  Absolute nRBC 0.00 0.00 - 1.00 x 1000/?L Comprehensive metabolic panel Result Value Ref Range  Sodium 138 136 - 144 mmol/L  Potassium 4.4 3.3 - 5.3 mmol/L  Chloride 100 98 - 107 mmol/L  CO2 28 20 - 30 mmol/L  Anion Gap 10 7 - 17  Glucose 256 (H) 70 - 100 mg/dL  BUN 22 8 - 23 mg/dL  Creatinine 4.09 8.11 - 1.30 mg/dL  Calcium 9.5 8.8 - 91.4 mg/dL  BUN/Creatinine Ratio 78.2 (H) 8.0 - 23.0  Total Protein 7.6 6.6 - 8.7 g/dL  Albumin 4.1 3.6 - 4.9 g/dL  Total Bilirubin 0.4 <=9.5 mg/dL  Alkaline Phosphatase 71 9 - 122 U/L  Alanine Aminotransferase (ALT) 25 9 - 59 U/L  Aspartate Aminotransferase (AST) 22 10 - 35 U/L  Globulin 3.5 2.3 - 3.5 g/dL  A/G Ratio 1.2 1.0 - 2.2  AST/ALT Ratio 0.9 See Comment  eGFR (Afr Amer) >60 >60 mL/min/1.50m2  eGFR (NON African-American) >60 >60 mL/min/1.92m2 LACTATE DEHYDROGENASE Result Value Ref Range  LD 201 122 - 241 U/L T4, free Result Value Ref Range  Free T4 1.04 See Comment ng/dL IMAGING/PATH:No results found. 07/22/20: SOFT TISSUE, RIGHT UPPER BACK, EXCISION: ? ? ? ?- METASTATIC MELANOMA INVOLVING A LYMPH NODE (1/1) ? ? ? ? ? - SIZE OF THE LARGEST TUMOR DEPOSIT: 11 MM ? ? ? ? ? - SIZE OF THE LYMPH NODE: 1.6 CM WLE and SLN 03/03/19:LYMPH NODES, RIGHT AXILLARY LEVELS 1, 2 AND 3, LYMPH NODE DISSECTION: ?  ? ? - ?THREE OF THIRTY-FIVE LYMPH NODES, POSITIVE FOR MELANOMA (3 OF 35) WITH LARGEST INVOLVED LYMPH NODE MEASURING 4.5 X 3 CM ? ? ?- LYMPHATIC INVASION IDENTIFIED ? ? ?- NO EXTRANODAL EXTENSION IDENTIFIED 1. ?SKIN, BACK, RIGHT UPPER, EXCISION: ? ? ? ?- MALIGNANT MELANOMA, SEE NOTE AND SYNOPTIC SUMMARY Note: Sections show a tumor composed of S100 positive epithelioid cells within the dermis and subcutaneous tissue. Cytokeratin AE1/AE3 and desmin are negative. A junctional component is not identified. The findings would be compatible with a primary dermal melanoma or a metastatic lesion. Clinical correlation is suggested. 2. ?SKIN, BACK, EXCISION: ? ? ? ?- BENIGN FIBROADIPOSE TISSUE AND SKELETAL MUSCLE SYNOPTIC SUMMARY MELANOMA OF THE SKIN Tumor Site: ? ? Back Laterality: ? ? Right Procedure: ? ? Primary excision Maximum Tumor Thickness (Depth): ? ? 30 mm Ulceration: ? ? Not identified Mitotic Rate (Mitoses/mm2): ? ? 2 Anatomic Level: ? ? V (Melanoma invades subcutis) Growth Phase: ? ? Vertical Histologic Type: ? ? Melanoma, NOS Tumor-Infiltrating Lymphocytes: ? ? Present, non-brisk Tumor Regression: ? ? Not identifed Lymphovascular Invasion: ? ? Present Microsatellitosis: ? ? Present Margins ? ? ?  Peripheral Margins: ? ? Uninvolved Deep Margin: ? ? Uninvolved Stage (AJCC 8th Ed): ? ? pT4a Nx Base of the tongue and vallecula random biopsies 02/20/19: 1. ?TONGUE, LEFT, BASE, BIOPSY: ? ? ? ?- LYMPHOID TISSUE HYPERPLASIA ? ? ?- NEGATIVE FOR MALIGNANCY 2. ?THROAT, VALLECULA, BIOPSY: ? ?  ? - LYMPHOID TISSUE HYPERPLASIA AND FOCAL CHRONIC INFLAMMATION. SEE NOTE. ? ? ?- NEGATIVE FOR MALIGNANCY ? ? ? ? ? Note: Immunostain of Sox-10 to investigate focal pigment is negative, supporting above interpretation. 3. ?THROAT, LEFT VALLECULA, BIOPSY: ? ? ? ?- LYMPHOID TISSUE HYPERPLASIA ? ? ?- NEGATIVE FOR MALIGNANCY 4. ?THROAT, RIGHT VALLECULA, BIOPSY: ? ? ? ? ? ? - LYMPHOID TISSUE HYPERPLASIA AND FOCAL CHRONIC INFLAMMATION ? ? ?- NEGATIVE FOR MALIGNANCY 5. ?TONGUE, RIGHT BASE, BIOPSY: ? ? ? ? ? ? - LYMPHOID TISSUE HYPERPLASIA AND FOCAL CHRONIC INFLAMMATION ? ? ?- NEGATIVE FOR MALIGNANCY Pathology (reviewed at [facility]), collected 01/07/19:DIAGNOSIS: ? 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 Clinical pathological correlation is recommended. MICROSCOPIC DESCRIPTION: There are atypical cells in a nodule in the dermis. The cells are positive with SOX-10 and by report are negative with LCA, Kappa, Lambda, and a cytokeratin. The cells are also positive with S-100 by report.ASSESSMENT/PLAN:Jason Rowland is a 75 y.o. 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 Duanne Moron on 03/03/19 showing a stage IIIC melanoma that was 30 mm thick, positive for LVI and microsatellites with 3/25 positive LNs.  Given the thickness of his primary melanoma and microsatellitosis, we felt that his melanoma-specific survival is closer to a stage IIID:  60% over 10 years with stage IIIC disease versus 24% with stage IIID.  He opted to proceed with the Moderna clinical trial (?A Phase 2 Randomized Study of Adjuvant Immunotherapy with the Personalized Cancer Vaccine [phone] and Pembrolizumab Versus Pembrolizumab Alone After Complete Resection of High-Risk Melanoma?) and completed 1 year of adjuvant therapy on trial without issues.  He developed an enlarged 1 cm SQ nodule overlying the right scapula noted on scans 07/05/20 s/p excision showing a 1.6 cm LN containing a 11 mm deposit of melanoma.MRI brain 09/18/20 showed a 8 mm left hippocampus lesion and a 6 mm lesion in the left mesial temporal lobe concerning for brain mets.  Reviewed the case with Dr. Winfred Leeds; lesions are too small and ill-defined to biopsy and would hold off on GKRS for now.  Given high suspicion for metastatic disease, recurrence of his disease recently, and fairly good performance status, would opt to be aggressive.  I reviewed with Jason Rowland that ideally, we would want to get a biopsy to establish stage IV disease, but given that we know his disease is aggressive and presented again shortly after discontinuation of adjuvant anti-PD-1, we can presume that this is metastatic disease and start a new line of systemic therapy.  Discussed the available brain met clinical trials that he is currently on, but he wants to move to [facility] and would like the pursue standard of care. Discussed data from [employer] 204 utilizing ipi 3 + nivo 1 in brain metastasis which demonstrated similar intracranial and extracranial efficacy.  Jason Rowland agreed to proceed.  SEs again reviewed.  Paper consent signed today.  Here today for cycle 2 Ipi/Nivo- labs reviewed and unremarkable. TSH 4.550.  - routine f/u  with dermatologist every 4-6 months.- RTC in 3 weeks for next cycle.- encouraged to call in the interim with any concerns  30 min were spent in chart review and direct patient contact on the day of the encounter.    Era Bumpers APRN FNP -[facility]

## 2020-10-29 NOTE — Patient Instructions
PLEASE CALL OFFICE IF YOU EXPERIENCE:-Change in Mental Status such as confusion -Develop temperature greater than 100.4, Chills, or shaking.-yellow/green sputum with cough -urinary frequency/urgency or other infectious processes; pain with urination -Nausea/vomiting that is not well controlled with home anti-emetics - Constipation/Diarrhea that is not well controlled with home medications - Any unusual bleeding or bruising; any blood from sputum, urine or stool - Any falls at home where you hit your head -Poor PO intake or signs of dehydration (dizziness upon standing, very dry mucous membranes, dark concentrated urine) -Change in mental status such as confusion-Swelling-Rash REMEMBER TO  - Wash hands and utilize good hand hygiene before and after use of bathroom and while eating - Avoid sick family member and friends with contagious sicknesses  - Consume small frequent meals high in protein and calories (5-6 meals) vs. (3 meals);  DO NOT EAT UNDERCOOKED MEATS OR FISH- Wash fresh fruits and vegetables- Drink 6-8 8 ounce glasses of non-caffeinated, non-alcoholic beverages daily for adequate hydration Take antinausea medications as directed (either Zofran or Compazine). Eat small frequent meals with a protein to decrease risk of nausea and improve energy. If anti nausea meds ineffective call the office.  Good Mouth Care. Use soft bristle touch brush at least 2 x per day.  Rinse mouth using baking soda and water before or after meals as needed.  No commercial mouth rinses unless they are alcoholic free, ie: Biotene. Diarrhea Use imodium as directed on the box at the 1st sign of diarrhea and if in effective call the office to start Lomotil.

## 2020-11-19 ENCOUNTER — Inpatient Hospital Stay: Admit: 2020-11-19 | Discharge: 2020-11-19 | Payer: BLUE CROSS/BLUE SHIELD

## 2020-11-19 ENCOUNTER — Ambulatory Visit: Admit: 2020-11-19 | Payer: BLUE CROSS/BLUE SHIELD

## 2020-11-19 ENCOUNTER — Encounter: Admit: 2020-11-19 | Payer: PRIVATE HEALTH INSURANCE

## 2020-11-19 DIAGNOSIS — E1142 Type 2 diabetes mellitus with diabetic polyneuropathy: Secondary | ICD-10-CM

## 2020-11-19 DIAGNOSIS — C7931 Secondary malignant neoplasm of brain: Secondary | ICD-10-CM

## 2020-11-19 DIAGNOSIS — C439 Malignant melanoma of skin, unspecified: Secondary | ICD-10-CM

## 2020-11-19 DIAGNOSIS — C4359 Malignant melanoma of other part of trunk: Secondary | ICD-10-CM

## 2020-11-19 DIAGNOSIS — Z5112 Encounter for antineoplastic immunotherapy: Secondary | ICD-10-CM

## 2020-11-19 DIAGNOSIS — Z794 Long term (current) use of insulin: Secondary | ICD-10-CM

## 2020-11-19 DIAGNOSIS — E7800 Pure hypercholesterolemia, unspecified: Secondary | ICD-10-CM

## 2020-11-19 DIAGNOSIS — E119 Type 2 diabetes mellitus without complications: Secondary | ICD-10-CM

## 2020-11-19 DIAGNOSIS — N200 Calculus of kidney: Secondary | ICD-10-CM

## 2020-11-19 LAB — COMPREHENSIVE METABOLIC PANEL
BKR A/G RATIO: 1.2 x 1000/??L (ref 1.0–2.2)
BKR ALANINE AMINOTRANSFERASE (ALT): 18 U/L (ref 9–59)
BKR ALBUMIN: 4.1 g/dL (ref 3.6–4.9)
BKR ALKALINE PHOSPHATASE: 65 U/L (ref 9–122)
BKR ANION GAP: 10 pg (ref 7–17)
BKR ASPARTATE AMINOTRANSFERASE (AST): 18 U/L (ref 10–35)
BKR AST/ALT RATIO: 1
BKR BILIRUBIN TOTAL: 0.3 mg/dL (ref 0.0–<=1.2)
BKR BLOOD UREA NITROGEN: 20 mg/dL (ref 0.00–0.40)
BKR BUN / CREAT RATIO: 23 (ref 8.0–23.0)
BKR CALCIUM: 9.4 mg/dL (ref 8.8–10.2)
BKR CO2: 27 mmol/L (ref 20–30)
BKR CREATININE: 0.87 mg/dL (ref 0.40–1.30)
BKR EGFR (AFR AMER): >60 mL/min/1.73m2 (ref >60)
BKR EGFR (NON AFRICAN AMERICAN): >60 mL/min/1.73m2 (ref >60)
BKR GLOBULIN: 3.3 g/dL (ref 2.3–3.5)
BKR GLUCOSE: 179 mg/dL — ABNORMAL HIGH (ref 70–100)
BKR POTASSIUM: 4.6 mmol/L (ref 3.3–5.3)
BKR PROTEIN TOTAL: 7.4 g/dL (ref 6.6–8.7)
BKR SODIUM: 136 mmol/L (ref 136–144)
BKR WAM IMMATURE GRANULOCYTES: 18 U/L (ref 9–59)
BKR WAM MONOCYTES: 4.1 g/dL (ref 3.6–4.9)
BKR WAM NEUTROPHILS: 23 % (ref 8.0–23.0)
BKR WAM RED BLOOD CELL COUNT.: 136 mmol/L (ref 136–144)

## 2020-11-19 LAB — CBC WITH AUTO DIFFERENTIAL
BKR CHLORIDE: 42.5 % (ref 38.50–50.00)
BKR WAM ABSOLUTE IMMATURE GRANULOCYTES.: 0.04 x 1000/??L (ref 0.00–0.30)
BKR WAM ABSOLUTE LYMPHOCYTE COUNT.: 2.16 x 1000/ÂµL (ref 0.60–3.70)
BKR WAM ABSOLUTE NRBC (2 DEC): 0 x 1000/??L (ref 0.00–1.00)
BKR WAM ANALYZER ANC: 5.77 x 1000/ÂµL (ref 2.00–7.60)
BKR WAM BASOPHIL ABSOLUTE COUNT.: 0.08 x 1000/ÂµL (ref 0.00–1.00)
BKR WAM BASOPHILS: 0.9 % (ref 0.0–1.4)
BKR WAM EOSINOPHIL ABSOLUTE COUNT.: 0.46 x 1000/ÂµL (ref 0.00–1.00)
BKR WAM EOSINOPHILS: 5 % (ref 0.0–5.0)
BKR WAM HEMATOCRIT (2 DEC): 42.5 % (ref 38.50–50.00)
BKR WAM HEMOGLOBIN: 14.1 g/dL (ref 13.2–17.1)
BKR WAM LYMPHOCYTES: 23.7 % (ref 17.0–50.0)
BKR WAM MCH (PG): 29.3 pg (ref 27.0–33.0)
BKR WAM MCHC: 33.2 g/dL (ref 31.0–36.0)
BKR WAM MCV: 88.2 fL (ref 80.0–100.0)
BKR WAM MONOCYTE ABSOLUTE COUNT.: 0.62 x 1000/??L (ref 0.00–1.00)
BKR WAM MPV: 10.7 fL (ref 8.0–12.0)
BKR WAM NUCLEATED RED BLOOD CELLS: 0 % — ABNORMAL HIGH (ref 0.0–1.0)
BKR WAM PLATELETS: 222 x1000/ÂµL (ref 150–420)
BKR WAM RDW-CV: 13.4 % (ref 11.0–15.0)
BKR WAM WHITE BLOOD CELL COUNT: 9.1 x1000/??L (ref 4.0–11.0)

## 2020-11-19 LAB — LACTATE DEHYDROGENASE: BKR LACTATE DEHYDROGENASE: 229 U/L — ABNORMAL HIGH (ref 70–100)

## 2020-11-19 LAB — T4, FREE: BKR FREE T4: 1.05 ng/dL

## 2020-11-19 LAB — TSH W/REFLEX TO FT4     (BH GH LMW Q YH): BKR THYROID STIMULATING HORMONE: 5 u[IU]/mL — ABNORMAL HIGH

## 2020-11-19 MED ORDER — ALBUTEROL SULFATE 2.5 MG/3 ML (0.083 %) SOLUTION FOR NEBULIZATION
2.530.083 mg /3 mL (0.083 %) | RESPIRATORY_TRACT | Status: DC | PRN
Start: 2020-11-19 — End: 2020-11-19

## 2020-11-19 MED ORDER — IPILIMUMAB INFUSION >300 MG
Freq: Once | INTRAVENOUS | Status: CP
Start: 2020-11-19 — End: ?
  Administered 2020-11-19: 17:00:00 250.000 mL/h via INTRAVENOUS

## 2020-11-19 MED ORDER — SODIUM CHLORIDE 0.9 % INTRAVENOUS SOLUTION
INTRAVENOUS | Status: DC | PRN
Start: 2020-11-19 — End: 2020-11-19

## 2020-11-19 MED ORDER — FAMOTIDINE 4 MG/ML IN 0.9% SODIUM CHLORIDE (ADULT)
Freq: Once | INTRAVENOUS | Status: DC | PRN
Start: 2020-11-19 — End: 2020-11-19

## 2020-11-19 MED ORDER — SODIUM CHLORIDE 0.9 % BOLUS (NEW BAG)
0.9 % | Freq: Once | INTRAVENOUS | Status: DC | PRN
Start: 2020-11-19 — End: 2020-11-19

## 2020-11-19 MED ORDER — EPINEPHRINE 0.3 MG/0.3 ML INJECTION, AUTO-INJECTOR
0.30.3 mg/ mL | INTRAMUSCULAR | Status: DC | PRN
Start: 2020-11-19 — End: 2020-11-19

## 2020-11-19 MED ORDER — DIPHENHYDRAMINE 50 MG/ML INJECTION (WRAPPED E-RX)
50 mg/mL | Freq: Once | INTRAVENOUS | Status: DC | PRN
Start: 2020-11-19 — End: 2020-11-19

## 2020-11-19 MED ORDER — NIVOLUMAB INFUSION
Freq: Once | INTRAVENOUS | Status: CP
Start: 2020-11-19 — End: ?
  Administered 2020-11-19: 17:00:00 50.000 mL/h via INTRAVENOUS

## 2020-11-19 MED ORDER — MEPERIDINE 25 MG/2.5 ML IN 0.9% SODIUM CHLORIDE
Freq: Once | INTRAVENOUS | Status: DC | PRN
Start: 2020-11-19 — End: 2020-11-19

## 2020-11-19 MED ORDER — HYDROCORTISONE SODIUM SUCCINATE 100 MG SOLUTION FOR INJECTION
100 mg | Freq: Once | INTRAVENOUS | Status: DC | PRN
Start: 2020-11-19 — End: 2020-11-19

## 2020-11-19 NOTE — Progress Notes
NAME:  Jason Rowland (07/22/1945)AGE: 75 y.o. MRN:  [id number] PROVIDER: Era Bumpers APRNDATE OF SERVICE: 11/19/2020 [facility] [place]  REASON FOR VISIT:  Ongoing treatment ONC DX:  Invasive melanoma right mid backSTAGE:  Likely IV (pT4a N3 M1d(0) - originally stage IIIC with 30 mm with microsatellitosis, mitotic rate 2 per mm2).  Noted 09/18/20 to have new brain lesions (too small for biopsy or GKRS).MOLECULAR PROFILING:0% tumor and immune cell PD-L1 staining50-gene panel:  DNA VARIANT DETECTED ? ? 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 ? 74% PRIOR TX:  - WLE and SLNB 03/03/19- Adjuvant Moderna clinical trial (?A Phase 2 Randomized Study of Adjuvant Immunotherapy with the Personalized Cancer Vaccine [phone] and Pembrolizumab Versus Pembrolizumab Alone After Complete Resection of High-Risk Melanoma? Protocol [id number], HIC 47829) C1D1 05/08/19. Last dose 04/27/20. Randomized to the pembrolizumab and vaccine arm.- 07/22/20 resection of recurrent LN in the right upper back (1.6 cm LN with 1.1 cm tumor deposit)ACTIVE TX:   ipilimumab (3 mg/kg) + nivolumab (1 mg/kg) - (10/08/20 - )Onc Hx: Jason Rowland (prefers to be called Jason Rowland) is a 75 y.o. male who worked for [employer] (retired at the end of 02/2019 after working there for 50 years) with a PMH of DM2 complicated by peripheral neuropathy, HLD, and AKs who is referred by Joanie Coddington, his dermatologist for a right back metastatic melanoma.  He reports it appeared approx 10 months prior; his PCP originally thought it was a pimple.  Dr. Lorn Junes at [facility] later evaluated it and also thought it was a pimple and injected it with intralesional kenalog x 2 and oral cephalexin without improvement.  The lesion increased in size and burned whenever he leaned against anything.  Denies night sweats and weight loss.  Lucie Leather did a I&D on 01/07/19  with only slight bloody discharge, so a punch biopsy was obtain with path showing metastatic melanoma.  On 01/20/19, additional biopsies were done to try to evaluate a primary site, but we do not have this tissue.  Per patient report, these biopsies were negative. MRI brain 01/30/19 without intracranial disease.  PET done 02/07/19 showing preepiglottic FDG activity s/p ENT evaluation which was negative for any visible malignancy.  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 LDH elevated to 251 at diagnosis. His case was presented at the [facility]oma tumor board on 02/13/19 and consensus was to ask ENT for random biopsies of the BOT and pre-epiglottic PET-avid areas, as we do not want to miss a head and neck cancer but otherwise not felt to be related to his melanoma.  The cervical LN were not thought to be related.  He should also have gastroenterology evaluation for a PET-avid stranding lesion in the transverse colon.  Dr. Landis Gandy (ENT) performed BOT and vallecula random biopsies 02/20/19 which only showed chronic inflammation and no malignancy.  He had a colonoscopy by Dr. Linton Flemings on 02/27/19 showing only a tubular adenoma in the sigmoid colon.  Overall, it was felt that his right mid back lesion is the site of his primary disease, and the metastatic melanoma pathology interpretation may have been complicated due to repeat I&Ds and steroid injections that may have altered the architecture of the primary lesion.  In the absence of any confirmed metastatic disease, he underwent WLE and LND with Dr. Duanne Moron on 03/03/19.  Final path showed a 30 mm thick melanoma, 2 mitoses/mm2, non-brisk TILs, no tumor regression, LVI+, microsatellites+, negative margins, and 3/35 lymph nodes (largest LN  4.5 x 3 cm) were positive for melanoma with presence of lymphatic invasion without extranodal extension.He uses a cane sometimes as he has some balance issues due to neuropathy in his feet.  Otherwise, he is fully functional, active, and independent in ADLs/iADLs.  No hx of autoimmune disease.03/27/19 signed consent for the Moderna trial; randomized to pembro and vaccine arm.  Started treatment C1D1 05/08/19.04/24/19 noted to have wound dehiscence, non-infected, healing by secondary intent.  Re-instated VNA services to assist with dressing changes and packing.  Wound completely healed by 06/20/19.10/27/19, 01/12/20, and 04/24/20 surveillance scans NED.07/05/20 - [place] chest showing an increased 1.0 cm subcutaneous nodule in the right upper back.  Bx 07/06/20 not diagnostic.  Excision by Dr. Duanne Moron 07/22/20 showed metastatic melanoma involving 1 LN with 11 mm tumor deposit in a 1.6 cm LN.   09/18/20 - MRI brain showing a 8 mm left hippocampus lesion and a 6 mm lesion in the left mesial temporal lobe.  Reports new subtle memory issues.Interval ZO:XWRU returns for follow-up and cycle 3ipi/nivo. Just came back from 1 week [place] visiting his daughter, he has been doing well. Having issued with his Libre needle; they  keep breaking, however he has been careful with is diet and monitoring with finger sticks if needed. Denies any pain or fatigue. If he does note body aches he takes tylenol/motrin prn.  Reports allergic eye issues, has rx pataday drop however ran out. Using visine prn, denies HA, vision changes, floaters, dizziness. Very seldom episodes of dairrhea, usually isolated to dietary sensitivites. For example yesterday had Diarrhea - 3 episodes  Few hours after eating lobster bique, potato with butter and broccoli. Jason Rowland knows to call if the diarrhea is daily and more than 3 episodes. Wound to  left shin open, slight redness around the wound. Unsure how it happened. Denies pain or itching. Was using a steroid cream on it+ 1 chronic edema noted to b/l LE. Also reports right upper lymphedema to be more noticeable lately especially to the right anterior axillary  chest wall. No masses or lesions appreciated. Swelling to soft tissue noted. Suggested lymphedema sleeve, he declined at this time. Chronic  eczema rash around umbilical area not causing pain, not itchy. He has cream that he inconsistently places on the rash. Biopsy to upper back healed. Denies any fevers  ECOG:  1Pain: 0/10Review of Systems:No fevers, chills, night sweats, lightheadedness, HA, CP, palpitations, wheezing, abd pain, constipation, blood in the urine or stools, dysuria.  Diabetic neuropathy in the hands and feet stable.  He has a prior hx of intermittent loose stools, chronic, without blood depending on his diet (often isolated to a specific restaurant he likes to visit with his buddies).  Positive responses on ROS as noted in interval hx. PAST MEDICAL HISTORY:Past Medical History: Diagnosis Date ? Diabetes mellitus (HC Code) (HC CODE) (HC Code)  ? High cholesterol  ? Kidney stones   40 years ago ? 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 Malignant melanoma of torso excluding breast (HC Code)  SURGICAL HISTORY:Past Surgical History: Procedure Laterality Date ? COLONOSCOPY   ? CORONARY ANGIOPLASTY WITH STENT PLACEMENT Bilateral   patient denies ? laryngoscopy with biopsy  02/20/2019 ? ROTATOR CUFF REPAIR Left 1999 ? TONSILLECTOMY    age 85 ? TOTAL HIP ARTHROPLASTY Right 2002 FAMILY HISTORY:Family History Problem Relation Age of Onset ? Heart disease Mother  ? Bladder cancer Father 37 ?  Kidney cancer Sister  ? Leukemia Brother 35 ? No Known Problems Daughter  ? No Known Problems Son  SOCIAL HISTORY:Social History Socioeconomic History ? Marital status: Divorced   Spouse name: Not on file ? Number of children: Not on file ? Years of education: Not on file ? Highest education level: Not on file Occupational History ? Not on file Tobacco Use ? Smoking status: Never Smoker ? Smokeless tobacco: Never Used Vaping Use ? Vaping Use: Never used Substance and Sexual Activity ? Alcohol use: Not Currently ? Drug use: No ? Sexual activity: Not on file Other Topics Concern ? Not on file Social History Narrative  Divorced, works for [employer] in [place] will be retiring 03/17/19.  Has 1 son in [place] and 1 daughter in [place]. Llives in [place]  Social Determinants of Health Financial Resource Strain: Not on file Food Insecurity: Not on file Transportation Needs: Not on file Physical Activity: Not on file Stress: Not on file Social Connections: Not on file Intimate Partner Violence: Not on file Housing Stability: Not on file ALLERGIES:No Known AllergiesMEDICATIONS:Current Outpatient Medications Medication Sig ? acetaminophen (TYLENOL) 325 mg tablet Take 3 tablets (975 mg total) by mouth every 6 (six) hours as needed (mild to moderate pain). ? ALPRAZolam (XANAX) 0.5 mg tablet 0.5 mg. ? aspirin 81 MG EC tablet Take 81 mg by mouth nightly.  ? docusate sodium (COLACE) 250 mg capsule Take 1 capsule (250 mg total) by mouth 2 (two) times daily as needed for constipation. ? FREESTYLE LIBRE 14 DAY sensor kit 1 each by Other route every 14 (fourteen) days. ? glipiZIDE (GLUCOTROL XL) 10 MG 24 hr tablet Take 10 mg by mouth nightly.  ? ibuprofen (ADVIL,MOTRIN) 200 mg tablet Take 2 tablets (400 mg total) by mouth every 6 (six) hours as needed (alternate with tylenol for mild to moderate pain). ? indomethacin (INDOCIN) 25 mg capsule 3 (three) times daily. ? levothyroxine (SYNTHROID, LEVOTHROID) 50 MCG tablet Take 50 mcg by mouth daily. ? olopatadine (PATANOL) 0.1 % ophthalmic solution as needed (takes for eye irritation as needed). ? simvastatin (ZOCOR) 20 MG tablet Take 20 mg by mouth nightly.  ? TOUJEO SOLOSTAR U-300 300 unit/mL (1.5 mL) pen Inject 30 Units under the skin nightly. ? triamcinolone (KENALOG) 0.1 % ointment Apply topically 2 (two) times daily as needed. To areas of rash. ? vit A/vit C/vit E/zinc/copper (PRESERVISION AREDS ORAL) Take by mouth 2 (two) times daily. ? zolpidem (AMBIEN) 10 mg tablet nightly.. No current facility-administered medications for this visit. VITALS: BP (!) 146/91  - Pulse 79  - Temp 97.8 ?F (36.6 ?C) (Temporal)  - Resp 18  - Wt 121.3 kg  - SpO2 97%  - BMI 37.84 kg/m?  PHYSICAL EXAM:Gen:  NAD, pleasant HEENT:  EOMI, PEARLLN:  No cervical, supraclavicular, axillary, or inguinal LAD. CV:  No murmurs, mild tachy, no m/r/g Pulm:  CTA b/lAbd:  Soft, no tenderness, no guarding or rebound, no organomegaly, no ascitesExt:  WWP, 1+ symmetric edema in the ankles.  Lymphedema present in the right arm, unchanged.Neuro:   No focal deficitsPsych:  Normal mood and affectSkin:  Scar on the right upper back well healed without evidence of recurrence. Small lateral skin puckering at the edges of the scar stable.  Flaking skin/dryness in the middle portion of the scar.  Right axillary scar well healed.  Scar from LN recurrence well healed. Eczema to  umbilical areaLABS:Results for orders placed or performed in visit on 11/19/20 TSH w/reflex to FT4     (  BH GH LMW Q YH) Result Value Ref Range  Thyroid Stimulating Hormone 5.000 (H) See Comment ?IU/mL CBC auto differential Result Value Ref Range  WBC 9.1 4.0 - 11.0 x1000/?L  RBC 4.82 4.00 - 6.00 M/?L  Hemoglobin 14.1 13.2 - 17.1 g/dL  Hematocrit 16.10 96.04 - 50.00 %  MCV 88.2 80.0 - 100.0 fL  MCH 29.3 27.0 - 33.0 pg  MCHC 33.2 31.0 - 36.0 g/dL  RDW-CV 54.0 98.1 - 19.1 %  Platelets 222 150 - 420 x1000/?L  MPV 10.7 8.0 - 12.0 fL  Neutrophils 63.2 39.0 - 72.0 %  Lymphocytes 23.7 17.0 - 50.0 %  Monocytes 6.8 4.0 - 12.0 %  Eosinophils 5.0 0.0 - 5.0 %  Basophil 0.9 0.0 - 1.4 %  Immature Granulocytes 0.4 0.0 - 1.0 %  nRBC 0.0 0.0 - 1.0 %  ANC(Abs Neutrophil Count) 5.77 2.00 - 7.60 x 1000/?L  Absolute Lymphocyte Count 2.16 0.60 - 3.70 x 1000/?L  Monocyte Absolute Count 0.62 0.00 - 1.00 x 1000/?L  Eosinophil Absolute Count 0.46 0.00 - 1.00 x 1000/?L  Basophil Absolute Count 0.08 0.00 - 1.00 x 1000/?L  Absolute Immature Granulocyte Count 0.04 0.00 - 0.30 x 1000/?L  Absolute nRBC 0.00 0.00 - 1.00 x 1000/?L Comprehensive metabolic panel Result Value Ref Range  Sodium 136 136 - 144 mmol/L  Potassium 4.6 3.3 - 5.3 mmol/L  Chloride 99 98 - 107 mmol/L  CO2 27 20 - 30 mmol/L  Anion Gap 10 7 - 17  Glucose 179 (H) 70 - 100 mg/dL  BUN 20 8 - 23 mg/dL  Creatinine 4.78 2.95 - 1.30 mg/dL  Calcium 9.4 8.8 - 62.1 mg/dL  BUN/Creatinine Ratio 30.8 8.0 - 23.0  Total Protein 7.4 6.6 - 8.7 g/dL  Albumin 4.1 3.6 - 4.9 g/dL  Total Bilirubin 0.3 <=6.5 mg/dL  Alkaline Phosphatase 65 9 - 122 U/L  Alanine Aminotransferase (ALT) 18 9 - 59 U/L  Aspartate Aminotransferase (AST) 18 10 - 35 U/L  Globulin 3.3 2.3 - 3.5 g/dL  A/G Ratio 1.2 1.0 - 2.2  AST/ALT Ratio 1.0 See Comment  eGFR (Afr Amer) >60 >60 mL/min/1.71m2  eGFR (NON African-American) >60 >60 mL/min/1.67m2 T4, free Result Value Ref Range  Free T4 1.05 See Comment ng/dL IMAGING/PATH:No results found. 07/22/20: SOFT TISSUE, RIGHT UPPER BACK, EXCISION: ? ? ? ?- METASTATIC MELANOMA INVOLVING A LYMPH NODE (1/1) ? ? ? ? ? - SIZE OF THE LARGEST TUMOR DEPOSIT: 11 MM ? ? ? ? ? - SIZE OF THE LYMPH NODE: 1.6 CM WLE and SLN 03/03/19:LYMPH NODES, RIGHT AXILLARY LEVELS 1, 2 AND 3, LYMPH NODE DISSECTION: ?  ? ? - ?THREE OF THIRTY-FIVE LYMPH NODES, POSITIVE FOR MELANOMA (3 OF 35) WITH LARGEST INVOLVED LYMPH NODE MEASURING 4.5 X 3 CM ? ? ?- LYMPHATIC INVASION IDENTIFIED ? ? ?- NO EXTRANODAL EXTENSION IDENTIFIED 1. ?SKIN, BACK, RIGHT UPPER, EXCISION: ? ? ? ?- MALIGNANT MELANOMA, SEE NOTE AND SYNOPTIC SUMMARY Note: Sections show a tumor composed of S100 positive epithelioid cells within the dermis and subcutaneous tissue. Cytokeratin AE1/AE3 and desmin are negative. A junctional component is not identified. The findings would be compatible with a primary dermal melanoma or a metastatic lesion. Clinical correlation is suggested. 2. ?SKIN, BACK, EXCISION: ? ? ? ?- BENIGN FIBROADIPOSE TISSUE AND SKELETAL MUSCLE SYNOPTIC SUMMARY MELANOMA OF THE SKIN Tumor Site: ? ? Back Laterality: ? ? Right Procedure: ? ? Primary excision Maximum Tumor Thickness (Depth): ? ? 30 mm Ulceration: ? ? Not identified  Mitotic Rate (Mitoses/mm2): ? ? 2 Anatomic Level: ? ? V (Melanoma invades subcutis) Growth Phase: ? ? Vertical Histologic Type: ? ? Melanoma, NOS Tumor-Infiltrating Lymphocytes: ? ? Present, non-brisk Tumor Regression: ? ? Not identifed Lymphovascular Invasion: ? ? Present Microsatellitosis: ? ? Present Margins ? ? ?Peripheral Margins: ? ? Uninvolved Deep Margin: ? ? Uninvolved Stage (AJCC 8th Ed): ? ? pT4a Nx Base of the tongue and vallecula random biopsies 02/20/19: 1. ?TONGUE, LEFT, BASE, BIOPSY: ? ? ? ?- LYMPHOID TISSUE HYPERPLASIA ? ? ?- NEGATIVE FOR MALIGNANCY 2. ?THROAT, VALLECULA, BIOPSY: ? ?  ? - LYMPHOID TISSUE HYPERPLASIA AND FOCAL CHRONIC INFLAMMATION. SEE NOTE. ? ? ?- NEGATIVE FOR MALIGNANCY ? ? ? ? ? Note: Immunostain of Sox-10 to investigate focal pigment is negative, supporting above interpretation. 3. ?THROAT, LEFT VALLECULA, BIOPSY: ? ? ? ?- LYMPHOID TISSUE HYPERPLASIA ? ? ?- NEGATIVE FOR MALIGNANCY 4. ?THROAT, RIGHT VALLECULA, BIOPSY: ? ? ? ? ? ? - LYMPHOID TISSUE HYPERPLASIA AND FOCAL CHRONIC INFLAMMATION ? ? ?- NEGATIVE FOR MALIGNANCY 5. ?TONGUE, RIGHT BASE, BIOPSY: ? ? ? ? ? ? - LYMPHOID TISSUE HYPERPLASIA AND FOCAL CHRONIC INFLAMMATION ? ? ?- NEGATIVE FOR MALIGNANCY Pathology (reviewed at [facility]), collected 01/07/19:DIAGNOSIS: ? 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 Clinical pathological correlation is recommended. MICROSCOPIC DESCRIPTION: There are atypical cells in a nodule in the dermis. The cells are positive with SOX-10 and by report are negative with LCA, Kappa, Lambda, and a cytokeratin. The cells are also positive with S-100 by report.ASSESSMENT/PLAN:Jason Rowland is a 75 y.o. 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 Duanne Moron on 03/03/19 showing a stage IIIC melanoma that was 30 mm thick, positive for LVI and microsatellites with 3/25 positive LNs.  Given the thickness of his primary melanoma and microsatellitosis, we felt that his melanoma-specific survival is closer to a stage IIID:  60% over 10 years with stage IIIC disease versus 24% with stage IIID.  He opted to proceed with the Moderna clinical trial (?A Phase 2 Randomized Study of Adjuvant Immunotherapy with the Personalized Cancer Vaccine [phone] and Pembrolizumab Versus Pembrolizumab Alone After Complete Resection of High-Risk Melanoma?) and completed 1 year of adjuvant therapy on trial without issues.  He developed an enlarged 1 cm SQ nodule overlying the right scapula noted on scans 07/05/20 s/p excision showing a 1.6 cm LN containing a 11 mm deposit of melanoma.MRI brain 09/18/20 showed a 8 mm left hippocampus lesion and a 6 mm lesion in the left mesial temporal lobe concerning for brain mets.  Reviewed the case with Dr. Winfred Leeds; lesions are too small and ill-defined to biopsy and would hold off on GKRS for now.  Given high suspicion for metastatic disease, recurrence of his disease recently, and fairly good performance status, would opt to be aggressive.  I reviewed with Jason Rowland that ideally, we would want to get a biopsy to establish stage IV disease, but given that we know his disease is aggressive and presented again shortly after discontinuation of adjuvant anti-PD-1, we can presume that this is metastatic disease and start a new line of systemic therapy.  Discussed the available brain met clinical trials that he is currently on, but he wants to move to [facility] and would like the pursue standard of care. Discussed data from  CheckMate 204 utilizing ipi 3 + nivo 1 in brain metastasis which demonstrated similar intracranial and extracranial efficacy.  Jason Rowland agreed to proceed.  SEs again reviewed.  Paper consent signed today.  Here today for cycle 3 Ipi/Nivo- labs reviewed and unremarkable. TSH 5.000  - routine f/u with dermatologist every 4-6 months.- RTC in 3 weeks for next cycle for last cycle of ipi/nivo and then will switch to nivo monotherapy.- hoping to spend 2 weeks in [place] with family after 8/26 appt.- Due for scans after the 4 cycles of ipi/nivo. ~ around 12/19/20- encouraged to call in the interim with any concerns  30 min were spent in chart review and direct patient contact on the day of the encounter.    Era Bumpers APRN FNP -[facility]

## 2020-11-19 NOTE — Progress Notes
Pt arrived to clinic for treatment with C3 nivo/ipi as scheduled.  Labs drawn, seen in f/u by APRN and cleared for treatment today.  Nursing assessment complete, see flowsheet for details.  Pt offers no new complaints today.  Tolerated nivo over 30 minutes followed by ipi over 30 minutes without difficulty.  PIV flushed and d/c'd, dry dressing applied.  Discharged to home in stable condition, in care of self.  To rtc 8/26 for labs/ov/treatment as scheduled.

## 2020-12-07 ENCOUNTER — Ambulatory Visit: Admit: 2020-12-07 | Payer: BLUE CROSS/BLUE SHIELD

## 2020-12-10 ENCOUNTER — Ambulatory Visit: Admit: 2020-12-10 | Payer: BLUE CROSS/BLUE SHIELD

## 2020-12-10 ENCOUNTER — Telehealth: Admit: 2020-12-10 | Payer: PRIVATE HEALTH INSURANCE

## 2020-12-10 ENCOUNTER — Encounter: Admit: 2020-12-10 | Payer: PRIVATE HEALTH INSURANCE

## 2020-12-10 ENCOUNTER — Inpatient Hospital Stay: Admit: 2020-12-10 | Discharge: 2020-12-10 | Payer: BLUE CROSS/BLUE SHIELD

## 2020-12-10 DIAGNOSIS — C7931 Secondary malignant neoplasm of brain: Secondary | ICD-10-CM

## 2020-12-10 DIAGNOSIS — C4359 Malignant melanoma of other part of trunk: Secondary | ICD-10-CM

## 2020-12-10 DIAGNOSIS — E119 Type 2 diabetes mellitus without complications: Secondary | ICD-10-CM

## 2020-12-10 DIAGNOSIS — E7800 Pure hypercholesterolemia, unspecified: Secondary | ICD-10-CM

## 2020-12-10 DIAGNOSIS — N200 Calculus of kidney: Secondary | ICD-10-CM

## 2020-12-10 LAB — CBC WITH AUTO DIFFERENTIAL
BKR AST/ALT RATIO: 0.78 x 1000/??L (ref 0.00–1.00)
BKR WAM ABSOLUTE IMMATURE GRANULOCYTES.: 0.05 x 1000/??L (ref 0.00–0.30)
BKR WAM ABSOLUTE LYMPHOCYTE COUNT.: 2.3 x 1000/??L (ref 0.60–3.70)
BKR WAM ABSOLUTE NRBC (2 DEC): 0 x 1000/??L (ref 0.00–1.00)
BKR WAM ANALYZER ANC: 6.81 x 1000/??L — ABNORMAL HIGH (ref 2.00–7.60)
BKR WAM BASOPHIL ABSOLUTE COUNT.: 0.08 x 1000/??L (ref 0.00–1.00)
BKR WAM BASOPHILS: 0.8 % (ref 0.0–1.4)
BKR WAM EOSINOPHIL ABSOLUTE COUNT.: 0.58 x 1000/??L (ref 0.00–1.00)
BKR WAM EOSINOPHILS: 5.5 % — ABNORMAL HIGH (ref 0.0–5.0)
BKR WAM HEMATOCRIT (2 DEC): 42.4 % (ref 38.50–50.00)
BKR WAM HEMOGLOBIN: 14.1 g/dL (ref 13.2–17.1)
BKR WAM IMMATURE GRANULOCYTES: 0.5 % (ref 0.0–1.0)
BKR WAM LYMPHOCYTES: 21.7 % (ref 17.0–50.0)
BKR WAM MCH (PG): 29.1 pg (ref 27.0–33.0)
BKR WAM MCHC: 33.3 g/dL (ref 31.0–36.0)
BKR WAM MCV: 87.4 fL (ref 80.0–100.0)
BKR WAM MONOCYTE ABSOLUTE COUNT.: 0.78 x 1000/??L (ref 0.00–1.00)
BKR WAM MONOCYTES: 7.4 % (ref 4.0–12.0)
BKR WAM MPV: 10.4 fL (ref 8.0–12.0)
BKR WAM NUCLEATED RED BLOOD CELLS: 0 % (ref 0.0–1.0)
BKR WAM PLATELETS: 262 x1000/??L (ref 150–420)
BKR WAM RDW-CV: 13.8 % (ref 11.0–15.0)
BKR WAM RED BLOOD CELL COUNT.: 4.85 M/??L (ref 4.00–6.00)
BKR WAM WHITE BLOOD CELL COUNT: 10.6 x1000/??L — ABNORMAL LOW (ref 4.0–11.0)

## 2020-12-10 LAB — COMPREHENSIVE METABOLIC PANEL
BKR A/G RATIO: 1.1 (ref 1.0–2.2)
BKR ALANINE AMINOTRANSFERASE (ALT): 21 U/L (ref 9–59)
BKR ALBUMIN: 3.9 g/dL (ref 3.6–4.9)
BKR ALKALINE PHOSPHATASE: 71 U/L (ref 9–122)
BKR ANION GAP: 10 (ref 7–17)
BKR ASPARTATE AMINOTRANSFERASE (AST): 17 U/L (ref 10–35)
BKR BILIRUBIN TOTAL: 0.4 mg/dL (ref <=1.2)
BKR BLOOD UREA NITROGEN: 23 mg/dL (ref 8–23)
BKR BUN / CREAT RATIO: 26.1 — ABNORMAL HIGH (ref 8.0–23.0)
BKR CALCIUM: 9.2 mg/dL (ref 8.8–10.2)
BKR CHLORIDE: 101 mmol/L (ref 98–107)
BKR CO2: 28 mmol/L (ref 20–30)
BKR CREATININE: 0.88 mg/dL (ref 0.40–1.30)
BKR EGFR, CREATININE (CKD-EPI 2021): >60 mL/min/1.73m2 (ref >=60–1.00)
BKR GLOBULIN: 3.6 g/dL — ABNORMAL HIGH (ref 2.3–3.5)
BKR GLUCOSE: 190 mg/dL — ABNORMAL HIGH (ref 70–100)
BKR POTASSIUM: 4.2 mmol/L (ref 3.3–5.3)
BKR PROTEIN TOTAL: 7.5 g/dL (ref 6.6–8.7)
BKR SODIUM: 139 mmol/L (ref 136–144)
BKR WAM NEUTROPHILS: 26.1 % — ABNORMAL HIGH (ref 8.0–23.0)

## 2020-12-10 LAB — TSH W/REFLEX TO FT4     (BH GH LMW Q YH): BKR THYROID STIMULATING HORMONE: 3.57 [IU]/mL

## 2020-12-10 MED ORDER — EPINEPHRINE 0.3 MG/0.3 ML INJECTION, AUTO-INJECTOR
0.3 mg/ mL | INTRAMUSCULAR | Status: DC | PRN
Start: 2020-12-10 — End: 2020-12-15

## 2020-12-10 MED ORDER — DIPHENHYDRAMINE 50 MG/ML INJECTION (WRAPPED E-RX)
50 mg/mL | Freq: Once | INTRAVENOUS | Status: DC | PRN
Start: 2020-12-10 — End: 2020-12-15

## 2020-12-10 MED ORDER — HYDROCORTISONE SODIUM SUCCINATE 100 MG SOLUTION FOR INJECTION
100 mg | Freq: Once | INTRAVENOUS | Status: DC | PRN
Start: 2020-12-10 — End: 2020-12-15

## 2020-12-10 MED ORDER — DIPHENOXYLATE-ATROPINE 2.5 MG-0.025 MG TABLET
ORAL_TABLET | Freq: Four times a day (QID) | ORAL | 1 refills | Status: AC | PRN
Start: 2020-12-10 — End: ?

## 2020-12-10 MED ORDER — IPILIMUMAB INFUSION >300 MG
Freq: Once | INTRAVENOUS | Status: DC
Start: 2020-12-10 — End: 2020-12-15

## 2020-12-10 MED ORDER — LOPERAMIDE 2 MG TABLET
2 mg | ORAL_TABLET | ORAL | 1 refills | Status: AC
Start: 2020-12-10 — End: 2021-01-14

## 2020-12-10 MED ORDER — NIVOLUMAB INFUSION
Freq: Once | INTRAVENOUS | Status: DC
Start: 2020-12-10 — End: 2020-12-15

## 2020-12-10 MED ORDER — SODIUM CHLORIDE 0.9 % BOLUS (NEW BAG)
0.9 % | Freq: Once | INTRAVENOUS | Status: DC | PRN
Start: 2020-12-10 — End: 2020-12-15

## 2020-12-10 MED ORDER — MEPERIDINE 25 MG/2.5 ML IN 0.9% SODIUM CHLORIDE
Freq: Once | INTRAVENOUS | Status: AC | PRN
Start: 2020-12-10 — End: ?

## 2020-12-10 MED ORDER — ALBUTEROL SULFATE 2.5 MG/3 ML (0.083 %) SOLUTION FOR NEBULIZATION
2.5 mg /3 mL (0.083 %) | RESPIRATORY_TRACT | Status: DC | PRN
Start: 2020-12-10 — End: 2020-12-15

## 2020-12-10 MED ORDER — FAMOTIDINE 4 MG/ML IN 0.9% SODIUM CHLORIDE (ADULT)
Freq: Once | INTRAVENOUS | Status: DC | PRN
Start: 2020-12-10 — End: 2020-12-15

## 2020-12-10 NOTE — Telephone Encounter
Patient said he received a call from [place], and is returning the call about his treatment

## 2020-12-10 NOTE — Telephone Encounter
Spoke to andy reviewed, that I added more stool samples.Reviewed his change of appts times for 9/2 and also reviewed medication lomotil instructions. He was dropping off his stool sample this afternoon Electronically Signed by Bo Mcclintock, APRN, December 10, 2020

## 2020-12-11 ENCOUNTER — Encounter: Admit: 2020-12-11 | Payer: PRIVATE HEALTH INSURANCE

## 2020-12-11 DIAGNOSIS — N200 Calculus of kidney: Secondary | ICD-10-CM

## 2020-12-11 DIAGNOSIS — E119 Type 2 diabetes mellitus without complications: Secondary | ICD-10-CM

## 2020-12-11 DIAGNOSIS — C7931 Secondary malignant neoplasm of brain: Secondary | ICD-10-CM

## 2020-12-11 DIAGNOSIS — C4359 Malignant melanoma of other part of trunk: Secondary | ICD-10-CM

## 2020-12-11 DIAGNOSIS — E7800 Pure hypercholesterolemia, unspecified: Secondary | ICD-10-CM

## 2020-12-11 LAB — ZZZSTOOL FOR WBC     (LMW)

## 2020-12-11 LAB — C. DIFFICILE ASSAY
BKR C. DIFFICILE GDH ANTIGEN: NEGATIVE
BKR C. DIFFICILE RAPID TOXIN: NEGATIVE

## 2020-12-11 NOTE — Progress Notes
NAME:  Jason Rowland (Sep 02, 1945)AGE: 75 y.o. MRN:  [id number] PROVIDER: Era Bumpers APRNDATE OF SERVICE: 12/10/2020 [facility] [place]  REASON FOR VISIT:  Ongoing treatment ONC DX:  Invasive melanoma right mid backSTAGE:  Likely IV (pT4a N3 M1d(0) - originally stage IIIC with 30 mm with microsatellitosis, mitotic rate 2 per mm2).  Noted 09/18/20 to have new brain lesions (too small for biopsy or GKRS).MOLECULAR PROFILING:0% tumor and immune cell PD-L1 staining50-gene panel:  DNA VARIANT DETECTED ? ? 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 ? 74% PRIOR TX:  - WLE and SLNB 03/03/19- Adjuvant Moderna clinical trial (?A Phase 2 Randomized Study of Adjuvant Immunotherapy with the Personalized Cancer Vaccine [phone] and Pembrolizumab Versus Pembrolizumab Alone After Complete Resection of High-Risk Melanoma? Protocol [id number], HIC 78469) C1D1 05/08/19. Last dose 04/27/20. Randomized to the pembrolizumab and vaccine arm.- 07/22/20 resection of recurrent LN in the right upper back (1.6 cm LN with 1.1 cm tumor deposit)ACTIVE TX:   ipilimumab (3 mg/kg) + nivolumab (1 mg/kg) - (10/08/20 - )Onc Hx: [name] (prefers to be called Jason Rowland) is a 75 y.o. male who worked for [employer] (retired at the end of 02/2019 after working there for 50 years) with a PMH of DM2 complicated by peripheral neuropathy, HLD, and AKs who is referred by Joanie Coddington, his dermatologist for a right back metastatic melanoma.  He reports it appeared approx 10 months prior; his PCP originally thought it was a pimple.  Dr. Lorn Junes at [facility] later evaluated it and also thought it was a pimple and injected it with intralesional kenalog x 2 and oral cephalexin without improvement.  The lesion increased in size and burned whenever he leaned against anything.  Denies night sweats and weight loss.  Lucie Leather did a I&D on 01/07/19  with only slight bloody discharge, so a punch biopsy was obtain with path showing metastatic melanoma.  On 01/20/19, additional biopsies were done to try to evaluate a primary site, but we do not have this tissue.  Per patient report, these biopsies were negative. MRI brain 01/30/19 without intracranial disease.  PET done 02/07/19 showing preepiglottic FDG activity s/p ENT evaluation which was negative for any visible malignancy.  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 LDH elevated to 251 at diagnosis. His case was presented at the [facility] tumor board on 02/13/19 and consensus was to ask ENT for random biopsies of the BOT and pre-epiglottic PET-avid areas, as we do not want to miss a head and neck cancer but otherwise not felt to be related to his melanoma.  The cervical LN were not thought to be related.  He should also have gastroenterology evaluation for a PET-avid stranding lesion in the transverse colon.  Dr. Landis Gandy (ENT) performed BOT and vallecula random biopsies 02/20/19 which only showed chronic inflammation and no malignancy.  He had a colonoscopy by Dr. Linton Flemings on 02/27/19 showing only a tubular adenoma in the sigmoid colon.  Overall, it was felt that his right mid back lesion is the site of his primary disease, and the metastatic melanoma pathology interpretation may have been complicated due to repeat I&Ds and steroid injections that may have altered the architecture of the primary lesion.  In the absence of any confirmed metastatic disease, he underwent WLE and LND with Dr. Duanne Moron on 03/03/19.  Final path showed a 30 mm thick melanoma, 2 mitoses/mm2, non-brisk TILs, no tumor regression, LVI+, microsatellites+, negative margins, and 3/35 lymph nodes (largest LN  4.5 x 3 cm) were positive for melanoma with presence of lymphatic invasion without extranodal extension.He uses a cane sometimes as he has some balance issues due to neuropathy in his feet.  Otherwise, he is fully functional, active, and independent in ADLs/iADLs.  No hx of autoimmune disease.03/27/19 signed consent for the Moderna trial; randomized to pembro and vaccine arm.  Started treatment C1D1 05/08/19.04/24/19 noted to have wound dehiscence, non-infected, healing by secondary intent.  Re-instated VNA services to assist with dressing changes and packing.  Wound completely healed by 06/20/19.10/27/19, 01/12/20, and 04/24/20 surveillance scans NED.07/05/20 - [place] chest showing an increased 1.0 cm subcutaneous nodule in the right upper back.  Bx 07/06/20 not diagnostic.  Excision by Dr. Duanne Moron 07/22/20 showed metastatic melanoma involving 1 LN with 11 mm tumor deposit in a 1.6 cm LN.   09/18/20 - MRI brain showing a 8 mm left hippocampus lesion and a 6 mm lesion in the left mesial temporal lobe.  Reports new subtle memory issues.Interval WG:NFAO returns for follow-up and cycle 4ipi/nivo. Today andy reports 4 day history of soft stools and diarrhea, average of 3 episodes daily. He noted he had a loose stool/diarrhea any time he ate. He had one episode where he was not able to make it to the bathroom in time.he denies any blood in his stool, no n/v or abdominal pain.  He was able to get a Libre needle; At last visit he Reported allergic eye issues, had rx pataday drops however ran out. Was using visine prn. Today he reports his eyes feeling fuzzy, and sees black dots intermittently, usually able to refocus. Denies HAs, dizziness, white spots  Very seldom episodes of dairrhea, usually isolated to dietary sensitivites. For example yesterday had Diarrhea - 3 episodes  Few hours after eating lobster bique, potato with butter and broccoli. Jason Rowland knows to call if the diarrhea is daily and more than 3 episodes. Wound to  left shin has healed+ 1 chronic edema noted to b/l LE. Also reports right upper lymphedema to be more noticeable lately especially to the right anterior axillary  chest wall. No masses or lesions appreciated. Swelling to soft tissue noted. Suggested lymphedema sleeve, he declined at this time. Chronic  eczema rash around umbilical area not causing pain, not itchy. He has cream that he inconsistently places on the rash. ECOG:  1Pain: 0/10Review of Systems:No fevers, chills, night sweats, lightheadedness, HA, CP, palpitations, wheezing, abd pain, constipation, blood in the urine or stools, dysuria.  Diabetic neuropathy in the hands and feet stable.   Positive responses on ROS as noted in interval hx. PAST MEDICAL HISTORY:Past Medical History: Diagnosis Date ? Diabetes mellitus (HC Code) (HC CODE) (HC Code)  ? High cholesterol  ? Kidney stones   40 years ago ? 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 Malignant melanoma of torso excluding breast (HC Code)  SURGICAL HISTORY:Past Surgical History: Procedure Laterality Date ? COLONOSCOPY   ? CORONARY ANGIOPLASTY WITH STENT PLACEMENT Bilateral   patient denies ? laryngoscopy with biopsy  02/20/2019 ? ROTATOR CUFF REPAIR Left 1999 ? TONSILLECTOMY    age 53 ? TOTAL HIP ARTHROPLASTY Right 2002 FAMILY HISTORY:Family History Problem Relation Age of Onset ? Heart disease Mother  ? Bladder cancer Father 28 ? Kidney cancer Sister  ? Leukemia Brother 55 ? No Known Problems Daughter  ? No Known Problems Son  SOCIAL HISTORY:Social History Socioeconomic History ? Marital status:  Divorced   Spouse name: Not on file ? Number of children: Not on file ? Years of education: Not on file ? Highest education level: Not on file Occupational History ? Not on file Tobacco Use ? Smoking status: Never Smoker ? Smokeless tobacco: Never Used Vaping Use ? Vaping Use: Never used Substance and Sexual Activity ? Alcohol use: Not Currently ? Drug use: No ? Sexual activity: Not on file Other Topics Concern ? Not on file Social History Narrative  Divorced, works for [employer] in [place] will be retiring 03/17/19.  Has 1 son in [place] and 1 daughter in [place]. Llives in [place]  Social Determinants of Health Financial Resource Strain: Not on file Food Insecurity: Not on file Transportation Needs: Not on file Physical Activity: Not on file Stress: Not on file Social Connections: Not on file Intimate Partner Violence: Not on file Housing Stability: Not on file ALLERGIES:No Known AllergiesMEDICATIONS:Current Outpatient Medications Medication Sig ? acetaminophen (TYLENOL) 325 mg tablet Take 3 tablets (975 mg total) by mouth every 6 (six) hours as needed (mild to moderate pain). (Patient not taking: Reported on 11/19/2020) ? ALPRAZolam (XANAX) 0.5 mg tablet Take 0.5 mg by mouth as needed. ? aspirin 81 MG EC tablet Take 81 mg by mouth nightly.  ? docusate sodium (COLACE) 250 mg capsule Take 1 capsule (250 mg total) by mouth 2 (two) times daily as needed for constipation. (Patient not taking: Reported on 11/19/2020) ? FREESTYLE LIBRE 14 DAY sensor kit 1 each by Other route every 14 (fourteen) days. ? glipiZIDE (GLUCOTROL XL) 10 MG 24 hr tablet Take 10 mg by mouth nightly.  ? ibuprofen (ADVIL,MOTRIN) 200 mg tablet Take 2 tablets (400 mg total) by mouth every 6 (six) hours as needed (alternate with tylenol for mild to moderate pain). ? indomethacin (INDOCIN) 25 mg capsule 3 (three) times daily. (Patient not taking: Reported on 11/19/2020) ? levothyroxine (SYNTHROID, LEVOTHROID) 50 MCG tablet Take 50 mcg by mouth daily. ? olopatadine (PATANOL) 0.1 % ophthalmic solution as needed (takes for eye irritation as needed). (Patient not taking: Reported on 11/19/2020) ? simvastatin (ZOCOR) 20 MG tablet Take 20 mg by mouth nightly.  ? TOUJEO SOLOSTAR U-300 300 unit/mL (1.5 mL) pen Inject 70 Units under the skin nightly. ? triamcinolone (KENALOG) 0.1 % ointment Apply topically 2 (two) times daily as needed. To areas of rash. (Patient not taking: Reported on 11/19/2020) ? vit A/vit C/vit E/zinc/copper (PRESERVISION AREDS ORAL) Take by mouth 2 (two) times daily. ? zolpidem (AMBIEN) 10 mg tablet nightly.. No current facility-administered medications for this visit. VITALS: BP (!) 147/83  - Pulse 84  - Temp 98 ?F (36.7 ?C) (Temporal)  - Resp 18  - Wt 122 kg  - SpO2 98%  - BMI 38.05 kg/m?  PHYSICAL EXAM:Gen:  NAD, pleasant HEENT:  EOMI, PEARLLN:  No cervical, supraclavicular, axillary, or inguinal LAD. CV:  No murmurs, mild tachy, no m/r/g Pulm:  CTA b/lAbd:  Soft, no tenderness, no guarding or rebound, no organomegaly, no ascitesExt:  WWP, 1+ symmetric edema in the ankles.  Lymphedema present in the right arm, unchanged.Neuro:   No focal deficitsPsych:  Normal mood and affectSkin:  Scar on the right upper back well healed without evidence of recurrence. Small lateral skin puckering at the edges of the scar stable.  Flaking skin/dryness in the middle portion of the scar.  Right axillary scar well healed.  Scar from LN recurrence well healed. Eczema to  umbilical areaLABS:Results for orders placed or performed in visit  on 11/19/20 TSH w/reflex to FT4     (BH GH LMW Q YH) Result Value Ref Range  Thyroid Stimulating Hormone 5.000 (H) See Comment ?IU/mL CBC auto differential Result Value Ref Range  WBC 9.1 4.0 - 11.0 x1000/?L  RBC 4.82 4.00 - 6.00 M/?L  Hemoglobin 14.1 13.2 - 17.1 g/dL  Hematocrit 27.25 36.64 - 50.00 %  MCV 88.2 80.0 - 100.0 fL  MCH 29.3 27.0 - 33.0 pg  MCHC 33.2 31.0 - 36.0 g/dL  RDW-CV 40.3 47.4 - 25.9 %  Platelets 222 150 - 420 x1000/?L  MPV 10.7 8.0 - 12.0 fL  Neutrophils 63.2 39.0 - 72.0 %  Lymphocytes 23.7 17.0 - 50.0 %  Monocytes 6.8 4.0 - 12.0 %  Eosinophils 5.0 0.0 - 5.0 %  Basophil 0.9 0.0 - 1.4 %  Immature Granulocytes 0.4 0.0 - 1.0 %  nRBC 0.0 0.0 - 1.0 %  ANC(Abs Neutrophil Count) 5.77 2.00 - 7.60 x 1000/?L  Absolute Lymphocyte Count 2.16 0.60 - 3.70 x 1000/?L  Monocyte Absolute Count 0.62 0.00 - 1.00 x 1000/?L  Eosinophil Absolute Count 0.46 0.00 - 1.00 x 1000/?L  Basophil Absolute Count 0.08 0.00 - 1.00 x 1000/?L  Absolute Immature Granulocyte Count 0.04 0.00 - 0.30 x 1000/?L  Absolute nRBC 0.00 0.00 - 1.00 x 1000/?L Comprehensive metabolic panel Result Value Ref Range  Sodium 136 136 - 144 mmol/L  Potassium 4.6 3.3 - 5.3 mmol/L  Chloride 99 98 - 107 mmol/L  CO2 27 20 - 30 mmol/L  Anion Gap 10 7 - 17  Glucose 179 (H) 70 - 100 mg/dL  BUN 20 8 - 23 mg/dL  Creatinine 5.63 8.75 - 1.30 mg/dL  Calcium 9.4 8.8 - 64.3 mg/dL  BUN/Creatinine Ratio 32.9 8.0 - 23.0  Total Protein 7.4 6.6 - 8.7 g/dL  Albumin 4.1 3.6 - 4.9 g/dL  Total Bilirubin 0.3 <=5.1 mg/dL  Alkaline Phosphatase 65 9 - 122 U/L  Alanine Aminotransferase (ALT) 18 9 - 59 U/L  Aspartate Aminotransferase (AST) 18 10 - 35 U/L  Globulin 3.3 2.3 - 3.5 g/dL  A/G Ratio 1.2 1.0 - 2.2  AST/ALT Ratio 1.0 See Comment  eGFR (Afr Amer) >60 >60 mL/min/1.2m2  eGFR (NON African-American) >60 >60 mL/min/1.29m2 T4, free Result Value Ref Range  Free T4 1.05 See Comment ng/dL IMAGING/PATH:No results found. 07/22/20: SOFT TISSUE, RIGHT UPPER BACK, EXCISION: ? ? ? ?- METASTATIC MELANOMA INVOLVING A LYMPH NODE (1/1) ? ? ? ? ? - SIZE OF THE LARGEST TUMOR DEPOSIT: 11 MM ? ? ? ? ? - SIZE OF THE LYMPH NODE: 1.6 CM WLE and SLN 03/03/19:LYMPH NODES, RIGHT AXILLARY LEVELS 1, 2 AND 3, LYMPH NODE DISSECTION: ?  ? ? - ?THREE OF THIRTY-FIVE LYMPH NODES, POSITIVE FOR MELANOMA (3 OF 35) WITH LARGEST INVOLVED LYMPH NODE MEASURING 4.5 X 3 CM ? ? ?- LYMPHATIC INVASION IDENTIFIED ? ? ?- NO EXTRANODAL EXTENSION IDENTIFIED 1. ?SKIN, BACK, RIGHT UPPER, EXCISION: ? ? ? ?- MALIGNANT MELANOMA, SEE NOTE AND SYNOPTIC SUMMARY Note: Sections show a tumor composed of S100 positive epithelioid cells within the dermis and subcutaneous tissue. Cytokeratin AE1/AE3 and desmin are negative. A junctional component is not identified. The findings would be compatible with a primary dermal melanoma or a metastatic lesion. Clinical correlation is suggested. 2. ?SKIN, BACK, EXCISION: ? ? ? ?- BENIGN FIBROADIPOSE TISSUE AND SKELETAL MUSCLE SYNOPTIC SUMMARY MELANOMA OF THE SKIN Tumor Site: ? ? Back Laterality: ? ? Right Procedure: ? ? Primary excision Maximum Tumor Thickness (  Depth): ? ? 30 mm Ulceration: ? ? Not identified Mitotic Rate (Mitoses/mm2): ? ? 2 Anatomic Level: ? ? V (Melanoma invades subcutis) Growth Phase: ? ? Vertical Histologic Type: ? ? Melanoma, NOS Tumor-Infiltrating Lymphocytes: ? ? Present, non-brisk Tumor Regression: ? ? Not identifed Lymphovascular Invasion: ? ? Present Microsatellitosis: ? ? Present Margins ? ? ?Peripheral Margins: ? ? Uninvolved Deep Margin: ? ? Uninvolved Stage (AJCC 8th Ed): ? ? pT4a Nx Base of the tongue and vallecula random biopsies 02/20/19: 1. ?TONGUE, LEFT, BASE, BIOPSY: ? ? ? ?- LYMPHOID TISSUE HYPERPLASIA ? ? ?- NEGATIVE FOR MALIGNANCY 2. ?THROAT, VALLECULA, BIOPSY: ? ?  ? - LYMPHOID TISSUE HYPERPLASIA AND FOCAL CHRONIC INFLAMMATION. SEE NOTE. ? ? ?- NEGATIVE FOR MALIGNANCY ? ? ? ? ? Note: Immunostain of Sox-10 to investigate focal pigment is negative, supporting above interpretation. 3. ?THROAT, LEFT VALLECULA, BIOPSY: ? ? ? ?- LYMPHOID TISSUE HYPERPLASIA ? ? ?- NEGATIVE FOR MALIGNANCY 4. ?THROAT, RIGHT VALLECULA, BIOPSY: ? ? ? ? ? ? - LYMPHOID TISSUE HYPERPLASIA AND FOCAL CHRONIC INFLAMMATION ? ? ?- NEGATIVE FOR MALIGNANCY 5. ?TONGUE, RIGHT BASE, BIOPSY: ? ? ? ? ? ? - LYMPHOID TISSUE HYPERPLASIA AND FOCAL CHRONIC INFLAMMATION ? ? ?- NEGATIVE FOR MALIGNANCY Pathology (reviewed at [facility]), collected 01/07/19:DIAGNOSIS: ? 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 Clinical pathological correlation is recommended. MICROSCOPIC DESCRIPTION: There are atypical cells in a nodule in the dermis. The cells are positive with SOX-10 and by report are negative with LCA, Kappa, Lambda, and a cytokeratin. The cells are also positive with S-100 by report.ASSESSMENT/PLAN:[name] is a 75 y.o. 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 Duanne Moron on 03/03/19 showing a stage IIIC melanoma that was 30 mm thick, positive for LVI and microsatellites with 3/25 positive LNs.  Given the thickness of his primary melanoma and microsatellitosis, we felt that his melanoma-specific survival is closer to a stage IIID:  60% over 10 years with stage IIIC disease versus 24% with stage IIID.  He opted to proceed with the Moderna clinical trial (?A Phase 2 Randomized Study of Adjuvant Immunotherapy with the Personalized Cancer Vaccine [phone] and Pembrolizumab Versus Pembrolizumab Alone After Complete Resection of High-Risk Melanoma?) and completed 1 year of adjuvant therapy on trial without issues.  He developed an enlarged 1 cm SQ nodule overlying the right scapula noted on scans 07/05/20 s/p excision showing a 1.6 cm LN containing a 11 mm deposit of melanoma.MRI brain 09/18/20 showed a 8 mm left hippocampus lesion and a 6 mm lesion in the left mesial temporal lobe concerning for brain mets.  Reviewed the case with Dr. Winfred Leeds; lesions are too small and ill-defined to biopsy and would hold off on GKRS for now.  Given high suspicion for metastatic disease, recurrence of his disease recently, and fairly good performance status, would opt to be aggressive.  I reviewed with Jason Rowland that ideally, we would want to get a biopsy to establish stage IV disease, but given that we know his disease is aggressive and presented again shortly after discontinuation of adjuvant anti-PD-1, we can presume that this is metastatic disease and start a new line of systemic therapy.  Discussed the available brain met clinical trials that he is currently on, but he wants to move to [facility] and  would like the pursue standard of care. Discussed data from [employer] utilizing ipi 3 + nivo 1 in brain metastasis which demonstrated similar intracranial and extracranial efficacy.  Jason Rowland agreed to proceed.  SEs again reviewed.  Paper consent signed today.  Here today for cycle 4 Ipi/Nivo - will hold tx today r/t diarrhea- stool samples ordered, he will drop off the sample at the [facility] [place] [facility]. Imodium and Lomitol rx provided. Reviewed imodium 2 tablets in the am and with each BM 1 tablet - max of 16mg . Lomotil if diarrhea persists.Jason Rowland reports that the imodium in the past caused constipation and he feels 1 tablet in the am will likely be sufficient. Reviewed BRAT diet as well- eye changes: I called an made an urgent appt for his local eye doctor. - labs reviewed and unremarkable. TSH 3.570 - RTC in 1week for next cycle for last cycle of ipi/nivo and then will switch to nivo monotherapy.- hoping to spend 2 weeks in [place] with family in between his treatment cycles- Due for scans after the 4 cycles of ipi/nivo. ~ around 12/19/20- routine f/u with dermatologist every 4-6 months.- encouraged to call in the interim with any concerns  38 min were spent in chart review and direct patient contact on the day of the encounter.    Era Bumpers APRN FNP -BCDiscussed the case with Dr. Laveda Norman and Dr. Laveda Norman will not bill for services related to this encounte

## 2020-12-13 ENCOUNTER — Telehealth: Admit: 2020-12-13 | Payer: PRIVATE HEALTH INSURANCE

## 2020-12-13 NOTE — Telephone Encounter
Called Jason Rowland to let him know that this stool sample was negative for cdiff and wbc , calprolactin still in processSaturday he had 3- 4 BM, some formed stool some liquid. Sunday - 6 BMHe feels the imodium will cause constipaton and does not like to take it.yestersay he took lomotil has rxed.Re educated on the instructions of imodium and lomotil. Had his eye exam today and he reported no concerning findings. Will call Jason Rowland tomorrow or Wednesday to see how he is feeling Electronically Signed by Bo Mcclintock, APRN, August 29, 2022He has the clinic number if he needs [place] Electronically Signed by Bo Mcclintock, APRN, December 13, 2020

## 2020-12-14 ENCOUNTER — Telehealth: Admit: 2020-12-14 | Payer: PRIVATE HEALTH INSURANCE

## 2020-12-14 NOTE — Telephone Encounter
Jason Rowland is feeling better, had one small semit formed BM today. He took one imodium upon waking and then a lomitol after the BM.[place] and feels overall better today.Instructed him to take the imodium in the am, and then only take lomotil if he has a BM continued and not controlled with the imodium. He verbalized understanding .Electronically Signed by Bo Mcclintock, APRN, December 14, 2020

## 2020-12-14 NOTE — Telephone Encounter
Called Jason Rowland on house and cell left vm, asking him to call clinic if he has any concerns. Wanted to see how he was feeling and if he was having any further episodes of diarrhea >Also called the optho md from his visit to send over office note Electronically Signed by Bo Mcclintock, APRN, December 14, 2020

## 2020-12-17 ENCOUNTER — Encounter: Admit: 2020-12-17 | Payer: PRIVATE HEALTH INSURANCE

## 2020-12-17 ENCOUNTER — Ambulatory Visit: Admit: 2020-12-17 | Payer: BLUE CROSS/BLUE SHIELD

## 2020-12-17 ENCOUNTER — Inpatient Hospital Stay: Admit: 2020-12-17 | Discharge: 2020-12-17 | Payer: BLUE CROSS/BLUE SHIELD

## 2020-12-17 DIAGNOSIS — C7931 Secondary malignant neoplasm of brain: Secondary | ICD-10-CM

## 2020-12-17 DIAGNOSIS — Z79890 Hormone replacement therapy (postmenopausal): Secondary | ICD-10-CM

## 2020-12-17 DIAGNOSIS — Z7982 Long term (current) use of aspirin: Secondary | ICD-10-CM

## 2020-12-17 DIAGNOSIS — Z5112 Encounter for antineoplastic immunotherapy: Secondary | ICD-10-CM

## 2020-12-17 DIAGNOSIS — C4359 Malignant melanoma of other part of trunk: Secondary | ICD-10-CM

## 2020-12-17 DIAGNOSIS — Z7984 Long term (current) use of oral hypoglycemic drugs: Secondary | ICD-10-CM

## 2020-12-17 DIAGNOSIS — Z79899 Other long term (current) drug therapy: Secondary | ICD-10-CM

## 2020-12-17 DIAGNOSIS — Z8052 Family history of malignant neoplasm of bladder: Secondary | ICD-10-CM

## 2020-12-17 DIAGNOSIS — Z8249 Family history of ischemic heart disease and other diseases of the circulatory system: Secondary | ICD-10-CM

## 2020-12-17 DIAGNOSIS — E1140 Type 2 diabetes mellitus with diabetic neuropathy, unspecified: Secondary | ICD-10-CM

## 2020-12-17 DIAGNOSIS — Z8051 Family history of malignant neoplasm of kidney: Secondary | ICD-10-CM

## 2020-12-17 DIAGNOSIS — E7800 Pure hypercholesterolemia, unspecified: Secondary | ICD-10-CM

## 2020-12-17 LAB — CBC WITH AUTO DIFFERENTIAL
BKR WAM ABSOLUTE IMMATURE GRANULOCYTES.: 0.06 x 1000/??L (ref 0.00–0.30)
BKR WAM ABSOLUTE LYMPHOCYTE COUNT.: 2.27 x 1000/??L — ABNORMAL LOW (ref 0.60–3.70)
BKR WAM ABSOLUTE NRBC (2 DEC): 0 x 1000/??L (ref 0.00–1.00)
BKR WAM ANALYZER ANC: 8.08 x 1000/??L — ABNORMAL HIGH (ref 2.00–7.60)
BKR WAM BASOPHIL ABSOLUTE COUNT.: 0.06 x 1000/??L (ref 0.00–1.00)
BKR WAM BASOPHILS: 0.5 % (ref 0.0–1.4)
BKR WAM EOSINOPHIL ABSOLUTE COUNT.: 0.67 x 1000/??L (ref 0.00–1.00)
BKR WAM EOSINOPHILS: 5.6 % — ABNORMAL HIGH (ref 0.0–5.0)
BKR WAM HEMATOCRIT (2 DEC): 42.5 % (ref 38.50–50.00)
BKR WAM HEMOGLOBIN: 14.2 g/dL (ref 13.2–17.1)
BKR WAM IMMATURE GRANULOCYTES: 0.5 % (ref 0.0–1.0)
BKR WAM LYMPHOCYTES: 18.8 % (ref 17.0–50.0)
BKR WAM MCH (PG): 29.2 pg (ref 27.0–33.0)
BKR WAM MCHC: 33.4 g/dL (ref 31.0–36.0)
BKR WAM MCV: 87.4 fL (ref 80.0–100.0)
BKR WAM MONOCYTE ABSOLUTE COUNT.: 0.91 x 1000/ÂµL (ref 0.00–1.00)
BKR WAM MONOCYTES: 7.6 % (ref 4.0–12.0)
BKR WAM MPV: 10.6 fL (ref 8.0–12.0)
BKR WAM NEUTROPHILS: 67 % (ref 39.0–72.0)
BKR WAM NUCLEATED RED BLOOD CELLS: 0 % (ref 0.0–1.0)
BKR WAM PLATELETS: 254 x1000/ÂµL (ref 150–420)
BKR WAM RDW-CV: 13.6 % (ref 11.0–15.0)
BKR WAM RED BLOOD CELL COUNT.: 4.86 M/??L (ref 4.00–6.00)
BKR WAM WHITE BLOOD CELL COUNT: 12.1 x1000/??L — ABNORMAL HIGH (ref 4.0–11.0)

## 2020-12-17 LAB — COMPREHENSIVE METABOLIC PANEL
BKR A/G RATIO: 0.9 — ABNORMAL LOW (ref 1.0–2.2)
BKR ALANINE AMINOTRANSFERASE (ALT): 15 U/L (ref 9–59)
BKR ALBUMIN: 3.7 g/dL (ref 3.6–4.9)
BKR ALKALINE PHOSPHATASE: 78 U/L (ref 9–122)
BKR ANION GAP: 12 (ref 7–17)
BKR ASPARTATE AMINOTRANSFERASE (AST): 17 U/L — ABNORMAL HIGH (ref 10–35)
BKR AST/ALT RATIO: 1.1 x 1000/??L (ref 0.00–1.00)
BKR BILIRUBIN TOTAL: 0.4 mg/dL (ref <=1.2)
BKR BLOOD UREA NITROGEN: 15 mg/dL (ref 8–23)
BKR BUN / CREAT RATIO: 18.1 (ref 8.0–23.0)
BKR CALCIUM: 8.9 mg/dL (ref 8.8–10.2)
BKR CHLORIDE: 100 mmol/L (ref 98–107)
BKR CO2: 28 mmol/L (ref 20–30)
BKR CREATININE: 0.83 mg/dL (ref 0.40–1.30)
BKR EGFR, CREATININE (CKD-EPI 2021): >60 mL/min/{1.73_m2} (ref >=60)
BKR GLOBULIN: 3.9 g/dL — ABNORMAL HIGH (ref 2.3–3.5)
BKR GLUCOSE: 198 mg/dL — ABNORMAL HIGH (ref 70–100)
BKR POTASSIUM: 4 mmol/L (ref 3.3–5.3)
BKR PROTEIN TOTAL: 7.6 g/dL (ref 6.6–8.7)
BKR SODIUM: 140 mmol/L (ref 136–144)

## 2020-12-17 LAB — TSH W/REFLEX TO FT4     (BH GH LMW Q YH): BKR THYROID STIMULATING HORMONE: 4.77 ??IU/mL — ABNORMAL HIGH

## 2020-12-17 LAB — T4, FREE: BKR FREE T4: 1.01 ng/dL

## 2020-12-17 MED ORDER — EPINEPHRINE 0.3 MG/0.3 ML INJECTION, AUTO-INJECTOR
0.30.3 mg/ mL | INTRAMUSCULAR | Status: DC | PRN
Start: 2020-12-17 — End: 2020-12-17

## 2020-12-17 MED ORDER — MEPERIDINE 25 MG/2.5 ML IN 0.9% SODIUM CHLORIDE
Freq: Once | INTRAVENOUS | Status: DC | PRN
Start: 2020-12-17 — End: 2020-12-17

## 2020-12-17 MED ORDER — IPILIMUMAB INFUSION >300 MG
Freq: Once | INTRAVENOUS | Status: CP
Start: 2020-12-17 — End: ?
  Administered 2020-12-17: 16:00:00 250.000 mL/h via INTRAVENOUS

## 2020-12-17 MED ORDER — HYDROCORTISONE SODIUM SUCCINATE 100 MG SOLUTION FOR INJECTION
100 mg | Freq: Once | INTRAVENOUS | Status: DC | PRN
Start: 2020-12-17 — End: 2020-12-17

## 2020-12-17 MED ORDER — DIPHENHYDRAMINE 50 MG/ML INJECTION (WRAPPED E-RX)
50 mg/mL | Freq: Once | INTRAVENOUS | Status: DC | PRN
Start: 2020-12-17 — End: 2020-12-17

## 2020-12-17 MED ORDER — SODIUM CHLORIDE 0.9 % BOLUS (NEW BAG)
0.9 % | Freq: Once | INTRAVENOUS | Status: DC | PRN
Start: 2020-12-17 — End: 2020-12-17

## 2020-12-17 MED ORDER — NIVOLUMAB INFUSION
Freq: Once | INTRAVENOUS | Status: CP
Start: 2020-12-17 — End: ?
  Administered 2020-12-17: 16:00:00 50.000 mL/h via INTRAVENOUS

## 2020-12-17 MED ORDER — PREDNISONE 20 MG TABLET
20 mg | ORAL_TABLET | Freq: Every day | ORAL | 1 refills | Status: AC
Start: 2020-12-17 — End: 2022-03-17

## 2020-12-17 MED ORDER — ALBUTEROL SULFATE 2.5 MG/3 ML (0.083 %) SOLUTION FOR NEBULIZATION
2.530.083 mg /3 mL (0.083 %) | RESPIRATORY_TRACT | Status: DC | PRN
Start: 2020-12-17 — End: 2020-12-17

## 2020-12-17 MED ORDER — FAMOTIDINE 4 MG/ML IN 0.9% SODIUM CHLORIDE (ADULT)
Freq: Once | INTRAVENOUS | Status: DC | PRN
Start: 2020-12-17 — End: 2020-12-17

## 2020-12-17 MED ORDER — PREDNISONE 50 MG TABLET
50 mg | ORAL_TABLET | Freq: Every day | ORAL | 1 refills | Status: AC
Start: 2020-12-17 — End: 2020-12-17

## 2020-12-17 NOTE — Progress Notes
Pt arrived to clinic for C4 nivo/ipi as scheduled.  Labs sent peripherally and seen in f/u by APRN, cleared for treatment today.  Nursing assessment complete, see flowsheet for details.  Pt offers no new complaints.  Treatment delayed last week d/t diarrhea, pt reports resolution of symptoms and had normal BM yesterday.  Tolerated nivo over 30 minutes followed by ipi over 30 minutes without difficulty.  PIV flushed and d/c'd, dry dressing applied.  Discharged to home in stable condition, in care of self.  To rtc in 4 weeks for labs/ov/treatment with nivo alone.

## 2020-12-18 ENCOUNTER — Encounter: Admit: 2020-12-18 | Payer: PRIVATE HEALTH INSURANCE

## 2020-12-18 DIAGNOSIS — E7800 Pure hypercholesterolemia, unspecified: Secondary | ICD-10-CM

## 2020-12-18 DIAGNOSIS — C7931 Secondary malignant neoplasm of brain: Secondary | ICD-10-CM

## 2020-12-18 DIAGNOSIS — N200 Calculus of kidney: Secondary | ICD-10-CM

## 2020-12-18 DIAGNOSIS — C4359 Malignant melanoma of other part of trunk: Secondary | ICD-10-CM

## 2020-12-18 DIAGNOSIS — E119 Type 2 diabetes mellitus without complications: Secondary | ICD-10-CM

## 2020-12-18 LAB — CALPROTECTIN, STOOL: CALPROTECTIN: 61 mcg/g

## 2020-12-18 NOTE — Progress Notes
NAME:  Jason Rowland ([birth year])AGE: 75 y.o. MRN:  [id number] PROVIDER: Era Bumpers APRNDATE OF SERVICE: 12/17/2020 [facility] [place]  REASON FOR VISIT:  Ongoing treatment ONC DX:  Invasive melanoma right mid backSTAGE:  Likely IV (pT4a N3 M1d(0) - originally stage IIIC with 30 mm with microsatellitosis, mitotic rate 2 per mm2).  Noted 09/18/20 to have new brain lesions (too small for biopsy or GKRS).MOLECULAR PROFILING:0% tumor and immune cell PD-L1 staining50-gene panel:  DNA VARIANT DETECTED ? ? 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 ? 74% PRIOR TX:  - WLE and SLNB 03/03/19- Adjuvant Moderna clinical trial (?A Phase 2 Randomized Study of Adjuvant Immunotherapy with the Personalized Cancer Vaccine [phone] and Pembrolizumab Versus Pembrolizumab Alone After Complete Resection of High-Risk Melanoma? Protocol [id number], HIC 47829) C1D1 05/08/19. Last dose 04/27/20. Randomized to the pembrolizumab and vaccine arm.- 07/22/20 resection of recurrent LN in the right upper back (1.6 cm LN with 1.1 cm tumor deposit)ACTIVE TX:   ipilimumab (3 mg/kg) + nivolumab (1 mg/kg) - (10/08/20 - )Onc Hx: Jason Rowland (prefers to be called Jason Rowland) is a 75 y.o. male who worked for [employer] (retired at the end of 02/2019 after working there for 50 years) with a PMH of DM2 complicated by peripheral neuropathy, HLD, and AKs who is referred by Joanie Coddington, his dermatologist for a right back metastatic melanoma.  He reports it appeared approx 10 months prior; his PCP originally thought it was a pimple.  Dr. Lorn Junes at [facility] later evaluated it and also thought it was a pimple and injected it with intralesional kenalog x 2 and oral cephalexin without improvement.  The lesion increased in size and burned whenever he leaned against anything.  Denies night sweats and weight loss.  Lucie Leather did a I&D on 01/07/19  with only slight bloody discharge, so a punch biopsy was obtain with path showing metastatic melanoma.  On 01/20/19, additional biopsies were done to try to evaluate a primary site, but we do not have this tissue.  Per patient report, these biopsies were negative. MRI brain 01/30/19 without intracranial disease.  PET done 02/07/19 showing preepiglottic FDG activity s/p ENT evaluation which was negative for any visible malignancy.  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 LDH elevated to 251 at diagnosis. His case was presented at the [facility] Melanoma tumor board on 02/13/19 and consensus was to ask ENT for random biopsies of the BOT and pre-epiglottic PET-avid areas, as we do not want to miss a head and neck cancer but otherwise not felt to be related to his melanoma.  The cervical LN were not thought to be related.  He should also have gastroenterology evaluation for a PET-avid stranding lesion in the transverse colon.  Dr. Landis Gandy (ENT) performed BOT and vallecula random biopsies 02/20/19 which only showed chronic inflammation and no malignancy.  He had a colonoscopy by Dr. Linton Flemings on 02/27/19 showing only a tubular adenoma in the sigmoid colon.  Overall, it was felt that his right mid back lesion is the site of his primary disease, and the metastatic melanoma pathology interpretation may have been complicated due to repeat I&Ds and steroid injections that may have altered the architecture of the primary lesion.  In the absence of any confirmed metastatic disease, he underwent WLE and LND with Dr. Duanne Moron on 03/03/19.  Final path showed a 30 mm thick melanoma, 2 mitoses/mm2, non-brisk TILs, no tumor regression, LVI+, microsatellites+, negative margins, and 3/35 lymph nodes (largest LN  4.5 x 3 cm) were positive for melanoma with presence of lymphatic invasion without extranodal extension.He uses a cane sometimes as he has some balance issues due to neuropathy in his feet.  Otherwise, he is fully functional, active, and independent in ADLs/iADLs.  No hx of autoimmune disease.03/27/19 signed consent for the Moderna trial; randomized to pembro and vaccine arm.  Started treatment C1D1 05/08/19.04/24/19 noted to have wound dehiscence, non-infected, healing by secondary intent.  Re-instated VNA services to assist with dressing changes and packing.  Wound completely healed by 06/20/19.10/27/19, 01/12/20, and 04/24/20 surveillance scans NED.07/05/20 - [place] chest showing an increased 1.0 cm subcutaneous nodule in the right upper back.  Bx 07/06/20 not diagnostic.  Excision by Dr. Duanne Moron 07/22/20 showed metastatic melanoma involving 1 LN with 11 mm tumor deposit in a 1.6 cm LN.   09/18/20 - MRI brain showing a 8 mm left hippocampus lesion and a 6 mm lesion in the left mesial temporal lobe.  Reports new subtle memory issues.Interval UJ:WJXB returns for follow-up and cycle 4 ipi/nivo, which was previously delayed due to diarrhea. Today andy reports in the past week he has continue to have loose stools and diarrhea, however in the past 4 days he has had an average of 3 episodes daily. Denies any stool incontinence or urgency. He has been taken lomotil scheduled 4x per day and then imodium prn with each stool. Stool sample dropped off last week was negative for wbc, and cdiff, the procalcitonin was still pending Denies any abdominal n/v. Still eating a regular and not avoiding spicy or fried food.On Monday he had an urgent appt with his local eye doctor to r/o uveitis - the report noted  anterior chamber and it was normal. He believes his eye sxs are improving Chronic  eczema rash around umbilical area not causing pain, not itchy. He has cream that he inconsistently places on the rash. He has small circular non pruitic rash to right hand one spot on each lower leg. He has tac cream at home and instructed to use on these areas. He has routine derm appt next week and will address this as well. Dry skin noted to scar on his back. Asked to apply a fragrance free moisturizer.Jason Rowland is hoping to travel to [place] soon and spent at least two weeks there. ECOG:  1Pain: 0/10Review of Systems:No fevers, chills, night sweats, lightheadedness, HA, CP, palpitations, wheezing, abd pain, constipation, blood in the urine or stools, dysuria.  Diabetic neuropathy in the hands and feet stable.   Positive responses on ROS as noted in interval hx. PAST MEDICAL HISTORY:Past Medical History: Diagnosis Date ? Diabetes mellitus (HC Code) (HC CODE) (HC Code)  ? High cholesterol  ? Kidney stones   40 years ago ? 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 Malignant melanoma of torso excluding breast (HC Code)  SURGICAL HISTORY:Past Surgical History: Procedure Laterality Date ? COLONOSCOPY   ? CORONARY ANGIOPLASTY WITH STENT PLACEMENT Bilateral   patient denies ? laryngoscopy with biopsy  02/20/2019 ? ROTATOR CUFF REPAIR Left 1999 ? TONSILLECTOMY    age 87 ? TOTAL HIP ARTHROPLASTY Right 2002 FAMILY HISTORY:Family History Problem Relation Age of Onset ? Heart disease Mother  ? Bladder cancer Father 10 ? Kidney cancer Sister  ? Leukemia Brother 21 ? No Known Problems Daughter  ? No Known Problems Son  SOCIAL HISTORY:Social History Socioeconomic History ? Marital status: Divorced   Spouse  name: Not on file ? Number of children: Not on file ? Years of education: Not on file ? Highest education level: Not on file Occupational History ? Not on file Tobacco Use ? Smoking status: Never Smoker ? Smokeless tobacco: Never Used Vaping Use ? Vaping Use: Never used Substance and Sexual Activity ? Alcohol use: Not Currently ? Drug use: No ? Sexual activity: Not on file Other Topics Concern ? Not on file Social History Narrative  Divorced, works for [employer] in [place] will be retiring 03/17/19.  Has 1 son in [place] and 1 daughter in [place]. Llives in [place]  Social Determinants of Health Financial Resource Strain: Not on file Food Insecurity: Not on file Transportation Needs: Not on file Physical Activity: Not on file Stress: Not on file Social Connections: Not on file Intimate Partner Violence: Not on file Housing Stability: Not on file ALLERGIES:No Known AllergiesMEDICATIONS:Current Outpatient Medications Medication Sig ? acetaminophen (TYLENOL) 325 mg tablet Take 3 tablets (975 mg total) by mouth every 6 (six) hours as needed (mild to moderate pain). (Patient not taking: No sig reported) ? ALPRAZolam (XANAX) 0.5 mg tablet Take 0.5 mg by mouth as needed. ? aspirin 81 MG EC tablet Take 81 mg by mouth nightly.  ? diphenoxylate-atropine (LOMOTIL) 2.5-0.025 mg per tablet Take 1 tablet by mouth 4 (four) times daily as needed for diarrhea for up to 10 days. ? docusate sodium (COLACE) 250 mg capsule Take 1 capsule (250 mg total) by mouth 2 (two) times daily as needed for constipation. (Patient not taking: No sig reported) ? FREESTYLE LIBRE 14 DAY sensor kit 1 each by Other route every 14 (fourteen) days. ? glipiZIDE (GLUCOTROL XL) 10 MG 24 hr tablet Take 10 mg by mouth nightly.  ? ibuprofen (ADVIL,MOTRIN) 200 mg tablet Take 2 tablets (400 mg total) by mouth every 6 (six) hours as needed (alternate with tylenol for mild to moderate pain). ? indomethacin (INDOCIN) 25 mg capsule 3 (three) times daily. (Patient not taking: No sig reported) ? levothyroxine (SYNTHROID, LEVOTHROID) 50 MCG tablet Take 50 mcg by mouth daily. ? loperamide (IMODIUM A-D) 2 mg tablet Take 1 tablet (2 mg total) by mouth See Admin Instructions. 4mg  ( 2 tablets ) in the morning. And then with each bm take an additional one table . Max dose per day 16mg  ? olopatadine (PATANOL) 0.1 % ophthalmic solution as needed (takes for eye irritation as needed). (Patient not taking: Reported on 11/19/2020) ? simvastatin (ZOCOR) 20 MG tablet Take 20 mg by mouth nightly.  ? TOUJEO SOLOSTAR U-300 300 unit/mL (1.5 mL) pen Inject 70 Units under the skin nightly. ? triamcinolone (KENALOG) 0.1 % ointment Apply topically 2 (two) times daily as needed. To areas of rash. (Patient not taking: No sig reported) ? vit A/vit C/vit E/zinc/copper (PRESERVISION AREDS ORAL) Take by mouth 2 (two) times daily. ? zolpidem (AMBIEN) 10 mg tablet nightly.. No current facility-administered medications for this visit. VITALS: BP (!) 166/95  - Pulse 89  - Temp 97.8 ?F (36.6 ?C) (Temporal)  - Resp 18  - Wt 123.3 kg  - SpO2 98%  - BMI 38.45 kg/m?  PHYSICAL EXAM:Gen:  NAD, pleasant HEENT:  EOMI, PEARLLN:  No cervical, supraclavicular, axillary, or inguinal LAD. CV:  No murmurs, mild tachy, no m/r/g Pulm:  CTA b/lAbd:  Soft, no tenderness, no guarding or rebound, no organomegaly, no ascitesExt:  WWP, 1+ symmetric edema in the ankles.  Lymphedema present in the right arm, unchanged.Neuro:   No focal deficitsPsych:  Normal mood and affectSkin:  Scar on the right upper back well healed without evidence of recurrence. Small lateral skin puckering at the edges of the scar stable.  Flaking skin/dryness in the middle portion of the scar.  Right axillary scar well healed.  Scar from LN recurrence well healed. Eczema to  umbilical area. Right hand and leg - red non pruritic rash noted LABS:IMAGING/PATH:No results found. 07/22/20: SOFT TISSUE, RIGHT UPPER BACK, EXCISION: ? ? ? ?- METASTATIC MELANOMA INVOLVING A LYMPH NODE (1/1) ? ? ? ? ? - SIZE OF THE LARGEST TUMOR DEPOSIT: 11 MM ? ? ? ? ? - SIZE OF THE LYMPH NODE: 1.6 CM WLE and SLN 03/03/19:LYMPH NODES, RIGHT AXILLARY LEVELS 1, 2 AND 3, LYMPH NODE DISSECTION: ?  ? ? - ?THREE OF THIRTY-FIVE LYMPH NODES, POSITIVE FOR MELANOMA (3 OF 35) WITH LARGEST INVOLVED LYMPH NODE MEASURING 4.5 X 3 CM ? ? ?- LYMPHATIC INVASION IDENTIFIED ? ? ?- NO EXTRANODAL EXTENSION IDENTIFIED 1. ?SKIN, BACK, RIGHT UPPER, EXCISION: ? ? ? ?- MALIGNANT MELANOMA, SEE NOTE AND SYNOPTIC SUMMARY Note: Sections show a tumor composed of S100 positive epithelioid cells within the dermis and subcutaneous tissue. Cytokeratin AE1/AE3 and desmin are negative. A junctional component is not identified. The findings would be compatible with a primary dermal melanoma or a metastatic lesion. Clinical correlation is suggested. 2. ?SKIN, BACK, EXCISION: ? ? ? ?- BENIGN FIBROADIPOSE TISSUE AND SKELETAL MUSCLE SYNOPTIC SUMMARY MELANOMA OF THE SKIN Tumor Site: ? ? Back Laterality: ? ? Right Procedure: ? ? Primary excision Maximum Tumor Thickness (Depth): ? ? 30 mm Ulceration: ? ? Not identified Mitotic Rate (Mitoses/mm2): ? ? 2 Anatomic Level: ? ? V (Melanoma invades subcutis) Growth Phase: ? ? Vertical Histologic Type: ? ? Melanoma, NOS Tumor-Infiltrating Lymphocytes: ? ? Present, non-brisk Tumor Regression: ? ? Not identifed Lymphovascular Invasion: ? ? Present Microsatellitosis: ? ? Present Margins ? ? ?Peripheral Margins: ? ? Uninvolved Deep Margin: ? ? Uninvolved Stage (AJCC 8th Ed): ? ? pT4a Nx Base of the tongue and vallecula random biopsies 02/20/19: 1. ?TONGUE, LEFT, BASE, BIOPSY: ? ? ? ?- LYMPHOID TISSUE HYPERPLASIA ? ? ?- NEGATIVE FOR MALIGNANCY 2. ?THROAT, VALLECULA, BIOPSY: ? ?  ? - LYMPHOID TISSUE HYPERPLASIA AND FOCAL CHRONIC INFLAMMATION. SEE NOTE. ? ? ?- NEGATIVE FOR MALIGNANCY ? ? ? ? ? Note: Immunostain of Sox-10 to investigate focal pigment is negative, supporting above interpretation. 3. ?THROAT, LEFT VALLECULA, BIOPSY: ? ? ? ?- LYMPHOID TISSUE HYPERPLASIA ? ? ?- NEGATIVE FOR MALIGNANCY 4. ?THROAT, RIGHT VALLECULA, BIOPSY: ? ? ? ? ? ? - LYMPHOID TISSUE HYPERPLASIA AND FOCAL CHRONIC INFLAMMATION ? ? ?- NEGATIVE FOR MALIGNANCY 5. ?TONGUE, RIGHT BASE, BIOPSY: ? ? ? ? ? ? - LYMPHOID TISSUE HYPERPLASIA AND FOCAL CHRONIC INFLAMMATION ? ? ?- NEGATIVE FOR MALIGNANCY Pathology (reviewed at [facility]), collected 01/07/19:DIAGNOSIS: ? 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 Clinical pathological correlation is recommended. MICROSCOPIC DESCRIPTION: There are atypical cells in a nodule in the dermis. The cells are positive with SOX-10 and by report are negative with LCA, Kappa, Lambda, and a cytokeratin. The cells are also positive with S-100 by report.ASSESSMENT/PLAN:[name] is a 75 y.o. 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 Duanne Moron on 03/03/19 showing a stage IIIC melanoma that  was 30 mm thick, positive for LVI and microsatellites with 3/25 positive LNs.  Given the thickness of his primary melanoma and microsatellitosis, we felt that his melanoma-specific survival is closer to a stage IIID:  60% over 10 years with stage IIIC disease versus 24% with stage IIID.  He opted to proceed with the Moderna clinical trial (?A Phase 2 Randomized Study of Adjuvant Immunotherapy with the Personalized Cancer Vaccine [phone] and Pembrolizumab Versus Pembrolizumab Alone After Complete Resection of High-Risk Melanoma?) and completed 1 year of adjuvant therapy on trial without issues.  He developed an enlarged 1 cm SQ nodule overlying the right scapula noted on scans 07/05/20 s/p excision showing a 1.6 cm LN containing a 11 mm deposit of melanoma.MRI brain 09/18/20 showed a 8 mm left hippocampus lesion and a 6 mm lesion in the left mesial temporal lobe concerning for brain mets.  Reviewed the case with Dr. Winfred Leeds; lesions are too small and ill-defined to biopsy and would hold off on GKRS for now.  Given high suspicion for metastatic disease, recurrence of his disease recently, and fairly good performance status, would opt to be aggressive.  I reviewed with Jason Rowland that ideally, we would want to get a biopsy to establish stage IV disease, but given that we know his disease is aggressive and presented again shortly after discontinuation of adjuvant anti-PD-1, we can presume that this is metastatic disease and start a new line of systemic therapy.  Discussed the available brain met clinical trials, but he wants to move to [facility] and would like to pursue standard of care. Discussed data from [employer] utilizing ipi 3 + nivo 1 in brain metastasis which demonstrated similar intracranial and extracranial efficacy.  Jason Rowland agreed to proceed.   C4 ipi/nivo given today with strict instructions to call the clinic and how to take imodium and lomotil .- stool samples from last week cdiff and stool for wbc negative. procal - pending - wrote down for Jason Rowland  imodium 2 tablets in the am and with each BM 1 tablet - max of 16mg . Lomotil if he has more than 3 episodes of diarrhea even after taking he imodium- rx for prednisone 60mg  taper was sent to the pharmacy. He should not be taking this without our teams instruction. Reviewed BRAT diet as well- asked that he keep a diary of the foods he eats, his BM and the medications he takes- uveitis ruled out. Eyes sxs stable- labs reviewed and unremarkable. TSH 4.770 - hoping to spend 2 weeks in [place] with family in between his treatment cycles - Due for scans after the 4 cycles of ipi/nivo. - routine f/u with dermatologist  Beg of sept - will address his excema and the new area om his hand and legs- RTC in  4week for nivo monotherapy.- encouraged to call in the interim with any concerns gave him the clinic number [phone] on the piece of paper he knows there are on call providers and to call anytime especially if he has concern of worsening diarrhea - will speak to him on Tuesday and then proceed with scheduling scans.  40  min were spent in chart review and direct patient contact on the day of the encounter.  Discussed the case with Dr. Laveda Norman and Dr. Laveda Norman will not bill for services related to this encounte

## 2020-12-21 ENCOUNTER — Ambulatory Visit: Admit: 2020-12-21 | Payer: PRIVATE HEALTH INSURANCE

## 2020-12-21 ENCOUNTER — Telehealth: Admit: 2020-12-21 | Payer: PRIVATE HEALTH INSURANCE

## 2020-12-21 NOTE — Telephone Encounter
Called patient Jason Rowland we need to schedule [place] scans and MRI

## 2020-12-21 NOTE — Telephone Encounter
Called Jason Rowland to see how he was feeling. He reports neck pain with movt. Relieved with motrin, pian intermittent - will monitor at this timeHis diarrhea is stable. He has been keeping a diary.eating meal a day and then snacks thruout the day.Reports that he takes 1 modium in the am and then 1 tablet subsequent with each bm. Sunday - no pmHe felt slightly constipated this dayMonday He has 3 episodes of diarrhea Then 1 episodes of semi formed stool Then one very large formed BM.. he reports he had abdominal cramping and feels as thought taking 2 imodium in the am causes constipation and pain,Tuesday - one episode of semi formed stoolHe denies n/v.He is traveling to [facility] 9/15- 9/25 will try to get his scans booked for when he returnsHe has tx appt 9/30 in [place] Electronically Signed by Bo Mcclintock, APRN, December 21, 2020

## 2020-12-23 ENCOUNTER — Encounter: Admit: 2020-12-23 | Payer: PRIVATE HEALTH INSURANCE

## 2020-12-23 DIAGNOSIS — C7931 Secondary malignant neoplasm of brain: Secondary | ICD-10-CM

## 2020-12-28 ENCOUNTER — Inpatient Hospital Stay: Admit: 2020-12-28 | Discharge: 2020-12-28 | Payer: BLUE CROSS/BLUE SHIELD

## 2020-12-29 ENCOUNTER — Telehealth: Admit: 2020-12-29 | Payer: PRIVATE HEALTH INSURANCE

## 2020-12-29 ENCOUNTER — Encounter: Admit: 2020-12-29 | Payer: PRIVATE HEALTH INSURANCE

## 2020-12-29 MED ORDER — ONDANSETRON 8 MG DISINTEGRATING TABLET
8 mg | ORAL_TABLET | Freq: Three times a day (TID) | 1 refills | Status: AC | PRN
Start: 2020-12-29 — End: 2022-03-17

## 2020-12-29 NOTE — Telephone Encounter
Phone call to Jason Rowland to let him know that Dr Laveda Norman said it was ok to have his CTCAP and MRI after his 9/30 appointment. CTCAP and MRI booked @ [facility] in [place] for 10/2 . Patient is aware and agrees with appointment date

## 2021-01-07 ENCOUNTER — Ambulatory Visit: Admit: 2021-01-07 | Payer: BLUE CROSS/BLUE SHIELD

## 2021-01-10 ENCOUNTER — Ambulatory Visit: Admit: 2021-01-10 | Payer: BLUE CROSS/BLUE SHIELD

## 2021-01-14 ENCOUNTER — Encounter: Admit: 2021-01-14 | Payer: PRIVATE HEALTH INSURANCE

## 2021-01-14 ENCOUNTER — Ambulatory Visit: Admit: 2021-01-14 | Payer: BLUE CROSS/BLUE SHIELD

## 2021-01-14 ENCOUNTER — Inpatient Hospital Stay: Admit: 2021-01-14 | Discharge: 2021-01-14 | Payer: BLUE CROSS/BLUE SHIELD

## 2021-01-14 DIAGNOSIS — Z5112 Encounter for antineoplastic immunotherapy: Secondary | ICD-10-CM

## 2021-01-14 DIAGNOSIS — Z79899 Other long term (current) drug therapy: Secondary | ICD-10-CM

## 2021-01-14 DIAGNOSIS — Z7952 Long term (current) use of systemic steroids: Secondary | ICD-10-CM

## 2021-01-14 DIAGNOSIS — Z955 Presence of coronary angioplasty implant and graft: Secondary | ICD-10-CM

## 2021-01-14 DIAGNOSIS — Z8249 Family history of ischemic heart disease and other diseases of the circulatory system: Secondary | ICD-10-CM

## 2021-01-14 DIAGNOSIS — E7800 Pure hypercholesterolemia, unspecified: Secondary | ICD-10-CM

## 2021-01-14 DIAGNOSIS — C7931 Secondary malignant neoplasm of brain: Secondary | ICD-10-CM

## 2021-01-14 DIAGNOSIS — Z791 Long term (current) use of non-steroidal anti-inflammatories (NSAID): Secondary | ICD-10-CM

## 2021-01-14 DIAGNOSIS — C4359 Malignant melanoma of other part of trunk: Secondary | ICD-10-CM

## 2021-01-14 DIAGNOSIS — C773 Secondary and unspecified malignant neoplasm of axilla and upper limb lymph nodes: Secondary | ICD-10-CM

## 2021-01-14 DIAGNOSIS — Z7982 Long term (current) use of aspirin: Secondary | ICD-10-CM

## 2021-01-14 DIAGNOSIS — Z79890 Hormone replacement therapy (postmenopausal): Secondary | ICD-10-CM

## 2021-01-14 DIAGNOSIS — E1142 Type 2 diabetes mellitus with diabetic polyneuropathy: Secondary | ICD-10-CM

## 2021-01-14 DIAGNOSIS — Z8052 Family history of malignant neoplasm of bladder: Secondary | ICD-10-CM

## 2021-01-14 DIAGNOSIS — Z8051 Family history of malignant neoplasm of kidney: Secondary | ICD-10-CM

## 2021-01-14 DIAGNOSIS — Z806 Family history of leukemia: Secondary | ICD-10-CM

## 2021-01-14 DIAGNOSIS — N200 Calculus of kidney: Secondary | ICD-10-CM

## 2021-01-14 DIAGNOSIS — Z794 Long term (current) use of insulin: Secondary | ICD-10-CM

## 2021-01-14 DIAGNOSIS — E119 Type 2 diabetes mellitus without complications: Secondary | ICD-10-CM

## 2021-01-14 DIAGNOSIS — Z87442 Personal history of urinary calculi: Secondary | ICD-10-CM

## 2021-01-14 LAB — COMPREHENSIVE METABOLIC PANEL
BKR A/G RATIO: 1 (ref 1.0–2.2)
BKR ALANINE AMINOTRANSFERASE (ALT): 17 U/L (ref 9–59)
BKR ALBUMIN: 3.9 g/dL (ref 3.6–4.9)
BKR ALKALINE PHOSPHATASE: 76 U/L (ref 9–122)
BKR ANION GAP: 8 (ref 7–17)
BKR ASPARTATE AMINOTRANSFERASE (AST): 20 U/L (ref 10–35)
BKR AST/ALT RATIO: 1.2
BKR BILIRUBIN TOTAL: 0.3 mg/dL (ref <=1.2)
BKR BUN / CREAT RATIO: 17 (ref 8.0–23.0)
BKR CALCIUM: 9.3 mg/dL (ref 8.8–10.2)
BKR CHLORIDE: 100 mmol/L (ref 98–107)
BKR CO2: 30 mmol/L (ref 20–30)
BKR CREATININE: 0.88 mg/dL (ref 0.40–1.30)
BKR EGFR, CREATININE (CKD-EPI 2021): >60 mL/min/{1.73_m2} (ref >=60)
BKR GLOBULIN: 3.9 g/dL — ABNORMAL HIGH (ref 2.3–3.5)
BKR GLUCOSE: 211 mg/dL — ABNORMAL HIGH (ref 70–100)
BKR POTASSIUM: 4.3 mmol/L (ref 3.3–5.3)
BKR PROTEIN TOTAL: 7.8 g/dL (ref 6.6–8.7)
BKR SODIUM: 138 mmol/L (ref 136–144)

## 2021-01-14 LAB — CBC WITH AUTO DIFFERENTIAL
BKR WAM ABSOLUTE IMMATURE GRANULOCYTES.: 0.03 x 1000/??L (ref 0.00–0.30)
BKR WAM ABSOLUTE LYMPHOCYTE COUNT.: 2.34 x 1000/??L (ref 0.60–3.70)
BKR WAM ABSOLUTE NRBC (2 DEC): 0 x 1000/??L (ref 0.00–1.00)
BKR WAM ANALYZER ANC: 5.68 x 1000/??L (ref 2.00–7.60)
BKR WAM BASOPHIL ABSOLUTE COUNT.: 0.06 x 1000/??L (ref 0.00–1.00)
BKR WAM BASOPHILS: 0.6 % (ref 0.0–1.4)
BKR WAM EOSINOPHIL ABSOLUTE COUNT.: 0.56 x 1000/??L (ref 0.00–1.00)
BKR WAM EOSINOPHILS: 6 % — ABNORMAL HIGH (ref 0.0–5.0)
BKR WAM HEMATOCRIT (2 DEC): 42.9 % (ref 38.50–50.00)
BKR WAM HEMOGLOBIN: 14 g/dL (ref 13.2–17.1)
BKR WAM IMMATURE GRANULOCYTES: 0.3 % (ref 0.0–1.0)
BKR WAM LYMPHOCYTES: 25.1 % (ref 17.0–50.0)
BKR WAM MCH (PG): 28.9 pg (ref 27.0–33.0)
BKR WAM MCHC: 32.6 g/dL (ref 31.0–36.0)
BKR WAM MCV: 88.6 fL (ref 80.0–100.0)
BKR WAM MONOCYTE ABSOLUTE COUNT.: 0.64 x 1000/??L (ref 0.00–1.00)
BKR WAM MONOCYTES: 6.9 % (ref 4.0–12.0)
BKR WAM MPV: 10.2 fL (ref 8.0–12.0)
BKR WAM NEUTROPHILS: 61.1 % (ref 39.0–72.0)
BKR WAM NUCLEATED RED BLOOD CELLS: 0 % (ref 0.0–1.0)
BKR WAM PLATELETS: 275 x1000/??L (ref 150–420)
BKR WAM RDW-CV: 13.6 % (ref 11.0–15.0)
BKR WAM RED BLOOD CELL COUNT.: 4.84 M/??L (ref 4.00–6.00)
BKR WAM WHITE BLOOD CELL COUNT: 9.3 x1000/??L (ref 4.0–11.0)

## 2021-01-14 MED ORDER — NIVOLUMAB INFUSION
Freq: Once | INTRAVENOUS | Status: CP
Start: 2021-01-14 — End: ?
  Administered 2021-01-14: 16:00:00 100.000 mL/h via INTRAVENOUS

## 2021-01-14 MED ORDER — LOPERAMIDE 2 MG TABLET
2 mg | ORAL_TABLET | ORAL | 4 refills | Status: AC
Start: 2021-01-14 — End: 2021-06-10

## 2021-01-14 MED ORDER — DIPHENOXYLATE-ATROPINE 2.5 MG-0.025 MG TABLET
ORAL_TABLET | Freq: Four times a day (QID) | ORAL | 1 refills | Status: AC | PRN
Start: 2021-01-14 — End: 2021-06-10

## 2021-01-14 NOTE — Progress Notes
Patient admitted today for Opdivo infusion.CBC CMP reviewed, ok to treat.States vision is foggy on and off, diarrhea up to 7 times a day but lately has some formed stools. Had one time diarrhea yesterday. Patient loves to eat fried food. Patient will take Imodium and Lomotil as prescribed by his MD. Patient verbalizes understanding when and how to take it.Handout about BRAT diet given.RAF 22g IV with + bld return, Opdivo infused over 30 min, tolerated well.Rash around umbilicus. Patient states has cream but is not consistently putting it on. Encouraged to do so. Also seeing Dermatologist.Patient instructed to call MD for worsening diarrhea not relieved by his medication or worsening of the rash, or other side effects he might experiences.Patient verbalizes understanding.RTC on 02/11/2021 for ov, labs and infusion. Message sent to PFAS.
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
## 2021-01-16 ENCOUNTER — Inpatient Hospital Stay: Admit: 2021-01-16 | Discharge: 2021-01-16 | Payer: BLUE CROSS/BLUE SHIELD

## 2021-01-16 DIAGNOSIS — C7931 Secondary malignant neoplasm of brain: Secondary | ICD-10-CM

## 2021-01-16 MED ORDER — SODIUM CHLORIDE 0.9 % (FLUSH) INJECTION SYRINGE
0.9 % | INTRAVENOUS | Status: DC | PRN
Start: 2021-01-16 — End: 2021-01-21

## 2021-01-16 MED ORDER — GADOTERATE MEGLUMINE 0.5 MMOL/ML (376.9 MG/ML) INTRAVENOUS SOLUTION
0.5 mmol/mL (376.9 mg/mL) | Freq: Once | INTRAVENOUS | Status: CP | PRN
Start: 2021-01-16 — End: ?
  Administered 2021-01-16: 17:00:00 0.5 mL via INTRAVENOUS

## 2021-01-16 MED ORDER — IOHEXOL 350 MG IODINE/ML INTRAVENOUS SOLUTION
350 mg iodine/mL | Freq: Once | INTRAVENOUS | Status: CP | PRN
Start: 2021-01-16 — End: ?
  Administered 2021-01-16: 15:00:00 350 mL via INTRAVENOUS

## 2021-01-16 MED ORDER — SODIUM CHLORIDE 0.9 % BOLUS (NEW BAG)
0.9 % | Freq: Once | INTRAVENOUS | Status: CP
Start: 2021-01-16 — End: ?
  Administered 2021-01-16: 15:00:00 0.9 mL/h via INTRAVENOUS

## 2021-01-20 ENCOUNTER — Telehealth: Admit: 2021-01-20 | Payer: PRIVATE HEALTH INSURANCE

## 2021-01-20 NOTE — Telephone Encounter
Reviewed most recent scans with andy over the phone. Verbalized understanding. Next cyle of nivo 10/28 Electronically Signed by Bo Mcclintock, APRN, January 20, 2021

## 2021-02-11 ENCOUNTER — Ambulatory Visit: Admit: 2021-02-11 | Payer: BLUE CROSS/BLUE SHIELD | Attending: Clinical

## 2021-02-11 ENCOUNTER — Encounter: Admit: 2021-02-11 | Payer: PRIVATE HEALTH INSURANCE

## 2021-02-11 ENCOUNTER — Ambulatory Visit: Admit: 2021-02-11 | Payer: BLUE CROSS/BLUE SHIELD

## 2021-02-11 ENCOUNTER — Inpatient Hospital Stay: Admit: 2021-02-11 | Discharge: 2021-02-11 | Payer: BLUE CROSS/BLUE SHIELD

## 2021-02-11 DIAGNOSIS — C7931 Secondary malignant neoplasm of brain: Secondary | ICD-10-CM

## 2021-02-11 DIAGNOSIS — Z7952 Long term (current) use of systemic steroids: Secondary | ICD-10-CM

## 2021-02-11 DIAGNOSIS — Z794 Long term (current) use of insulin: Secondary | ICD-10-CM

## 2021-02-11 DIAGNOSIS — E7800 Pure hypercholesterolemia, unspecified: Secondary | ICD-10-CM

## 2021-02-11 DIAGNOSIS — Z8249 Family history of ischemic heart disease and other diseases of the circulatory system: Secondary | ICD-10-CM

## 2021-02-11 DIAGNOSIS — Z955 Presence of coronary angioplasty implant and graft: Secondary | ICD-10-CM

## 2021-02-11 DIAGNOSIS — C4359 Malignant melanoma of other part of trunk: Secondary | ICD-10-CM

## 2021-02-11 DIAGNOSIS — Z79890 Hormone replacement therapy (postmenopausal): Secondary | ICD-10-CM

## 2021-02-11 DIAGNOSIS — E1142 Type 2 diabetes mellitus with diabetic polyneuropathy: Secondary | ICD-10-CM

## 2021-02-11 DIAGNOSIS — Z5112 Encounter for antineoplastic immunotherapy: Secondary | ICD-10-CM

## 2021-02-11 DIAGNOSIS — N200 Calculus of kidney: Secondary | ICD-10-CM

## 2021-02-11 DIAGNOSIS — Z7982 Long term (current) use of aspirin: Secondary | ICD-10-CM

## 2021-02-11 DIAGNOSIS — E119 Type 2 diabetes mellitus without complications: Secondary | ICD-10-CM

## 2021-02-11 LAB — CBC WITH AUTO DIFFERENTIAL
BKR CALCIUM: 64.5 % (ref 39.0–72.0)
BKR WAM ABSOLUTE IMMATURE GRANULOCYTES.: 0.02 x 1000/??L (ref 0.00–0.30)
BKR WAM ABSOLUTE LYMPHOCYTE COUNT.: 2.21 x 1000/??L (ref 0.60–3.70)
BKR WAM ABSOLUTE NRBC (2 DEC): 0 x 1000/??L (ref 0.00–1.00)
BKR WAM ANALYZER ANC: 5.9 x 1000/??L (ref 2.00–7.60)
BKR WAM BASOPHILS: 0.8 % (ref 0.0–1.4)
BKR WAM EOSINOPHIL ABSOLUTE COUNT.: 0.32 x 1000/??L (ref 0.00–1.00)
BKR WAM EOSINOPHILS: 3.5 % (ref 0.0–5.0)
BKR WAM HEMATOCRIT (2 DEC): 42.6 % (ref 38.50–50.00)
BKR WAM HEMOGLOBIN: 14 g/dL (ref 13.2–17.1)
BKR WAM IMMATURE GRANULOCYTES: 0.2 % (ref 0.0–1.0)
BKR WAM LYMPHOCYTES: 24.2 % — ABNORMAL HIGH (ref 17.0–50.0)
BKR WAM MCH (PG): 29.1 pg (ref 27.0–33.0)
BKR WAM MCHC: 32.9 g/dL (ref 31.0–36.0)
BKR WAM MCV: 88.6 fL (ref 80.0–100.0)
BKR WAM MONOCYTE ABSOLUTE COUNT.: 0.62 x 1000/??L (ref 0.00–1.00)
BKR WAM MONOCYTES: 6.8 % (ref 4.0–12.0)
BKR WAM MPV: 10.4 fL (ref 8.0–12.0)
BKR WAM NEUTROPHILS: 64.5 % (ref 39.0–72.0)
BKR WAM NUCLEATED RED BLOOD CELLS: 0 % (ref 0.0–1.0)
BKR WAM PLATELETS: 290 x1000/??L — ABNORMAL HIGH (ref 150–420)
BKR WAM RDW-CV: 14.1 % — ABNORMAL HIGH (ref 11.0–15.0)
BKR WAM RED BLOOD CELL COUNT.: 4.81 M/??L (ref 4.00–6.00)
BKR WAM WHITE BLOOD CELL COUNT: 9.1 x1000/??L (ref 4.0–11.0)

## 2021-02-11 LAB — COMPREHENSIVE METABOLIC PANEL
BKR A/G RATIO: 1.1 (ref 1.0–2.2)
BKR ALANINE AMINOTRANSFERASE (ALT): 19 U/L (ref 9–59)
BKR ALBUMIN: 4 g/dL (ref 3.6–4.9)
BKR ALKALINE PHOSPHATASE: 72 U/L (ref 9–122)
BKR ANION GAP: 11 g/dL (ref 7–17)
BKR ASPARTATE AMINOTRANSFERASE (AST): 20 U/L (ref 10–35)
BKR AST/ALT RATIO: 1.1 x 1000/??L (ref 0.00–1.00)
BKR BILIRUBIN TOTAL: 0.3 mg/dL (ref <=1.2)
BKR BLOOD UREA NITROGEN: 29 mg/dL — ABNORMAL HIGH (ref 8–23)
BKR BUN / CREAT RATIO: 29.9 — ABNORMAL HIGH (ref 8.0–23.0)
BKR CHLORIDE: 99 mmol/L (ref 98–107)
BKR CO2: 28 mmol/L (ref 20–30)
BKR CREATININE: 0.97 mg/dL (ref 0.40–1.30)
BKR EGFR, CREATININE (CKD-EPI 2021): >60 mL/min/{1.73_m2} (ref >=60)
BKR GLOBULIN: 3.8 g/dL — ABNORMAL HIGH (ref 2.3–3.5)
BKR GLUCOSE: 143 mg/dL — ABNORMAL HIGH (ref 70–100)
BKR POTASSIUM: 4.3 mmol/L (ref 3.3–5.3)
BKR PROTEIN TOTAL: 7.8 g/dL (ref 6.6–8.7)
BKR SODIUM: 138 mmol/L (ref 136–144)
BKR WAM BASOPHIL ABSOLUTE COUNT.: >60 mL/min/{1.73_m2} (ref >=60–1.00)

## 2021-02-11 LAB — LACTATE DEHYDROGENASE: BKR LACTATE DEHYDROGENASE: 152 U/L (ref 122–241)

## 2021-02-11 LAB — TSH W/REFLEX TO FT4     (BH GH LMW Q YH): BKR THYROID STIMULATING HORMONE: 2.38 ??IU/mL

## 2021-02-11 MED ORDER — ALBUTEROL SULFATE 2.5 MG/3 ML (0.083 %) SOLUTION FOR NEBULIZATION
2.530.083 mg /3 mL (0.083 %) | RESPIRATORY_TRACT | Status: DC | PRN
Start: 2021-02-11 — End: 2021-02-11

## 2021-02-11 MED ORDER — DIPHENHYDRAMINE 50 MG/ML INJECTION (WRAPPED E-RX)
50 mg/mL | Freq: Once | INTRAVENOUS | Status: DC | PRN
Start: 2021-02-11 — End: 2021-02-11

## 2021-02-11 MED ORDER — NIVOLUMAB INFUSION
Freq: Once | INTRAVENOUS | Status: CP
Start: 2021-02-11 — End: ?
  Administered 2021-02-11: 17:00:00 100.000 mL/h via INTRAVENOUS

## 2021-02-11 MED ORDER — EPINEPHRINE 0.3 MG/0.3 ML INJECTION, AUTO-INJECTOR
0.30.3 mg/ mL | INTRAMUSCULAR | Status: DC | PRN
Start: 2021-02-11 — End: 2021-02-11

## 2021-02-11 MED ORDER — SODIUM CHLORIDE 0.9 % BOLUS (NEW BAG)
0.9 % | Freq: Once | INTRAVENOUS | Status: DC | PRN
Start: 2021-02-11 — End: 2021-02-11

## 2021-02-11 MED ORDER — HYDROCORTISONE SODIUM SUCCINATE 100 MG SOLUTION FOR INJECTION
100 mg | Freq: Once | INTRAVENOUS | Status: DC | PRN
Start: 2021-02-11 — End: 2021-02-11

## 2021-02-11 MED ORDER — MEPERIDINE 25 MG/2.5 ML IN 0.9% SODIUM CHLORIDE
Freq: Once | INTRAVENOUS | Status: DC | PRN
Start: 2021-02-11 — End: 2021-02-11

## 2021-02-11 MED ORDER — FAMOTIDINE 4 MG/ML IN 0.9% SODIUM CHLORIDE (ADULT)
Freq: Once | INTRAVENOUS | Status: DC | PRN
Start: 2021-02-11 — End: 2021-02-11

## 2021-02-11 NOTE — Patient Instructions
Please call office if QMV:HQIONGE a temperature of 100.4 or higher with or without shaking chillsdevelop a cough with yellow or green sputumhave urinary frequency, urgency or other infectious processeshave nausea or vomiting that is not well controlled with home medicationshave constipation or diarrhea that is not well controlled with home medicationsnotice unusual bleeding or bruises, blood in sputum, urine or stool have any falls where you hit your headExperience poor oral intake or signs or symptoms os dehydration (dizziness upon standing, very dry mucous membranes or dark concentrated urine)Remember to:Wash hands and utilize good hand hygiene before and after using the bathroom and before eatingAvoid sick family members and friendsConsume small frequent meals high in protein and caloriesDrink 6-8 glasses of non-caffeine, non-alcoholic beverages daily for adequate hydration

## 2021-02-11 NOTE — Progress Notes
Patient arrived today for Opdivo infusion.CBC CMP reviewed, ok to treat.States vision is foggy on and off, not worsening, and brightness is bothering him.Had 2-3 loose bowel movements after last treatment every 3-4 days. Taking imodium and lomotil as instructed. Patient verbalizes understanding when and how to take it.LAF 22g IV with + bld return, Opdivo infused over 30 min, tolerated well.Rash around umbilicus. Patient states has cream but is not consistently putting it on. Encouraged to do so. Also seeing Dermatologist.Patient instructed to call MD for worsening diarrhea not relieved by his medication or worsening of the rash, or other side effects he might experiences.Patient verbalizes understanding.RTC on 03/11/2021 for ov, labs and infusion. ?

## 2021-02-11 NOTE — Progress Notes
NAME:  Jason Rowland (February 21, 1946)AGE: 75 y.o. MRN:  [id number] PROVIDER: Era Bumpers APRNDATE OF SERVICE: 02/11/2021 [facility] [place]  REASON FOR VISIT:  Ongoing treatment ONC DX:  Invasive melanoma right mid backSTAGE:  Likely IV (pT4a N3 M1d(0) - originally stage IIIC with 30 mm with microsatellitosis, mitotic rate 2 per mm2).  Noted 09/18/20 to have new brain lesions (too small for biopsy or GKRS).MOLECULAR PROFILING:0% tumor and immune cell PD-L1 staining50-gene panel:  DNA VARIANT DETECTED ? ? 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 ? 74% PRIOR TX:  - WLE and SLNB 03/03/19- Adjuvant Moderna clinical trial (?A Phase 2 Randomized Study of Adjuvant Immunotherapy with the Personalized Cancer Vaccine [id number] and Pembrolizumab Versus Pembrolizumab Alone After Complete Resection of High-Risk Melanoma? Protocol [id number], HIC 47829) C1D1 05/08/19. Last dose 04/27/20. Randomized to the pembrolizumab and vaccine arm.- 07/22/20 resection of recurrent LN in the right upper back (1.6 cm LN with 1.1 cm tumor deposit)ACTIVE TX:   ipilimumab (3 mg/kg) + nivolumab (1 mg/kg) - (10/08/20 - )Onc Hx: Jason Rowland (prefers to be called Jason Rowland) is a 76 y.o. male who worked for [employer] (retired at the end of 02/2019 after working there for 50 years) with a PMH of DM2 complicated by peripheral neuropathy, HLD, and AKs who is referred by Joanie Coddington, his dermatologist for a right back metastatic melanoma.  He reports it appeared approx 10 months prior; his PCP originally thought it was a pimple.  Dr. Lorn Junes at [facility] later evaluated it and also thought it was a pimple and injected it with intralesional kenalog x 2 and oral cephalexin without improvement.  The lesion increased in size and burned whenever he leaned against anything.  Denies night sweats and weight loss.  Lucie Leather did a I&D on 01/07/19  with only slight bloody discharge, so a punch biopsy was obtain with path showing metastatic melanoma.  On 01/20/19, additional biopsies were done to try to evaluate a primary site, but we do not have this tissue.  Per patient report, these biopsies were negative. MRI brain 01/30/19 without intracranial disease.  PET done 02/07/19 showing preepiglottic FDG activity s/p ENT evaluation which was negative for any visible malignancy.  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 LDH elevated to 251 at diagnosis. His case was presented at the [facility]oma tumor board on 02/13/19 and consensus was to ask ENT for random biopsies of the BOT and pre-epiglottic PET-avid areas, as we do not want to miss a head and neck cancer but otherwise not felt to be related to his melanoma.  The cervical LN were not thought to be related.  He should also have gastroenterology evaluation for a PET-avid stranding lesion in the transverse colon.  Dr. Landis Gandy (ENT) performed BOT and vallecula random biopsies 02/20/19 which only showed chronic inflammation and no malignancy.  He had a colonoscopy by Dr. Linton Flemings on 02/27/19 showing only a tubular adenoma in the sigmoid colon.  Overall, it was felt that his right mid back lesion is the site of his primary disease, and the metastatic melanoma pathology interpretation may have been complicated due to repeat I&Ds and steroid injections that may have altered the architecture of the primary lesion.  In the absence of any confirmed metastatic disease, he underwent WLE and LND with Dr. Duanne Moron on 03/03/19.  Final path showed a 30 mm thick melanoma, 2 mitoses/mm2, non-brisk TILs, no tumor regression, LVI+, microsatellites+, negative margins, and 3/35 lymph nodes (largest LN  4.5 x 3 cm) were positive for melanoma with presence of lymphatic invasion without extranodal extension.He uses a cane sometimes as he has some balance issues due to neuropathy in his feet.  Otherwise, he is fully functional, active, and independent in ADLs/iADLs.  No hx of autoimmune disease.03/27/19 signed consent for the Moderna trial; randomized to pembro and vaccine arm.  Started treatment C1D1 05/08/19.04/24/19 noted to have wound dehiscence, non-infected, healing by secondary intent.  Re-instated VNA services to assist with dressing changes and packing.  Wound completely healed by 06/20/19.10/27/19, 01/12/20, and 04/24/20 surveillance scans NED.07/05/20 - [place] chest showing an increased 1.0 cm subcutaneous nodule in the right upper back.  Bx 07/06/20 not diagnostic.  Excision by Dr. Duanne Moron 07/22/20 showed metastatic melanoma involving 1 LN with 11 mm tumor deposit in a 1.6 cm LN.   09/18/20 - MRI brain showing a 8 mm left hippocampus lesion and a 6 mm lesion in the left mesial temporal lobe.  Reports new subtle memory issues.Interval ZO:XWRU returns for follow-up, scan review, and treatment today. I called and gave him scan reviewsHe is doing well. Bowel movements have been stable and formed, 1-2 episodes per week of  occasional diarrhea. Takes imodium or lomotil prn and diarrhea subsided. He did have diarrhea  last night after eating a whole rotisserie chicken. No issues this am Had an optho appt on 10/12 seen by another provider and was told he had  - macular degeneration. No other  concerns. No intervention or prescriptions ordered. His Next appt is in 1/2023Chronic eczema rash around umbilical area not causing pain, not itchy. Takes naps but thinks his energy is fairly good. Has believes he has an impacted tooth to the bottom left side that has intermittently bothered him; no obvious concerns on exam. He will call the dentist if he has worsening symptoms ECOG:  1Pain: 0/10Review of Systems:No fevers, chills, night sweats, lightheadedness, HA, CP, palpitations, wheezing, abd pain, constipation, blood in the urine or stools, dysuria.  Diabetic neuropathy in the hands and feet stable.   Positive responses on ROS as noted in interval hx. PAST MEDICAL HISTORY:Past Medical History: Diagnosis Date ? Diabetes mellitus (HC Code) (HC CODE) (HC Code)  ? High cholesterol  ? Kidney stones   40 years ago ? 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 Malignant melanoma of torso excluding breast (HC Code)  SURGICAL HISTORY:Past Surgical History: Procedure Laterality Date ? COLONOSCOPY   ? CORONARY ANGIOPLASTY WITH STENT PLACEMENT Bilateral   patient denies ? laryngoscopy with biopsy  02/20/2019 ? ROTATOR CUFF REPAIR Left 1999 ? TONSILLECTOMY    age 7 ? TOTAL HIP ARTHROPLASTY Right 2002 FAMILY HISTORY:Family History Problem Relation Age of Onset ? Heart disease Mother  ? Bladder cancer Father 11 ? Kidney cancer Sister  ? Leukemia Brother 39 ? No Known Problems Daughter  ? No Known Problems Son  SOCIAL HISTORY:Social History Socioeconomic History ? Marital status: Divorced   Spouse name: Not on file ? Number of children: Not on file ? Years of education: Not on file ? Highest education level: Not on file Occupational History ? Not on file Tobacco Use ? Smoking status: Never Smoker ? Smokeless tobacco: Never Used Vaping Use ? Vaping Use: Never used Substance and Sexual Activity ? Alcohol use: Not Currently ? Drug use: No ? Sexual activity: Not on file Other Topics Concern ? Not on file Social History Narrative  Divorced,  works for [employer] in [place] will be retiring 03/17/19.  Has 1 son in [place] and 1 daughter in [place]. Llives in [place]  Social Determinants of Health Financial Resource Strain: Not on file Food Insecurity: Not on file Transportation Needs: Not on file Physical Activity: Not on file Stress: Not on file Social Connections: Not on file Intimate Partner Violence: Not on file Housing Stability: Not on file ALLERGIES:No Known AllergiesMEDICATIONS:Current Outpatient Medications Medication Sig ? acetaminophen (TYLENOL) 325 mg tablet Take 3 tablets (975 mg total) by mouth every 6 (six) hours as needed (mild to moderate pain). (Patient not taking: No sig reported) ? ALPRAZolam (XANAX) 0.5 mg tablet Take 0.5 mg by mouth as needed. ? aspirin 81 MG EC tablet Take 81 mg by mouth nightly.  ? diphenoxylate-atropine (LOMOTIL) 2.5-0.025 mg per tablet Take 1 tablet by mouth 4 (four) times daily as needed for diarrhea. ? FREESTYLE LIBRE 14 DAY sensor kit 1 each by Other route every 14 (fourteen) days. ? glipiZIDE (GLUCOTROL XL) 10 MG 24 hr tablet Take 10 mg by mouth nightly.  ? ibuprofen (ADVIL,MOTRIN) 200 mg tablet Take 2 tablets (400 mg total) by mouth every 6 (six) hours as needed (alternate with tylenol for mild to moderate pain). ? indomethacin (INDOCIN) 25 mg capsule 3 (three) times daily. (Patient not taking: No sig reported) ? levothyroxine (SYNTHROID, LEVOTHROID) 50 MCG tablet Take 50 mcg by mouth daily. ? loperamide (IMODIUM A-D) 2 mg tablet Take 1 tablet (2 mg total) by mouth See Admin Instructions. 4mg  ( 2 tablets ) in the morning. And then with each bm take an additional one table . Max dose per day 16mg  ? olopatadine (PATANOL) 0.1 % ophthalmic solution as needed (takes for eye irritation as needed). (Patient not taking: Reported on 01/14/2021) ? ondansetron (ZOFRAN-ODT) 8 mg disintegrating tablet Place 1 tablet (8 mg total) onto the tongue every 8 (eight) hours as needed for nausea. (Patient not taking: Reported on 01/14/2021) ? predniSONE (DELTASONE) 20 mg tablet Take 3 tablets (60 mg total) by mouth daily. Take with food. Please speak to office before starting this medication (Patient not taking: Reported on 01/14/2021) ? simvastatin (ZOCOR) 20 MG tablet Take 20 mg by mouth nightly.  ? TOUJEO SOLOSTAR U-300 300 unit/mL (1.5 mL) pen Inject 70 Units under the skin nightly. ? triamcinolone (KENALOG) 0.1 % ointment Apply topically 2 (two) times daily as needed. To areas of rash. (Patient not taking: No sig reported) ? vit A/vit C/vit E/zinc/copper (PRESERVISION AREDS ORAL) Take by mouth 2 (two) times daily. ? zolpidem (AMBIEN) 10 mg tablet nightly.. No current facility-administered medications for this visit. VITALS: BP (!) 144/62  - Pulse 87  - Temp 97.1 ?F (36.2 ?C) (Temporal)  - Wt 121.6 kg  - SpO2 98%  - BMI 37.92 kg/m?  PHYSICAL EXAM:Gen:  NAD, pleasant HEENT:  EOMI, PEARLLN:  No cervical, supraclavicular, axillary, or inguinal LAD. CV:  No murmurs, mild tachy, no m/r/g Pulm:  CTA b/lAbd:  Soft, no tenderness, no guarding or rebound, no organomegaly, no ascitesExt:  WWP, 1+ symmetric edema in the ankles.  Lymphedema present in the right arm, unchanged.Neuro:   No focal deficitsPsych:  Normal mood and affectSkin:  Scar on the right upper back well healed without evidence of recurrence. Small lateral skin puckering at the edges of the scar stable.  Flaking skin/dryness in the middle portion of the scar.  Right axillary scar well healed.  Scar from LN recurrence well healed. Eczema to umbilical area stable. LABS:Results  for orders placed or performed during the hospital encounter of 02/11/21 TSH w/reflex to FT4     (BH GH LMW Q YH) Result Value Ref Range  Thyroid Stimulating Hormone 2.380 See Comment ?IU/mL Lactate dehydrogenase Result Value Ref Range  LD 152 122 - 241 U/L CBC auto differential Result Value Ref Range  WBC 9.1 4.0 - 11.0 x1000/?L  RBC 4.81 4.00 - 6.00 M/?L  Hemoglobin 14.0 13.2 - 17.1 g/dL  Hematocrit 16.10 96.04 - 50.00 %  MCV 88.6 80.0 - 100.0 fL  MCH 29.1 27.0 - 33.0 pg  MCHC 32.9 31.0 - 36.0 g/dL  RDW-CV 54.0 98.1 - 19.1 %  Platelets 290 150 - 420 x1000/?L  MPV 10.4 8.0 - 12.0 fL  Neutrophils 64.5 39.0 - 72.0 %  Lymphocytes 24.2 17.0 - 50.0 % Monocytes 6.8 4.0 - 12.0 %  Eosinophils 3.5 0.0 - 5.0 %  Basophil 0.8 0.0 - 1.4 %  Immature Granulocytes 0.2 0.0 - 1.0 %  nRBC 0.0 0.0 - 1.0 %  ANC(Abs Neutrophil Count) 5.90 2.00 - 7.60 x 1000/?L  Absolute Lymphocyte Count 2.21 0.60 - 3.70 x 1000/?L  Monocyte Absolute Count 0.62 0.00 - 1.00 x 1000/?L  Eosinophil Absolute Count 0.32 0.00 - 1.00 x 1000/?L  Basophil Absolute Count 0.07 0.00 - 1.00 x 1000/?L  Absolute Immature Granulocyte Count 0.02 0.00 - 0.30 x 1000/?L  Absolute nRBC 0.00 0.00 - 1.00 x 1000/?L Comprehensive metabolic panel Result Value Ref Range  Sodium 138 136 - 144 mmol/L  Potassium 4.3 3.3 - 5.3 mmol/L  Chloride 99 98 - 107 mmol/L  CO2 28 20 - 30 mmol/L  Anion Gap 11 7 - 17  Glucose 143 (H) 70 - 100 mg/dL  BUN 29 (H) 8 - 23 mg/dL  Creatinine 4.78 2.95 - 1.30 mg/dL  Calcium 9.3 8.8 - 62.1 mg/dL  BUN/Creatinine Ratio 30.8 (H) 8.0 - 23.0  Total Protein 7.8 6.6 - 8.7 g/dL  Albumin 4.0 3.6 - 4.9 g/dL  Total Bilirubin 0.3 <=6.5 mg/dL  Alkaline Phosphatase 72 9 - 122 U/L  Alanine Aminotransferase (ALT) 19 9 - 59 U/L  Aspartate Aminotransferase (AST) 20 10 - 35 U/L  Globulin 3.8 (H) 2.3 - 3.5 g/dL  A/G Ratio 1.1 1.0 - 2.2  AST/ALT Ratio 1.1 See Comment  eGFR (Creatinine) >60 >=60 mL/min/1.51m2 IMAGING/PATH:MRI Brain with and without IV ContrastResult Date: 10/3/2022MRI BRAIN W WO IV CONTRAST HISTORY: Melanoma, assess treatment response. COMPARISON: September 18, 2020 TECHNIQUE: The MRI of the brain was performed in multiple pulse sequences in the sagittal, axial and coronal projections. The study was performed without and with the use of contrast. Contrast: 26 cc of DOTAREM IV FINDINGS: There is prominence of the ventricles and sulci consistent with cerebral volume loss. There are numerous foci of increased signal intensity seen within the white matter bilaterally. These white matter abnormalities are nonspecific. These white matter abnormalities may represent chronic ischemic changes due to small vessel disease. The small enhancing lesion described in the medial aspect of the left temporal lobe on the patient's prior study are no longer seen on the current study. No definite cerebral or cerebellar metastases are seen on the current study. There is no intra-axial mass, hemorrhage or acute infarct is seen.  There is no abnormal contrast enhancement.  There is no restricted diffusion.    No cerebral metastases are seen. The small enhancing lesions described in the left temporal lobe on the patient's prior study are not seen on the current study. 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 COMPARISON: [place] chest and abdomen 09/18/2020, 07/05/2020, 04/24/2020 [id number] - Total number of known [place] scans and cardiac nuclear medicine studies within the past 12 months: 9 U0454 - RADIATION DOSE ACQUIRED DURING SCAN: 1309.22 mGy.cm. The [employer] strives for high quality imaging with the lowest possible radiation dose. For more information on medical radiation exposure please visit: [email or web address] . PROCEDURE: The patient was given 75 ml of Omnipaque 350 IV .Marland KitchenCT of the abdomen and pelvis was performed from lung apices to the symphysis pubis utilizing a slice thickness of 5mm. Coronal and sagittal reformatted images were performed on a dependent workstation. Adjustment of mA and/or kV was done according to patient size. FINDINGS: . Chest: Normal heart size without significant pericardial effusion. Advanced three-vessel coronary artery calcification. Fibroatelectasis in the lung bases. Trachea is midline.  Tracheobronchial tree is patent.  Azygos accessory lobe. No focal consolidation, pleural effusion, or pneumothorax. No suspicious pulmonary nodule. Abdomen: LIVER: Unremarkable. No gross lesion. No ductal dilatation. GALLBLADDER AND BILE DUCTS: Cholelithiasis without [place] evidence of acute cholecystitis.. SPLEEN: Unremarkable. PANCREAS: Unremarkable. No gross lesion. No ductal dilatation. ADRENALS: Unremarkable. KIDNEYS/URETERS/BLADDER: Few nonobstructive stone in the right kidney with the largest stone in the upper pole of right kidney measuring 5 mm. Additional smaller nonobstructive stone in the lower pole of right kidney measuring 3 mm. Unchanged cortical irregularity in the left kidney associated with punctate calcification. No hydronephrosis. Urinary bladder is unremarkable.Marland Kitchen PELVIS Limited evaluation secondary to beam hardening from adjacent right hip arthroplasty. STOMACH/BOWEL: Unremarkable. No obstruction. No gross mural thickening. Colonic diverticulosis without diverticulitis. APPENDIX: Normal Appendix PERITONEUM: Fat-containing ventral hernia with hernia neck measuring 1.6 cm in transverse diameter. No free fluid.  No free air. LYMPH NODES: Mild prominent bilateral inguinal lymph nodes, not significant changed. VASCULATURE: Mild atherosclerotic disease. SOFT TISSUES/BONES: No acute osseous abnormality.No acute fracture. Postsurgical changes in the right axilla. Subcutaneous fatty stranding superficial to the scapula. Previously described 1 cm subcutaneous nodule is not identified. OTHER FINDINGS: None.  In this patient with metastatic melanoma, there is a positive response to treatment. 1. No convincing evidence of disease metastasis in the chest or abdomen. 2. Prominent bilateral inguinal lymph nodes, unchanged. Continue follow is recommended. 3. See above for additional findings. Coronal and sagittal reformatted images confirm the above findings. Reported and Signed by:  Helen Hashimoto, MD  07/22/20: SOFT TISSUE, RIGHT UPPER BACK, EXCISION: ? ? ? ?- METASTATIC MELANOMA INVOLVING A LYMPH NODE (1/1) ? ? ? ? ? - SIZE OF THE LARGEST TUMOR DEPOSIT: 11 MM ? ? ? ? ? - SIZE OF THE LYMPH NODE: 1.6 CM WLE and SLN 03/03/19:LYMPH NODES, RIGHT AXILLARY LEVELS 1, 2 AND 3, LYMPH NODE DISSECTION: ?  ? ? - ?THREE OF THIRTY-FIVE LYMPH NODES, POSITIVE FOR MELANOMA (3 OF 35) WITH LARGEST INVOLVED LYMPH NODE MEASURING 4.5 X 3 CM ? ? ?- LYMPHATIC INVASION IDENTIFIED ? ? ?- NO EXTRANODAL EXTENSION IDENTIFIED 1. ?SKIN, BACK, RIGHT UPPER, EXCISION: ? ? ? ?- MALIGNANT MELANOMA, SEE NOTE AND SYNOPTIC SUMMARY Note: Sections show a tumor composed of S100 positive epithelioid cells within the dermis and subcutaneous tissue. Cytokeratin AE1/AE3 and desmin are negative. A junctional component is not identified. The findings would be compatible with a primary dermal melanoma or a metastatic lesion. Clinical correlation  is suggested. 2. ?SKIN, BACK, EXCISION: ? ? ? ?- BENIGN FIBROADIPOSE TISSUE AND SKELETAL MUSCLE SYNOPTIC SUMMARY MELANOMA OF THE SKIN Tumor Site: ? ? Back Laterality: ? ? Right Procedure: ? ? Primary excision Maximum Tumor Thickness (Depth): ? ? 30 mm Ulceration: ? ? Not identified Mitotic Rate (Mitoses/mm2): ? ? 2 Anatomic Level: ? ? V (Melanoma invades subcutis) Growth Phase: ? ? Vertical Histologic Type: ? ? Melanoma, NOS Tumor-Infiltrating Lymphocytes: ? ? Present, non-brisk Tumor Regression: ? ? Not identifed Lymphovascular Invasion: ? ? Present Microsatellitosis: ? ? Present Margins ? ? ?Peripheral Margins: ? ? Uninvolved Deep Margin: ? ? Uninvolved Stage (AJCC 8th Ed): ? ? pT4a Nx Base of the tongue and vallecula random biopsies 02/20/19: 1. ?TONGUE, LEFT, BASE, BIOPSY: ? ? ? ?- LYMPHOID TISSUE HYPERPLASIA ? ? ?- NEGATIVE FOR MALIGNANCY 2. ?THROAT, VALLECULA, BIOPSY: ? ?  ? - LYMPHOID TISSUE HYPERPLASIA AND FOCAL CHRONIC INFLAMMATION. SEE NOTE. ? ? ?- NEGATIVE FOR MALIGNANCY ? ? ? ? ? Note: Immunostain of Sox-10 to investigate focal pigment is negative, supporting above interpretation. 3. ?THROAT, LEFT VALLECULA, BIOPSY: ? ? ? ?- LYMPHOID TISSUE HYPERPLASIA ? ? ?- NEGATIVE FOR MALIGNANCY 4. ?THROAT, RIGHT VALLECULA, BIOPSY: ? ? ? ? ? ? - LYMPHOID TISSUE HYPERPLASIA AND FOCAL CHRONIC INFLAMMATION ? ? ?- NEGATIVE FOR MALIGNANCY 5. ?TONGUE, RIGHT BASE, BIOPSY: ? ? ? ? ? ? - LYMPHOID TISSUE HYPERPLASIA AND FOCAL CHRONIC INFLAMMATION ? ? ?- NEGATIVE FOR MALIGNANCY Pathology (reviewed at [facility]), collected 01/07/19:DIAGNOSIS: ? 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 Clinical pathological correlation is recommended. MICROSCOPIC DESCRIPTION: There are atypical cells in a nodule in the dermis. The cells are positive with SOX-10 and by report are negative with LCA, Kappa, Lambda, and a cytokeratin. The cells are also positive with S-100 by report.ASSESSMENT/PLAN:[name] is a 75 y.o. 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 Duanne Moron on 03/03/19 showing a stage IIIC melanoma that was 30 mm thick, positive for LVI and microsatellites with 3/25 positive LNs.  Given the thickness of his primary melanoma and microsatellitosis, we felt that his melanoma-specific survival is closer to a stage IIID:  60% over 10 years with stage IIIC disease versus 24% with stage IIID.  He opted to proceed with the Moderna clinical trial (?A Phase 2 Randomized Study of Adjuvant Immunotherapy with the Personalized Cancer Vaccine [id number] and Pembrolizumab Versus Pembrolizumab Alone After Complete Resection of High-Risk Melanoma?) and completed 1 year of adjuvant therapy on trial without issues.  He developed an enlarged 1 cm SQ nodule overlying the right scapula noted on scans 07/05/20 s/p excision showing a 1.6 cm LN containing a 11 mm deposit of melanoma.MRI brain 09/18/20 showed a 8 mm left hippocampus lesion and a 6 mm lesion in the left mesial temporal lobe concerning for brain mets.  Reviewed the case with Dr. Winfred Leeds; lesions are too small and ill-defined to biopsy and would hold off on GKRS for now.  Given high suspicion for metastatic disease, recurrence of his disease recently, and fairly good performance status, would opt to be aggressive.  I reviewed with Jason Rowland that ideally, we would want to get a biopsy to establish stage IV disease, but given that we know his disease is aggressive and presented again shortly after discontinuation of  adjuvant anti-PD-1, we can presume that this is metastatic disease and start a new line of systemic therapy.  Discussed the available brain met clinical trials, but he wants to move to [facility] and would like to pursue standard of care. Discussed data from CheckMate 204 utilizing ipi 3 + nivo 1 in brain metastasis which demonstrated similar intracranial and extracranial efficacy.  Jason Rowland agreed to proceed; thus far tx has been complicated by G1-2 diarrhea which is well controlled with imodium/lomotil.- labs reviewed, BUN 28, will follow, encouraged to increase oral intake, glucose 143 - improved from previous visits. CBC unremarkable. TSH 2.380- Ok to proceed with Nivo today.- Diarrhea:  prior stool samples neg for cdiff and fecal leukocytes. calprotectin 51 (intermediate).  Continue to take imodium 2 tablets in the am and with each BM 1 tablet - max of 16mg . Lomotil if he has more than 3 episodes of diarrhea even after taking he imodium.  BRAT diet.  Refilled lomotil and imodium for him.- uveitis recently ruled out.  Ongoing blurriness to f/u with his eye doctor; was told he had macular degeneration. F/u appt 04/2021- MRI brain and [place] cap done 01/16/21; positive response to  Treatment; repeat scans in 3 months- routine f/u with dermatologist for FBSEs- RTC in 4 weeks for nivo monotherapy; next appt 11/25 and then 05/06/21. Scans the week of 04/2021- plans to visit his daughter [name] in [place] during the holidays Era Bumpers APRN FNP BCDiscussed the case with Dr. Laveda Norman and Dr. Laveda Norman will not bill for services related to this encounter

## 2021-02-15 ENCOUNTER — Telehealth: Admit: 2021-02-15 | Payer: PRIVATE HEALTH INSURANCE

## 2021-02-15 NOTE — Telephone Encounter
Called and spoke to andyGave him his schedule for 11/25,1/17, and 1/20 he verbalized understandingHe reports on sunda he had 8- 10 episodes of diarrhea. Only took I dose of lomotil and 2 doses of imodium. He said he ate 3 corn on the cobs with lots of butter and he thinks in combination with is usually diarrhea post treatment he made the sxs worseAt this time he is feeling better 2 bms today, one loose one formed. He has been taking 1 tablet of imodium in the morning with good relief.I called dr Pecolia Ades the optho to get the most recent appt notes. [name] was last seen in august no visit in October.And reports b/l eye haziness at times with photosenisitivyt. Denies floaters. He said that the eye sxs are not constant and today he feels no vision issues.Asked the optho office to fax over the office nose. [name] knows the concerning sxs re: eye complaints and knows to call our office.He verbalized understanding and we will see him 11/25 in [place] for treatment Electronically Signed by Bo Mcclintock, APRN, February 15, 2021

## 2021-02-15 NOTE — Telephone Encounter
Hello Team, Can you please book the following: The patient does not need a call next appt 11/25 Labs 910am Md appt 930am - on my template And then tx chair for nivo - 1 hour ?05/06/21: Labs 940am Md appt 10am 1hour chair for nivo Thank you [employer]

## 2021-02-25 ENCOUNTER — Encounter: Admit: 2021-02-25 | Payer: PRIVATE HEALTH INSURANCE

## 2021-03-11 ENCOUNTER — Encounter: Admit: 2021-03-11 | Payer: PRIVATE HEALTH INSURANCE

## 2021-03-11 ENCOUNTER — Ambulatory Visit: Admit: 2021-03-11 | Payer: BLUE CROSS/BLUE SHIELD

## 2021-03-11 ENCOUNTER — Inpatient Hospital Stay: Admit: 2021-03-11 | Discharge: 2021-03-11 | Payer: BLUE CROSS/BLUE SHIELD

## 2021-03-11 ENCOUNTER — Ambulatory Visit: Admit: 2021-03-11 | Payer: BLUE CROSS/BLUE SHIELD | Attending: Clinical

## 2021-03-11 DIAGNOSIS — Z79899 Other long term (current) drug therapy: Secondary | ICD-10-CM

## 2021-03-11 DIAGNOSIS — H3530 Unspecified macular degeneration: Secondary | ICD-10-CM

## 2021-03-11 DIAGNOSIS — R197 Diarrhea, unspecified: Secondary | ICD-10-CM

## 2021-03-11 DIAGNOSIS — Z7982 Long term (current) use of aspirin: Secondary | ICD-10-CM

## 2021-03-11 DIAGNOSIS — Z5112 Encounter for antineoplastic immunotherapy: Secondary | ICD-10-CM

## 2021-03-11 DIAGNOSIS — Z794 Long term (current) use of insulin: Secondary | ICD-10-CM

## 2021-03-11 DIAGNOSIS — E785 Hyperlipidemia, unspecified: Secondary | ICD-10-CM

## 2021-03-11 DIAGNOSIS — E119 Type 2 diabetes mellitus without complications: Secondary | ICD-10-CM

## 2021-03-11 DIAGNOSIS — Z7984 Long term (current) use of oral hypoglycemic drugs: Secondary | ICD-10-CM

## 2021-03-11 DIAGNOSIS — R21 Rash and other nonspecific skin eruption: Secondary | ICD-10-CM

## 2021-03-11 DIAGNOSIS — Z7952 Long term (current) use of systemic steroids: Secondary | ICD-10-CM

## 2021-03-11 DIAGNOSIS — Z791 Long term (current) use of non-steroidal anti-inflammatories (NSAID): Secondary | ICD-10-CM

## 2021-03-11 DIAGNOSIS — C7931 Secondary malignant neoplasm of brain: Secondary | ICD-10-CM

## 2021-03-11 DIAGNOSIS — Z79890 Hormone replacement therapy (postmenopausal): Secondary | ICD-10-CM

## 2021-03-11 DIAGNOSIS — C4359 Malignant melanoma of other part of trunk: Secondary | ICD-10-CM

## 2021-03-11 DIAGNOSIS — C773 Secondary and unspecified malignant neoplasm of axilla and upper limb lymph nodes: Secondary | ICD-10-CM

## 2021-03-11 DIAGNOSIS — E1142 Type 2 diabetes mellitus with diabetic polyneuropathy: Secondary | ICD-10-CM

## 2021-03-11 DIAGNOSIS — Z96641 Presence of right artificial hip joint: Secondary | ICD-10-CM

## 2021-03-11 DIAGNOSIS — N200 Calculus of kidney: Secondary | ICD-10-CM

## 2021-03-11 DIAGNOSIS — E7800 Pure hypercholesterolemia, unspecified: Secondary | ICD-10-CM

## 2021-03-11 LAB — COMPREHENSIVE METABOLIC PANEL
BKR A/G RATIO: 1 (ref 1.0–2.2)
BKR ALANINE AMINOTRANSFERASE (ALT): 28 U/L — CR (ref 9–59)
BKR ALBUMIN: 3.9 g/dL (ref 3.6–4.9)
BKR ALKALINE PHOSPHATASE: 77 U/L (ref 9–122)
BKR ANION GAP: 9 (ref 7–17)
BKR ASPARTATE AMINOTRANSFERASE (AST): 28 U/L (ref 10–35)
BKR AST/ALT RATIO: 1
BKR BILIRUBIN TOTAL: 0.3 mg/dL (ref 0.0–<=1.2)
BKR BLOOD UREA NITROGEN: 33 mg/dL — ABNORMAL HIGH (ref 8–23)
BKR BUN / CREAT RATIO: 26.2 — ABNORMAL HIGH (ref 8.0–23.0)
BKR CALCIUM: 9.2 mg/dL (ref 8.8–10.2)
BKR CHLORIDE: 101 mmol/L (ref 98–107)
BKR CO2: 28 mmol/L (ref 20–30)
BKR CREATININE: 1.26 mg/dL (ref 0.40–1.30)
BKR EGFR, CREATININE (CKD-EPI 2021): 60 mL/min/{1.73_m2} (ref >=60–1.00)
BKR GLOBULIN: 3.9 g/dL — ABNORMAL HIGH (ref 2.3–3.5)
BKR GLUCOSE: 230 mg/dL — ABNORMAL HIGH (ref 70–100)
BKR POTASSIUM: 4.4 mmol/L — ABNORMAL LOW (ref 3.3–5.3)
BKR PROTEIN TOTAL: 7.8 g/dL (ref 6.6–8.7)
BKR SODIUM: 138 mmol/L (ref 136–144)

## 2021-03-11 LAB — CBC WITH AUTO DIFFERENTIAL
BKR WAM ABSOLUTE IMMATURE GRANULOCYTES.: 0.03 x 1000/??L (ref 0.00–0.30)
BKR WAM ABSOLUTE LYMPHOCYTE COUNT.: 2.39 x 1000/??L (ref 0.60–3.70)
BKR WAM ABSOLUTE NRBC (2 DEC): 0 x 1000/??L (ref 0.00–1.00)
BKR WAM ANALYZER ANC: 5.9 x 1000/??L (ref 2.00–7.60)
BKR WAM BASOPHIL ABSOLUTE COUNT.: 0.07 x 1000/??L (ref 0.00–1.00)
BKR WAM BASOPHILS: 0.7 % (ref 0.0–1.4)
BKR WAM EOSINOPHIL ABSOLUTE COUNT.: 0.41 x 1000/??L (ref 0.00–1.00)
BKR WAM EOSINOPHILS: 4.3 % (ref 0.0–5.0)
BKR WAM HEMATOCRIT (2 DEC): 39.1 % (ref 38.50–50.00)
BKR WAM HEMOGLOBIN: 12.9 g/dL — ABNORMAL LOW (ref 13.2–17.1)
BKR WAM IMMATURE GRANULOCYTES: 0.3 % (ref 0.0–1.0)
BKR WAM LYMPHOCYTES: 24.9 % (ref 17.0–50.0)
BKR WAM MCH (PG): 29.5 pg (ref 27.0–33.0)
BKR WAM MCHC: 33 g/dL — ABNORMAL HIGH (ref 31.0–36.0)
BKR WAM MCV: 89.3 fL (ref 80.0–100.0)
BKR WAM MONOCYTE ABSOLUTE COUNT.: 0.78 x 1000/??L (ref 0.00–1.00)
BKR WAM MONOCYTES: 8.1 % (ref 4.0–12.0)
BKR WAM MPV: 10.2 fL (ref 8.0–12.0)
BKR WAM NEUTROPHILS: 61.7 % — ABNORMAL HIGH (ref 39.0–72.0)
BKR WAM NUCLEATED RED BLOOD CELLS: 0 % (ref 0.0–1.0)
BKR WAM PLATELETS: 250 x1000/??L (ref 150–420)
BKR WAM RDW-CV: 14 % — ABNORMAL HIGH (ref 11.0–15.0)
BKR WAM RED BLOOD CELL COUNT.: 4.38 M/??L (ref 4.00–6.00)
BKR WAM WHITE BLOOD CELL COUNT: 9.6 x1000/??L (ref 4.0–11.0)

## 2021-03-11 LAB — LACTATE DEHYDROGENASE: BKR LACTATE DEHYDROGENASE: 173 U/L (ref 122–241)

## 2021-03-11 LAB — TSH W/REFLEX TO FT4     (BH GH LMW Q YH): BKR THYROID STIMULATING HORMONE: 4.98 ??IU/mL — ABNORMAL HIGH

## 2021-03-11 MED ORDER — NIVOLUMAB INFUSION
Freq: Once | INTRAVENOUS | Status: CP
Start: 2021-03-11 — End: ?
  Administered 2021-03-11: 16:00:00 100.000 mL/h via INTRAVENOUS

## 2021-03-11 MED ORDER — SODIUM CHLORIDE 0.9 % BOLUS (NEW BAG)
0.9 % | Freq: Once | INTRAVENOUS | Status: CP
Start: 2021-03-11 — End: ?
  Administered 2021-03-11: 15:00:00 0.9 mL/h via INTRAVENOUS

## 2021-03-11 NOTE — Patient Instructions
Please call office if QMV:HQIONGE a temperature of 100.4 or higher with or without shaking chillsdevelop a cough with yellow or green sputumhave urinary frequency, urgency or other infectious processeshave nausea or vomiting that is not well controlled with home medicationshave constipation or diarrhea that is not well controlled with home medicationsnotice unusual bleeding or bruises, blood in sputum, urine or stool have any falls where you hit your headExperience poor oral intake or signs or symptoms os dehydration (dizziness upon standing, very dry mucous membranes or dark concentrated urine)Remember to:Wash hands and utilize good hand hygiene before and after using the bathroom and before eatingAvoid sick family members and friendsConsume small frequent meals high in protein and caloriesDrink 6-8 glasses of non-caffeine, non-alcoholic beverages daily for adequate hydration

## 2021-03-11 NOTE — Progress Notes
Patient arrived today for Opdivo infusion.CBC CMP reviewed, ok to treat.States vision is foggy on and off, not worsening, and brightness is bothering him.Had 4-5 loose bowel movements after last treatment after thanksgiving dinner. Taking imodium and lomotil as instructed. Patient verbalizes understanding when and how to take it.LAF 22g IV with + bld return, Opdivo infused over 30 min, tolerated well. NS 1 L over 2 hours, BUN 33.Rash around umbilicus. Patient states has cream but is not consistently putting it on. Encouraged to do so. Also seeing Dermatologist.Patient instructed to call MD for worsening diarrhea not relieved by his medication or worsening of the rash, or other side effects he might experiences.Patient verbalizes understanding.RTC on 05/06/2021 for labs and treatment??

## 2021-03-11 NOTE — Progress Notes
NAME:  Jason Rowland (09/25/1945)AGE: 75 y.o. MRN:  [id number] PROVIDER: Era Bumpers APRNDATE OF SERVICE: 03/11/2021 [facility] [place]  REASON FOR VISIT:  Ongoing treatment ONC DX:  Invasive melanoma right mid backSTAGE:  Likely IV (pT4a N3 M1d(0) - originally stage IIIC with 30 mm with microsatellitosis, mitotic rate 2 per mm2).  Noted 09/18/20 to have new brain lesions (too small for biopsy or GKRS).MOLECULAR PROFILING:0% tumor and immune cell PD-L1 staining50-gene panel:  DNA VARIANT DETECTED ? ? 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 ? 74% PRIOR TX:  - WLE and SLNB 03/03/19- Adjuvant Moderna clinical trial (?A Phase 2 Randomized Study of Adjuvant Immunotherapy with the Personalized Cancer Vaccine [phone] and Pembrolizumab Versus Pembrolizumab Alone After Complete Resection of High-Risk Melanoma? Protocol [id number], HIC 47829) C1D1 05/08/19. Last dose 04/27/20. Randomized to the pembrolizumab and vaccine arm.- 07/22/20 resection of recurrent LN in the right upper back (1.6 cm LN with 1.1 cm tumor deposit)ACTIVE TX:   ipilimumab (3 mg/kg) + nivolumab (1 mg/kg) - (10/08/20 - )Onc Hx: [name] (prefers to be called Jason Rowland) is a 75 y.o. male who worked for [employer] (retired at the end of 02/2019 after working there for 50 years) with a PMH of DM2 complicated by peripheral neuropathy, HLD, and AKs who is referred by Joanie Coddington, his dermatologist for a right back metastatic melanoma.  He reports it appeared approx 10 months prior; his PCP originally thought it was a pimple.  Dr. Lorn Junes at [facility] later evaluated it and also thought it was a pimple and injected it with intralesional kenalog x 2 and oral cephalexin without improvement.  The lesion increased in size and burned whenever he leaned against anything.  Denies night sweats and weight loss.  Lucie Leather did a I&D on 01/07/19  with only slight bloody discharge, so a punch biopsy was obtain with path showing metastatic melanoma.  On 01/20/19, additional biopsies were done to try to evaluate a primary site, but we do not have this tissue.  Per patient report, these biopsies were negative. MRI brain 01/30/19 without intracranial disease.  PET done 02/07/19 showing preepiglottic FDG activity s/p ENT evaluation which was negative for any visible malignancy.  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 LDH elevated to 251 at diagnosis. His case was presented at the [facility] Melanoma tumor board on 02/13/19 and consensus was to ask ENT for random biopsies of the BOT and pre-epiglottic PET-avid areas, as we do not want to miss a head and neck cancer but otherwise not felt to be related to his melanoma.  The cervical LN were not thought to be related.  He should also have gastroenterology evaluation for a PET-avid stranding lesion in the transverse colon.  Dr. Landis Gandy (ENT) performed BOT and vallecula random biopsies 02/20/19 which only showed chronic inflammation and no malignancy.  He had a colonoscopy by Dr. Linton Flemings on 02/27/19 showing only a tubular adenoma in the sigmoid colon.  Overall, it was felt that his right mid back lesion is the site of his primary disease, and the metastatic melanoma pathology interpretation may have been complicated due to repeat I&Ds and steroid injections that may have altered the architecture of the primary lesion.  In the absence of any confirmed metastatic disease, he underwent WLE and LND with Dr. Duanne Moron on 03/03/19.  Final path showed a 30 mm thick melanoma, 2 mitoses/mm2, non-brisk TILs, no tumor regression, LVI+, microsatellites+, negative margins, and 3/35 lymph nodes (largest LN  4.5 x 3 cm) were positive for melanoma with presence of lymphatic invasion without extranodal extension.He uses a cane sometimes as he has some balance issues due to neuropathy in his feet.  Otherwise, he is fully functional, active, and independent in ADLs/iADLs.  No hx of autoimmune disease.03/27/19 signed consent for the Moderna trial; randomized to pembro and vaccine arm.  Started treatment C1D1 05/08/19.04/24/19 noted to have wound dehiscence, non-infected, healing by secondary intent.  Re-instated VNA services to assist with dressing changes and packing.  Wound completely healed by 06/20/19.10/27/19, 01/12/20, and 04/24/20 surveillance scans NED.07/05/20 - [place] chest showing an increased 1.0 cm subcutaneous nodule in the right upper back.  Bx 07/06/20 not diagnostic.  Excision by Dr. Duanne Moron 07/22/20 showed metastatic melanoma involving 1 LN with 11 mm tumor deposit in a 1.6 cm LN.   09/18/20 - MRI brain showing a 8 mm left hippocampus lesion and a 6 mm lesion in the left mesial temporal lobe.  Reports new subtle memory issues.Interval ZO:XWRU returns for follow-up & nivo treatment today.Reports 1-2 episodes per week of  occasional diarrhea. Takes imodium or lomotil prn and diarrhea subsides. He did have diarrhea  last night after eating lobster bisque >5 episodes. No issues this am He reports vision is clear today has occasional dry eyes and uses visine Had an optho appt on 10/12 seen by another provider and was told he had  - macular degeneration. No other  concerns. No intervention or prescriptions ordered. His Next appt is in 04/2021. Chronic eczema rash around umbilical area not causing pain, not itchy. Energy is stable. He often is up to midnight watching TV and wakes early; so he takes naps if he is tiredClose friend recently passed away unexpectedly. Will be going to [place] to see his daughter [name] for a few weeks. ECOG:  1Pain: 0/10Review of Systems:No fevers, chills, night sweats, lightheadedness, HA, CP, palpitations, wheezing, abd pain, constipation, blood in the urine or stools, dysuria.  Diabetic neuropathy in the hands and feet stable.   Positive responses on ROS as noted in interval hx. PAST MEDICAL HISTORY:Past Medical History: Diagnosis Date ? Diabetes mellitus (HC Code)  ? High cholesterol  ? Kidney stones   40 years ago ? 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 Malignant melanoma of torso excluding breast (HC Code)  SURGICAL HISTORY:Past Surgical History: Procedure Laterality Date ? COLONOSCOPY   ? CORONARY ANGIOPLASTY WITH STENT PLACEMENT Bilateral   patient denies ? laryngoscopy with biopsy  02/20/2019 ? ROTATOR CUFF REPAIR Left 1999 ? TONSILLECTOMY    age 44 ? TOTAL HIP ARTHROPLASTY Right 2002 FAMILY HISTORY:Family History Problem Relation Age of Onset ? Heart disease Mother  ? Bladder cancer Father 85 ? Kidney cancer Sister  ? Leukemia Brother 75 ? No Known Problems Daughter  ? No Known Problems Son  SOCIAL HISTORY:Social History Socioeconomic History ? Marital status: Divorced   Spouse name: Not on file ? Number of children: Not on file ? Years of education: Not on file ? Highest education level: Not on file Occupational History ? Not on file Tobacco Use ? Smoking status: Never Smoker ? Smokeless tobacco: Never Used Vaping Use ? Vaping Use: Never used Substance and Sexual Activity ? Alcohol use: Not Currently ? Drug use: No ? Sexual activity: Not on file Other Topics Concern ? Not on file Social History Narrative  Divorced, works for [employer] in [place] will be  retiring 03/17/19.  Has 1 son in [place] and 1 daughter in [place]. Llives in [place]  Social Determinants of Health Financial Resource Strain: Not on file Food Insecurity: Not on file Transportation Needs: Not on file Physical Activity: Not on file Stress: Not on file Social Connections: Not on file Intimate Partner Violence: Not on file Housing Stability: Not on file ALLERGIES:No Known AllergiesMEDICATIONS:Current Outpatient Medications Medication Sig ? acetaminophen (TYLENOL) 325 mg tablet Take 3 tablets (975 mg total) by mouth every 6 (six) hours as needed (mild to moderate pain). ? ALPRAZolam (XANAX) 0.5 mg tablet Take 0.5 mg by mouth as needed. ? aspirin 81 MG EC tablet Take 81 mg by mouth nightly.  ? diphenoxylate-atropine (LOMOTIL) 2.5-0.025 mg per tablet Take 1 tablet by mouth 4 (four) times daily as needed for diarrhea. ? FREESTYLE LIBRE 14 DAY sensor kit 1 each by Other route every 14 (fourteen) days. ? glipiZIDE (GLUCOTROL XL) 10 MG 24 hr tablet Take 10 mg by mouth nightly.  ? ibuprofen (ADVIL,MOTRIN) 200 mg tablet Take 2 tablets (400 mg total) by mouth every 6 (six) hours as needed (alternate with tylenol for mild to moderate pain). ? indomethacin (INDOCIN) 25 mg capsule 3 (three) times daily. ? levothyroxine (SYNTHROID, LEVOTHROID) 50 MCG tablet Take 50 mcg by mouth daily. ? loperamide (IMODIUM A-D) 2 mg tablet Take 1 tablet (2 mg total) by mouth See Admin Instructions. 4mg  ( 2 tablets ) in the morning. And then with each bm take an additional one table . Max dose per day 16mg  ? olopatadine (PATANOL) 0.1 % ophthalmic solution as needed (takes for eye irritation as needed). ? ondansetron (ZOFRAN-ODT) 8 mg disintegrating tablet Place 1 tablet (8 mg total) onto the tongue every 8 (eight) hours as needed for nausea. ? predniSONE (DELTASONE) 20 mg tablet Take 3 tablets (60 mg total) by mouth daily. Take with food. Please speak to office before starting this medication ? simvastatin (ZOCOR) 20 MG tablet Take 20 mg by mouth nightly.  ? TOUJEO SOLOSTAR U-300 300 unit/mL (1.5 mL) pen Inject 70 Units under the skin nightly. ? triamcinolone (KENALOG) 0.1 % ointment Apply topically 2 (two) times daily as needed. To areas of rash. ? vit A/vit C/vit E/zinc/copper (PRESERVISION AREDS ORAL) Take by mouth 2 (two) times daily. ? zolpidem (AMBIEN) 10 mg tablet nightly.. No current facility-administered medications for this visit. VITALS: BP (!) 159/82  - Pulse 65  - Temp 98.2 ?F (36.8 ?C) (Temporal)  - Resp 18  - Wt 122 kg  - SpO2 97%  - BMI 38.05 kg/m?  PHYSICAL EXAM:Gen:  NAD, pleasant HEENT:  EOMI, PEARLLN:  No cervical, supraclavicular, axillary, or inguinal LAD. CV:  No murmurs, mild tachy, no m/r/g Pulm:  CTA b/lAbd:  Soft, no tenderness, no guarding or rebound, no organomegaly, no ascitesExt:  WWP, 1+ symmetric edema in the ankles.  Lymphedema present in the right arm, unchanged.Neuro:   No focal deficitsPsych:  Normal mood and affectSkin:  Scar on the right upper back well healed without evidence of recurrence. Small lateral skin puckering at the edges of the scar stable.  Flaking skin/dryness in the middle portion of the scar.  Right axillary scar well healed.  Scar from LN recurrence well healed. Eczema to umbilical area stable. LABS:Results for orders placed or performed in visit on 03/11/21 TSH w/reflex to FT4     (BH GH LMW Q YH) Result Value Ref Range  Thyroid Stimulating Hormone 4.980 (H) See Comment ?IU/mL Lactate dehydrogenase Result Value Ref  Range  LD 173 122 - 241 U/L CBC auto differential Result Value Ref Range  WBC 9.6 4.0 - 11.0 x1000/?L  RBC 4.38 4.00 - 6.00 M/?L  Hemoglobin 12.9 (L) 13.2 - 17.1 g/dL  Hematocrit 56.21 30.86 - 50.00 %  MCV 89.3 80.0 - 100.0 fL  MCH 29.5 27.0 - 33.0 pg  MCHC 33.0 31.0 - 36.0 g/dL  RDW-CV 57.8 46.9 - 62.9 %  Platelets 250 150 - 420 x1000/?L  MPV 10.2 8.0 - 12.0 fL  Neutrophils 61.7 39.0 - 72.0 %  Lymphocytes 24.9 17.0 - 50.0 %  Monocytes 8.1 4.0 - 12.0 %  Eosinophils 4.3 0.0 - 5.0 %  Basophil 0.7 0.0 - 1.4 %  Immature Granulocytes 0.3 0.0 - 1.0 %  nRBC 0.0 0.0 - 1.0 %  ANC(Abs Neutrophil Count) 5.90 2.00 - 7.60 x 1000/?L  Absolute Lymphocyte Count 2.39 0.60 - 3.70 x 1000/?L  Monocyte Absolute Count 0.78 0.00 - 1.00 x 1000/?L  Eosinophil Absolute Count 0.41 0.00 - 1.00 x 1000/?L  Basophil Absolute Count 0.07 0.00 - 1.00 x 1000/?L  Absolute Immature Granulocyte Count 0.03 0.00 - 0.30 x 1000/?L  Absolute nRBC 0.00 0.00 - 1.00 x 1000/?L Comprehensive metabolic panel Result Value Ref Range  Sodium 138 136 - 144 mmol/L  Potassium 4.4 3.3 - 5.3 mmol/L  Chloride 101 98 - 107 mmol/L  CO2 28 20 - 30 mmol/L  Anion Gap 9 7 - 17  Glucose 230 (H) 70 - 100 mg/dL  BUN 33 (H) 8 - 23 mg/dL  Creatinine 5.28 4.13 - 1.30 mg/dL  Calcium 9.2 8.8 - 24.4 mg/dL  BUN/Creatinine Ratio 01.0 (H) 8.0 - 23.0  Total Protein 7.8 6.6 - 8.7 g/dL  Albumin 3.9 3.6 - 4.9 g/dL  Total Bilirubin 0.3 <=2.7 mg/dL  Alkaline Phosphatase 77 9 - 122 U/L  Alanine Aminotransferase (ALT) 28 9 - 59 U/L  Aspartate Aminotransferase (AST) 28 10 - 35 U/L  Globulin 3.9 (H) 2.3 - 3.5 g/dL  A/G Ratio 1.0 1.0 - 2.2  AST/ALT Ratio 1.0 See Comment  eGFR (Creatinine) 60 >=60 mL/min/1.8m2  Latest Reference Range & Units 03/11/21 09:11 Thyroid Stimulating Hormone, 3rd Gen. See Comment ?IU/mL 4.980 (H) IMAGING/PATH:No results found. 07/22/20: SOFT TISSUE, RIGHT UPPER BACK, EXCISION: ? ? ? ?- METASTATIC MELANOMA INVOLVING A LYMPH NODE (1/1) ? ? ? ? ? - SIZE OF THE LARGEST TUMOR DEPOSIT: 11 MM ? ? ? ? ? - SIZE OF THE LYMPH NODE: 1.6 CM WLE and SLN 03/03/19:LYMPH NODES, RIGHT AXILLARY LEVELS 1, 2 AND 3, LYMPH NODE DISSECTION: ?  ? ? - ?THREE OF THIRTY-FIVE LYMPH NODES, POSITIVE FOR MELANOMA (3 OF 35) WITH LARGEST INVOLVED LYMPH NODE MEASURING 4.5 X 3 CM ? ? ?- LYMPHATIC INVASION IDENTIFIED ? ? ?- NO EXTRANODAL EXTENSION IDENTIFIED 1. ?SKIN, BACK, RIGHT UPPER, EXCISION: ? ? ? ?- MALIGNANT MELANOMA, SEE NOTE AND SYNOPTIC SUMMARY Note: Sections show a tumor composed of S100 positive epithelioid cells within the dermis and subcutaneous tissue. Cytokeratin AE1/AE3 and desmin are negative. A junctional component is not identified. The findings would be compatible with a primary dermal melanoma or a metastatic lesion. Clinical correlation is suggested. 2. ?SKIN, BACK, EXCISION: ? ? ? ?- BENIGN FIBROADIPOSE TISSUE AND SKELETAL MUSCLE SYNOPTIC SUMMARY MELANOMA OF THE SKIN Tumor Site: ? ? Back Laterality: ? ? Right Procedure: ? ? Primary excision Maximum Tumor Thickness (Depth): ? ? 30 mm Ulceration: ? ? Not identified Mitotic Rate (Mitoses/mm2): ? ? 2 Anatomic Level: ? ?  V (Melanoma invades subcutis) Growth Phase: ? ? Vertical Histologic Type: ? ? Melanoma, NOS Tumor-Infiltrating Lymphocytes: ? ? Present, non-brisk Tumor Regression: ? ? Not identifed Lymphovascular Invasion: ? ? Present Microsatellitosis: ? ? Present Margins ? ? ?Peripheral Margins: ? ? Uninvolved Deep Margin: ? ? Uninvolved Stage (AJCC 8th Ed): ? ? pT4a Nx Base of the tongue and vallecula random biopsies 02/20/19: 1. ?TONGUE, LEFT, BASE, BIOPSY: ? ? ? ?- LYMPHOID TISSUE HYPERPLASIA ? ? ?- NEGATIVE FOR MALIGNANCY 2. ?THROAT, VALLECULA, BIOPSY: ? ?  ? - LYMPHOID TISSUE HYPERPLASIA AND FOCAL CHRONIC INFLAMMATION. SEE NOTE. ? ? ?- NEGATIVE FOR MALIGNANCY ? ? ? ? ? Note: Immunostain of Sox-10 to investigate focal pigment is negative, supporting above interpretation. 3. ?THROAT, LEFT VALLECULA, BIOPSY: ? ? ? ?- LYMPHOID TISSUE HYPERPLASIA ? ? ?- NEGATIVE FOR MALIGNANCY 4. ?THROAT, RIGHT VALLECULA, BIOPSY: ? ? ? ? ? ? - LYMPHOID TISSUE HYPERPLASIA AND FOCAL CHRONIC INFLAMMATION ? ? ?- NEGATIVE FOR MALIGNANCY 5. ?TONGUE, RIGHT BASE, BIOPSY: ? ? ? ? ? ? - LYMPHOID TISSUE HYPERPLASIA AND FOCAL CHRONIC INFLAMMATION ? ? ?- NEGATIVE FOR MALIGNANCY Pathology (reviewed at [facility]), collected 01/07/19:DIAGNOSIS: ? 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 Clinical pathological correlation is recommended. MICROSCOPIC DESCRIPTION: There are atypical cells in a nodule in the dermis. The cells are positive with SOX-10 and by report are negative with LCA, Kappa, Lambda, and a cytokeratin. The cells are also positive with S-100 by report.ASSESSMENT/PLAN:[name] is a 75 y.o. 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 Duanne Moron on 03/03/19 showing a stage IIIC melanoma that was 30 mm thick, positive for LVI and microsatellites with 3/25 positive LNs.  Given the thickness of his primary melanoma and microsatellitosis, we felt that his melanoma-specific survival is closer to a stage IIID:  60% over 10 years with stage IIIC disease versus 24% with stage IIID.  He opted to proceed with the Moderna clinical trial (?A Phase 2 Randomized Study of Adjuvant Immunotherapy with the Personalized Cancer Vaccine [id number] and Pembrolizumab Versus Pembrolizumab Alone After Complete Resection of High-Risk Melanoma?) and completed 1 year of adjuvant therapy on trial without issues.  He developed an enlarged 1 cm SQ nodule overlying the right scapula noted on scans 07/05/20 s/p excision showing a 1.6 cm LN containing a 11 mm deposit of melanoma.MRI brain 09/18/20 showed a 8 mm left hippocampus lesion and a 6 mm lesion in the left mesial temporal lobe concerning for brain mets.  Reviewed the case with Dr. Winfred Leeds; lesions are too small and ill-defined to biopsy and would hold off on GKRS for now.  Given high suspicion for metastatic disease, recurrence of his disease recently, and fairly good performance status, would opt to be aggressive.  I reviewed with Jason Rowland that ideally, we would want to get a biopsy to establish stage IV disease, but given that we know his disease is aggressive and presented again shortly after discontinuation of adjuvant anti-PD-1, we can presume that this is metastatic disease and start a new line of systemic therapy.  Discussed the available brain met clinical trials, but he wants to move to [facility] and would like to pursue standard of care. Discussed data from [employer] utilizing ipi 3 + nivo 1 in brain metastasis which demonstrated similar intracranial  and extracranial efficacy.  Jason Rowland agreed to proceed; thus far tx has been complicated by G1-2 diarrhea which is well controlled with imodium/lomotil.- labs reviewed, BUN 33, will follow, encouraged to increase oral water intake, glucose 230. Hgb 12.9 CBC, TSH 4.980; will monitor- Ok to proceed with Nivo cycle 7 and will give a liter of NS today. No prior hx of cardiac concerns- Diarrhea:  prior stool samples neg for cdiff and fecal leukocytes. calprotectin 51 (intermediate).  Continue to take imodium 2 tablets in the am and with each BM 1 tablet - max of 16mg . Lomotil if he has more than 3 episodes of diarrhea even after taking he imodium.  BRAT diet.  Refilled lomotil and imodium for him.- Asked Jason Rowland to keep a log of his episodes of diarrhea and what medication he takes. He has a prednisone rx that he is not to take until he speaks to the team. - uveitis recently ruled out.  Ongoing intermittent blurry vision f/u with his eye doctor on 04/2021; was told he had macular degeneration. - MRI brain and [place] cap done 01/16/21; positive response to  Treatment; repeat scans in 3 months- routine f/u with dermatologist for FBSEs: every 4 months. Next appt is sometime in January - RTC in 4 weeks for nivo monotherapy; 05/06/21. Scans the week of 04/2021- plans to visit his daughter [name] in [place] during the holidays Era Bumpers APRN FNP BCDiscussed the case with Dr. Laveda Norman and Dr. Laveda Norman will not bill for services related to this encounter

## 2021-03-15 ENCOUNTER — Ambulatory Visit: Admit: 2021-03-15 | Payer: PRIVATE HEALTH INSURANCE

## 2021-03-18 ENCOUNTER — Telehealth: Admit: 2021-03-18 | Payer: PRIVATE HEALTH INSURANCE

## 2021-03-18 NOTE — Telephone Encounter
Called Jason Rowland, he said he is doing well. Has all his medications ready to bring to [facility]. Since last visit he has not episodes of diarrhea greater than one. Taking imodium and lomitol prn. Usually will take imodium if needed.  He has the prednisone rx however knows to call us at [phone] if he has persistent diarrhea, also received permission to call [facility] while he is with her. Provided her cell phone and added to patient contact. No further questions has f/u scheduled in january Electronically Signed by Bo Mcclintock, APRN, March 18, 2021

## 2021-04-25 ENCOUNTER — Telehealth: Admit: 2021-04-25 | Payer: PRIVATE HEALTH INSURANCE

## 2021-04-25 ENCOUNTER — Encounter: Admit: 2021-04-25 | Payer: PRIVATE HEALTH INSURANCE

## 2021-04-25 DIAGNOSIS — C7931 Secondary malignant neoplasm of brain: Secondary | ICD-10-CM

## 2021-04-25 DIAGNOSIS — C4359 Malignant melanoma of other part of trunk: Secondary | ICD-10-CM

## 2021-04-25 NOTE — Telephone Encounter
[facility] called stating that Pt is needing to have labs done for  CAP scan, and requested BUN & Creatnine orders to be faxed to [phone].

## 2021-05-03 ENCOUNTER — Inpatient Hospital Stay: Admit: 2021-05-03 | Discharge: 2021-05-03 | Payer: BLUE CROSS/BLUE SHIELD

## 2021-05-03 DIAGNOSIS — C4359 Malignant melanoma of other part of trunk: Secondary | ICD-10-CM

## 2021-05-03 DIAGNOSIS — N200 Calculus of kidney: Secondary | ICD-10-CM

## 2021-05-03 DIAGNOSIS — K8020 Calculus of gallbladder without cholecystitis without obstruction: Secondary | ICD-10-CM

## 2021-05-03 DIAGNOSIS — Z5112 Encounter for antineoplastic immunotherapy: Secondary | ICD-10-CM

## 2021-05-03 DIAGNOSIS — J984 Other disorders of lung: Secondary | ICD-10-CM

## 2021-05-03 DIAGNOSIS — C7931 Secondary malignant neoplasm of brain: Secondary | ICD-10-CM

## 2021-05-03 DIAGNOSIS — K6389 Other specified diseases of intestine: Secondary | ICD-10-CM

## 2021-05-03 LAB — CBC WITH AUTO DIFFERENTIAL
BKR WAM ABSOLUTE IMMATURE GRANULOCYTES.: 0.02 x 1000/??L (ref 0.00–0.30)
BKR WAM ABSOLUTE LYMPHOCYTE COUNT.: 2.43 x 1000/??L (ref 0.60–3.70)
BKR WAM ABSOLUTE NEUTROPHIL COUNT.: 5.58 x 1000/??L (ref 2.00–7.60)
BKR WAM BASOPHIL ABSOLUTE COUNT.: 0.07 x 1000/??L (ref 0.00–1.00)
BKR WAM BASOPHILS: 0.8 % — ABNORMAL LOW (ref 0.0–1.4)
BKR WAM EOSINOPHIL ABSOLUTE COUNT.: 0.3 x 1000/??L (ref 0.00–1.00)
BKR WAM EOSINOPHILS: 3.3 % (ref 0.0–5.0)
BKR WAM HEMATOCRIT (2 DEC): 42.7 % — ABNORMAL HIGH (ref 38.50–50.00)
BKR WAM HEMOGLOBIN: 14 g/dL (ref 5–15)
BKR WAM IMMATURE GRANULOCYTES: 0.2 % (ref 0.0–1.0)
BKR WAM LYMPHOCYTES: 26.6 % (ref 45–117)
BKR WAM MCH PG: 29.2 pg — ABNORMAL HIGH (ref 27.0–33.0)
BKR WAM MCHC: 32.8 g/dL (ref 31.0–36.0)
BKR WAM MCV: 89 fL — ABNORMAL HIGH (ref 80.0–100.0)
BKR WAM MONOCYTE ABSOLUTE COUNT.: 0.73 x 1000/??L (ref 0.00–1.00)
BKR WAM MONOCYTES: 8 % (ref 4.0–12.0)
BKR WAM MPV: 10.5 fL (ref 8.0–12.0)
BKR WAM NEUTROPHILS: 61.1 % (ref 39.0–72.0)
BKR WAM NUCLEATED RED BLOOD CELLS: 0 % (ref 0.0–1.0)
BKR WAM PLATELETS: 235 x1000/ÂµL (ref 150–420)
BKR WAM RDW-CV: 13.7 % (ref 11.0–15.0)
BKR WAM RED BLOOD CELL COUNT.: 4.8 M/ÂµL (ref 4.00–6.00)
BKR WAM WHITE BLOOD CELL COUNT: 9.1 x1000/ÂµL (ref 4.0–11.0)

## 2021-05-03 LAB — T4, FREE: BKR FREE T4: 0.79 ng/dL (ref 0.76–1.46)

## 2021-05-03 LAB — COMPREHENSIVE METABOLIC PANEL
BKR ALANINE AMINOTRANSFERASE (ALT): 37 U/L (ref 16–61)
BKR ALBUMIN: 3.3 g/dL — ABNORMAL LOW (ref 3.4–5.0)
BKR ALKALINE PHOSPHATASE: 89 U/L (ref 45–117)
BKR ANION GAP (LM): 7 mmol/L (ref 5–15)
BKR ASPARTATE AMINOTRANSFERASE (AST): 26 U/L (ref 15–37)
BKR BILIRUBIN TOTAL: 0.3 mg/dL (ref <1.0)
BKR BLOOD UREA NITROGEN: 29 mg/dL — ABNORMAL HIGH (ref 7–18)
BKR CALCIUM: 9.1 mg/dL (ref 8.5–10.1)
BKR CHLORIDE: 104 mmol/L (ref 98–107)
BKR CO2: 27 mmol/L (ref 21–32)
BKR CREATININE: 1.32 mg/dL — ABNORMAL HIGH (ref 0.70–1.30)
BKR EGFR, CREATININE (CKD-EPI 2021): 57 mL/min/{1.73_m2} — ABNORMAL LOW (ref >=60)
BKR GLOBULIN: 4.6 g/dL (ref 2.5–5.0)
BKR GLUCOSE: 270 mg/dL — ABNORMAL HIGH (ref 65–110)
BKR POTASSIUM: 4.5 mmol/L (ref 3.5–5.1)
BKR PROTEIN TOTAL: 7.9 g/dL (ref 6.4–8.2)
BKR SODIUM: 138 mmol/L (ref 136–145)

## 2021-05-03 LAB — LACTATE DEHYDROGENASE: BKR LACTATE DEHYDROGENASE: 151 U/L (ref 87–241)

## 2021-05-03 LAB — TSH W/REFLEX TO FT4     (BH GH LMW Q YH): BKR THYROID STIMULATING HORMONE (LM): 5.49 u[IU]/mL — ABNORMAL HIGH (ref 0.36–3.74)

## 2021-05-03 MED ORDER — SODIUM CHLORIDE 0.9 % BOLUS (NEW BAG)
0.9 % | Freq: Once | INTRAVENOUS | Status: CP
Start: 2021-05-03 — End: ?
  Administered 2021-05-03: 15:00:00 0.9 mL/h via INTRAVENOUS

## 2021-05-03 MED ORDER — SODIUM CHLORIDE 0.9 % (FLUSH) INJECTION SYRINGE
0.9 % | INTRAVENOUS | Status: DC | PRN
Start: 2021-05-03 — End: 2021-05-08

## 2021-05-03 MED ORDER — IOHEXOL 350 MG IODINE/ML INTRAVENOUS SOLUTION
350 mg iodine/mL | Freq: Once | INTRAVENOUS | Status: CP | PRN
Start: 2021-05-03 — End: ?
  Administered 2021-05-03: 15:00:00 350 mL via INTRAVENOUS

## 2021-05-06 ENCOUNTER — Ambulatory Visit: Admit: 2021-05-06 | Payer: BLUE CROSS/BLUE SHIELD

## 2021-05-06 ENCOUNTER — Inpatient Hospital Stay: Admit: 2021-05-06 | Discharge: 2021-05-06 | Payer: BLUE CROSS/BLUE SHIELD

## 2021-05-06 DIAGNOSIS — Z791 Long term (current) use of non-steroidal anti-inflammatories (NSAID): Secondary | ICD-10-CM

## 2021-05-06 DIAGNOSIS — Z8051 Family history of malignant neoplasm of kidney: Secondary | ICD-10-CM

## 2021-05-06 DIAGNOSIS — Z7982 Long term (current) use of aspirin: Secondary | ICD-10-CM

## 2021-05-06 DIAGNOSIS — Z8249 Family history of ischemic heart disease and other diseases of the circulatory system: Secondary | ICD-10-CM

## 2021-05-06 DIAGNOSIS — Z8052 Family history of malignant neoplasm of bladder: Secondary | ICD-10-CM

## 2021-05-06 DIAGNOSIS — Z7952 Long term (current) use of systemic steroids: Secondary | ICD-10-CM

## 2021-05-06 DIAGNOSIS — Z79890 Hormone replacement therapy (postmenopausal): Secondary | ICD-10-CM

## 2021-05-06 DIAGNOSIS — C7931 Secondary malignant neoplasm of brain: Secondary | ICD-10-CM

## 2021-05-06 DIAGNOSIS — Z5112 Encounter for antineoplastic immunotherapy: Secondary | ICD-10-CM

## 2021-05-06 DIAGNOSIS — C4359 Malignant melanoma of other part of trunk: Secondary | ICD-10-CM

## 2021-05-06 DIAGNOSIS — Z955 Presence of coronary angioplasty implant and graft: Secondary | ICD-10-CM

## 2021-05-06 DIAGNOSIS — Z7984 Long term (current) use of oral hypoglycemic drugs: Secondary | ICD-10-CM

## 2021-05-06 DIAGNOSIS — E1140 Type 2 diabetes mellitus with diabetic neuropathy, unspecified: Secondary | ICD-10-CM

## 2021-05-06 DIAGNOSIS — Z79899 Other long term (current) drug therapy: Secondary | ICD-10-CM

## 2021-05-06 DIAGNOSIS — E7800 Pure hypercholesterolemia, unspecified: Secondary | ICD-10-CM

## 2021-05-06 LAB — CBC WITH AUTO DIFFERENTIAL
BKR HEMOGLOBIN A1C: 10.1 fL — ABNORMAL HIGH (ref 4.0–5.6)
BKR WAM ABSOLUTE IMMATURE GRANULOCYTES.: 0.04 x 1000/??L (ref 0.00–0.30)
BKR WAM ABSOLUTE LYMPHOCYTE COUNT.: 2.18 x 1000/??L (ref 0.60–3.70)
BKR WAM ABSOLUTE NRBC (2 DEC): 0 x 1000/??L (ref 0.00–1.00)
BKR WAM ANALYZER ANC: 6.13 x 1000/??L — ABNORMAL HIGH (ref 2.00–7.60)
BKR WAM BASOPHIL ABSOLUTE COUNT.: 0.05 x 1000/??L (ref 0.00–1.00)
BKR WAM BASOPHILS: 0.5 % (ref 0.0–1.4)
BKR WAM EOSINOPHIL ABSOLUTE COUNT.: 0.3 x 1000/??L (ref 0.00–1.00)
BKR WAM EOSINOPHILS: 3.2 % (ref 0.0–5.0)
BKR WAM IMMATURE GRANULOCYTES: 0.4 % (ref 0.0–1.0)
BKR WAM LYMPHOCYTES: 23.2 % — ABNORMAL LOW (ref 17.0–50.0)
BKR WAM MCH (PG): 29.8 pg (ref 27.0–33.0)
BKR WAM MCHC: 33.7 g/dL — ABNORMAL HIGH (ref 31.0–36.0)
BKR WAM MCV: 88.6 fL (ref 80.0–100.0)
BKR WAM MONOCYTE ABSOLUTE COUNT.: 0.7 x 1000/??L (ref 0.00–1.00)
BKR WAM MONOCYTES: 7.4 % (ref 4.0–12.0)
BKR WAM MPV: 10.1 fL (ref 8.0–12.0)
BKR WAM NEUTROPHILS: 65.3 % (ref 39.0–72.0)
BKR WAM NUCLEATED RED BLOOD CELLS: 0 % (ref 0.0–1.0)
BKR WAM PLATELETS: 227 x1000/??L — ABNORMAL HIGH (ref 150–420)
BKR WAM RDW-CV: 13.4 % — ABNORMAL HIGH (ref 11.0–15.0)
BKR WAM RED BLOOD CELL COUNT.: 4.46 M/??L (ref 4.00–6.00)
BKR WAM WHITE BLOOD CELL COUNT: 9.4 x1000/??L (ref 4.0–11.0)

## 2021-05-06 LAB — COMPREHENSIVE METABOLIC PANEL
BKR A/G RATIO: 1.1 (ref 1.0–2.2)
BKR ALANINE AMINOTRANSFERASE (ALT): 26 U/L (ref 9–59)
BKR ALBUMIN: 3.9 g/dL (ref 3.6–4.9)
BKR ALKALINE PHOSPHATASE: 80 U/L (ref 9–122)
BKR ANION GAP: 9 (ref 7–17)
BKR ASPARTATE AMINOTRANSFERASE (AST): 23 U/L (ref 10–35)
BKR AST/ALT RATIO: 0.9
BKR BILIRUBIN TOTAL: 0.3 mg/dL (ref 0.0–<=1.2)
BKR BLOOD UREA NITROGEN: 31 mg/dL — ABNORMAL HIGH (ref 8–23)
BKR BUN / CREAT RATIO: 23.7 — ABNORMAL HIGH (ref 8.0–23.0)
BKR CALCIUM: 9.3 mg/dL (ref 8.8–10.2)
BKR CHLORIDE: 98 mmol/L (ref 98–107)
BKR CO2: 29 mmol/L (ref 20–30)
BKR CREATININE: 1.31 mg/dL — ABNORMAL HIGH (ref 0.40–1.30)
BKR EGFR, CREATININE (CKD-EPI 2021): 57 mL/min/{1.73_m2} — ABNORMAL LOW (ref >=60)
BKR ESTIMATED AVERAGE GLUCOSE: 23.7 mg/dL — ABNORMAL HIGH (ref 8.0–23.0)
BKR GLOBULIN: 3.6 g/dL — ABNORMAL HIGH (ref 2.3–3.5)
BKR GLUCOSE: 244 mg/dL — ABNORMAL HIGH (ref 70–100)
BKR POTASSIUM: 5.1 mmol/L (ref 3.3–5.3)
BKR PROTEIN TOTAL: 7.5 g/dL (ref 6.6–8.7)
BKR SODIUM: 136 mmol/L (ref 136–144)
BKR WAM HEMATOCRIT (2 DEC): 98 mmol/L (ref 98–107)
BKR WAM HEMOGLOBIN: 5.1 mmol/L (ref 3.3–5.3)

## 2021-05-06 LAB — TSH W/REFLEX TO FT4     (BH GH LMW Q YH): BKR THYROID STIMULATING HORMONE: 5.3 ??IU/mL — ABNORMAL HIGH

## 2021-05-06 LAB — LACTATE DEHYDROGENASE: BKR LACTATE DEHYDROGENASE: 169 U/L (ref 122–241)

## 2021-05-06 LAB — T4, FREE: BKR FREE T4: 0.82 ng/dL

## 2021-05-06 MED ORDER — SODIUM CHLORIDE 0.9 % BOLUS (NEW BAG)
0.9 % | Freq: Once | INTRAVENOUS | Status: CP
Start: 2021-05-06 — End: ?
  Administered 2021-05-06: 17:00:00 0.9 mL/h via INTRAVENOUS

## 2021-05-06 MED ORDER — NIVOLUMAB INFUSION
Freq: Once | INTRAVENOUS | Status: CP
Start: 2021-05-06 — End: ?
  Administered 2021-05-06: 17:00:00 100.000 mL/h via INTRAVENOUS

## 2021-05-06 NOTE — Progress Notes
Patient?arrived?today for Opdivo infusion.Patient states he slipped in bathroom yesterday and scrapped his back, states he did not hit his head. Has neuropathy and uses cane on and off. Md aware.CBC CMP reviewed, ok to treat. Cr elevated 1.31, BUN 31, NS 500 ml ordered for today.doing ok with his eye fogginess, seeing eye MD.Had 3-4 loose bowel movements yesterday.. Taking imodium and lomotil as instructed.?Patient verbalizes understanding when and how to take it.LAF 24g IV with + bld return, Opdivo infused over 30 min.Rash around umbilicus. Patient states has cream but is not consistently putting it on. Encouraged to do so. Also seeing Dermatologist. Rash improving.Patient instructed to call MD for worsening diarrhea not relieved by his medication or worsening of the rash, or other side effects he might experiences.Patient verbalizes understanding.RTC on  for labs and treatment??

## 2021-05-06 NOTE — Patient Instructions
Please call office if QMV:HQIONGE a temperature of 100.4 or higher with or without shaking chillsdevelop a cough with yellow or green sputumhave urinary frequency, urgency or other infectious processeshave nausea or vomiting that is not well controlled with home medicationshave constipation or diarrhea that is not well controlled with home medicationsnotice unusual bleeding or bruises, blood in sputum, urine or stool have any falls where you hit your headExperience poor oral intake or signs or symptoms os dehydration (dizziness upon standing, very dry mucous membranes or dark concentrated urine)Remember to:Wash hands and utilize good hand hygiene before and after using the bathroom and before eatingAvoid sick family members and friendsConsume small frequent meals high in protein and caloriesDrink 6-8 glasses of non-caffeine, non-alcoholic beverages daily for adequate hydration

## 2021-05-09 NOTE — Progress Notes
NAME:  Jason Rowland (07-08-1945)AGE: 76 y.o. MRN:  [id number] PROVIDER: Drema Halon, MDDATE OF SERVICE: 05/06/2021 [place] [facility]ANOMA CLINICFOLLOW-UP VISIT   REASON FOR VISIT:  Ongoing treatment ONC DX:  Invasive melanoma right mid backSTAGE:  Likely IV (pT4a N3 M1d(0) - originally stage IIIC with 30 mm with microsatellitosis, mitotic rate 2 per mm2).  Noted 09/18/20 to have new brain lesions (too small for biopsy or GKRS).MOLECULAR PROFILING:0% tumor and immune cell PD-L1 staining50-gene panel:  DNA VARIANT DETECTED ? ? 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 ? 74% PRIOR TX:  - WLE and SLNB 03/03/19- Adjuvant Moderna clinical trial (?A Phase 2 Randomized Study of Adjuvant Immunotherapy with the Personalized Cancer Vaccine [id number] and Pembrolizumab Versus Pembrolizumab Alone After Complete Resection of High-Risk Melanoma? Protocol [id number], HIC 47829) C1D1 05/08/19. Last dose 04/27/20. Randomized to the pembrolizumab and vaccine arm.- 07/22/20 resection of recurrent LN in the right upper back (1.6 cm LN with 1.1 cm tumor deposit)ACTIVE TX:   ipilimumab (3 mg/kg) + nivolumab (1 mg/kg) - (10/08/20 - )Onc Hx: Jason Rowland (prefers to be called Jason Rowland) is a 76 y.o. male who worked for [employer] (retired at the end of 02/2019 after working there for 50 years) with a PMH of DM2 complicated by peripheral neuropathy, HLD, and AKs who is referred by Joanie Coddington, his dermatologist for a right back metastatic melanoma.  He reports it appeared approx 10 months prior; his PCP originally thought it was a pimple.  Dr. Lorn Junes at [facility] later evaluated it and also thought it was a pimple and injected it with intralesional kenalog x 2 and oral cephalexin without improvement.  The lesion increased in size and burned whenever he leaned against anything.  Denies night sweats and weight loss.  Lucie Leather did a I&D on 01/07/19  with only slight bloody discharge, so a punch biopsy was obtain with path showing metastatic melanoma.  On 01/20/19, additional biopsies were done to try to evaluate a primary site, but we do not have this tissue.  Per patient report, these biopsies were negative. MRI brain 01/30/19 without intracranial disease.  PET done 02/07/19 showing preepiglottic FDG activity s/p ENT evaluation which was negative for any visible malignancy.  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 LDH elevated to 251 at diagnosis. His case was presented at the [facility] Melanoma tumor board on 02/13/19 and consensus was to ask ENT for random biopsies of the BOT and pre-epiglottic PET-avid areas, as we do not want to miss a head and neck cancer but otherwise not felt to be related to his melanoma.  The cervical LN were not thought to be related.  He should also have gastroenterology evaluation for a PET-avid stranding lesion in the transverse colon.  Dr. Landis Gandy (ENT) performed BOT and vallecula random biopsies 02/20/19 which only showed chronic inflammation and no malignancy.  He had a colonoscopy by Dr. Linton Flemings on 02/27/19 showing only a tubular adenoma in the sigmoid colon.  Overall, it was felt that his right mid back lesion is the site of his primary disease, and the metastatic melanoma pathology interpretation may have been complicated due to repeat I&Ds and steroid injections that may have altered the architecture of the primary lesion.  In the absence of any confirmed metastatic disease, he underwent WLE and LND with Dr. Duanne Moron on 03/03/19.  Final path showed a 30 mm thick melanoma, 2 mitoses/mm2, non-brisk TILs, no tumor regression, LVI+, microsatellites+, negative margins, and 3/35 lymph nodes (largest LN 4.5 x 3  cm) were positive for melanoma with presence of lymphatic invasion without extranodal extension.He uses a cane sometimes as he has some balance issues due to neuropathy in his feet.  Otherwise, he is fully functional, active, and independent in ADLs/iADLs.  No hx of autoimmune disease.03/27/19 signed consent for the Moderna trial; randomized to pembro and vaccine arm.  Started treatment C1D1 05/08/19.04/24/19 noted to have wound dehiscence, non-infected, healing by secondary intent.  Re-instated VNA services to assist with dressing changes and packing.  Wound completely healed by 06/20/19.10/27/19, 01/12/20, and 04/24/20 surveillance scans NED.07/05/20 - [place] chest showing an increased 1.0 cm subcutaneous nodule in the right upper back.  Bx 07/06/20 not diagnostic.  Excision by Dr. Duanne Moron 07/22/20 showed metastatic melanoma involving 1 LN with 11 mm tumor deposit in a 1.6 cm LN.   09/18/20 - MRI brain showing a 8 mm left hippocampus lesion and a 6 mm lesion in the left mesial temporal lobe.  Reports new subtle memory issues.10/08/20 - C1 ipilimumab (3 mg/kg) + nivolumab (1 mg/kg)Interval UU:VOZD returns for follow-up & nivo treatment today.  He skipped last month's treatment as he was away in [facility].  He had [place] CAP done 05/03/21, but MRI brain was not done at the same time.  He reports his head couldn't fit in the machine.  He is now scheduled for 2/2 in [place].Had some diarrhea at 4 AM last night but thinks it might be related to his dinner last night.  He had 3 episodes in the last 24 hours.  Denies any cramping.  Otherwise he had not had any diarrhea since his last visit.Imaging also shows subtle foci of groundglass density are present throughout the lungs.  This is suspicious for early metastatic disease. Short interval chest [place] follow-up can be considered.  Denies cough or shortness of breath.He reports vision is clear today has occasional dry eyes and uses visine Had an optho appt on 10/12 seen by another provider and was told he had  - macular degeneration. No other  concerns. No intervention or prescriptions ordered. His next appt is in 04/2021.  Fatigue is stable. He often is up to midnight watching TV and wakes early; so he takes naps if he is tired and is now taking an hour nap daily.ECOG:  1Pain: 0/10Review of Systems:No fevers, chills, night sweats, lightheadedness, HA, CP, palpitations, wheezing, abd pain, constipation, blood in the urine or stools, dysuria.  Diabetic neuropathy in the hands and feet stable.   Positive responses on ROS as noted in interval hx. PAST MEDICAL HISTORY:Past Medical History: Diagnosis Date ? Diabetes mellitus (HC Code)  ? High cholesterol  ? Kidney stones   40 years ago ? 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 Malignant melanoma of torso excluding breast (HC Code)  SURGICAL HISTORY:Past Surgical History: Procedure Laterality Date ? COLONOSCOPY   ? CORONARY ANGIOPLASTY WITH STENT PLACEMENT Bilateral   patient denies ? laryngoscopy with biopsy  02/20/2019 ? ROTATOR CUFF REPAIR Left 1999 ? TONSILLECTOMY    age 31 ? TOTAL HIP ARTHROPLASTY Right 2002 FAMILY HISTORY:Family History Problem Relation Age of Onset ? Heart disease Mother  ? Bladder cancer Father 26 ? Kidney cancer Sister  ? Leukemia Brother 27 ? No Known Problems Daughter  ? No Known Problems Son  SOCIAL HISTORY:Social History Socioeconomic History ? Marital status: Divorced   Spouse name: Not on file ? Number of children: Not on file ?  Years of education: Not on file ? Highest education level: Not on file Occupational History ? Not on file Tobacco Use ? Smoking status: Never ? Smokeless tobacco: Never Vaping Use ? Vaping Use: Never used Substance and Sexual Activity ? Alcohol use: Not Currently ? Drug use: No ? Sexual activity: Not on file Other Topics Concern ? Not on file Social History Narrative  Divorced, works for [employer] in [place] will be retiring 03/17/19.  Has 1 son in [place] and 1 daughter in [place]. Llives in [place]  Social Determinants of Health Financial Resource Strain: Not on file Food Insecurity: Not on file Transportation Needs: Not on file Physical Activity: Not on file Stress: Not on file Social Connections: Not on file Intimate Partner Violence: Not on file Housing Stability: Not on file ALLERGIES:No Known AllergiesMEDICATIONS:Current Outpatient Medications Medication Sig ? acetaminophen (TYLENOL) 325 mg tablet Take 3 tablets (975 mg total) by mouth every 6 (six) hours as needed (mild to moderate pain). ? ALPRAZolam (XANAX) 0.5 mg tablet Take 1 tablet (0.5 mg total) by mouth as needed. ? aspirin 81 MG EC tablet Take 1 tablet (81 mg total) by mouth nightly. ? diphenoxylate-atropine (LOMOTIL) 2.5-0.025 mg per tablet Take 1 tablet by mouth 4 (four) times daily as needed for diarrhea. ? FREESTYLE LIBRE 14 DAY sensor kit 1 each by Other route every 14 (fourteen) days. ? glipiZIDE (GLUCOTROL XL) 10 MG 24 hr tablet Take 1 tablet (10 mg total) by mouth nightly. ? ibuprofen (ADVIL,MOTRIN) 200 mg tablet Take 2 tablets (400 mg total) by mouth every 6 (six) hours as needed (alternate with tylenol for mild to moderate pain). ? indomethacin (INDOCIN) 25 mg capsule 3 (three) times daily. ? levothyroxine (SYNTHROID, LEVOTHROID) 50 MCG tablet Take 1 tablet (50 mcg total) by mouth daily. ? loperamide (IMODIUM A-D) 2 mg tablet Take 1 tablet (2 mg total) by mouth See Admin Instructions. 4mg  ( 2 tablets ) in the morning. And then with each bm take an additional one table . Max dose per day 16mg  ? olopatadine (PATANOL) 0.1 % ophthalmic solution as needed (takes for eye irritation as needed). ? ondansetron (ZOFRAN-ODT) 8 mg disintegrating tablet Place 1 tablet (8 mg total) onto the tongue every 8 (eight) hours as needed for nausea. ? predniSONE (DELTASONE) 20 mg tablet Take 3 tablets (60 mg total) by mouth daily. Take with food. Please speak to office before starting this medication ? simvastatin (ZOCOR) 20 MG tablet Take 1 tablet (20 mg total) by mouth nightly. ? TOUJEO SOLOSTAR U-300 300 unit/mL (1.5 mL) pen Inject 70 Units under the skin nightly. ? triamcinolone (KENALOG) 0.1 % ointment Apply topically 2 (two) times daily as needed. To areas of rash. ? vit A/vit C/vit E/zinc/copper (PRESERVISION AREDS ORAL) Take by mouth 2 (two) times daily. ? zolpidem (AMBIEN) 10 mg tablet nightly.. No current facility-administered medications for this visit. VITALS: BP 122/80  - Pulse 86  - Temp 98.2 ?F (36.8 ?C) (Temporal)  - Resp 18  - Wt 117 kg  - SpO2 99%  - BMI 36.48 kg/m?  PHYSICAL EXAM:Gen:  NAD, pleasant HEENT:  EOMI, PEARLLN:  No cervical, supraclavicular, axillary, or inguinal LAD. CV:  No murmurs, mild tachy, no m/r/g Pulm:  CTA b/lAbd:  Soft, no tenderness, no guarding or rebound, no organomegaly, no ascitesExt:  WWP, 1+ symmetric edema in the ankles.  Lymphedema present in the right arm, unchanged.Neuro:   No focal deficitsPsych:  Normal mood and affectSkin:  Scar on the right upper back  well healed without evidence of recurrence. Small lateral skin puckering at the edges of the scar stable.  Flaking skin/dryness in the middle portion of the scar.  Right axillary scar well healed.  Scar from LN recurrence well healed. Eczema to umbilical area mildly improved. LABS:Results for orders placed or performed in visit on 05/06/21 TSH w/reflex to FT4     (BH GH LMW Q YH) Result Value Ref Range  Thyroid Stimulating Hormone 5.300 (H) See Comment ?IU/mL CBC auto differential Result Value Ref Range  WBC 9.4 4.0 - 11.0 x1000/?L  RBC 4.46 4.00 - 6.00 M/?L  Hemoglobin 13.3 13.2 - 17.1 g/dL  Hematocrit 82.95 62.13 - 50.00 %  MCV 88.6 80.0 - 100.0 fL  MCH 29.8 27.0 - 33.0 pg  MCHC 33.7 31.0 - 36.0 g/dL  RDW-CV 08.6 57.8 - 46.9 %  Platelets 227 150 - 420 x1000/?L  MPV 10.1 8.0 - 12.0 fL  Neutrophils 65.3 39.0 - 72.0 %  Lymphocytes 23.2 17.0 - 50.0 %  Monocytes 7.4 4.0 - 12.0 %  Eosinophils 3.2 0.0 - 5.0 %  Basophil 0.5 0.0 - 1.4 %  Immature Granulocytes 0.4 0.0 - 1.0 %  nRBC 0.0 0.0 - 1.0 %  ANC(Abs Neutrophil Count) 6.13 2.00 - 7.60 x 1000/?L  Absolute Lymphocyte Count 2.18 0.60 - 3.70 x 1000/?L  Monocyte Absolute Count 0.70 0.00 - 1.00 x 1000/?L  Eosinophil Absolute Count 0.30 0.00 - 1.00 x 1000/?L  Basophil Absolute Count 0.05 0.00 - 1.00 x 1000/?L  Absolute Immature Granulocyte Count 0.04 0.00 - 0.30 x 1000/?L  Absolute nRBC 0.00 0.00 - 1.00 x 1000/?L Comprehensive metabolic panel Result Value Ref Range  Sodium 136 136 - 144 mmol/L  Potassium 5.1 3.3 - 5.3 mmol/L  Chloride 98 98 - 107 mmol/L  CO2 29 20 - 30 mmol/L  Anion Gap 9 7 - 17  Glucose 244 (H) 70 - 100 mg/dL  BUN 31 (H) 8 - 23 mg/dL  Creatinine 6.29 (H) 5.28 - 1.30 mg/dL  Calcium 9.3 8.8 - 41.3 mg/dL  BUN/Creatinine Ratio 24.4 (H) 8.0 - 23.0  Total Protein 7.5 6.6 - 8.7 g/dL  Albumin 3.9 3.6 - 4.9 g/dL  Total Bilirubin 0.3 <=0.1 mg/dL  Alkaline Phosphatase 80 9 - 122 U/L  Alanine Aminotransferase (ALT) 26 9 - 59 U/L  Aspartate Aminotransferase (AST) 23 10 - 35 U/L  Globulin 3.6 (H) 2.3 - 3.5 g/dL  A/G Ratio 1.1 1.0 - 2.2  AST/ALT Ratio 0.9 See Comment  eGFR (Creatinine) 57 (L) >=60 mL/min/1.66m2 Lactate dehydrogenase Result Value Ref Range  LD 169 122 - 241 U/L T4, free Result Value Ref Range  Free T4 0.82 See Comment ng/dL IMAGING/PATH:[facility] Chest Abdomen Pelvis w IV ContrastResult Date: 1/17/2023CT CHEST ABDOMEN PELVIS W IV CONTRAST  HISTORY: Melanoma, assess treatment response 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 COMPARISON: 01/16/2021 U2725 - Total number of known [place] scans and cardiac nuclear medicine studies within the past 12 months: 6 D6644 - RADIATION DOSE ACQUIRED DURING SCAN: 1193.29 mGy.cm. The [employer] strives for high quality imaging with the lowest possible radiation dose. For more information on medical radiation exposure please visit: [email or web address] . TECHNIQUE: Contiguous axial images were obtained through the chest, abdomen, and pelvis. Intravenous contrast was administered (75 cc Omnipaque 350).  Oral contrast was not  administered. Sagittal and coronal reformatted images were provided along  with axial, sagittal, and coronal MIP images. FINDINGS: The patient demonstrates a normal variant of an azygos lobe in the right upper lobe. There are fairly dense coronary artery calcifications. There is no mediastinal adenopathy. There are surgical clips in the right axilla. No axillary adenopathy is evident. There are several scattered small nodular foci of groundglass density within the lungs which are not evident previously. Some examples can be seen on series 3 image 136, 155, 159, 200. There is a stable 4 mm nodule within the superior segment left lower lobe on image 143. There is no definite inter lobular septal thickening. There is no pleural effusion. The liver appears homogenous. There is layering sludge or small stones within the gallbladder. The spleen, pancreas and adrenal glands appear normal. There are nonobstructing small calculi within the right kidney. Aorta is normal in caliber. The bladder is unremarkable. A right hip prosthesis is present resulting in artifact within the pelvis. There is no pelvic, para-aortic or mesenteric adenopathy. There is sigmoid diverticulosis. The sigmoid wall appears mildly diffusely thickened. There is subtle fat stranding at the junction of the descending colon and sigmoid colon possibly representing diverticulitis. A 4 cm fat-containing umbilical hernia is present. No aggressive skeletal lesion is demonstrated.  1. Subtle foci of groundglass density are present throughout the lungs. These represent new findings. This is suspicious for early metastatic disease. Short interval chest [place] follow-up can be considered. 2. Sigmoid diverticulosis. Mild diverticulitis may be present. Clinical correlation is recommended. There is wall thickening of the sigmoid colon which may be inflammatory. 3. Cholelithiasis. Nonobstructing right renal calculi. Reported and Signed by:  Vanice Sarah, MD  07/22/20: SOFT TISSUE, RIGHT UPPER BACK, EXCISION: ? ? ? ?- METASTATIC MELANOMA INVOLVING A LYMPH NODE (1/1) ? ? ? ? ? - SIZE OF THE LARGEST TUMOR DEPOSIT: 11 MM ? ? ? ? ? - SIZE OF THE LYMPH NODE: 1.6 CM WLE and SLN 03/03/19:LYMPH NODES, RIGHT AXILLARY LEVELS 1, 2 AND 3, LYMPH NODE DISSECTION: ?  ? ? - ?THREE OF THIRTY-FIVE LYMPH NODES, POSITIVE FOR MELANOMA (3 OF 35) WITH LARGEST INVOLVED LYMPH NODE MEASURING 4.5 X 3 CM ? ? ?- LYMPHATIC INVASION IDENTIFIED ? ? ?- NO EXTRANODAL EXTENSION IDENTIFIED 1. ?SKIN, BACK, RIGHT UPPER, EXCISION: ? ? ? ?- MALIGNANT MELANOMA, SEE NOTE AND SYNOPTIC SUMMARY Note: Sections show a tumor composed of S100 positive epithelioid cells within the dermis and subcutaneous tissue. Cytokeratin AE1/AE3 and desmin are negative. A junctional component is not identified. The findings would be compatible with a primary dermal melanoma or a metastatic lesion. Clinical correlation is suggested. 2. ?SKIN, BACK, EXCISION: ? ? ? ?- BENIGN FIBROADIPOSE TISSUE AND SKELETAL MUSCLE SYNOPTIC SUMMARY MELANOMA OF THE SKIN Tumor Site: ? ? Back Laterality: ? ? Right Procedure: ? ? Primary excision Maximum Tumor Thickness (Depth): ? ? 30 mm Ulceration: ? ? Not identified Mitotic Rate (Mitoses/mm2): ? ? 2 Anatomic Level: ? ? V (Melanoma invades subcutis) Growth Phase: ? ? Vertical Histologic Type: ? ? Melanoma, NOS Tumor-Infiltrating Lymphocytes: ? ? Present, non-brisk Tumor Regression: ? ? Not identifed Lymphovascular Invasion: ? ? Present Microsatellitosis: ? ? Present Margins ? ? ?Peripheral Margins: ? ? Uninvolved Deep Margin: ? ? Uninvolved Stage (AJCC 8th Ed): ? ? pT4a Nx Base of the tongue and vallecula random biopsies 02/20/19: 1. ?TONGUE, LEFT, BASE, BIOPSY: ? ? ? ?- LYMPHOID TISSUE HYPERPLASIA ? ? ?- NEGATIVE FOR MALIGNANCY 2. ?THROAT, VALLECULA, BIOPSY: ? ?  ? - LYMPHOID TISSUE HYPERPLASIA AND FOCAL  CHRONIC INFLAMMATION. SEE NOTE. ? ? ?- NEGATIVE FOR MALIGNANCY ? ? ? ? ? Note: Immunostain of Sox-10 to investigate focal pigment is negative, supporting above interpretation. 3. ?THROAT, LEFT VALLECULA, BIOPSY: ? ? ? ?- LYMPHOID TISSUE HYPERPLASIA ? ? ?- NEGATIVE FOR MALIGNANCY 4. ?THROAT, RIGHT VALLECULA, BIOPSY: ? ? ? ? ? ? - LYMPHOID TISSUE HYPERPLASIA AND FOCAL CHRONIC INFLAMMATION ? ? ?- NEGATIVE FOR MALIGNANCY 5. ?TONGUE, RIGHT BASE, BIOPSY: ? ? ? ? ? ? - LYMPHOID TISSUE HYPERPLASIA AND FOCAL CHRONIC INFLAMMATION ? ? ?- NEGATIVE FOR MALIGNANCY Pathology (reviewed at [facility]), collected 01/07/19:DIAGNOSIS: ? 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 Clinical pathological correlation is recommended. MICROSCOPIC DESCRIPTION: There are atypical cells in a nodule in the dermis. The cells are positive with SOX-10 and by report are negative with LCA, Kappa, Lambda, and a cytokeratin. The cells are also positive with S-100 by report.ASSESSMENT/PLAN:[name] is a 76 y.o. 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 Duanne Moron on 03/03/19 showing a stage IIIC melanoma that was 30 mm thick, positive for LVI and microsatellites with 3/25 positive LNs.  Given the thickness of his primary melanoma and microsatellitosis, we felt that his melanoma-specific survival is closer to a stage IIID:  60% over 10 years with stage IIIC disease versus 24% with stage IIID.  He opted to proceed with the Moderna clinical trial (?A Phase 2 Randomized Study of Adjuvant Immunotherapy with the Personalized Cancer Vaccine [phone] and Pembrolizumab Versus Pembrolizumab Alone After Complete Resection of High-Risk Melanoma?) and completed 1 year of adjuvant therapy on trial without issues.  He developed an enlarged 1 cm SQ nodule overlying the right scapula noted on scans 07/05/20 s/p excision showing a 1.6 cm LN containing a 11 mm deposit of melanoma.MRI brain 09/18/20 showed a 8 mm left hippocampus lesion and a 6 mm lesion in the left mesial temporal lobe concerning for brain mets.  Reviewed the case with Dr. Winfred Leeds; lesions are too small and ill-defined to biopsy and would hold off on GKRS for now.  Given high suspicion for metastatic disease, recurrence of his disease recently, and fairly good performance status, would opt to be aggressive.  I reviewed with Jason Rowland that ideally, we would want to get a biopsy to establish stage IV disease, but given that we know his disease is aggressive and presented again shortly after discontinuation of adjuvant anti-PD-1, we can presume that this is metastatic disease and start a new line of systemic therapy.  Discussed the available brain met clinical trials, but he wants to move to [facility] and would like to pursue standard of care. Discussed data from Check[employer]ate 204 utilizing ipi 3 + nivo 1 in brain metastasis which demonstrated similar intracranial and extracranial efficacy.  Jason Rowland agreed to proceed; thus far tx has been complicated by G1-2 diarrhea which is well controlled with imodium/lomotil. [place] CAP 05/03/21 shows multiple non-specific areas of diffuse enhancement in the b/l lungs which requires f/u.- labs reviewed, BUN 331, will follow, encouraged to increase oral water intake, glucose 244. Hgb 13.3, TSH 5.3; will monitor- Ok to proceed with Nivo cycle 7 and will give 500 cc of NS today. No prior hx of cardiac concerns - Diarrhea:  To monitor for now as seems to be a food-related isolated event limited to  the last 24 hours- Ongoing intermittent blurry vision f/u with his eye doctor on 04/2021; was told he had macular degeneration. No evidence of uveitis.- f/u MRI brain now rescheduled for 05/19/21- [place] cap next due in April- routine f/u with dermatologist for FBSEs: every 4 months. Next appt is sometime in January - RTC in 4 weeks for nivo monotherapy.30 min spent in chart review and direct care on the day of the encounter.Electronically Signed by Drema Halon, MD, May 06, 2021

## 2021-05-19 ENCOUNTER — Inpatient Hospital Stay: Admit: 2021-05-19 | Discharge: 2021-05-19 | Payer: BLUE CROSS/BLUE SHIELD

## 2021-05-19 DIAGNOSIS — Z79899 Other long term (current) drug therapy: Secondary | ICD-10-CM

## 2021-05-19 DIAGNOSIS — C7931 Secondary malignant neoplasm of brain: Secondary | ICD-10-CM

## 2021-05-19 DIAGNOSIS — C4359 Malignant melanoma of other part of trunk: Secondary | ICD-10-CM

## 2021-05-19 MED ORDER — GADOTERATE MEGLUMINE 0.5 MMOL/ML (376.9 MG/ML) INTRAVENOUS SOLUTION
0.5 mmol/mL (376.9 mg/mL) | Freq: Once | INTRAVENOUS | Status: CP | PRN
Start: 2021-05-19 — End: ?
  Administered 2021-05-19: 16:00:00 0.5 mL via INTRAVENOUS

## 2021-05-19 MED ORDER — SODIUM CHLORIDE 0.9 % BOLUS (NEW BAG)
0.9 % | Freq: Once | INTRAVENOUS | Status: CP | PRN
Start: 2021-05-19 — End: ?
  Administered 2021-05-19: 16:00:00 0.9 mL/h via INTRAVENOUS

## 2021-05-20 ENCOUNTER — Encounter: Admit: 2021-05-20 | Payer: PRIVATE HEALTH INSURANCE

## 2021-05-20 ENCOUNTER — Telehealth: Admit: 2021-05-20 | Payer: PRIVATE HEALTH INSURANCE

## 2021-05-20 DIAGNOSIS — G939 Disorder of brain, unspecified: Secondary | ICD-10-CM

## 2021-05-20 DIAGNOSIS — R9089 Other abnormal findings on diagnostic imaging of central nervous system: Secondary | ICD-10-CM

## 2021-05-20 NOTE — Telephone Encounter
Called and reviewed mri results of the brain with Jason Rowland. He is not having any active or concerning sxs.Denies arm or leg numbness. No trouble speaking or walking.He was out all day running errands and was making dinner when I spoke with himAn urgent referral was placed to the neuro team. Dr [name] and I will f/u next week to make sure appt was booked.[name] knows to seek emergency care if he develops and concerning sxs. FINDINGS: ?An ovoid 0.9 x 1.6 cm focus of hyperintense signal is seen in the left corona radiata/posterior frontal white matter on the diffusion-weighted sequence with corresponding slightly hypointense signal on the ADC map. There is mild hyperintensity on FLAIR and T2-weighted sequences along with enhancement after intravenous contrast administration. The left temporal lobe lesions seen on the June study now only show a tiny focus of enhancement adjacent to the temporal horn such as on series 12 image 63. Moderate atrophy is present. Signal throughout the cerebral white matter presumably chronic ischemic. There is no hydrocephalus or midline shift.?No paranasal sinus opacification is noted. No tympanomastoid opacification is evident. 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 Continued follow-up will allow for assessment of full evolution.?

## 2021-05-24 ENCOUNTER — Encounter: Admit: 2021-05-24 | Payer: PRIVATE HEALTH INSURANCE

## 2021-05-24 ENCOUNTER — Inpatient Hospital Stay: Admit: 2021-05-24 | Discharge: 2021-05-24 | Payer: BLUE CROSS/BLUE SHIELD

## 2021-05-24 NOTE — Telephone Encounter
Pot called back requesting to speak with Adriana.  Please call Pt at [phone]:05 PMCalled andy back. Working with Dr Orland Dec team to find appropriate provider. Will call andy tomorrow with appt time. Her verbalized understanding and endorsed no new concerns Electronically Signed by Bo Mcclintock, APRN, May 24, 2021

## 2021-05-25 ENCOUNTER — Telehealth: Admit: 2021-05-25 | Payer: PRIVATE HEALTH INSURANCE

## 2021-05-25 NOTE — Telephone Encounter
Called and spoke to [facility]. [facility] in processNeuro stroke appt made for 3/30/23Will call [facility] on Friday. He was hoping to start the process of moving to [facility] in April Electronically Signed by Bo Mcclintock, APRN, May 25, 2021

## 2021-05-30 ENCOUNTER — Telehealth: Admit: 2021-05-30 | Payer: PRIVATE HEALTH INSURANCE

## 2021-05-30 NOTE — Telephone Encounter
Called neuro call center. [name] is a Chiropodist for [employer].Trying to an earlier appt , andy is aware Electronically Signed by Bo Mcclintock, APRN, May 30, 2021

## 2021-06-01 ENCOUNTER — Telehealth: Admit: 2021-06-01 | Payer: PRIVATE HEALTH INSURANCE

## 2021-06-01 NOTE — Telephone Encounter
Message given directly to Dr Laveda Norman.  Electronically Signed by Alda Ponder, RN, June 01, 2021

## 2021-06-01 NOTE — Telephone Encounter
Dr. Tylene Fantasia at [facility] requesting a call back from Dr. Laveda Norman to discuss Mr. Panas. Please reach out to him at [phone]. He asks Dr. Laveda Norman call back either today 2/15 or Friday 2/17 between 1:30p-6p.

## 2021-06-10 ENCOUNTER — Inpatient Hospital Stay: Admit: 2021-06-10 | Discharge: 2021-06-10 | Payer: BLUE CROSS/BLUE SHIELD

## 2021-06-10 ENCOUNTER — Ambulatory Visit: Admit: 2021-06-10 | Payer: BLUE CROSS/BLUE SHIELD

## 2021-06-10 ENCOUNTER — Encounter: Admit: 2021-06-10 | Payer: PRIVATE HEALTH INSURANCE

## 2021-06-10 DIAGNOSIS — Z79620 Long term (current) use of immunosuppressive biologic: Secondary | ICD-10-CM

## 2021-06-10 DIAGNOSIS — R197 Diarrhea, unspecified: Secondary | ICD-10-CM

## 2021-06-10 DIAGNOSIS — E119 Type 2 diabetes mellitus without complications: Secondary | ICD-10-CM

## 2021-06-10 DIAGNOSIS — Z79890 Hormone replacement therapy (postmenopausal): Secondary | ICD-10-CM

## 2021-06-10 DIAGNOSIS — E7800 Pure hypercholesterolemia, unspecified: Secondary | ICD-10-CM

## 2021-06-10 DIAGNOSIS — N200 Calculus of kidney: Secondary | ICD-10-CM

## 2021-06-10 DIAGNOSIS — Z955 Presence of coronary angioplasty implant and graft: Secondary | ICD-10-CM

## 2021-06-10 DIAGNOSIS — Z7982 Long term (current) use of aspirin: Secondary | ICD-10-CM

## 2021-06-10 DIAGNOSIS — Z8052 Family history of malignant neoplasm of bladder: Secondary | ICD-10-CM

## 2021-06-10 DIAGNOSIS — C4359 Malignant melanoma of other part of trunk: Secondary | ICD-10-CM

## 2021-06-10 DIAGNOSIS — Z7952 Long term (current) use of systemic steroids: Secondary | ICD-10-CM

## 2021-06-10 DIAGNOSIS — Z5112 Encounter for antineoplastic immunotherapy: Secondary | ICD-10-CM

## 2021-06-10 DIAGNOSIS — Z79899 Other long term (current) drug therapy: Secondary | ICD-10-CM

## 2021-06-10 DIAGNOSIS — Z8051 Family history of malignant neoplasm of kidney: Secondary | ICD-10-CM

## 2021-06-10 DIAGNOSIS — C7931 Secondary malignant neoplasm of brain: Secondary | ICD-10-CM

## 2021-06-10 DIAGNOSIS — Z794 Long term (current) use of insulin: Secondary | ICD-10-CM

## 2021-06-10 DIAGNOSIS — E1142 Type 2 diabetes mellitus with diabetic polyneuropathy: Secondary | ICD-10-CM

## 2021-06-10 DIAGNOSIS — Z7984 Long term (current) use of oral hypoglycemic drugs: Secondary | ICD-10-CM

## 2021-06-10 LAB — CBC WITH AUTO DIFFERENTIAL
BKR WAM ABSOLUTE IMMATURE GRANULOCYTES.: 0.03 x 1000/??L (ref 0.00–0.30)
BKR WAM ABSOLUTE LYMPHOCYTE COUNT.: 2.15 x 1000/??L (ref 0.60–3.70)
BKR WAM ABSOLUTE NRBC (2 DEC): 0 x 1000/??L (ref 0.00–1.00)
BKR WAM ANALYZER ANC: 5.95 x 1000/??L — ABNORMAL HIGH (ref 2.00–7.60)
BKR WAM BASOPHIL ABSOLUTE COUNT.: 0.06 x 1000/??L (ref 0.00–1.00)
BKR WAM BASOPHILS: 0.6 % (ref 0.0–1.4)
BKR WAM EOSINOPHIL ABSOLUTE COUNT.: 0.4 x 1000/ÂµL (ref 0.00–1.00)
BKR WAM EOSINOPHILS: 4.3 % (ref 0.0–5.0)
BKR WAM HEMATOCRIT (2 DEC): 40.2 % (ref 38.50–50.00)
BKR WAM HEMOGLOBIN: 13.3 g/dL (ref 13.2–17.1)
BKR WAM IMMATURE GRANULOCYTES: 0.3 % (ref 0.0–1.0)
BKR WAM LYMPHOCYTES: 23.2 % (ref 17.0–50.0)
BKR WAM MCH (PG): 29.7 pg (ref 27.0–33.0)
BKR WAM MCHC: 33.1 g/dL — ABNORMAL HIGH (ref 31.0–36.0)
BKR WAM MCV: 89.7 fL (ref 80.0–100.0)
BKR WAM MONOCYTE ABSOLUTE COUNT.: 0.69 x 1000/ÂµL (ref 0.00–1.00)
BKR WAM MONOCYTES: 7.4 % (ref 4.0–12.0)
BKR WAM MPV: 10.7 fL (ref 8.0–12.0)
BKR WAM NEUTROPHILS: 64.2 % (ref 39.0–72.0)
BKR WAM NUCLEATED RED BLOOD CELLS: 0 % (ref 0.0–1.0)
BKR WAM PLATELETS: 275 x1000/ÂµL (ref 150–420)
BKR WAM RDW-CV: 13.5 % (ref 11.0–15.0)
BKR WAM RED BLOOD CELL COUNT.: 4.48 M/??L (ref 4.00–6.00)
BKR WAM WHITE BLOOD CELL COUNT: 9.3 x1000/??L (ref 4.0–11.0)

## 2021-06-10 LAB — COMPREHENSIVE METABOLIC PANEL
BKR A/G RATIO: 1.1 (ref 1.0–2.2)
BKR ALANINE AMINOTRANSFERASE (ALT): 22 U/L (ref 9–59)
BKR ALBUMIN: 4.1 g/dL (ref 3.6–4.9)
BKR ALKALINE PHOSPHATASE: 79 U/L (ref 9–122)
BKR ANION GAP: 8 pg (ref 7–17)
BKR ASPARTATE AMINOTRANSFERASE (AST): 24 U/L (ref 10–35)
BKR AST/ALT RATIO: 1.1 x 1000/??L (ref 0.00–1.00)
BKR BILIRUBIN TOTAL: 0.3 mg/dL (ref <=1.2)
BKR BLOOD UREA NITROGEN: 30 mg/dL — ABNORMAL HIGH (ref 8–23)
BKR BUN / CREAT RATIO: 29.7 % — ABNORMAL HIGH (ref 8.0–23.0)
BKR CALCIUM: 9.8 mg/dL (ref 8.8–10.2)
BKR CHLORIDE: 100 mmol/L (ref 98–107)
BKR CO2: 29 mmol/L (ref 20–30)
BKR CREATININE: 1.01 mg/dL (ref 0.40–1.30)
BKR EGFR, CREATININE (CKD-EPI 2021): >60 mL/min/1.73m2 (ref >=60–1.00)
BKR GLOBULIN: 3.8 g/dL — ABNORMAL HIGH (ref 2.3–3.5)
BKR GLUCOSE: 160 mg/dL — ABNORMAL HIGH (ref 70–100)
BKR POTASSIUM: 5 mmol/L (ref 3.3–5.3)
BKR PROTEIN TOTAL: 7.9 g/dL (ref 6.6–8.7)
BKR SODIUM: 137 mmol/L (ref 136–144)

## 2021-06-10 LAB — LACTATE DEHYDROGENASE: BKR LACTATE DEHYDROGENASE: 160 U/L (ref 122–241)

## 2021-06-10 MED ORDER — LOPERAMIDE 2 MG TABLET
2 mg | ORAL_TABLET | ORAL | 4 refills | Status: AC
Start: 2021-06-10 — End: ?

## 2021-06-10 MED ORDER — EPINEPHRINE 0.3 MG/0.3 ML INJECTION, AUTO-INJECTOR
0.30.3 mg/ mL | INTRAMUSCULAR | Status: DC | PRN
Start: 2021-06-10 — End: 2021-06-10

## 2021-06-10 MED ORDER — DIPHENOXYLATE-ATROPINE 2.5 MG-0.025 MG TABLET
ORAL_TABLET | Freq: Four times a day (QID) | ORAL | 1 refills | Status: AC | PRN
Start: 2021-06-10 — End: 2022-03-17

## 2021-06-10 MED ORDER — FAMOTIDINE 4 MG/ML IN 0.9% SODIUM CHLORIDE (ADULT)
Freq: Once | INTRAVENOUS | Status: DC | PRN
Start: 2021-06-10 — End: 2021-06-10

## 2021-06-10 MED ORDER — NIVOLUMAB INFUSION
Freq: Once | INTRAVENOUS | Status: CP
Start: 2021-06-10 — End: ?
  Administered 2021-06-10: 16:00:00 100.000 mL/h via INTRAVENOUS

## 2021-06-10 MED ORDER — SODIUM CHLORIDE 0.9 % BOLUS (NEW BAG)
0.9 % | Freq: Once | INTRAVENOUS | Status: DC | PRN
Start: 2021-06-10 — End: 2021-06-10

## 2021-06-10 MED ORDER — HYDROCORTISONE SODIUM SUCCINATE 100 MG SOLUTION FOR INJECTION
100 mg | Freq: Once | INTRAVENOUS | Status: DC | PRN
Start: 2021-06-10 — End: 2021-06-10

## 2021-06-10 MED ORDER — ALBUTEROL SULFATE 2.5 MG/3 ML (0.083 %) SOLUTION FOR NEBULIZATION
2.530.083 mg /3 mL (0.083 %) | RESPIRATORY_TRACT | Status: DC | PRN
Start: 2021-06-10 — End: 2021-06-10

## 2021-06-10 MED ORDER — MEPERIDINE 25 MG/2.5 ML IN 0.9% SODIUM CHLORIDE
Freq: Once | INTRAVENOUS | Status: DC | PRN
Start: 2021-06-10 — End: 2021-06-10

## 2021-06-10 MED ORDER — DIPHENHYDRAMINE 50 MG/ML INJECTION (WRAPPED E-RX)
50 mg/mL | Freq: Once | INTRAVENOUS | Status: DC | PRN
Start: 2021-06-10 — End: 2021-06-10

## 2021-06-10 NOTE — Progress Notes
[facility] is here for C9D1 Nivo. Labs obtained and sent. Seen and cleared for treatment by A.Spino APRN. Orders signed reviewed and verified.  Pt doing well, plans to move to [place] in April. #22G PIV placed in L arm. Tolerated Nivo without issue. PIV flushed/needle d/c'd. Site benign and dressing applied. RV in 4 weeks for labs, provider and treatment. Left office stable. Aware to call in interim if any concerns or issues arise.

## 2021-06-11 LAB — T4, FREE: BKR FREE T4: 0.95 ng/dL

## 2021-06-11 LAB — TSH W/REFLEX TO FT4     (BH GH LMW Q YH): BKR THYROID STIMULATING HORMONE: 4.42 ??IU/mL — ABNORMAL HIGH

## 2021-06-11 NOTE — Progress Notes
NAME:  [facility] (03/09/1946)AGE: 76 y.o. 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  Noted 09/18/20 to have new brain lesions (too small for biopsy or GKRS).MOLECULAR PROFILING:0% tumor and immune cell PD-L1 staining50-gene panel:  DNA VARIANT DETECTED ? ? 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 ? 74% PRIOR TX:  - WLE and SLNB 03/03/19- Adjuvant Moderna clinical trial (?A Phase 2 Randomized Study of Adjuvant Immunotherapy with the Personalized Cancer Vaccine [id number] and Pembrolizumab Versus Pembrolizumab Alone After Complete Resection of High-Risk Melanoma? Protocol [id number], HIC 47829) C1D1 05/08/19. Last dose 04/27/20. Randomized to the pembrolizumab and vaccine arm.- 07/22/20 resection of recurrent LN in the right upper back (1.6 cm LN with 1.1 cm tumor deposit)ACTIVE TX:   ipilimumab (3 mg/kg) + nivolumab (1 mg/kg) - (10/08/20 - )Onc Hx: Jason [facility] (prefers to be called Jason [facility]) is a 76 y.o. male who worked for [facility] (retired at the end of 02/2019 after working there for 50 years) with a PMH of DM2 complicated by peripheral neuropathy, HLD, and AKs who is referred by [facility], his dermatologist for a right back metastatic melanoma.  He reports it appeared approx 10 months prior; his PCP originally thought it was a pimple.  [facility] at [facility] later evaluated it and also thought it was a pimple and injected it with intralesional kenalog x 2 and oral cephalexin without improvement.  The lesion increased in size and burned whenever he leaned against anything.  Denies night sweats and weight loss.  [facility] did a I&D on 01/07/19  with only slight bloody discharge, so a punch biopsy was obtain with path showing metastatic melanoma.  On 01/20/19, additional biopsies were done to try to evaluate a primary site, but we do not have this tissue.  Per patient report, these biopsies were negative. MRI brain 01/30/19 without intracranial disease.  PET done 02/07/19 showing preepiglottic FDG activity s/p ENT evaluation which was negative for any visible malignancy.  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 LDH elevated to 251 at diagnosis. His case was presented at the [facility] Melanoma tumor board on 02/13/19 and consensus was to ask ENT for random biopsies of the BOT and pre-epiglottic PET-avid areas, as we do not want to miss a head and neck cancer but otherwise not felt to be related to his melanoma.  The cervical LN were not thought to be related.  He should also have gastroenterology evaluation for a PET-avid stranding lesion in the transverse colon.  [facility] (ENT) performed BOT and vallecula random biopsies 02/20/19 which only showed chronic inflammation and no malignancy.  He had a colonoscopy by [facility] on 02/27/19 showing only a tubular adenoma in the sigmoid colon.  Overall, it was felt that his right mid back lesion is the site of his primary disease, and the metastatic melanoma pathology interpretation may have been complicated due to repeat I&Ds and steroid injections that may have altered the architecture of the primary lesion.  In the absence of any confirmed metastatic disease, he underwent WLE and LND with [facility] on 03/03/19.  Final path showed a 30 mm thick melanoma, 2 mitoses/mm2, non-brisk TILs, no tumor regression, LVI+, microsatellites+, negative margins, and 3/35 lymph nodes (largest LN 4.5 x 3  cm) were positive for melanoma with presence of lymphatic invasion without extranodal extension.He uses a cane sometimes as he has some balance issues due to neuropathy in his feet.  Otherwise, he is fully functional, active, and independent in ADLs/iADLs.  No hx of autoimmune disease.03/27/19 signed consent for the Moderna trial; randomized to pembro and vaccine arm.  Started treatment C1D1 05/08/19.04/24/19 noted to have wound dehiscence, non-infected, healing by secondary intent.  Re-instated VNA services to assist with dressing changes and packing.  Wound completely healed by 06/20/19.10/27/19, 01/12/20, and 04/24/20 surveillance scans NED.07/05/20 - [facility] chest showing an increased 1.0 cm subcutaneous nodule in the right upper back.  Bx 07/06/20 not diagnostic.  Excision by [facility] 07/22/20 showed metastatic melanoma involving 1 LN with 11 mm tumor deposit in a 1.6 cm LN.   09/18/20 - MRI brain showing a 8 mm left hippocampus lesion and a 6 mm lesion in the left mesial temporal lobe.  Reports new subtle memory issues.10/08/20 - C1 ipilimumab (3 mg/kg) + nivolumab (1 mg/kg)Interval ZO:XWRU returns for follow-up & nivo treatment today.  MRI brain  05/19/21 in [facility]. Showing evoling acute infarct. [facility] was called to given results. No concerning neurologically symptoms. Urgent referral place for neuro stroke team in [facility]. Earliest appt available was 07/14/21. [facility] is on the cancellation list. There are only two providers that come to the [facility] location and andy would prefer not to go into [facility] Reports intermittent diarrhea noted; mostly after eating certain foods. Denies any abdominal pain or blood in his stool. Take lomotil prn as need He reports vision is clear today has occasional dry eyes and uses visine Had an optho appt on 10/12 seen by another provider and was told he had  - macular degeneration. No other  concerns. No intervention or prescriptions ordered. His next appt is in 06/20/2021 with [facility] Has been busy with getting his affairs together has he is selling his home in [place] and moving to [place] April 1ECOG:  1Pain: 0/10Review of Systems:No fevers, chills, night sweats, lightheadedness, HA, CP, palpitations, wheezing, abd pain, constipation, blood in the urine or stools, dysuria.  Diabetic neuropathy in the hands and feet stable.   Positive responses on ROS as noted in interval hx. PAST MEDICAL HISTORY:Past Medical History: Diagnosis Date ? Diabetes mellitus (HC Code)  ? High cholesterol  ? Kidney stones   40 years ago ? 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 Malignant melanoma of torso excluding breast (HC Code)  SURGICAL HISTORY:Past Surgical History: Procedure Laterality Date ? COLONOSCOPY   ? CORONARY ANGIOPLASTY WITH STENT PLACEMENT Bilateral   patient denies ? laryngoscopy with biopsy  02/20/2019 ? ROTATOR CUFF REPAIR Left 1999 ? TONSILLECTOMY    age 47 ? TOTAL HIP ARTHROPLASTY Right 2002 FAMILY HISTORY:Family History Problem Relation Age of Onset ? Heart disease Mother  ? Bladder cancer Father 36 ? Kidney cancer Sister  ? Leukemia Brother 26 ? No Known Problems Daughter  ? No Known Problems Son  SOCIAL HISTORY:Social History Socioeconomic History ? Marital status: Divorced   Spouse name: Not on file ? Number of children: Not on file ? Years of education: Not on file ? Highest education level: Not on file Occupational History ? Not on file Tobacco Use ? Smoking status: Never ? Smokeless tobacco: Never Vaping Use ? Vaping Use: Never used Substance and Sexual Activity ? Alcohol use: Not Currently ? Drug  use: No ? Sexual activity: Not on file Other Topics Concern ? Not on file Social History Narrative  Divorced, works for [facility] in [place] will be retiring 03/17/19.  Has 1 son in [place] and 1 daughter in [place]. Llives in [place]  Social Determinants of Health Financial Resource Strain: Not on file Food Insecurity: Not on file Transportation Needs: Not on file Physical Activity: Not on file Stress: Not on file Social Connections: Not on file Intimate Partner Violence: Not on file Housing Stability: Not on file ALLERGIES:No Known AllergiesMEDICATIONS:Current Outpatient Medications Medication Sig ? acetaminophen (TYLENOL) 325 mg tablet Take 3 tablets (975 mg total) by mouth every 6 (six) hours as needed (mild to moderate pain). ? ALPRAZolam (XANAX) 0.5 mg tablet Take 1 tablet (0.5 mg total) by mouth as needed. ? aspirin 81 MG EC tablet Take 1 tablet (81 mg total) by mouth nightly. ? diphenoxylate-atropine (LOMOTIL) 2.5-0.025 mg per tablet Take 1 tablet by mouth 4 (four) times daily as needed for diarrhea. ? FREESTYLE LIBRE 14 DAY sensor kit 1 each by Other route every 14 (fourteen) days. ? glipiZIDE (GLUCOTROL XL) 10 MG 24 hr tablet Take 1 tablet (10 mg total) by mouth nightly. ? ibuprofen (ADVIL,MOTRIN) 200 mg tablet Take 2 tablets (400 mg total) by mouth every 6 (six) hours as needed (alternate with tylenol for mild to moderate pain). ? indomethacin (INDOCIN) 25 mg capsule 3 (three) times daily. ? levothyroxine (SYNTHROID, LEVOTHROID) 50 MCG tablet Take 1 tablet (50 mcg total) by mouth daily. ? loperamide (IMODIUM A-D) 2 mg tablet Take 1 tablet (2 mg total) by mouth See Admin Instructions. 4mg  ( 2 tablets ) in the morning. And then with each bm take an additional one table . Max dose per day 16mg  ? olopatadine (PATANOL) 0.1 % ophthalmic solution as needed (takes for eye irritation as needed). ? ondansetron (ZOFRAN-ODT) 8 mg disintegrating tablet Place 1 tablet (8 mg total) onto the tongue every 8 (eight) hours as needed for nausea. ? predniSONE (DELTASONE) 20 mg tablet Take 3 tablets (60 mg total) by mouth daily. Take with food. Please speak to office before starting this medication ? simvastatin (ZOCOR) 20 MG tablet Take 1 tablet (20 mg total) by mouth nightly. ? TOUJEO SOLOSTAR U-300 300 unit/mL (1.5 mL) pen Inject 70 Units under the skin nightly. ? triamcinolone (KENALOG) 0.1 % ointment Apply topically 2 (two) times daily as needed. To areas of rash. ? vit A/vit C/vit E/zinc/copper (PRESERVISION AREDS ORAL) Take by mouth 2 (two) times daily. ? zolpidem (AMBIEN) 10 mg tablet nightly.. No current facility-administered medications for this visit. VITALS: BP (!) 146/83  - Pulse 89  - Temp 98.2 ?F (36.8 ?C) (Temporal)  - Resp 18  - Wt 117.3 kg  - SpO2 99%  - BMI 36.58 kg/m?  PHYSICAL EXAM:Gen:  NAD, pleasant HEENT:  EOMI, PEARLLN:  No cervical, supraclavicular, axillary, or inguinal LAD. CV:  No murmurs, mild tachy, no m/r/g Pulm:  CTA b/lAbd:  Soft, no tenderness, no guarding or rebound, no organomegaly, no ascitesExt:  WWP, 1+ symmetric edema in the ankles.  Lymphedema present in the right arm, unchanged.Neuro:   No focal deficitsPsych:  Normal mood and affectSkin:  Scar on the right upper back well healed without evidence of recurrence. Small lateral skin puckering at the edges of the scar stable.  Flaking skin/dryness in the middle portion of the scar.  Right axillary scar well healed.  Scar from LN recurrence well healed. Eczema to umbilical area  LABS:Results  for orders placed or performed in visit on 06/10/21 Lactate dehydrogenase Result Value Ref Range  LD 160 122 - 241 U/L CBC auto differential Result Value Ref Range  WBC 9.3 4.0 - 11.0 x1000/?L  RBC 4.48 4.00 - 6.00 M/?L  Hemoglobin 13.3 13.2 - 17.1 g/dL  Hematocrit 60.45 40.98 - 50.00 %  MCV 89.7 80.0 - 100.0 fL  MCH 29.7 27.0 - 33.0 pg  MCHC 33.1 31.0 - 36.0 g/dL  RDW-CV 11.9 14.7 - 82.9 %  Platelets 275 150 - 420 x1000/?L  MPV 10.7 8.0 - 12.0 fL  Neutrophils 64.2 39.0 - 72.0 %  Lymphocytes 23.2 17.0 - 50.0 %  Monocytes 7.4 4.0 - 12.0 %  Eosinophils 4.3 0.0 - 5.0 %  Basophil 0.6 0.0 - 1.4 %  Immature Granulocytes 0.3 0.0 - 1.0 %  nRBC 0.0 0.0 - 1.0 %  ANC(Abs Neutrophil Count) 5.95 2.00 - 7.60 x 1000/?L  Absolute Lymphocyte Count 2.15 0.60 - 3.70 x 1000/?L  Monocyte Absolute Count 0.69 0.00 - 1.00 x 1000/?L  Eosinophil Absolute Count 0.40 0.00 - 1.00 x 1000/?L  Basophil Absolute Count 0.06 0.00 - 1.00 x 1000/?L  Absolute Immature Granulocyte Count 0.03 0.00 - 0.30 x 1000/?L  Absolute nRBC 0.00 0.00 - 1.00 x 1000/?L Comprehensive metabolic panel Result Value Ref Range  Sodium 137 136 - 144 mmol/L  Potassium 5.0 3.3 - 5.3 mmol/L  Chloride 100 98 - 107 mmol/L  CO2 29 20 - 30 mmol/L  Anion Gap 8 7 - 17  Glucose 160 (H) 70 - 100 mg/dL  BUN 30 (H) 8 - 23 mg/dL  Creatinine 5.62 1.30 - 1.30 mg/dL  Calcium 9.8 8.8 - 86.5 mg/dL  BUN/Creatinine Ratio 78.4 (H) 8.0 - 23.0  Total Protein 7.9 6.6 - 8.7 g/dL  Albumin 4.1 3.6 - 4.9 g/dL  Total Bilirubin 0.3 <=6.9 mg/dL  Alkaline Phosphatase 79 9 - 122 U/L  Alanine Aminotransferase (ALT) 22 9 - 59 U/L  Aspartate Aminotransferase (AST) 24 10 - 35 U/L  Globulin 3.8 (H) 2.3 - 3.5 g/dL  A/G Ratio 1.1 1.0 - 2.2  AST/ALT Ratio 1.1 See Comment  eGFR (Creatinine) >60 >=60 mL/min/1.59m2 IMAGING/PATH:MRI Brain with and without IV ContrastResult Date: 2/2/2023MRI BRAIN W WO IV CONTRAST HISTORY: Melanoma, assess treatment response. COMPARISON: MRI brain 01/16/2021 and 09/18/2020 TECHNIQUE: MRI of the brain was performed utilizing a multiplanar, multisequence technique before and after intravenous contrast administration (24cc Dotarem). FINDINGS: An ovoid 0.9 x 1.6 cm focus of hyperintense signal is seen in the left corona radiata/posterior frontal white matter on the diffusion-weighted sequence with corresponding slightly hypointense signal on the ADC map. There is mild hyperintensity on FLAIR and T2-weighted sequences along with enhancement after intravenous contrast administration. The left temporal lobe lesions seen on the June study now only show a tiny focus of enhancement adjacent to the temporal horn such as on series 12 image 63. Moderate atrophy is present. Signal throughout the cerebral white matter presumably chronic ischemic. There is no hydrocephalus or midline shift. No paranasal sinus opacification is noted. No tympanomastoid opacification is evident. The patient has had bilateral intraocular lens replacement. Previously identified left temporal lobe lesions seen in June 2022 now only visible as a tiny focus of enhancement along left temporal horn, similar to October 2022. Focus of enhancing signal now seen in left corona radiata/frontal white matter with slight restricted diffusion, suspected to reflect evolving subacute infarct. Continued follow-up will allow for assessment of full evolution. Reported and Signed by:  Jolyn Lent, MD  07/22/20: SOFT TISSUE, RIGHT UPPER BACK, EXCISION: ? ? ? ?- METASTATIC MELANOMA INVOLVING A LYMPH NODE (1/1) ? ? ? ? ? - SIZE OF THE LARGEST TUMOR DEPOSIT: 11 MM ? ? ? ? ? - SIZE OF THE LYMPH NODE: 1.6 CM WLE and SLN 03/03/19:LYMPH NODES, RIGHT AXILLARY LEVELS 1, 2 AND 3, LYMPH NODE DISSECTION: ?  ? ? - ?THREE OF THIRTY-FIVE LYMPH NODES, POSITIVE FOR MELANOMA (3 OF 35) WITH LARGEST INVOLVED LYMPH NODE MEASURING 4.5 X 3 CM ? ? ?- LYMPHATIC INVASION IDENTIFIED ? ? ?- NO EXTRANODAL EXTENSION IDENTIFIED 1. ?SKIN, BACK, RIGHT UPPER, EXCISION: ? ? ? ?- MALIGNANT MELANOMA, SEE NOTE AND SYNOPTIC SUMMARY Note: Sections show a tumor composed of S100 positive epithelioid cells within the dermis and subcutaneous tissue. Cytokeratin AE1/AE3 and desmin are negative. A junctional component is not identified. The findings would be compatible with a primary dermal melanoma or a metastatic lesion. Clinical correlation is suggested. 2. ?SKIN, BACK, EXCISION: ? ? ? ?- BENIGN FIBROADIPOSE TISSUE AND SKELETAL MUSCLE SYNOPTIC SUMMARY MELANOMA OF THE SKIN Tumor Site: ? ? Back Laterality: ? ? Right Procedure: ? ? Primary excision Maximum Tumor Thickness (Depth): ? ? 30 mm Ulceration: ? ? Not identified Mitotic Rate (Mitoses/mm2): ? ? 2 Anatomic Level: ? ? V (Melanoma invades subcutis) Growth Phase: ? ? Vertical Histologic Type: ? ? Melanoma, NOS Tumor-Infiltrating Lymphocytes: ? ? Present, non-brisk Tumor Regression: ? ? Not identifed Lymphovascular Invasion: ? ? Present Microsatellitosis: ? ? Present Margins ? ? ?Peripheral Margins: ? ? Uninvolved Deep Margin: ? ? Uninvolved Stage (AJCC 8th Ed): ? ? pT4a Nx Base of the tongue and vallecula random biopsies 02/20/19: 1. ?TONGUE, LEFT, BASE, BIOPSY: ? ? ? ?- LYMPHOID TISSUE HYPERPLASIA ? ? ?- NEGATIVE FOR MALIGNANCY 2. ?THROAT, VALLECULA, BIOPSY: ? ?  ? - LYMPHOID TISSUE HYPERPLASIA AND FOCAL CHRONIC INFLAMMATION. SEE NOTE. ? ? ?- NEGATIVE FOR MALIGNANCY ? ? ? ? ? Note: Immunostain of Sox-10 to investigate focal pigment is negative, supporting above interpretation. 3. ?THROAT, LEFT VALLECULA, BIOPSY: ? ? ? ?- LYMPHOID TISSUE HYPERPLASIA ? ? ?- NEGATIVE FOR MALIGNANCY 4. ?THROAT, RIGHT VALLECULA, BIOPSY: ? ? ? ? ? ? - LYMPHOID TISSUE HYPERPLASIA AND FOCAL CHRONIC INFLAMMATION ? ? ?- NEGATIVE FOR MALIGNANCY 5. ?TONGUE, RIGHT BASE, BIOPSY: ? ? ? ? ? ? - LYMPHOID TISSUE HYPERPLASIA AND FOCAL CHRONIC INFLAMMATION ? ? ?- NEGATIVE FOR MALIGNANCY Pathology (reviewed at [facility]), collected 01/07/19:DIAGNOSIS: ? 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 Clinical pathological correlation is recommended. MICROSCOPIC DESCRIPTION: There are atypical cells in a nodule in the dermis. The cells are positive with SOX-10 and by report are negative with LCA, Kappa, Lambda, and a cytokeratin. The cells are also positive with S-100 by report.ASSESSMENT/PLAN:[name] is a 76 y.o. 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  Given the thickness of his primary melanoma and microsatellitosis, we felt that his melanoma-specific survival is closer to a stage IIID:  60% over 10 years with stage IIIC disease versus 24% with stage IIID.  He opted to proceed with the Moderna clinical trial (?A Phase 2 Randomized Study of  Adjuvant Immunotherapy with the Personalized Cancer Vaccine [id number] and Pembrolizumab Versus Pembrolizumab Alone After Complete Resection of High-Risk Melanoma?) and completed 1 year of adjuvant therapy on trial without issues.  He developed an enlarged 1 cm SQ nodule overlying the right scapula noted on scans 07/05/20 s/p excision showing a 1.6 cm LN containing a 11 mm deposit of melanoma.MRI brain 09/18/20 showed a 8 mm left hippocampus lesion and a 6 mm lesion in the left mesial temporal lobe concerning for brain mets.  Reviewed the case with Jason. Winfred Leeds; lesions are too small and ill-defined to biopsy and would hold off on GKRS for now.  Given high suspicion for metastatic disease, recurrence of his disease recently, and fairly good performance status, would opt to be aggressive.  I reviewed with [facility] that ideally, we would want to get a biopsy to establish stage IV disease, but given that we know his disease is aggressive and presented again shortly after discontinuation of adjuvant anti-PD-1, we can presume that this is metastatic disease and start a new line of systemic therapy.  Discussed the available brain met clinical trials, but he wants to move to [facility] and would like to pursue standard of care. Discussed data from [employer] [facility] utilizing ipi 3 + nivo 1 in brain metastasis which demonstrated similar intracranial and extracranial efficacy. [facility] agreed to proceed; thus far tx has been complicated by G1-2 diarrhea which is well controlled with imodium/lomotil. [place] CAP 05/03/21 shows multiple non-specific areas of diffuse enhancement in the b/l lungs which requires f/u.- labs reviewed, BUN 30; creat 1.01 and improved will follow, he has been increasing oral water intake, glucose 160 . Cbc wnl , TSH in process  ; will monitor- Ok to proceed with Nivo cycle 8 .- Diarrhea:  To monitor for now as seems to be a food-related isolated event limited to the last 24 hours- Ongoing intermittent blurry vision f/u with his eye doctor on 36/2023; was told he had macular degeneration. No evidence of uveitis.- MRI brain - Focus of enhancing signal now seen in left corona radiata/frontal white matter with slight restricted diffusion, suspected to reflect evolving subacute infarct. Second read interruption ordered pending official read. Urgent stroke neuro referral placed; earliest appt 07/14/21. [facility] prefers [place] location and does not want to to drive into [facility]. - will find referrals for oncolgist is his area. [place] area code [id number]. He would like to avoid [place] area- [place] cap next due in April- routine f/u with dermatologist for FBSEs: every 4 months. - RTC in 4 weeks for nivo monotherapy.35 min spent in chart review and direct care on the day of the encounter.Era Bumpers APRN FNP [facility]

## 2021-06-17 ENCOUNTER — Encounter: Admit: 2021-06-17 | Payer: PRIVATE HEALTH INSURANCE

## 2021-06-17 DIAGNOSIS — C4359 Malignant melanoma of other part of trunk: Secondary | ICD-10-CM

## 2021-06-17 DIAGNOSIS — C7931 Secondary malignant neoplasm of brain: Secondary | ICD-10-CM

## 2021-06-21 ENCOUNTER — Telehealth: Admit: 2021-06-21 | Payer: PRIVATE HEALTH INSURANCE

## 2021-06-21 NOTE — Telephone Encounter
[facility]land is moving to [facility] April 1st.He asked that we find a provider to transfer care. I called [facility] [phone] [address] [place] [phone] - faxI faxed over most recent noted and most recent images. I also booked andy for  cap and mri brain  3/13/23Reviewed with dr tran since andy was due for scans in April we would do them now [place] a [place]ys house phone that I would call him back to discuss all of this.Electronically Signed by Bo Mcclintock, APRN, June 21, 2021

## 2021-06-22 ENCOUNTER — Telehealth: Admit: 2021-06-22 | Payer: PRIVATE HEALTH INSURANCE

## 2021-06-22 NOTE — Telephone Encounter
Spoke to [facility] from [facility] in [place]. Dr Bayard Males will be taking over [facility]land's care. They received the faxes and additional info sent over. Provided my phone number for call back and left message with [facility] on his cell. Will call back to review with him Electronically Signed by Bo Mcclintock, APRN, June 22, 2021

## 2021-06-23 ENCOUNTER — Telehealth: Admit: 2021-06-23 | Payer: PRIVATE HEALTH INSURANCE

## 2021-06-23 NOTE — Telephone Encounter
I called [facility] and updated him about his scans. He needs mri brain to be done in [place] ( cant fit his head in the machine at [place] cap confirmed and the mri brain changed to 3/28  location,Also he knows dr Gerilyn Pilgrim from [facility] will be reaching out to book a new patient appt Electronically Signed by Bo Mcclintock, APRN, June 23, 2021

## 2021-06-27 ENCOUNTER — Ambulatory Visit: Admit: 2021-06-27 | Payer: BLUE CROSS/BLUE SHIELD

## 2021-06-27 ENCOUNTER — Inpatient Hospital Stay: Admit: 2021-06-27 | Discharge: 2021-06-27 | Payer: BLUE CROSS/BLUE SHIELD

## 2021-06-27 ENCOUNTER — Inpatient Hospital Stay: Admit: 2021-06-27 | Payer: PRIVATE HEALTH INSURANCE

## 2021-06-27 DIAGNOSIS — K8020 Calculus of gallbladder without cholecystitis without obstruction: Secondary | ICD-10-CM

## 2021-06-27 DIAGNOSIS — C4359 Malignant melanoma of other part of trunk: Secondary | ICD-10-CM

## 2021-06-27 DIAGNOSIS — C7931 Secondary malignant neoplasm of brain: Secondary | ICD-10-CM

## 2021-06-27 DIAGNOSIS — N200 Calculus of kidney: Secondary | ICD-10-CM

## 2021-06-27 DIAGNOSIS — K5792 Diverticulitis of intestine, part unspecified, without perforation or abscess without bleeding: Secondary | ICD-10-CM

## 2021-06-27 DIAGNOSIS — D381 Neoplasm of uncertain behavior of trachea, bronchus and lung: Secondary | ICD-10-CM

## 2021-06-27 MED ORDER — SODIUM CHLORIDE 0.9 % BOLUS (NEW BAG)
0.9 % | Freq: Once | INTRAVENOUS | Status: CP
Start: 2021-06-27 — End: ?
  Administered 2021-06-27: 17:00:00 0.9 mL/h via INTRAVENOUS

## 2021-06-27 MED ORDER — IOHEXOL 350 MG IODINE/ML INTRAVENOUS SOLUTION
350 mg iodine/mL | Freq: Once | INTRAVENOUS | Status: CP | PRN
Start: 2021-06-27 — End: ?
  Administered 2021-06-27: 17:00:00 350 mL via INTRAVENOUS

## 2021-06-27 MED ORDER — SODIUM CHLORIDE 0.9 % (FLUSH) INJECTION SYRINGE
0.9 % | INTRAVENOUS | Status: DC | PRN
Start: 2021-06-27 — End: 2021-07-02

## 2021-07-01 ENCOUNTER — Inpatient Hospital Stay: Admit: 2021-07-01 | Discharge: 2021-07-01 | Payer: BLUE CROSS/BLUE SHIELD

## 2021-07-01 ENCOUNTER — Encounter: Admit: 2021-07-01 | Payer: PRIVATE HEALTH INSURANCE

## 2021-07-01 DIAGNOSIS — C7931 Secondary malignant neoplasm of brain: Secondary | ICD-10-CM

## 2021-07-08 ENCOUNTER — Ambulatory Visit: Admit: 2021-07-08 | Payer: BLUE CROSS/BLUE SHIELD

## 2021-07-08 ENCOUNTER — Encounter: Admit: 2021-07-08 | Payer: PRIVATE HEALTH INSURANCE

## 2021-07-08 ENCOUNTER — Inpatient Hospital Stay: Admit: 2021-07-08 | Discharge: 2021-07-08 | Payer: BLUE CROSS/BLUE SHIELD

## 2021-07-08 DIAGNOSIS — C7931 Secondary malignant neoplasm of brain: Secondary | ICD-10-CM

## 2021-07-08 DIAGNOSIS — E7800 Pure hypercholesterolemia, unspecified: Secondary | ICD-10-CM

## 2021-07-08 DIAGNOSIS — C4359 Malignant melanoma of other part of trunk: Secondary | ICD-10-CM

## 2021-07-08 DIAGNOSIS — N200 Calculus of kidney: Secondary | ICD-10-CM

## 2021-07-08 DIAGNOSIS — E119 Type 2 diabetes mellitus without complications: Secondary | ICD-10-CM

## 2021-07-08 LAB — COMPREHENSIVE METABOLIC PANEL
BKR A/G RATIO: 1.1 x 1000/??L (ref 1.0–2.2)
BKR ALANINE AMINOTRANSFERASE (ALT): 22 U/L (ref 9–59)
BKR ALBUMIN: 4 g/dL (ref 3.6–4.9)
BKR ALKALINE PHOSPHATASE: 76 U/L (ref 9–122)
BKR ANION GAP: 10 (ref 7–17)
BKR ASPARTATE AMINOTRANSFERASE (AST): 23 U/L (ref 10–35)
BKR AST/ALT RATIO: 1
BKR BILIRUBIN TOTAL: 0.2 mg/dL (ref 0.0–<=1.2)
BKR BLOOD UREA NITROGEN: 23 mg/dL (ref 8–23)
BKR BUN / CREAT RATIO: 25.6 — ABNORMAL HIGH (ref 8.0–23.0)
BKR CALCIUM: 9.1 mg/dL (ref 8.8–10.2)
BKR CHLORIDE: 106 mmol/L (ref 98–107)
BKR CO2: 26 mmol/L (ref 20–30)
BKR CREATININE: 0.9 mg/dL (ref 0.40–1.30)
BKR EGFR, CREATININE (CKD-EPI 2021): >60 mL/min/{1.73_m2} (ref >=60)
BKR GLOBULIN: 3.6 g/dL — ABNORMAL HIGH (ref 2.3–3.5)
BKR GLUCOSE: 148 mg/dL — ABNORMAL HIGH (ref 70–100)
BKR POTASSIUM: 4.4 mmol/L (ref 3.3–5.3)
BKR PROTEIN TOTAL: 7.6 g/dL (ref 6.6–8.7)
BKR SODIUM: 142 mmol/L (ref 136–144)
BKR WAM HEMOGLOBIN: 142 mmol/L (ref 136–144)

## 2021-07-08 LAB — CBC WITH AUTO DIFFERENTIAL
BKR WAM ABSOLUTE IMMATURE GRANULOCYTES.: 0.03 x 1000/??L (ref 0.00–0.30)
BKR WAM ABSOLUTE LYMPHOCYTE COUNT.: 1.95 x 1000/??L — ABNORMAL HIGH (ref 0.60–3.70)
BKR WAM ABSOLUTE NRBC (2 DEC): 0 x 1000/??L (ref 0.00–1.00)
BKR WAM ANALYZER ANC: 6.4 x 1000/ÂµL (ref 2.00–7.60)
BKR WAM BASOPHIL ABSOLUTE COUNT.: 0.05 x 1000/??L (ref 0.00–1.00)
BKR WAM BASOPHILS: 0.5 % (ref 0.0–1.4)
BKR WAM EOSINOPHIL ABSOLUTE COUNT.: 0.25 x 1000/??L (ref 0.00–1.00)
BKR WAM EOSINOPHILS: 2.6 % (ref 0.0–5.0)
BKR WAM HEMATOCRIT (2 DEC): 41.3 % (ref 38.50–50.00)
BKR WAM IMMATURE GRANULOCYTES: 0.3 % (ref 0.0–1.0)
BKR WAM LYMPHOCYTES: 20.6 % — ABNORMAL HIGH (ref 17.0–50.0)
BKR WAM MCH (PG): 29.1 pg (ref 27.0–33.0)
BKR WAM MCHC: 32 g/dL (ref 31.0–36.0)
BKR WAM MONOCYTE ABSOLUTE COUNT.: 0.77 x 1000/ÂµL (ref 0.00–1.00)
BKR WAM MONOCYTES: 8.1 % (ref 4.0–12.0)
BKR WAM MPV: 10.3 fL (ref 8.0–12.0)
BKR WAM NEUTROPHILS: 67.9 % (ref 39.0–72.0)
BKR WAM NUCLEATED RED BLOOD CELLS: 0 % (ref 0.0–1.0)
BKR WAM PLATELETS: 265 x1000/??L (ref 150–420)
BKR WAM RDW-CV: 13.7 % (ref 11.0–15.0)
BKR WAM RED BLOOD CELL COUNT.: 4.53 M/??L (ref 4.00–6.00)
BKR WAM WHITE BLOOD CELL COUNT: 9.5 x1000/??L (ref 4.0–11.0)

## 2021-07-08 LAB — T4, FREE
BKR FREE T4: 0.81 ng/dL
BKR WAM MCV: 0.81 ng/dL (ref 80.0–100.0)

## 2021-07-08 LAB — TSH W/REFLEX TO FT4     (BH GH LMW Q YH): BKR THYROID STIMULATING HORMONE: 7.66 ??IU/mL — ABNORMAL HIGH

## 2021-07-08 NOTE — Progress Notes
NAME:  Jason Rowland (November 15, 1945)AGE: 76 y.o. 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  Noted 09/18/20 to have new brain lesions (too small for biopsy or GKRS).MOLECULAR PROFILING:0% tumor and immune cell PD-L1 staining50-gene panel:  DNA VARIANT DETECTED ? ? 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 ? 74% PRIOR TX:  - WLE and SLNB 03/03/19- Adjuvant Moderna clinical trial (?A Phase 2 Randomized Study of Adjuvant Immunotherapy with the Personalized Cancer Vaccine [id number] and Pembrolizumab Versus Pembrolizumab Alone After Complete Resection of High-Risk Melanoma? Protocol [id number], HIC 47829) C1D1 05/08/19. Last dose 04/27/20. Randomized to the pembrolizumab and vaccine arm.- 07/22/20 resection of recurrent LN in the right upper back (1.6 cm LN with 1.1 cm tumor deposit)ACTIVE TX:   ipilimumab (3 mg/kg) + nivolumab (1 mg/kg) - (10/08/20 - )Onc Hx: Jason Rowland (prefers to be called Jason Rowland) is a 76 y.o. male who worked for [employer] [facility] (retired at the end of 02/2019 after working there for 50 years) with a PMH of DM2 complicated by peripheral neuropathy, HLD, and AKs who is referred by Jason Rowland, his dermatologist for a right back metastatic melanoma.  He reports it appeared approx 10 months prior; his PCP originally thought it was a pimple.  [facility]land at [facility] later evaluated it and also thought it was a pimple and injected it with intralesional kenalog x 2 and oral cephalexin without improvement.  The lesion increased in size and burned whenever he leaned against anything.  Denies night sweats and weight loss.  [facility] did a I&D on 01/07/19  with only slight bloody discharge, so a punch biopsy was obtain with path showing metastatic melanoma.  On 01/20/19, additional biopsies were done to try to evaluate a primary site, but we do not have this tissue.  Per patient report, these biopsies were negative. MRI brain 01/30/19 without intracranial disease.  PET done 02/07/19 showing preepiglottic FDG activity s/p ENT evaluation which was negative for any visible malignancy.  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 LDH elevated to 251 at diagnosis. His case was presented at the [facility] Melanoma tumor board on 02/13/19 and consensus was to ask ENT for random biopsies of the BOT and pre-epiglottic PET-avid areas, as we do not want to miss a head and neck cancer but otherwise not felt to be related to his melanoma.  The cervical LN were not thought to be related.  He should also have gastroenterology evaluation for a PET-avid stranding lesion in the transverse colon.  [facility]land (ENT) performed BOT and vallecula random biopsies 02/20/19 which only showed chronic inflammation and no malignancy.  He had a colonoscopy by [facility]land on 02/27/19 showing only a tubular adenoma in the sigmoid colon.  Overall, it was felt that his right mid back lesion is the site of his primary disease, and the metastatic melanoma pathology interpretation may have been complicated due to repeat I&Ds and steroid injections that may have altered the architecture of the primary lesion.  In the absence of any confirmed metastatic disease, he underwent WLE and LND with [facility] on 03/03/19.  Final path showed a 30 mm thick melanoma, 2 mitoses/mm2, non-brisk TILs, no tumor regression, LVI+, microsatellites+, negative margins, and 3/35 lymph nodes (largest LN 4.5 x 3  cm) were positive for melanoma with presence of lymphatic invasion without extranodal extension.He uses a cane sometimes as he has some balance issues due to neuropathy in his feet.  Otherwise, he is fully functional, active, and independent in ADLs/iADLs.  No hx of autoimmune disease.03/27/19 signed consent for the Moderna trial; randomized to pembro and vaccine arm.  Started treatment C1D1 05/08/19.04/24/19 noted to have wound dehiscence, non-infected, healing by secondary intent.  Re-instated VNA services to assist with dressing changes and packing.  Wound completely healed by 06/20/19.10/27/19, 01/12/20, and 04/24/20 surveillance scans NED.07/05/20 - [place] chest showing an increased 1.0 cm subcutaneous nodule in the right upper back.  Bx 07/06/20 not diagnostic.  Excision by [facility] 07/22/20 showed metastatic melanoma involving 1 LN with 11 mm tumor deposit in a 1.6 cm LN.   09/18/20 - MRI brain showing a 8 mm left hippocampus lesion and a 6 mm lesion in the left mesial temporal lobe.  Reports new subtle memory issues.10/08/20 - C1 ipilimumab (3 mg/kg) + nivolumab (1 mg/kg)Interval ZO:XWRU returns for follow-up & and assessment of sxs for  nivo treatment .  Scans were to be done in April. So to ensure there were done in a timely fashion they were scheduled prior to him relocating to [place] .MRI brain had to be rescheduled to 07/12/21 because of the MRI machine size . [place] cap completed on 3/20/23Ct scans were sent over to [facility] for second read and presented at [facility]. Results :pneumonitis. [facility] attempts to chronic DOE with exertion however no worsening SOB. He also reports an episodes of cough/ nasal congestion upon waking however no continued cough Reports intermittent diarrhea  mostly after eating certain foods. Denies any active  abdominal pain or blood in his stool. Takes imodium daily lomotil prn as need He reports vision is clear today has occasional dry eyes and uses visine Had an optho appt on 10/12 seen by another provider and was told he had  - macular degeneration. No other  concerns. No intervention or prescriptions ordered. He had another appt on 06/20/2021 and a new eye drops were rxed and he will pick them in [place] in two weeks Has been busy with getting his affairs together has he is selling his home in [place] and moving to [place] April 1; he will be driving with is son. [facility]land preferred an oncologist that was close to his home. A referral was placed to Dr Bayard Males from [facility]:[address] [phone] JYNWGN,[phone] - fax, I was in contact with the new patient coordinator Morrie Sheldon, records faxed over and the office was going to be in contact with [facility] to set up an appt. Not done so as of yet. Called and left another message today.Would like to set up the appt and pass along Dr Roselind Messier number to provide hand off to Dr Linna Hoff has been been taking his synthroid at night along with all of this medications shortly after eating. His TSH was being monitored as it was rising. As far as refills [facility]land said that he will still getting medications from him PCP as he is still planning to come to [place] often.  ECOG:  1Pain: 0/10Review of Systems:No fevers, chills, night sweats, lightheadedness, HA, CP, palpitations, wheezing, abd pain, constipation, blood in the urine or stools, dysuria.  Diabetic neuropathy in the hands and feet stable.   Positive responses on ROS as noted in interval hx. PAST MEDICAL HISTORY:Past Medical History: Diagnosis Date ? Diabetes mellitus (HC  Code)  ? High cholesterol  ? Kidney stones   40 years ago ? 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 Malignant melanoma of torso excluding breast (HC Code)  SURGICAL HISTORY:Past Surgical History: Procedure Laterality Date ? COLONOSCOPY   ? CORONARY ANGIOPLASTY WITH STENT PLACEMENT Bilateral   patient denies ? laryngoscopy with biopsy  02/20/2019 ? ROTATOR CUFF REPAIR Left 1999 ? TONSILLECTOMY    age 89 ? TOTAL HIP ARTHROPLASTY Right 2002 FAMILY HISTORY:Family History Problem Relation Age of Onset ? Heart disease Mother  ? Bladder cancer Father 21 ? Kidney cancer Sister  ? Leukemia Brother 13 ? No Known Problems Daughter  ? No Known Problems Son  SOCIAL HISTORY:Social History Socioeconomic History ? Marital status: Divorced   Spouse name: Not on file ? Number of children: Not on file ? Years of education: Not on file ? Highest education level: Not on file Occupational History ? Not on file Tobacco Use ? Smoking status: Never ? Smokeless tobacco: Never Vaping Use ? Vaping Use: Never used Substance and Sexual Activity ? Alcohol use: Not Currently ? Drug use: No ? Sexual activity: Not on file Other Topics Concern ? Not on file Social History Narrative  Divorced, works for [employer] in [place] will be retiring 03/17/19.  Has 1 son in [place] and 1 daughter in [place]. Llives in [place]  Social Determinants of Health Financial Resource Strain: Not on file Food Insecurity: Not on file Transportation Needs: Not on file Physical Activity: Not on file Stress: Not on file Social Connections: Not on file Intimate Partner Violence: Not on file Housing Stability: Not on file ALLERGIES:No Known AllergiesMEDICATIONS:Current Outpatient Medications Medication Sig ? acetaminophen (TYLENOL) 325 mg tablet Take 3 tablets (975 mg total) by mouth every 6 (six) hours as needed (mild to moderate pain). ? ALPRAZolam (XANAX) 0.5 mg tablet Take 1 tablet (0.5 mg total) by mouth as needed. ? aspirin 81 MG EC tablet Take 1 tablet (81 mg total) by mouth nightly. ? diphenoxylate-atropine (LOMOTIL) 2.5-0.025 mg per tablet Take 1 tablet by mouth 4 (four) times daily as needed for diarrhea. ? FREESTYLE LIBRE 14 DAY sensor kit 1 each by Other route every 14 (fourteen) days. ? glipiZIDE (GLUCOTROL XL) 10 MG 24 hr tablet Take 1 tablet (10 mg total) by mouth nightly. ? ibuprofen (ADVIL,MOTRIN) 200 mg tablet Take 2 tablets (400 mg total) by mouth every 6 (six) hours as needed (alternate with tylenol for mild to moderate pain). ? indomethacin (INDOCIN) 25 mg capsule 3 (three) times daily. (Patient not taking: Reported on 06/10/2021) ? levothyroxine (SYNTHROID, LEVOTHROID) 50 MCG tablet Take 1 tablet (50 mcg total) by mouth daily. ? loperamide (IMODIUM A-D) 2 mg tablet Take 1 tablet (2 mg total) by mouth See Admin Instructions. 4mg  ( 2 tablets ) in the morning. And then with each bm take an additional one table . Max dose per day 16mg  ? olopatadine (PATANOL) 0.1 % ophthalmic solution as needed (takes for eye irritation as needed). ? ondansetron (ZOFRAN-ODT) 8 mg disintegrating tablet Place 1 tablet (8 mg total) onto the tongue every 8 (eight) hours as needed for nausea. ? predniSONE (DELTASONE) 20 mg tablet Take 3 tablets (60 mg total) by mouth daily. Take with food. Please speak to office before starting this medication ? simvastatin (ZOCOR) 20 MG tablet Take 1 tablet (20 mg total) by mouth nightly. ? TOUJEO SOLOSTAR U-300 300 unit/mL (1.5 mL) pen  Inject 70 Units under the skin nightly. ? triamcinolone (KENALOG) 0.1 % ointment Apply topically 2 (two) times daily as needed. To areas of rash. ? vit A/vit C/vit E/zinc/copper (PRESERVISION AREDS ORAL) Take by mouth 2 (two) times daily. ? zolpidem (AMBIEN) 10 mg tablet nightly.. No current facility-administered medications for this visit. VITALS: BP (!) 148/80 (Site: l a, Position: Sitting, Cuff Size: Large)  - Pulse 77  - Temp 97.3 ?F (36.3 ?C) (Temporal)  - Resp 18  - Wt 117.9 kg  - SpO2 99%  - BMI 36.78 kg/m?  PHYSICAL EXAM:Gen:  NAD, pleasant HEENT:  EOMI, PEARLLN:  No cervical, supraclavicular, axillary, or inguinal LAD. CV:  No murmurs, mild tachy, no m/r/g Pulm:  CTA b/lAbd:  Soft, no tenderness, no guarding or rebound, no organomegaly, no ascitesExt:  WWP, 1+ symmetric edema in the ankles. Lymphedema present in the right arm, unchanged.Neuro:   No focal deficitsPsych:  Normal mood and affectSkin:  Scar on the right upper back well healed without evidence of recurrence. Small lateral skin puckering at the edges of the scar stable.  Flaking skin/dryness in the middle portion of the scar.  Right axillary scar well healed.  Scar from LN recurrence well healed. Eczema to umbilical area  LABS:Results for orders placed or performed during the hospital encounter of 07/08/21 TSH w/reflex to FT4     (BH GH LMW Q YH) Result Value Ref Range  Thyroid Stimulating Hormone 7.660 (H) See Comment ?IU/mL CBC auto differential Result Value Ref Range  WBC 9.5 4.0 - 11.0 x1000/?L  RBC 4.53 4.00 - 6.00 M/?L  Hemoglobin 13.2 13.2 - 17.1 g/dL  Hematocrit 16.10 96.04 - 50.00 %  MCV 91.2 80.0 - 100.0 fL  MCH 29.1 27.0 - 33.0 pg  MCHC 32.0 31.0 - 36.0 g/dL  RDW-CV 54.0 98.1 - 19.1 %  Platelets 265 150 - 420 x1000/?L  MPV 10.3 8.0 - 12.0 fL  Neutrophils 67.9 39.0 - 72.0 %  Lymphocytes 20.6 17.0 - 50.0 %  Monocytes 8.1 4.0 - 12.0 %  Eosinophils 2.6 0.0 - 5.0 %  Basophil 0.5 0.0 - 1.4 %  Immature Granulocytes 0.3 0.0 - 1.0 %  nRBC 0.0 0.0 - 1.0 %  ANC(Abs Neutrophil Count) 6.40 2.00 - 7.60 x 1000/?L  Absolute Lymphocyte Count 1.95 0.60 - 3.70 x 1000/?L  Monocyte Absolute Count 0.77 0.00 - 1.00 x 1000/?L  Eosinophil Absolute Count 0.25 0.00 - 1.00 x 1000/?L  Basophil Absolute Count 0.05 0.00 - 1.00 x 1000/?L  Absolute Immature Granulocyte Count 0.03 0.00 - 0.30 x 1000/?L  Absolute nRBC 0.00 0.00 - 1.00 x 1000/?L Comprehensive metabolic panel Result Value Ref Range  Sodium 142 136 - 144 mmol/L  Potassium 4.4 3.3 - 5.3 mmol/L  Chloride 106 98 - 107 mmol/L  CO2 26 20 - 30 mmol/L  Anion Gap 10 7 - 17  Glucose 148 (H) 70 - 100 mg/dL  BUN 23 8 - 23 mg/dL  Creatinine 4.78 2.95 - 1.30 mg/dL  Calcium 9.1 8.8 - 62.1 mg/dL  BUN/Creatinine Ratio 30.8 (H) 8.0 - 23.0  Total Protein 7.6 6.6 - 8.7 g/dL  Albumin 4.0 3.6 - 4.9 g/dL  Total Bilirubin 0.2 <=6.5 mg/dL  Alkaline Phosphatase 76 9 - 122 U/L  Alanine Aminotransferase (ALT) 22 9 - 59 U/L  Aspartate Aminotransferase (AST) 23 10 - 35 U/L  Globulin 3.6 (H) 2.3 - 3.5 g/dL  A/G Ratio 1.1 1.0 - 2.2  AST/ALT Ratio 1.0 See Comment  eGFR (Creatinine) >  60 >=60 mL/min/1.68m2 T4, free Result Value Ref Range  Free T4 0.81 See Comment ng/dL IMAGING/PATH:[place] Chest Abdomen Pelvis w IV ContrastResult Date: 3/13/2023CT CHEST ABDOMEN PELVIS W IV CONTRAST  HISTORY: Melanoma, assess treatment response . Melanoma. COMPARISON: 01/16/2021, and 05/03/2021 Z6109 - Total number of known [place] scans and cardiac nuclear medicine studies within the past 12 months: 7 U0454 - RADIATION DOSE ACQUIRED DURING SCAN: 1226.15 mGy.cm. The [employer] strives for high quality imaging with the lowest possible radiation dose. For more information on medical radiation exposure please visit: [email or web address] . PROCEDURE: The patient was given 90 ml of Omnipaque 350 IV .[place] of the abdomen and pelvis was performed from lung apices to the symphysis pubis utilizing a slice thickness of 5mm. Coronal and sagittal reformatted images were performed on a dependent workstation. Adjustment of mA and/or kV was done according to patient size. FINDINGS: . There is dependent atelectasis. There are degenerative changes of the spine. . Bilateral groundglass density image 93 is unchanged. Right middle lobe groundglass densities image 210 are progressed since previous. Groundglass densities image 167 right middle lobe are similar to previous. Ground glass density right middle lobe image 242 progressed. Right lower lobe groundglass density image 243 is progressed since previous. Ground glass density image 203 is progressed since previous left upper lobe. The vascular structures of the chest are unremarkable. There is no significant mediastinal adenopathy.  There are nonobstructing bilateral renal stones. These are similar to previous. There are gallstones. There is an umbilical hernia with fat. The liver, spleen, adrenal glands, pancreas, kidneys, great vessels, gallbladder and bowel are otherwise unremarkable. Right hip replacement obscures detail within the pelvis. Diverticulosis is noted. Mild sigmoid wall thickening may suggest chronic diverticulitis. Colonoscopy recommended when clinically appropriate. The ureters, bladder, internal genitalia and bowel appear otherwise unremarkable. The appendix is unremarkable. There is no evidence of a loculated collection, free fluid or mass. There is no significant abdominal or pelvic adenopathy.  Diverticulitis. Sigmoid wall thickening may suggest chronic diverticulitis. Colonoscopy recommended when clinically appropriate. Progressed groundglass densities bilaterally concerning for progressed metastatic disease. Bilateral nonobstructing renal calculi. Gallstones. Coronal and sagittal reformatted images confirm the above findings. Reported and Signed by:  Anne Shutter, MD Outside Interpretation [place] Abdomen and/or PelvisResult Date: 3/20/2023OUTSIDE INTERPRETATION [place] ABDOMEN AND/OR PELVIS HISTORY/INDICATION: metastatic melanoma; surveillance scans show progression? vs inflammation This second interpretation is requested by the treating physician and the interpretation will directly contribute to the diagnosis and treatment of the patient. Second Interpretation Date: 07/03/2021 10:50 PM Original Acquisition Date: 06/27/2021 COMPARISON: [place] from 05/03/2021 TECHNIQUE: [place] of the abdomen and pelvis was performed after intravenous administration of contrast. FINDINGS: For findings above the diaphragms please see a separate Chest [place] report. The liver, spleen, pancreas, kidneys, and adrenal glands are unremarkable, except for nonobstructing bilateral renal stones. Cholelithiasis is noted. There is no bowel obstruction, ascites, or lymphadenopathy. There is colonic diverticulosis without definite evidence of acute diverticulitis. Small fat-containing umbilical hernia and bilateral inguinal hernias are noted noted. No aggressive osseous lesions are seen. Right hip prosthesis is noted. No evidence of metastatic disease in the abdomen/pelvis. Reported And Signed By: Morene Rankins, MD  [facility] Outside Interpretation [place] ChestResult Date: 3/22/2023OUTSIDE INTERPRETATION  CHEST HISTORY/INDICATION: metastatic melanoma scans show progression vs inflammation This second interpretation is requested by the treating physician and the interpretation will directly contribute to the diagnosis and treatment of the patient. Second Interpretation Date: 07/06/2021 4:12 PM Original Acquisition Date: 06/27/2021 COMPARISON:  Outside chest [place] dated 05/03/2021 TECHNIQUE: Contrast enhanced [place] scan of the chest was performed with 0.625 mm thick axial sections. Coronal and sagittal reformatted images are also provided. FINDINGS: Increasing ill-defined peribronchial and perifissural groundglass opacities with associated nodular densities present bilaterally, most pronounced in the right upper lobe, with some new areas present as well. These findings were new on the most recent prior chest [place] and have an appearance more suggestive of an infectious or inflammatory etiology (including drug toxicity/ sarcoid-like drug reaction in the appropriate setting) rather than metastases. Calcified granulomas present. An azygos lobe is incidentally noted, a normal variant. There is mild bronchiectasis. Subtle interstitial lung abnormality with bibasilar reticular markings and suggestion of traction bronchiolectasis in the inferior lingula is similar to the prior exam. Central airways are patent. Prominent extrapleural fat bilaterally. No pleural effusion or pneumothorax. Stable subcentimeter hilar and mediastinal lymph nodes. The heart is normal in size. No pericardial effusion. Severe triple-vessel coronary artery calcification, coronary stents present as well. Calcification also present in the in the aortic valve and aorta. Concurrent [place] abdomen and pelvis is dictated separately. Surgical clips in the right axilla from lymph node dissection and surgical clip in the right lower back. Asymmetric gynecomastia on the right. Degenerative changes at the shoulders. Mild scoliosis and degenerative spine changes present. Hemangioma in T1 vertebral body. No aggressive bone lesions.  Increasing peribronchial abnormalities with an appearance more suggestive of an inflammatory (such as drug toxicity in the appropriate setting) or less likely infectious etiology rather than metastases. Consider further evaluation with bronchoscopy. Reported And Signed By: Eunice Blase, MD  [facility]  07/22/20: SOFT TISSUE, RIGHT UPPER BACK, EXCISION: ? ? ? ?- METASTATIC MELANOMA INVOLVING A LYMPH NODE (1/1) ? ? ? ? ? - SIZE OF THE LARGEST TUMOR DEPOSIT: 11 MM ? ? ? ? ? - SIZE OF THE LYMPH NODE: 1.6 CM WLE and SLN 03/03/19:LYMPH NODES, RIGHT AXILLARY LEVELS 1, 2 AND 3, LYMPH NODE DISSECTION: ?  ? ? - ?THREE OF THIRTY-FIVE LYMPH NODES, POSITIVE FOR MELANOMA (3 OF 35) WITH LARGEST INVOLVED LYMPH NODE MEASURING 4.5 X 3 CM ? ? ?- LYMPHATIC INVASION IDENTIFIED ? ? ?- NO EXTRANODAL EXTENSION IDENTIFIED 1. ?SKIN, BACK, RIGHT UPPER, EXCISION: ? ? ? ?- MALIGNANT MELANOMA, SEE NOTE AND SYNOPTIC SUMMARY Note: Sections show a tumor composed of S100 positive epithelioid cells within the dermis and subcutaneous tissue. Cytokeratin AE1/AE3 and desmin are negative. A junctional component is not identified. The findings would be compatible with a primary dermal melanoma or a metastatic lesion. Clinical correlation is suggested. 2. ?SKIN, BACK, EXCISION: ? ? ? ?- BENIGN FIBROADIPOSE TISSUE AND SKELETAL MUSCLE SYNOPTIC SUMMARY MELANOMA OF THE SKIN Tumor Site: ? ? Back Laterality: ? ? Right Procedure: ? ? Primary excision Maximum Tumor Thickness (Depth): ? ? 30 mm Ulceration: ? ? Not identified Mitotic Rate (Mitoses/mm2): ? ? 2 Anatomic Level: ? ? V (Melanoma invades subcutis) Growth Phase: ? ? Vertical Histologic Type: ? ? Melanoma, NOS Tumor-Infiltrating Lymphocytes: ? ? Present, non-brisk Tumor Regression: ? ? Not identifed Lymphovascular Invasion: ? ? Present Microsatellitosis: ? ? Present Margins ? ? ?Peripheral Margins: ? ? Uninvolved Deep Margin: ? ? Uninvolved Stage (AJCC 8th Ed): ? ? pT4a Nx Base of the tongue and vallecula random biopsies 02/20/19: 1. ?TONGUE, LEFT, BASE, BIOPSY: ? ? ? ?- LYMPHOID TISSUE HYPERPLASIA ? ? ?- NEGATIVE FOR MALIGNANCY 2. ?THROAT, VALLECULA, BIOPSY: ? ?  ? - LYMPHOID TISSUE HYPERPLASIA AND FOCAL CHRONIC  INFLAMMATION. SEE NOTE. ? ? ?- NEGATIVE FOR MALIGNANCY ? ? ? ? ? Note: Immunostain of Sox-10 to investigate focal pigment is negative, supporting above interpretation. 3. ?THROAT, LEFT VALLECULA, BIOPSY: ? ? ? ?- LYMPHOID TISSUE HYPERPLASIA ? ? ?- NEGATIVE FOR MALIGNANCY 4. ?THROAT, RIGHT VALLECULA, BIOPSY: ? ? ? ? ? ? - LYMPHOID TISSUE HYPERPLASIA AND FOCAL CHRONIC INFLAMMATION ? ? ?- NEGATIVE FOR MALIGNANCY 5. ?TONGUE, RIGHT BASE, BIOPSY: ? ? ? ? ? ? - LYMPHOID TISSUE HYPERPLASIA AND FOCAL CHRONIC INFLAMMATION ? ? ?- NEGATIVE FOR MALIGNANCY Pathology (reviewed at [facility]), collected 01/07/19:DIAGNOSIS: ? 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 Clinical pathological correlation is recommended. MICROSCOPIC DESCRIPTION: There are atypical cells in a nodule in the dermis. The cells are positive with SOX-10 and by report are negative with LCA, Kappa, Lambda, and a cytokeratin. The cells are also positive with S-100 by report.ASSESSMENT/PLAN:[name] is a 76 y.o. 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  Given the thickness of his primary melanoma and microsatellitosis, we felt that his melanoma-specific survival is closer to a stage IIID:  60% over 10 years with stage IIIC disease versus 24% with stage IIID.  He opted to proceed with the Moderna clinical trial (?A Phase 2 Randomized Study of Adjuvant Immunotherapy with the Personalized Cancer Vaccine [phone] and Pembrolizumab Versus Pembrolizumab Alone After Complete Resection of High-Risk Melanoma?) and completed 1 year of adjuvant therapy on trial without issues.  He developed an enlarged 1 cm SQ nodule overlying the right scapula noted on scans 07/05/20 s/p excision showing a 1.6 cm LN containing a 11 mm deposit of melanoma.MRI brain 09/18/20 showed a 8 mm left hippocampus lesion and a 6 mm lesion in the left mesial temporal lobe concerning for brain mets.  Reviewed the case with Dr. Winfred Leeds; lesions are too small and ill-defined to biopsy and would hold off on GKRS for now.  Given high suspicion for metastatic disease, recurrence of his disease recently, and fairly good performance status, would opt to be aggressive.  I reviewed with Jason Rowland that ideally, we would want to get a biopsy to establish stage IV disease, but given that we know his disease is aggressive and presented again shortly after discontinuation of adjuvant anti-PD-1, we can presume that this is metastatic disease and start a new line of systemic therapy.  Discussed the available brain met clinical trials, but he wants to move to [facility] and would like to pursue standard of care. Discussed data from CheckMate 204 utilizing ipi 3 + nivo 1 in brain metastasis which demonstrated similar intracranial and extracranial efficacy.  Jason Rowland agreed to proceed; thus far tx has been complicated by G1-2 diarrhea which is well controlled with imodium/lomotil. [place] CAP 05/03/21 shows multiple non-specific areas of diffuse enhancement in the b/l lungs which requires f/u.- labs reviewed, BUN 23; creat .90 and improved will follow, he has been increasing oral water intake, glucose 148 . Cbc wnl , TSH 7.660. Jason Rowland has been taking his synthroid on a full stomach at night with his other medications. Re educated that synthroid should be taken alone with no other medications on empty stomach, or 2 hours  after eating a meal. - will hold on tx today due to pneumonitis  - [place] cap from 07/04/21 was sent over to [facility] for second interpretation and presented at tumor board.Increasing peribronchial abnormalities with an appearance more suggestive  Pneumonitis. Repeat [place] chest needs to be done in 6 weeks to eval any worsening findings .There is colonic diverticulosis without definite evidence of acute diverticulitis. - Diarrhea:  To monitor for now as seems to be a food-related isolated event limited to the last 24 hours. Any denies any blood in his stool or daily diarrhea. He will need to see GI and get colonoscopy, sigmoidoscopy to assess ongoing chronic sxs - Ongoing intermittent blurry vision f/u with his eye doctor on 06/20/2021;new eye drops rxed, was told he had macular degeneration. No evidence of uveitis.- MRI brain: rescheduled for 07/12/21 [place] location. - Neuro appt set for  07/14/21. Jason Rowland prefers [place] location and does not want to to drive into [facility]. - Care will be transferred to Dr Bayard Males of [facility]. [place] [phone] office.[phone] - fax . Another message left for the office today to call back to set up an appt for andy- routine f/u with dermatologist for FBSEs: every 4 months. Last derm appt was - RTC as needed. Jason Rowland will call and let us know when he will be back in [place] to set up an interim visit. - He knows to call our office for any questions or concerns while in [place] 38 min spent in chart review and direct care on the day of the encounter.Jason Bumpers APRN FNP [facility] Discussed the case with Dr. Laveda Norman and Dr. Laveda Norman will not bill for services related to this encounter

## 2021-07-12 ENCOUNTER — Inpatient Hospital Stay: Admit: 2021-07-12 | Discharge: 2021-07-12 | Payer: BLUE CROSS/BLUE SHIELD

## 2021-07-12 DIAGNOSIS — C7931 Secondary malignant neoplasm of brain: Secondary | ICD-10-CM

## 2021-07-12 DIAGNOSIS — C4359 Malignant melanoma of other part of trunk: Secondary | ICD-10-CM

## 2021-07-12 MED ORDER — GADOTERATE MEGLUMINE 0.5 MMOL/ML (376.9 MG/ML) INTRAVENOUS SOLUTION
0.5 mmol/mL (376.9 mg/mL) | Freq: Once | INTRAVENOUS | Status: CP | PRN
Start: 2021-07-12 — End: ?
  Administered 2021-07-12: 13:00:00 0.5 mL via INTRAVENOUS

## 2021-07-12 MED ORDER — SODIUM CHLORIDE 0.9 % BOLUS (NEW BAG)
0.9 % | Freq: Once | INTRAVENOUS | Status: CP | PRN
Start: 2021-07-12 — End: ?
  Administered 2021-07-12: 13:00:00 0.9 mL/h via INTRAVENOUS

## 2021-07-14 ENCOUNTER — Ambulatory Visit: Admit: 2021-07-14 | Payer: BLUE CROSS/BLUE SHIELD | Attending: Neurology

## 2021-07-14 ENCOUNTER — Telehealth: Admit: 2021-07-14 | Payer: PRIVATE HEALTH INSURANCE

## 2021-07-14 DIAGNOSIS — I639 Cerebral infarction, unspecified: Secondary | ICD-10-CM

## 2021-07-14 NOTE — Telephone Encounter
I called [place] [facility] dr Bayard Males. Asked to for the new patient coordinator to call me back re: new patient appt. I provided my cell Electronically Signed by Bo Mcclintock, APRN, July 14, 2021

## 2021-07-14 NOTE — Patient Instructions
Clinic phone number [phone] to set up your follow up appointment  We will send a heart monitor to your address in [place] We will contact your MD in [place] to arrange an echocardiogram and carotid ultrasoundKeep taking your aspirin and statin

## 2021-07-15 ENCOUNTER — Telehealth: Admit: 2021-07-15 | Payer: PRIVATE HEALTH INSURANCE

## 2021-07-15 NOTE — Telephone Encounter
Called Einar Crow review that I have not yet heard from Dr  Jack Quarto office in [place]. I called the office today and left my cell phone. I will call them next week. He is driving to [place] today. Electronically Signed by Bo Mcclintock, APRN, July 15, 2021

## 2021-07-15 NOTE — Progress Notes
I saw Jason Rowland in consultation in [facility]. The patient was referred by Era Bumpers, APRN. No chief complaint on file.RFC: Abnormal MRIHPI: 76 y.o. 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 metastatic lesion. His follow-up MRI in 06/2021 showed evolving stroke with resolution of enhancement. Interim History:The patient presented to clinic 07/14/2021.He reports he is moving to [place] permanently in the next week. He will continue to return to [place] quarterly for his cancer immunotherapy. He states he was called 6 weeks ago and was told not to drive as he may be having a stroke. He does not recall experiencing any symptoms. Denies any focal deficits such as monocular vision loss, diplopia, facial droop, slurred speech, word finding difficulty, hemibody weakness or numbness, gait instability or difficulty with coordination. He does not have a history of headaches. He has never had a DVT or a PE. He did not receive radiation therapy for his cancer at any point. He is currently receiving immunotherapy which has been going well.He states his A1c was checked as recently as two months ago ordered by Dr. Tobie Lords but the results do not appear to be available to [place].He states he may snore at night, has not been evaluated for OSA.He takes a daily baby ASA, starting approximately 4 years ago preemptively on his own. He also takes a statin.He reports when he is in [place] he will be living with his daughter who is a nutritionists and very helpful in helping him manage his weight. He also exercises more and is confident his upcoming move will be beneficial for his overall health. Referral to a physician [facility] cancer specialist) is being arranged for him by the oncology team.His new address in [place] will be [address], [place], [place] [address]. Past Medical History: Diagnosis Date ? Diabetes mellitus (HC Code)  ? High cholesterol  ? Kidney stones   40 years ago ? Malignant melanoma metastatic to brain [facility] CODE) (HC Code) 09/30/2020 ? 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 acetaminophen (TYLENOL) 325 mg tablet Take 3 tablets (975 mg total) by mouth every 6 (six) hours as needed (mild to moderate pain). (Patient not taking: Reported on 07/08/2021) 30 tablet 11 ? ALPRAZolam (XANAX) 0.5 mg tablet Take 1 tablet (0.5 mg total) by mouth as needed. (Patient not taking: Reported on 07/08/2021)   ? aspirin 81 MG EC tablet Take 1 tablet (81 mg total) by mouth nightly.   ? diphenoxylate-atropine (LOMOTIL) 2.5-0.025 mg per tablet Take 1 tablet by mouth 4 (four) times daily as needed for diarrhea. 60 tablet 0 ? FREESTYLE LIBRE 14 DAY sensor kit 1 each by Other route every 14 (fourteen) days.   ? glipiZIDE (GLUCOTROL XL) 10 MG 24 hr tablet Take 1 tablet (10 mg total) by mouth nightly.   ? ibuprofen (ADVIL,MOTRIN) 200 mg tablet Take 2 tablets (400 mg total) by mouth every 6 (six) hours as needed (alternate with tylenol for mild to moderate pain). (Patient not taking: Reported on 07/08/2021) 60 tablet 2 ? indomethacin (INDOCIN) 25 mg capsule 3 (three) times daily. (Patient not taking: Reported on 06/10/2021)   ? levothyroxine (SYNTHROID, LEVOTHROID)  50 MCG tablet Take 1 tablet (50 mcg total) by mouth daily.   ? loperamide (IMODIUM A-D) 2 mg tablet Take 1 tablet (2 mg total) by mouth See Admin Instructions. 4mg  ( 2 tablets ) in the morning. And then with each bm take an additional one table . Max dose per day 16mg  90 tablet 3 ? olopatadine (PATANOL) 0.1 % ophthalmic solution as needed (takes for eye irritation as needed). (Patient not taking: Reported on 07/08/2021)   ? ondansetron (ZOFRAN-ODT) 8 mg disintegrating tablet Place 1 tablet (8 mg total) onto the tongue every 8 (eight) hours as needed for nausea. (Patient not taking: Reported on 07/08/2021) 90 tablet 0 ? predniSONE (DELTASONE) 20 mg tablet Take 3 tablets (60 mg total) by mouth daily. Take with food. Please speak to office before starting this medication (Patient not taking: Reported on 07/08/2021) 90 tablet 0 ? simvastatin (ZOCOR) 20 MG tablet Take 1 tablet (20 mg total) by mouth nightly.   ? TOUJEO SOLOSTAR U-300 300 unit/mL (1.5 mL) pen Inject 70 Units under the skin nightly. 4.5 mL  ? triamcinolone (KENALOG) 0.1 % ointment Apply topically 2 (two) times daily as needed. To areas of rash. 80 g 1 ? vit A/vit C/vit E/zinc/copper (PRESERVISION AREDS ORAL) Take by mouth daily.   ? zolpidem (AMBIEN) 10 mg tablet nightly..   No current facility-administered medications on file prior to visit. Social History Tobacco Use ? Smoking status: Never ? Smokeless tobacco: Never Vaping Use ? Vaping Use: Never used Substance Use Topics ? Alcohol use: Not Currently ? Drug use: No Family History Problem Relation Age of Onset ? Heart disease Mother  ? Bladder cancer Father 62 ? Kidney cancer Sister  ? Leukemia Brother 60 ? No Known Problems Daughter  ? No Known Problems Son  REVIEW OF SYSTEMS: CONSTITUTIONAL: As per HPI. HEENT: Eyes: No diplopia or blurred vision. ENT: No earache, sore throat or runny nose. CARDIOVASCULAR: No pressure, squeezing, strangling, tightness, heaviness or aching about the chest, neck, axilla or epigastrium. RESPIRATORY: No cough, shortness of breath, PND or orthopnea. GASTROINTESTINAL: No nausea, vomiting or diarrhea. GENITOURINARY: No dysuria, frequency or urgency. MUSCULOSKELETAL: As per HPI. SKIN: No change in skin, hair or nails. NEUROLOGIC: No paresthesias, fasciculations, seizures or weakness. PSYCHIATRIC: No disorder of thought or mood. ENDOCRINE: No heat or cold intolerance, polyuria or polydipsia. HEMATOLOGICAL: No easy bruising or bleeding. PHYSICAL EXAM:There were no vitals taken for this visit.Neurologic examination:GEN- well appearing, no apparent distressCV- Regular rate and rhythm, no murmurs/rubs/gallops. No carotid or vertebral bruits.RESP- CTABAbd- non-tender, non-distended, bowel sounds present.Skin- no rashesMental status: Level of alertness: Awake, alertOrientation: oriented to person, place, month, year, and situation.Attention: Able to spell WORLD forwards and backwards.Affect: NormalSpeech: Fluent. No errors.Naming: IntactRepetition: IntactReading: IntactWriting: IntactCommands: Follows simple commands. Follows midline commands. Follows multi-step commands.Memory: Registers 3/3 items, recalls 3/3 items after 5 minutes.Cranial nerves- CN I: Not assessed.CN II: Visual fields intact to confrontation. Normal fundi. No papilledema. No APD.CN III, IV, VI: Pupils are equal, round, and reactive to light 4 mm--> 2 mm bilaterally. Eye movements are normal. No ptosis. No nystagmus.CN V: Facial sensation intact to light touch and sharp sensation bilaterally. CN VII: Facial movements are symmetric and normal bilaterally. CN VIII: Hearing is normal bilaterally. CN IX, X: Palate and uvula are midline and normal. Normal palate rise.CN XI: Sternocleidomastoid and trapezius muscles full strength bilaterally.CN XII: Tongue is midline and normal.Motor- Normal bulk and tone. No drift. No abnormal movements. Right Left Deltoid 5/5 5/5 Biceps 5/5 5/5  Triceps 5/5 5/5 Wrist flexion 5/5 5/5 Wrist extension 5/5 5/5 Grip 5/5 5/5 Iliopsoas 5/5 5/5 Quadriceps 5/5 5/5 Hamstrings 5/5 5/5 Dorsiflexion 5/5 5/5 Plantarflexion 5/5 5/5 Reflexes-	 Right Left Biceps 1 1 Triceps 1 1 Brachioradialis 1 1 Patella 1 1 Achilles 0 0 Babinski testing downgoing downgoing Hoffman absent absent Clonus absent absent Sensation-Light touch: normalSharp: normalVibration: normalProprioception: normalCerebellar-Finger-nose-finger testing normal bilaterally.Heel-to-shin testing normal bilaterally.Rapid alternating movements normal bilaterally.Gait-Narrow-based. Steady.Romberg: NegativeDATA:02/02/2023MRI BrainPreviously identified left temporal lobe lesions seen in June 2022 now only visible as a tiny focus of enhancement along left temporal horn, similar to October 2022.Focus of enhancing signal now seen in left corona radiata/frontal white matter with slight restricted diffusion, suspected to reflect evolving subacute infarct. Continued follow-up will allow for assessment of full evolution.?Reported and Signed by:  Jolyn Lent, MD03/28/2023MRI BrainOverall there is evidence of positive treatment response.1. Previously seen tiny focus of enhancement in the left temporal lobe adjacent to the temporal horn appears significantly less conspicuous in comparison to prior study. No convincing residual enhancement is identified. No new intracranial enhancing lesion.2. 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 No evidence of new intracranial infarction.3. Left mastoid air cell effusion.I independently reviewed his MRI brain as demonstrated below. I also reviewed post contrast intracranial vessel imaging, which did not demonstrate any significant intracranial disease. 02/2019 Hemoglobin A1c 10.5IMPRESSION:The pt is a 76 y.o. 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 Fortunately he had no symptoms and remains neurologically intact. The etiology could be small vessel ischemic disease given his history of diabetes, however, it appears to be on slightly larger size in terms of area and therefore we need to consider an embolic source. Hypercoagulability from his metastatic melanoma is also possible, however he has never had a prior history of DVT or other thrombus. Vessel imaging of the brain was unremarkable, and requires extended cardiac monitoring to rule out Afib. He also needs an echocardiogram and a carotid ultrasound to continue the work up, however he is moving to [place] this week. I can arrange his cardiac monitoring and can mail it to his home in [place], but we will need to coordinate his TTE and carotid imaging in [place]. Berthel plans to return to [place] for immunotherapy for his melanoma and I will follow-up with him then as well.I will try to get an updated A1c and lipid profile from his PCP.Continue ASA and statin, goal LDL < 70. BP target < 120/80 for optimal risk reduction. We reviewed acute stroke symptoms for which to seek medical attention immediately.I will see Kaegan Hettich back for follow up in 6 months. Thank you for allowing me to participate in your care.Scribed for Donnel Saxon, MD by Sande Brothers, medical scribe March 30, 2023The documentation recorded by the scribe accurately reflects the services I personally performed and the decisions made by me. I reviewed and confirmed all material entered and/or pre-charted by the scribe. I spent 60 minutes dedicated to the care of the patient, of which 40 minutes were spent reviewing records and imaging, counseling and coordination of care. Specifically we discussed my impression on the etiology of the patient's symptoms, interpretation of imaging and laboratory results, our secondary prevention plan, and acute stroke symptoms for which to seek medical attention. Donnel Saxon, MDYale Comprehensive Stroke CenterThis note was created using dictation software. Efforts were made by me to ensure accuracy, however some errors may be present due to  limitations of this technology and occasionally words are not transcribed as intended.

## 2021-07-18 ENCOUNTER — Ambulatory Visit: Admit: 2021-07-18 | Payer: BLUE CROSS/BLUE SHIELD

## 2021-07-18 ENCOUNTER — Telehealth: Admit: 2021-07-18 | Payer: PRIVATE HEALTH INSURANCE

## 2021-07-18 ENCOUNTER — Telehealth: Admit: 2021-07-18 | Payer: PRIVATE HEALTH INSURANCE | Attending: Neurology

## 2021-07-18 DIAGNOSIS — I639 Cerebral infarction, unspecified: Secondary | ICD-10-CM

## 2021-07-18 NOTE — Telephone Encounter
Spoke to the office manager, Nedra Hai from the [employer] office of dr Ok Edwards extension to him is [phone] electronically faxed over the office note and all the recent images.This is my second attempt doing so. It was never received when I first sent it over the beginning of March.I will contact Nedra Hai to ensure it was received. I also provided Dr Carmela Rima contact information so that a verbal hand off could be provided. Electronically Signed by Bo Mcclintock, APRN, July 18, 2021

## 2021-07-18 NOTE — Telephone Encounter
Patient is now enrolled in [employer] cardiac monitoring. Monitor to be sent to home address.

## 2021-07-18 NOTE — Telephone Encounter
Correction to contact lee the [facility] ext is [phone] Electronically Signed by Bo Mcclintock, APRN, July 18, 2021

## 2021-08-04 ENCOUNTER — Telehealth: Admit: 2021-08-04 | Payer: PRIVATE HEALTH INSURANCE

## 2021-08-04 NOTE — Telephone Encounter
Phone call from the [facility] cancer center asking for the treatment flow sheet on Jason Rowland. They would like to know current and past treatments

## 2021-08-05 ENCOUNTER — Ambulatory Visit: Admit: 2021-08-05 | Payer: BLUE CROSS/BLUE SHIELD

## 2021-08-08 ENCOUNTER — Telehealth: Admit: 2021-08-08 | Payer: PRIVATE HEALTH INSURANCE

## 2021-08-08 NOTE — Telephone Encounter
Taiquan ir requesting to speak to [place] regarding som disc's Please call back

## 2021-08-08 NOTE — Telephone Encounter
Patient needed medical records number- provided. Electronically Signed by Karle Plumber, RN, August 08, 2021

## 2021-09-28 IMAGING — MR MRI BRAIN W/WO CONTRAST
2 of 12 series · 5 of 48 positions shown · IV contrast (gadavist)
Comparison: None

________________________________________________________________________________________________ 
MRI BRAIN W/WO CONTRAST, 09/28/2021 [DATE]: 
CLINICAL INDICATION: Malignant Melanoma Of Skin, Unspecified. Assess treatment 
response.
TECHNIQUE: Multiplanar, multiecho position MR images of the brain were performed 
without and with 11.5 mL of Gadavist were injected intravenously by hand. 3.5 mL 
of Gadavist were discarded.  Patient was scanned on a 1.5T magnet.

[Series 802: T1 post-contrast · coronal · 1.0mm · 0.24mm/px · 3 of 180 slices shown (1 of 2)]
[im 20/180]
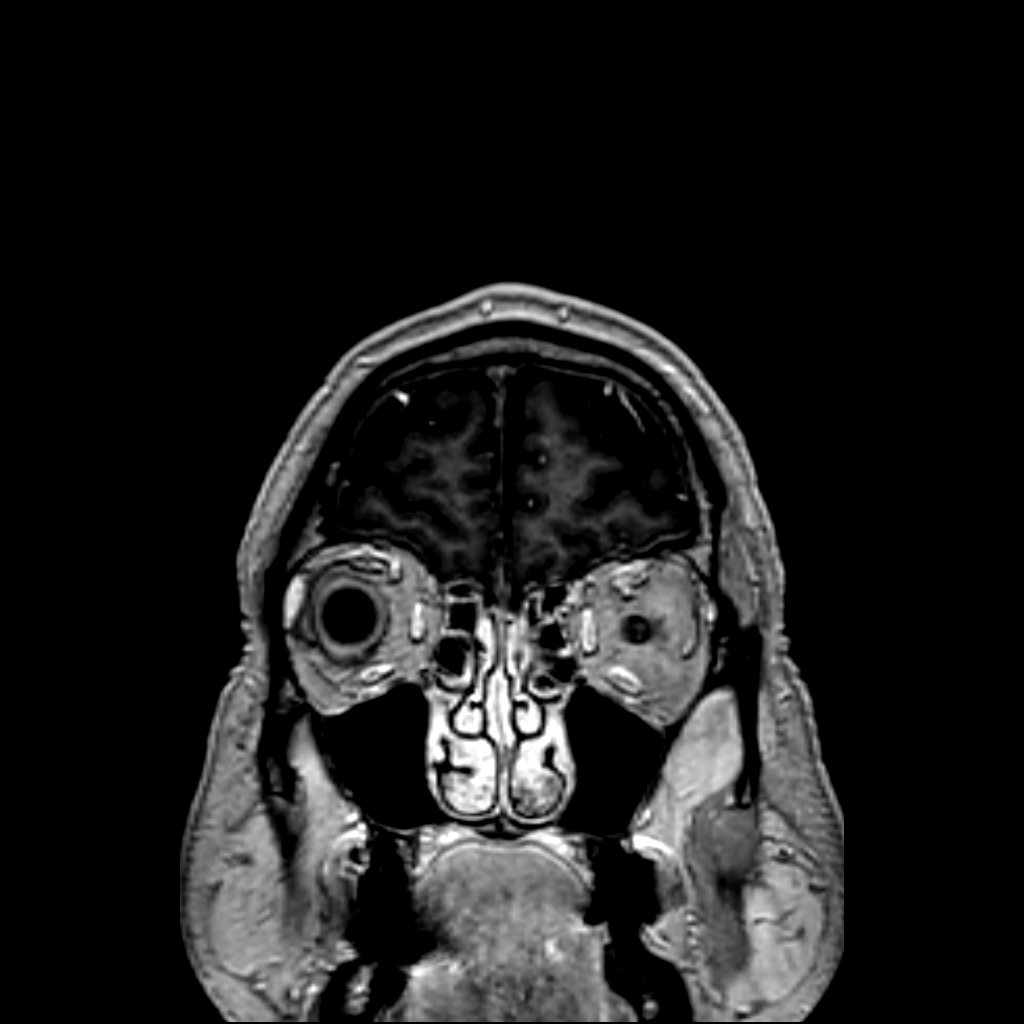
[im 100/180]
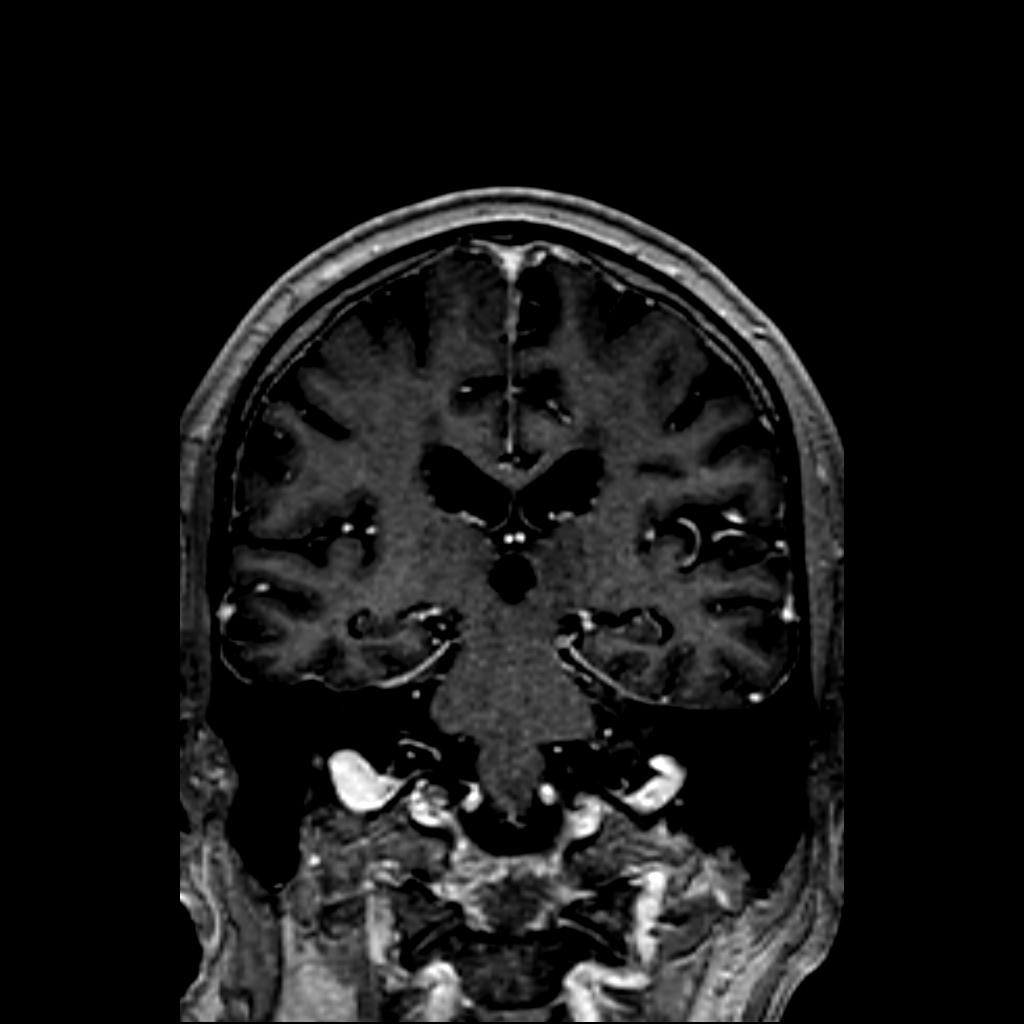
[im 160/180]
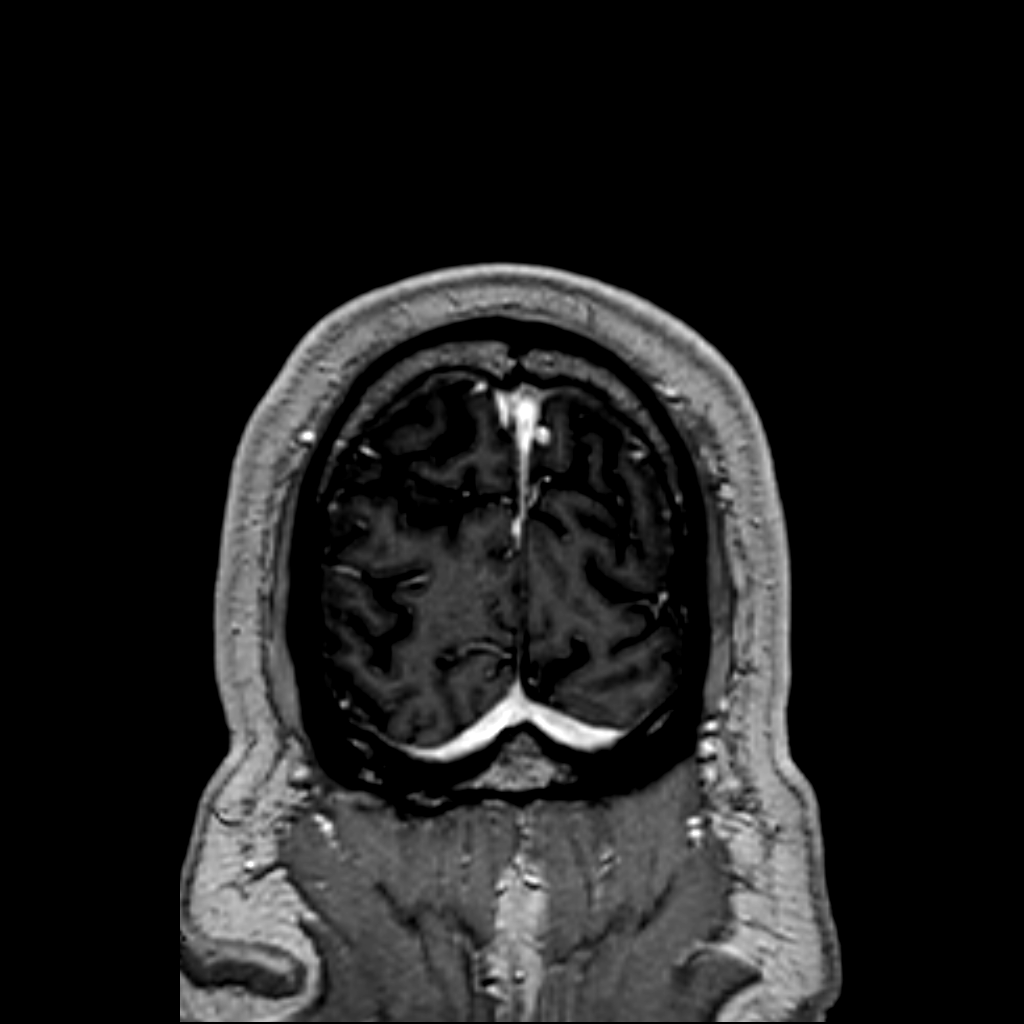

[Series 803: T1 post-contrast · axial · 1.0mm · 0.24mm/px · z∈[-40,+19]mm · 2 of 161 slices shown (2 of 2)]
[im 21/161]
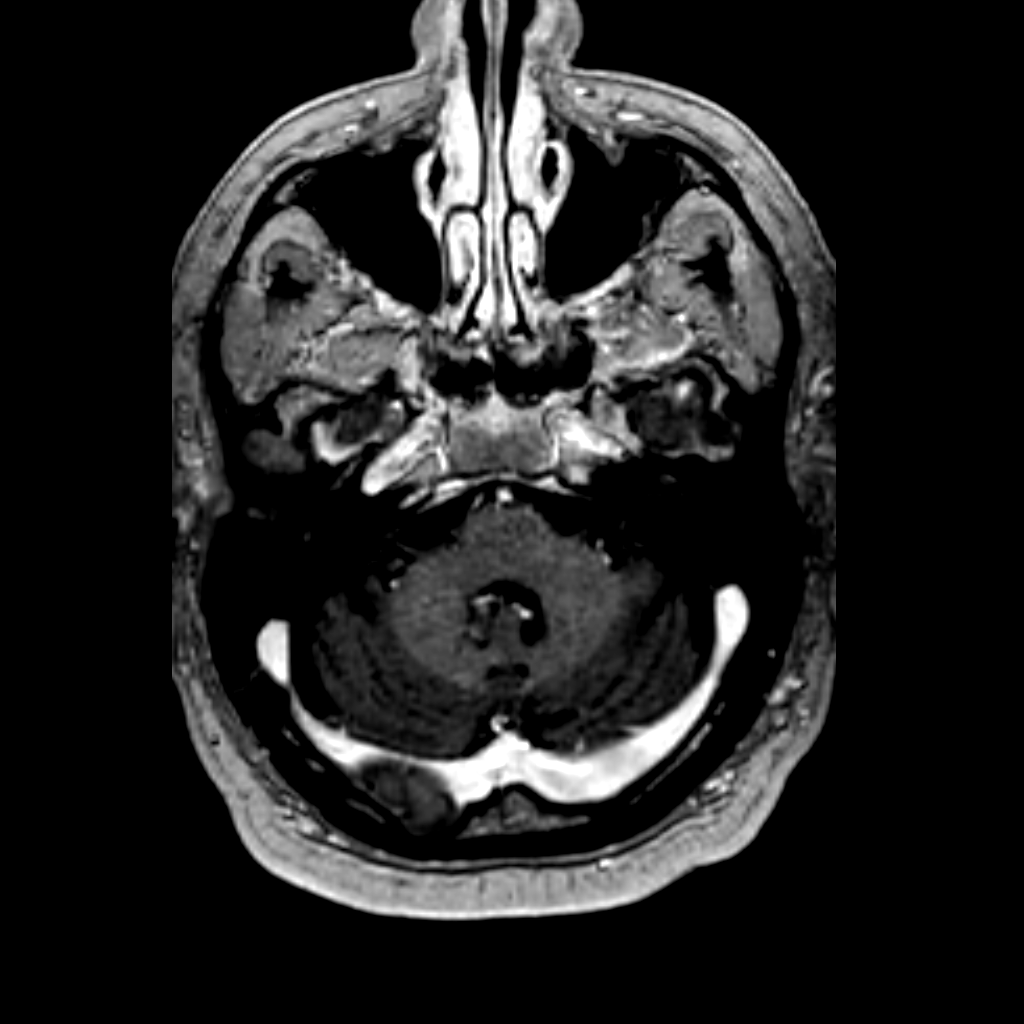
[im 81/161]
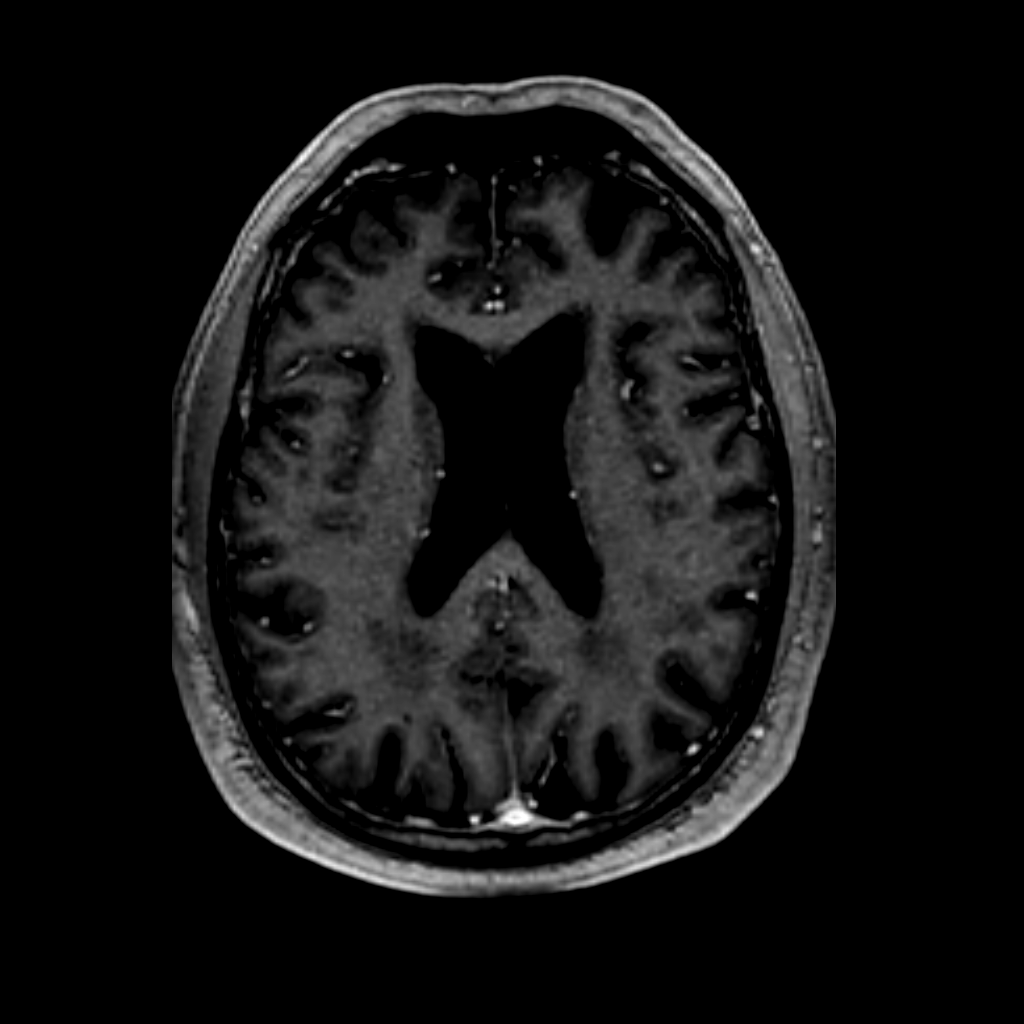

[5 of 48 positions shown; findings below may reference images not displayed]

FINDINGS: -------------------------------------------------------------------------------- 
------------------------- 
INTRACRANIAL: 
No acute ischemia. No abnormal foci of susceptibility artifact in the brain. 
Moderate to advanced degree of nonspecific subcortical, deep white matter, and 
periventricular T-2 FLAIR hyperintensities likely reflective of chronic small 
vessel vascular change. Patency of the intracranial vascular flow voids.  No 
acute intracranial hemorrhage, mass effect, midline shift. No large sellar mass. 
No hydrocephalus. Cerebral volume is age appropriate.  No pathologic contrast 
enhancement.  
-------------------------------------------------------------------------------- 
----------------------- 
OTHER: 
ORBITS/SINUSES/T-BONES:  Bilateral pseudophakia.  Mastoid air cells and middle 
ear cavities are grossly clear.  Visualized paranasal sinuses are clear. 
MARROW SIGNAL/SOFT TISSUES: No focal suspect signal abnormality. There is a 
focus of T1 and T2 hypointensity with apparent susceptibility involving the soft 
tissues at the midline frontal vertex which could reflect a small foreign body 
in this location. Correlate with clinical evaluation of this region. 
-------------------------------------------------------------------------------- 
-------------------
IMPRESSION: No acute intrarenal process. No findings suggestive of intracranial metastatic 
disease. Chronic appearing parenchymal changes as above. 
Focus of abnormal hypointensity with apparent susceptibility in the soft tissues 
at the midline frontal vertex which could reflect small foreign body. Correlate 
clinically.

## 2021-10-03 ENCOUNTER — Encounter: Admit: 2021-10-03 | Payer: PRIVATE HEALTH INSURANCE | Attending: Vascular and Interventional Radiology

## 2021-10-03 IMAGING — CT CT CHEST/ABDOMEN/PELVIS WITH CONTRAST
2 of 7 series · 15 of 46 positions shown, 17 images · IV contrast (APPLIED)
Comparison: 06/27/2021.

________________________________________________________________________________________________ 
CT CHEST/ABDOMEN/PELVIS WITH CONTRAST, 10/03/2021 [DATE]: 
CLINICAL INDICATION: Cerebrovascular Disease, Unspecified, Malignant Melanoma Of 
Skin, Unspecified, Type 2 Diabetes Mellitus With Diabetic Polyneuropathy. 
A search for DICOM formatted images was conducted for prior CT imaging studies 
completed at a non-affiliated media free facility.
TECHNIQUE: The chest, abdomen and pelvis were scanned from base of neck through 
the pubic rami with 100 mL of Isovue 300 injected intravenously on a high 
resolution low dose CT scanner. Routine MPR and MIP 3D renderings were 
reconstructed on an independent workstation with concurrent physician 
supervision.

[Series 7: coronal · coronal · 0.62mm/px · 3 of 160 slices shown]
[im 40/160  soft-tissue]
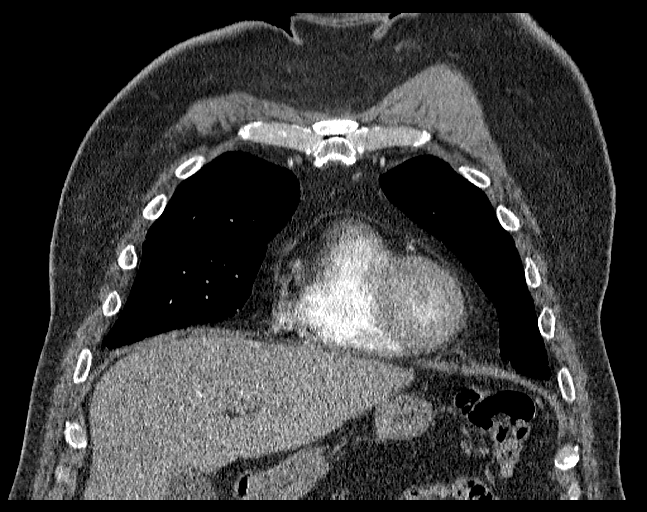
[im 80/160  soft-tissue]
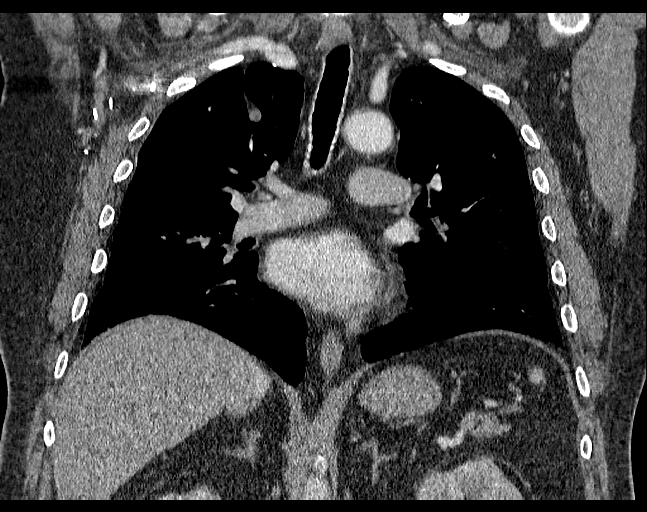
[im 120/160  soft-tissue]
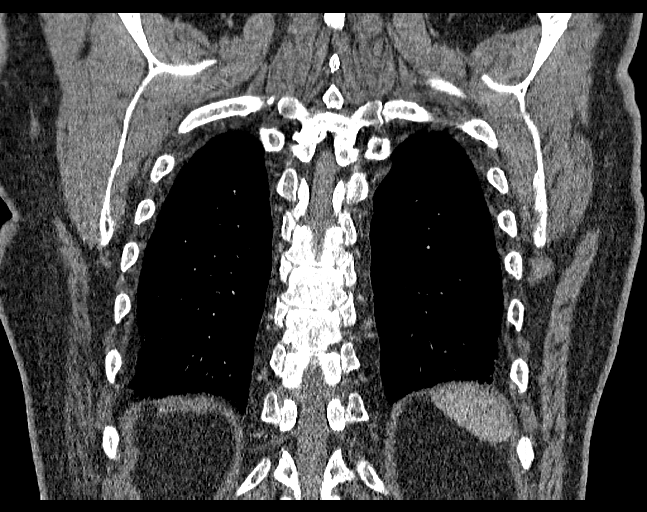

[Series 9: portal 3.0 i41s 2 · axial · portal-venous · 0.87mm/px · z∈[-763,-208]mm · 12 of 213 slices shown, 14 images]
[im 14/213  soft-tissue]
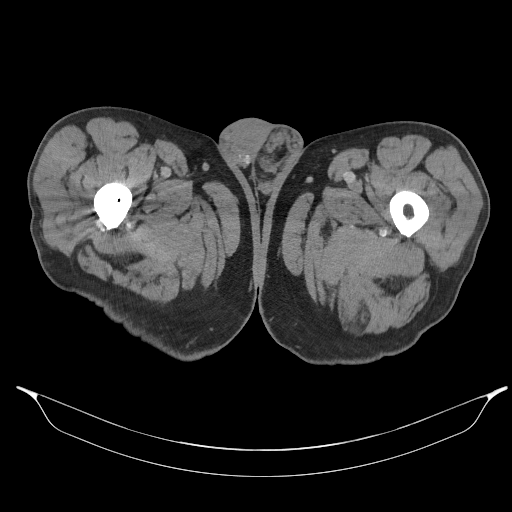
[im 14/213  bone]
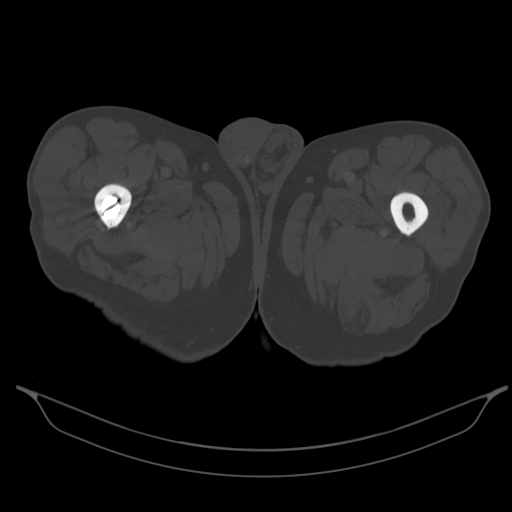
[im 27/213  soft-tissue]
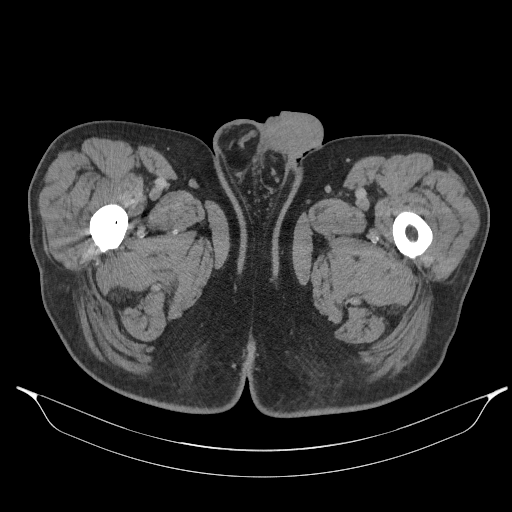
[im 54/213  soft-tissue]
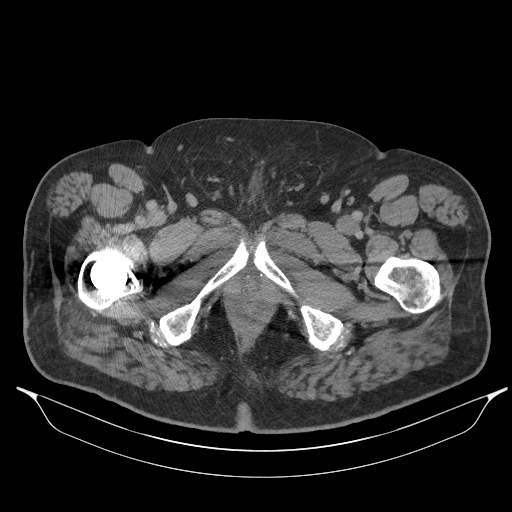
[im 67/213  soft-tissue]
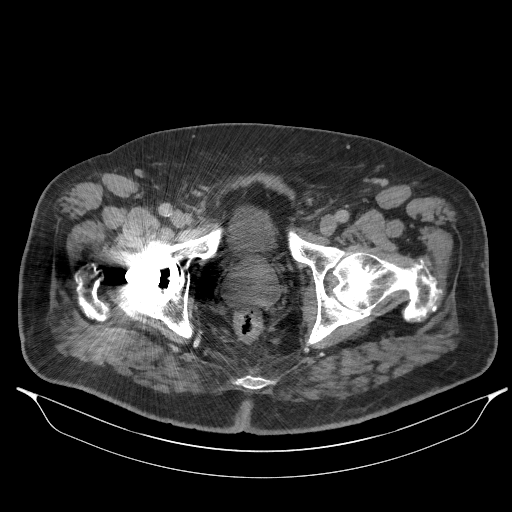
[im 80/213  soft-tissue]
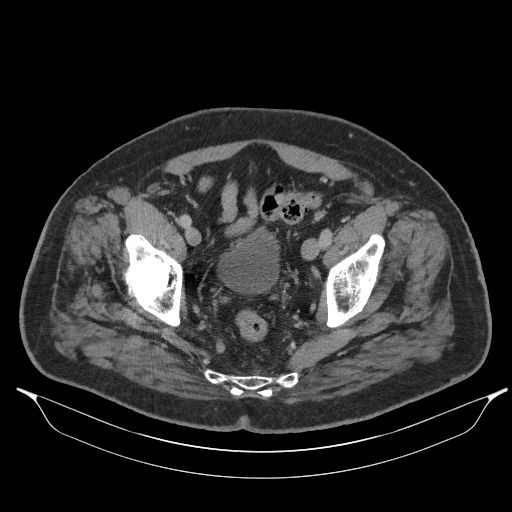
[im 93/213  soft-tissue]
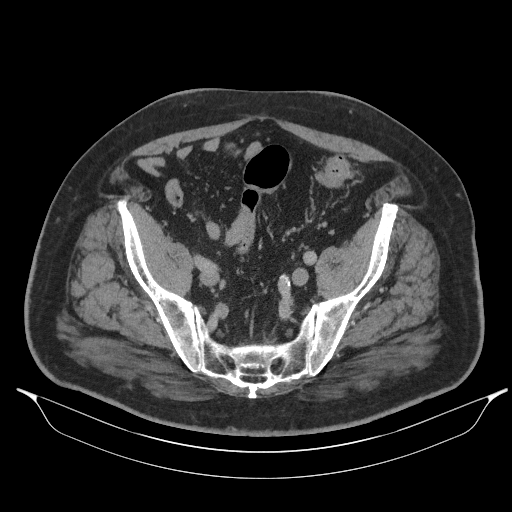
[im 120/213  soft-tissue]
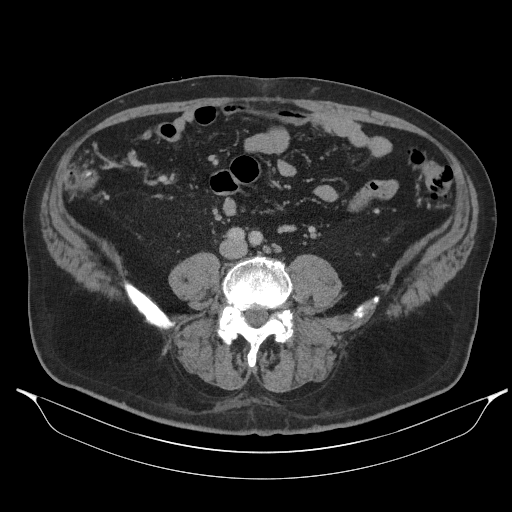
[im 133/213  soft-tissue]
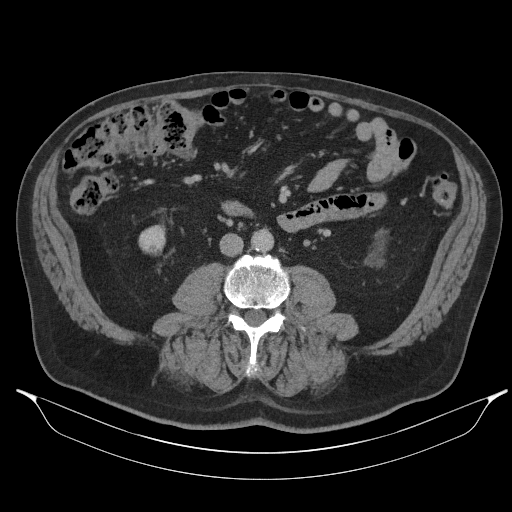
[im 146/213  soft-tissue]
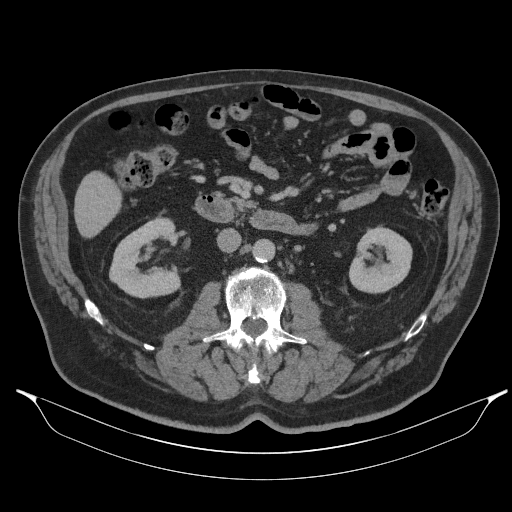
[im 146/213  bone]
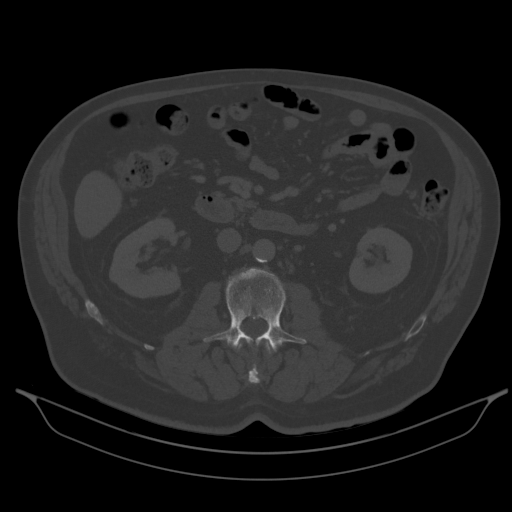
[im 160/213  soft-tissue]
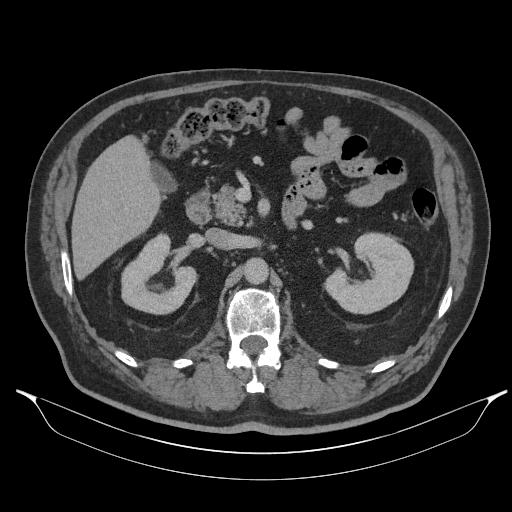
[im 186/213  soft-tissue]
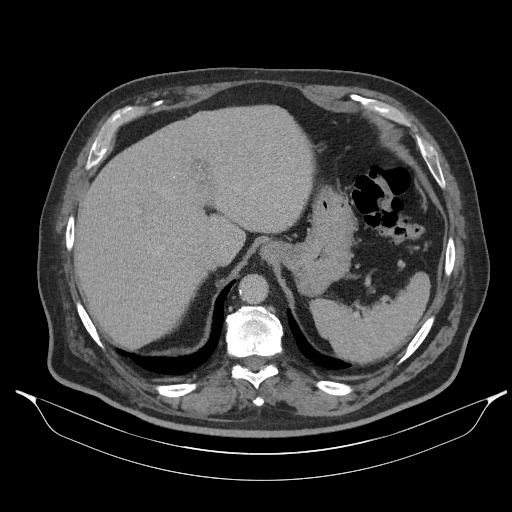
[im 199/213  soft-tissue]
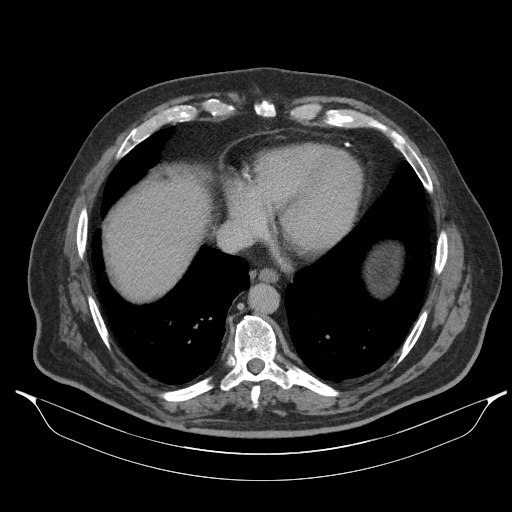

[15 of 46 positions shown; findings below may reference images not displayed]

FINDINGS: LUNGS AND PLEURA:  Bilateral groundglass densities noted on the previous exam 
are improved. Residual stable peripheral groundglass densities are noted 
scattered areas of atelectasis but no new suspicious pulmonary nodule is imaged. 
Peribronchial thickening. 
MEDIASTINUM:  No adenopathy. Normal heart size. No pericardial effusion. 
Moderate severe coronary artery calcification. 
CHEST WALL/AXILLA: Postsurgical change right axilla..  
HEPATOBILIARY: Fatty infiltration. No evidence for mass or biliary dilatation. 
Cholelithiasis. 
SPLEEN: Normal in size. 
PANCREAS: No ductal dilatation or mass.   
ADRENALS: No mass. 
GENITOURINARY: Stable nonobstructing renal calculi. No evidence for enhancing 
mass or hydronephrosis. No bladder mass. 
LYMPH NODES: No adenopathy. 
STOMACH, SMALL BOWEL AND COLON: Scattered colonic diverticula which are greatest 
in the sigmoid region with mild wall thickening but no pericolonic inflammatory 
process or collection. No bowel wall thickening or obstruction. 
VASCULAR STRUCTURES: No aneurysm.  
MUSCULOSKELETAL: No acute osseous abnormality. Scattered degenerative changes. 
Right orthopedic hip prosthesis. 
ADDITIONAL FINDINGS: Umbilical hernia containing fat..
IMPRESSION: Waxing and waning groundglass densities greatest in the right middle lobe and 
right lower lobe. No new suspicious pulmonary nodule. Waxing and waning nature 
of the nodules with peribronchial thickening suggests an inflammatory process. 
Recommend clinical correlation for any interval treatment. 
Colonic diverticula but no evidence of pericolonic inflammatory process or 
collection. 
No new thoracic, abdominal or pelvic adenopathy. 
Stable nonobstructing renal calculi. 
Cholelithiasis. 
RADIATION DOSE REDUCTION: All CT scans are performed using radiation dose 
reduction techniques, when applicable.  Technical factors are evaluated and 
adjusted to ensure appropriate moderation of exposure.  Automated dose 
management technology is applied to adjust the radiation doses to minimize 
exposure while achieving diagnostic quality images.

## 2021-12-01 ENCOUNTER — Encounter: Admit: 2021-12-01 | Payer: PRIVATE HEALTH INSURANCE | Attending: Vascular and Interventional Radiology

## 2021-12-06 ENCOUNTER — Encounter: Admit: 2021-12-06 | Payer: PRIVATE HEALTH INSURANCE

## 2021-12-06 DIAGNOSIS — C4359 Malignant melanoma of other part of trunk: Secondary | ICD-10-CM

## 2021-12-06 NOTE — Progress Notes
HIC#: [id number] Title: An Efficacy Study of Adjuvant Treatment With the Personalized Cancer Vaccine [phone] and PembroSubject Name: Jason Rowland: N/AMedical Record Number: [id number] Title of Informed Consent: COMPOUND AUTHORIZATION AND CONSENT FOR PARTICIPATION IN A RESEARCH PROJECTConsent Type: Reconsent YCC/IRB Approval Date: 04May2023Protocol Version: 4Protocol Date: 28Mar2023 Informed Consent obtained byInvestigator: Drema Halon, MDOther authorized personnel: Zeb Comfort CRC	Date of Verbal Consent and document mailed:  22Aug2023The above trial was presented in full to the participant.  The following topics were presented and discussed:-Study Title-Principal Investigator and Contact Information-Invitation to Participate and Description of Study-Purpose-Study Procedures and Randomization-Treatment Period-End of Treatment-Follow Up Period-Potential Risks, Side Effects, Discomforts, and Inconveniences-Methods of contraception-Benefits-Economic Considerations-Treatment Alternatives-Confidentiality and Authorization to collect, use, and disclose Protected Health Information-Voluntary Participation and [facility] participant was given ample time to review the document and ask and receive answers to questions by the investigator.  The participant verbalized understanding of the above information and stated all their questions were answered to their satisfaction. The consent was willingly signed by the participant/LAR, and a signed copy was provided to the participant to retain for their personal records. The participant agrees to comply with study and follow-up procedures. The Principal Investigator's contact information was provided to the participant. The informed consent was signed prior to initiating any study-related procedures. Visit Comment: [name] was notified verbally again regarding the new PI of the trial being Dr Drema Halon. The ICF document was mailed to him via [employer] for his records along with a copy of the most recent trial alert card

## 2021-12-21 ENCOUNTER — Telehealth: Admit: 2021-12-21 | Payer: PRIVATE HEALTH INSURANCE

## 2021-12-21 NOTE — Telephone Encounter
[facility] [facility] [phone]

## 2021-12-30 ENCOUNTER — Encounter: Admit: 2021-12-30 | Payer: PRIVATE HEALTH INSURANCE

## 2021-12-30 ENCOUNTER — Inpatient Hospital Stay: Admit: 2021-12-30 | Discharge: 2021-12-30 | Payer: BLUE CROSS/BLUE SHIELD

## 2022-01-19 ENCOUNTER — Encounter: Admit: 2022-01-19 | Payer: BLUE CROSS/BLUE SHIELD | Attending: Neurology

## 2022-01-27 IMAGING — MR MRI BRAIN W/WO CONTRAST
2 of 13 series · 5 of 48 positions shown · IV contrast (gadavist)
Comparison: MRI brain from September 28, 2021.

________________________________________________________________________________________________ 
MRI BRAIN W/WO CONTRAST, 01/27/2022 [DATE]: 
CLINICAL INDICATION: Melanoma.
TECHNIQUE: Multiplanar, multiecho position MR images of the brain were performed 
without and with 11.5 mL of Gadavist were injected intravenously by hand. 3.5 mL 
of Gadavist were discarded.  Patient was scanned on a 1.5T magnet.

[Series 802: T1 post-contrast · coronal · 1.0mm · 0.24mm/px · 3 of 180 slices shown (1 of 2)]
[im 20/180]
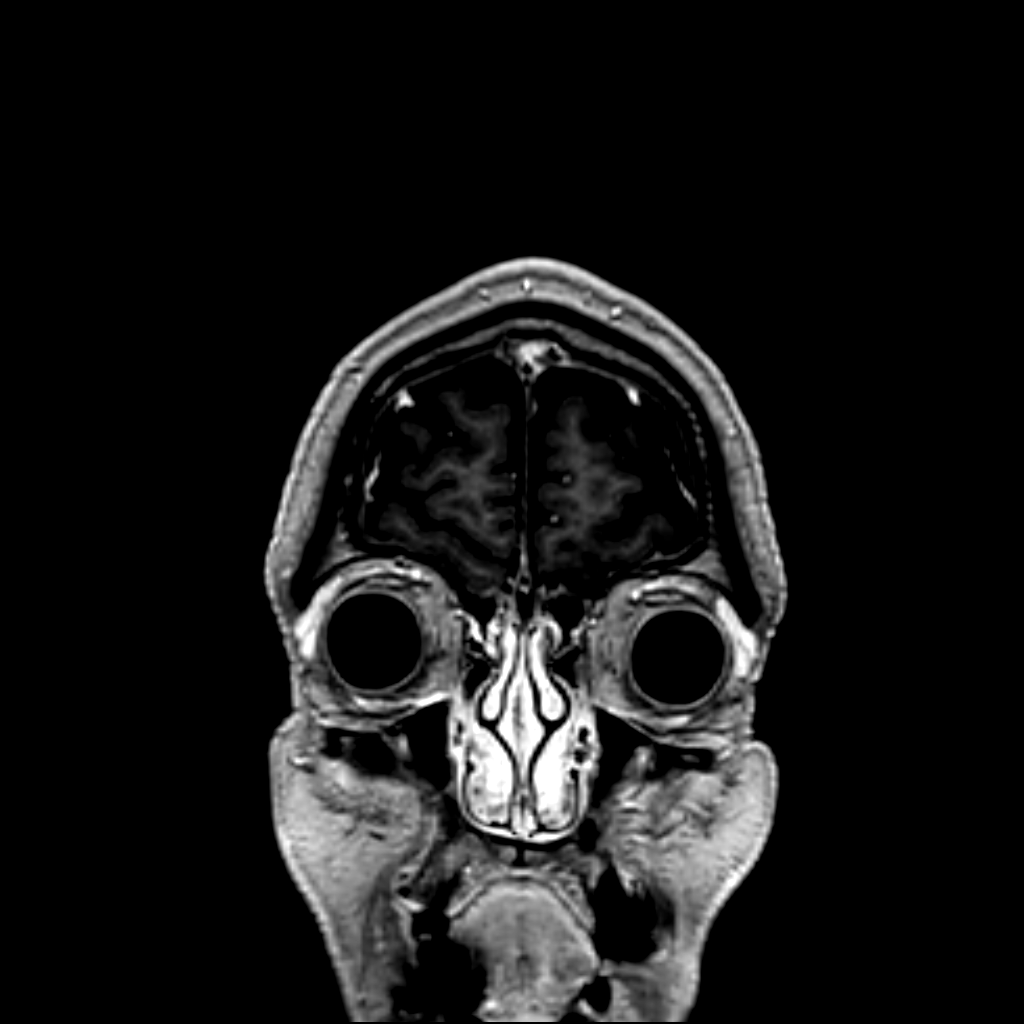
[im 100/180]
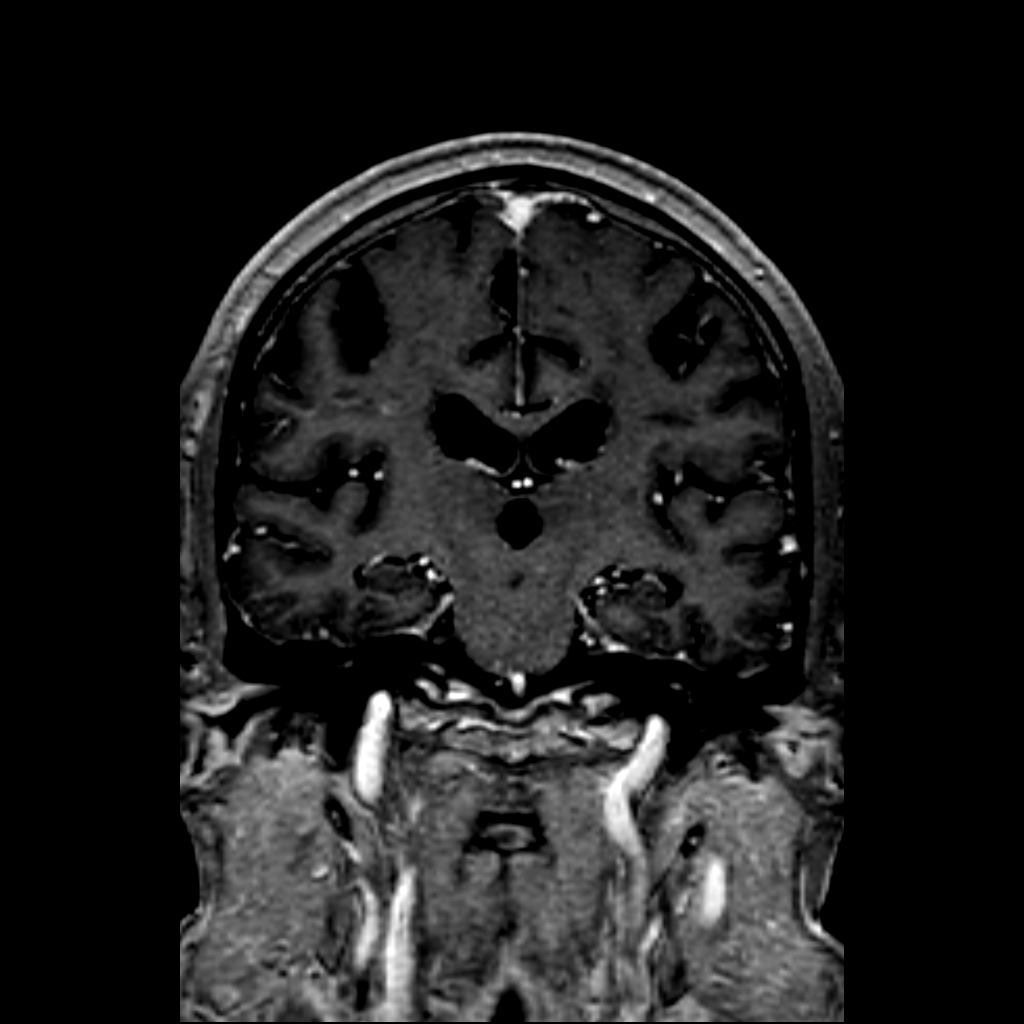
[im 160/180]
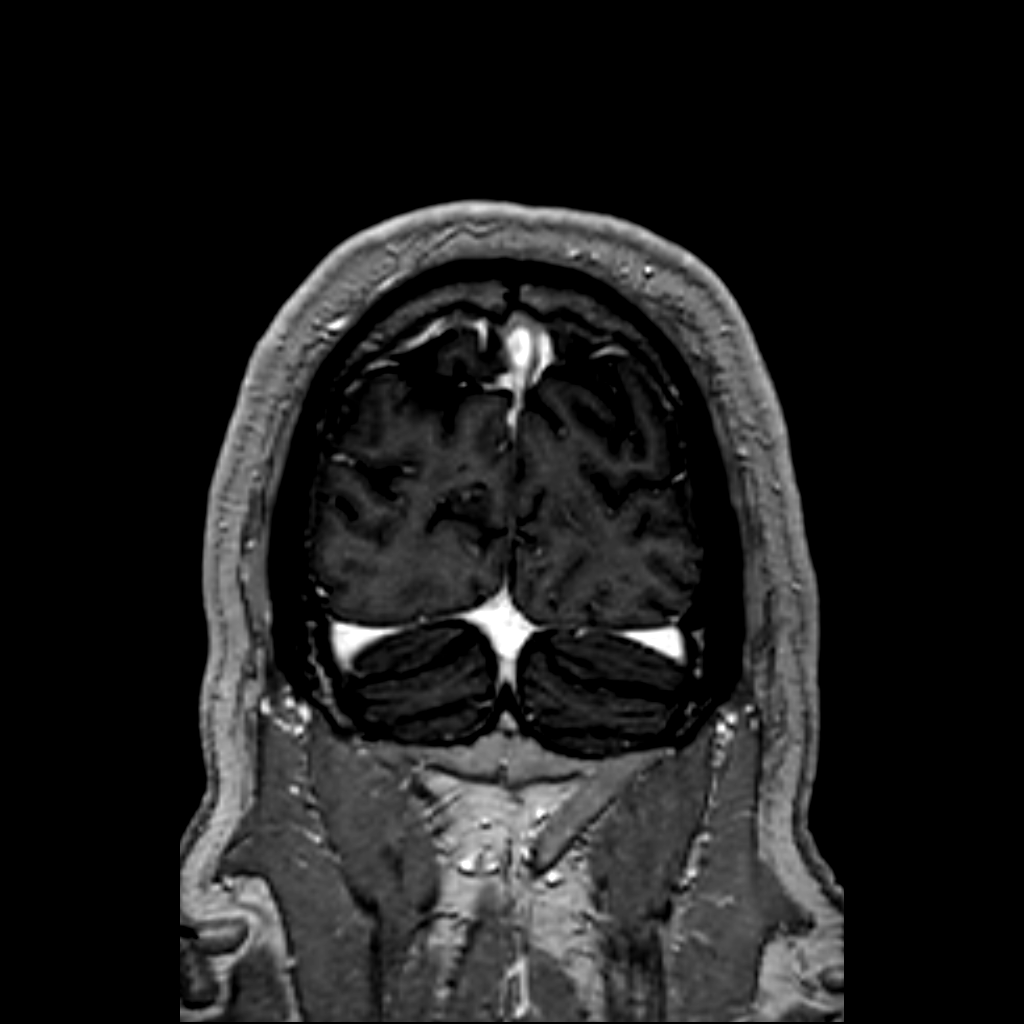

[Series 803: T1 post-contrast · axial · 1.0mm · 0.24mm/px · z∈[-34,+28]mm · 2 of 170 slices shown (2 of 2)]
[im 22/170]
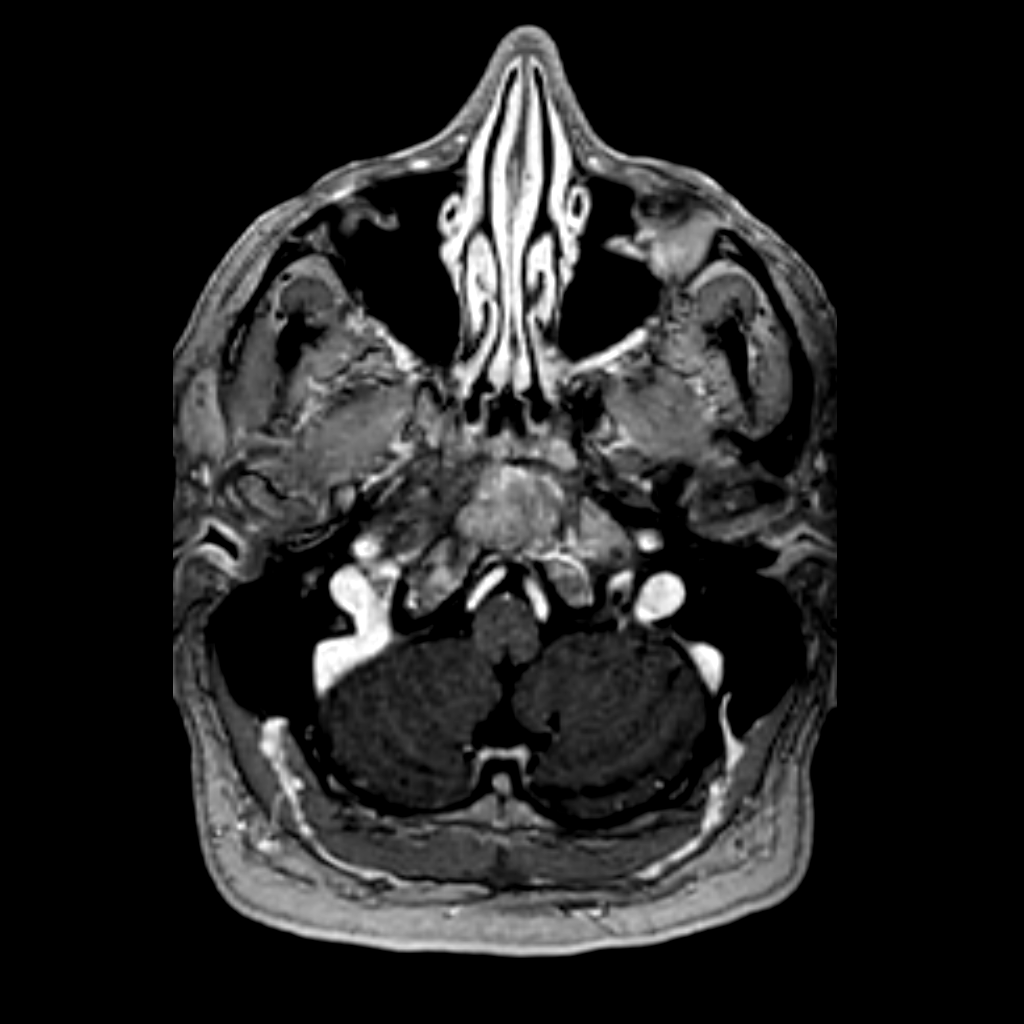
[im 85/170]
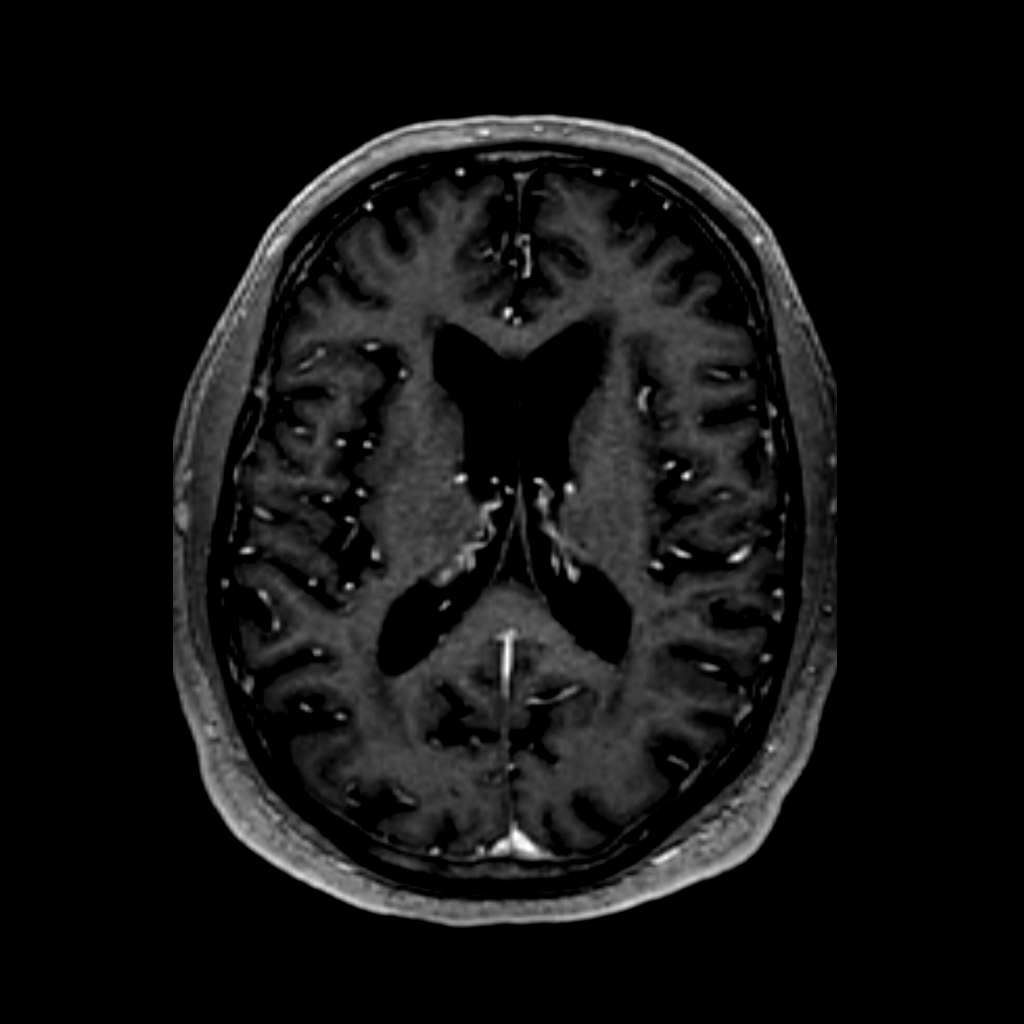

[5 of 48 positions shown; findings below may reference images not displayed]

FINDINGS: -------------------------------------------------------------------------------- 
------------------------- 
INTRACRANIAL: 
Restricted diffusion along the right posterior corona radiata measuring 2.4 x 
1.0 cm. No associated susceptibility artifact. There is faint linear internal 
enhancement within this region. Findings are consistent with subacute infarct. 
No abnormal foci of susceptibility artifact in the brain. Patency of 
intracranial vascular flow voids. Periventricular and deep white matter change, 
probably secondary to microangiopathy. This is stable and extensive. Stable 
focal encephalomalacia along the corpus callosum body. No acute intracranial 
hemorrhage, mass effect, midline shift.  
-------------------------------------------------------------------------------- 
----------------------- 
OTHER: 
ORBITS/SINUSES/T-BONES:  Status post bilateral cataract extraction.  Mastoid air 
cells and middle ear cavities are grossly clear.  Mild mucosal change in the 
left ethmoidal air cells. 
MARROW SIGNAL/SOFT TISSUES: No focal suspect signal abnormality.  
-------------------------------------------------------------------------------- 
-------------------
IMPRESSION: 1.  Subacute ischemic change on the right corona radiata. 
2.  No metastatic disease in the brain. 
3.  Discussed with Deonte Venable at [DATE] on 01/27/2022.

## 2022-02-02 IMAGING — CT CT CHEST/ABDOMEN/PELVIS WITH CONTRAST
2 of 7 series · 15 of 46 positions shown, 17 images · IV contrast (APPLIED)
Comparison: Comparison was made to the prior exam(s) within the last 12 months 
10/03/2021.

________________________________________________________________________________________________ 
CT CHEST/ABDOMEN/PELVIS WITH CONTRAST, 02/02/2022 [DATE]: 
CLINICAL INDICATION: Melanoma of the back treated with immunotherapy only. 
A search for DICOM formatted images was conducted for prior CT imaging studies 
completed at a non-affiliated media free facility.
TECHNIQUE: The chest, abdomen and pelvis were scanned from base of neck through 
the pubic rami 100 mL of Isovue 300 MDV injected intravenously on a high 
resolution low dose CT scanner. 0 Routine MPR and MIP 3D renderings performed 
with concurrent physician supervision.

[Series 7: coronal · coronal · 0.61mm/px · 3 of 150 slices shown]
[im 38/150  soft-tissue]
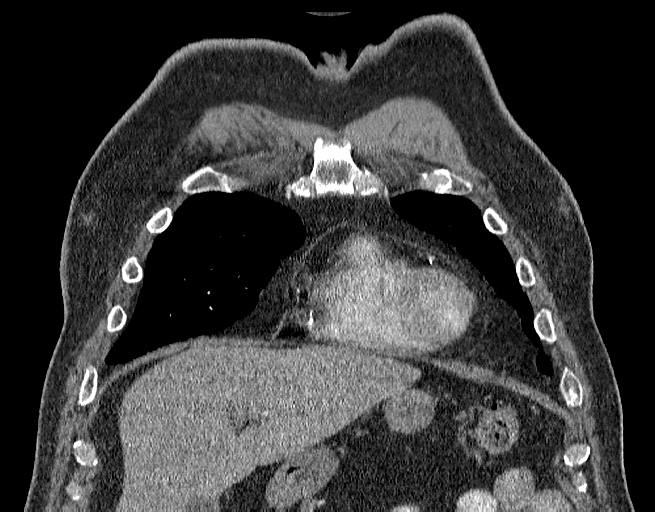
[im 75/150  soft-tissue]
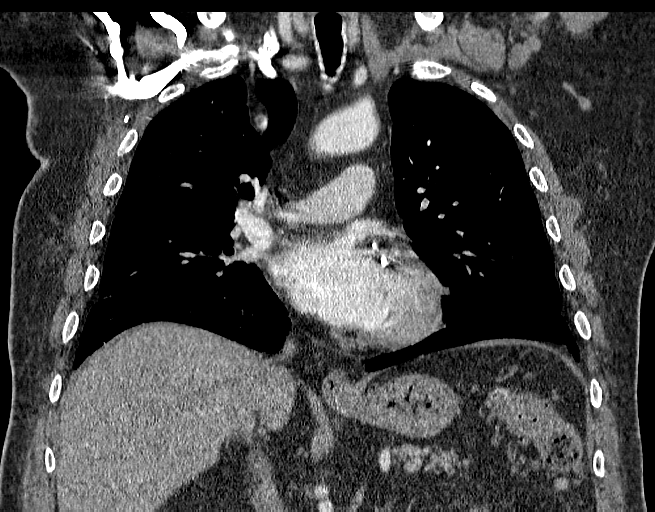
[im 112/150  soft-tissue]
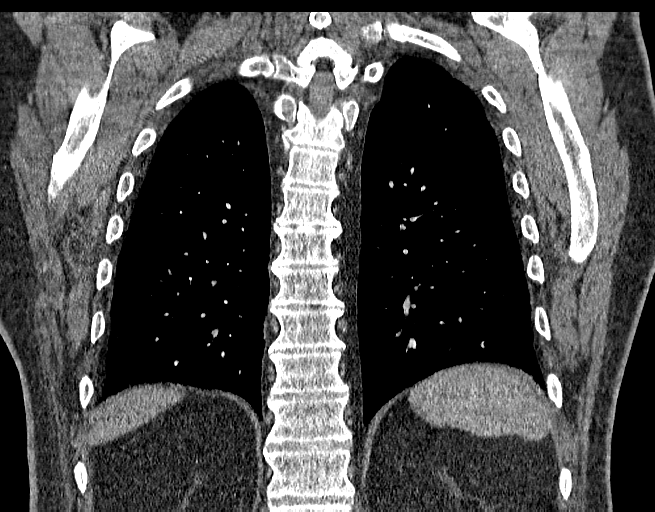

[Series 9: portal 3.0 i41s 2 · axial · portal-venous · 0.88mm/px · z∈[-695,-230]mm · 12 of 181 slices shown, 14 images]
[im 13/181  soft-tissue]
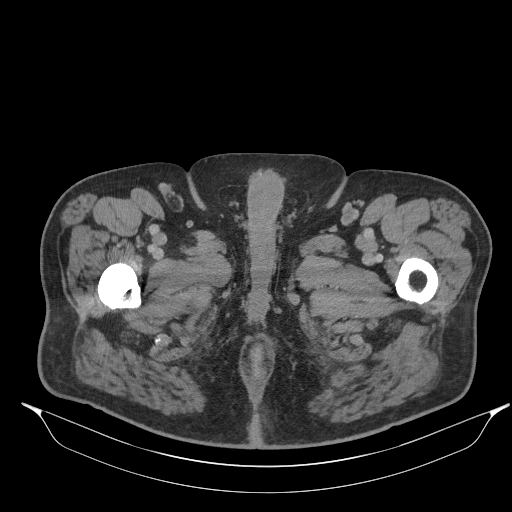
[im 13/181  bone]
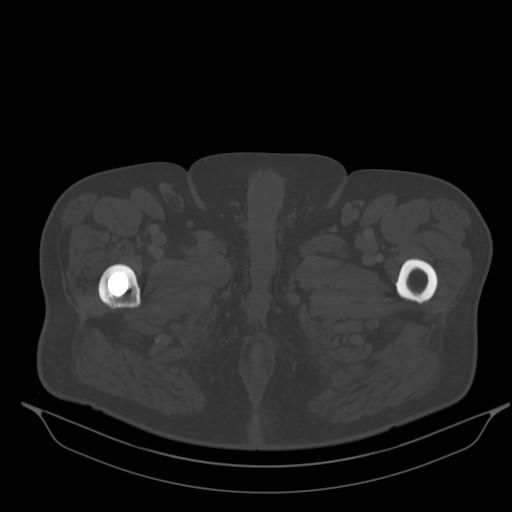
[im 26/181  soft-tissue]
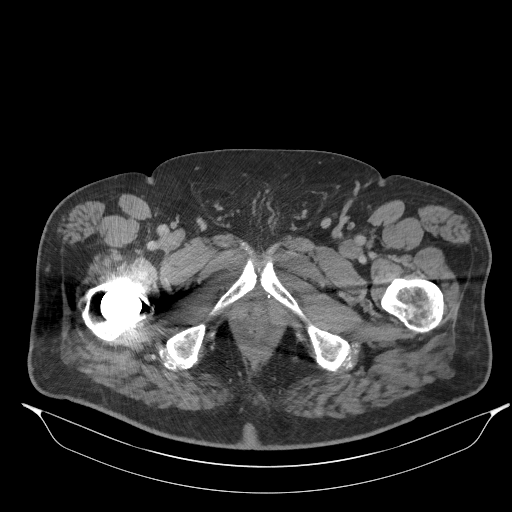
[im 39/181  soft-tissue]
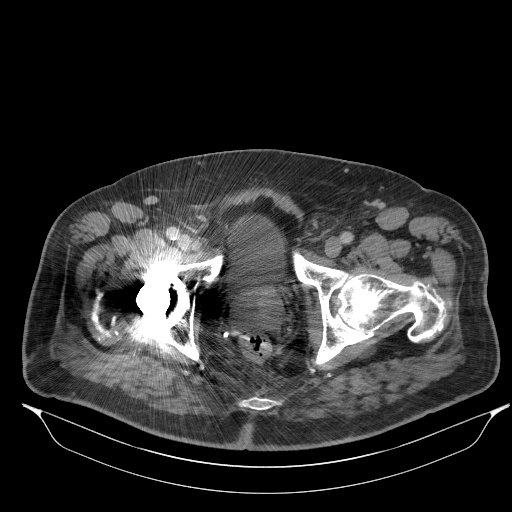
[im 52/181  soft-tissue]
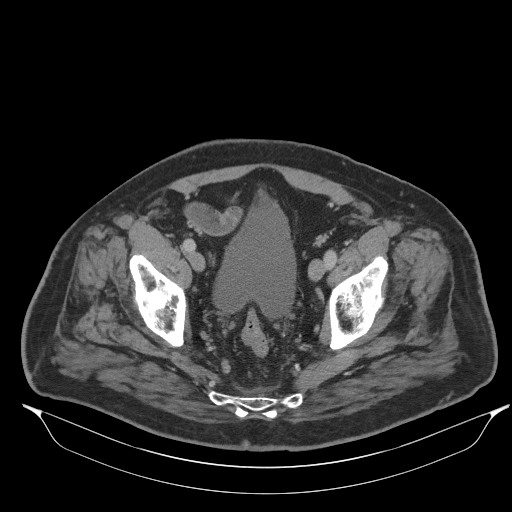
[im 65/181  soft-tissue]
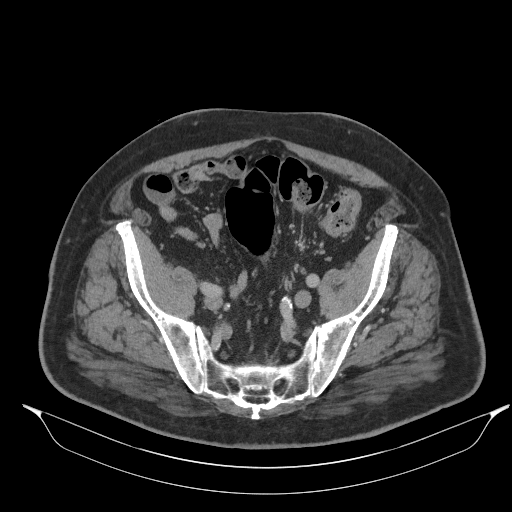
[im 78/181  soft-tissue]
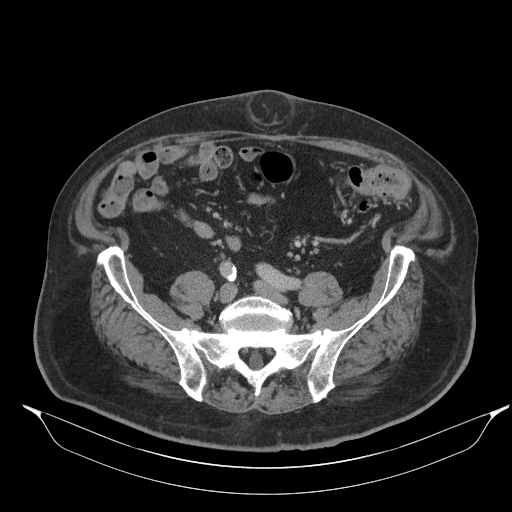
[im 103/181  soft-tissue]
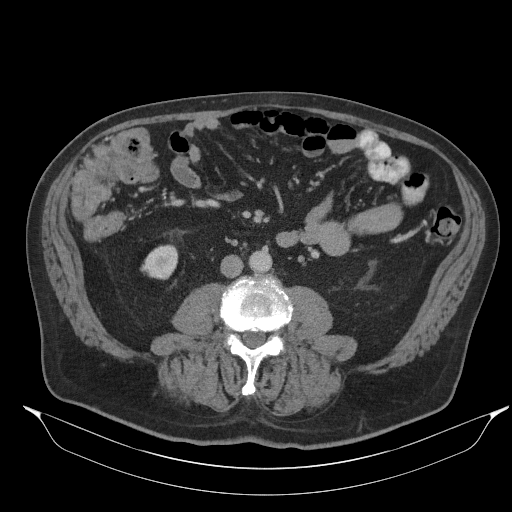
[im 116/181  soft-tissue]
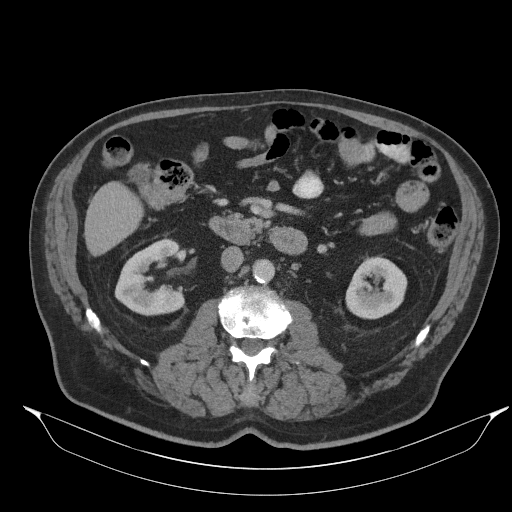
[im 129/181  soft-tissue]
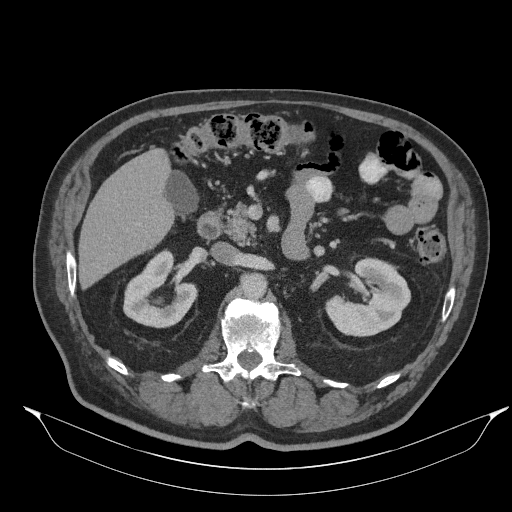
[im 129/181  bone]
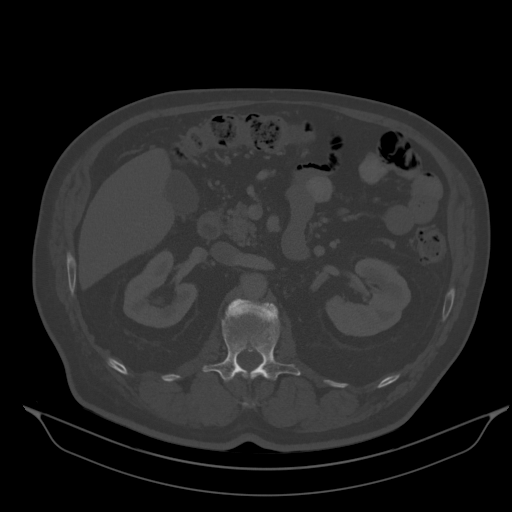
[im 142/181  soft-tissue]
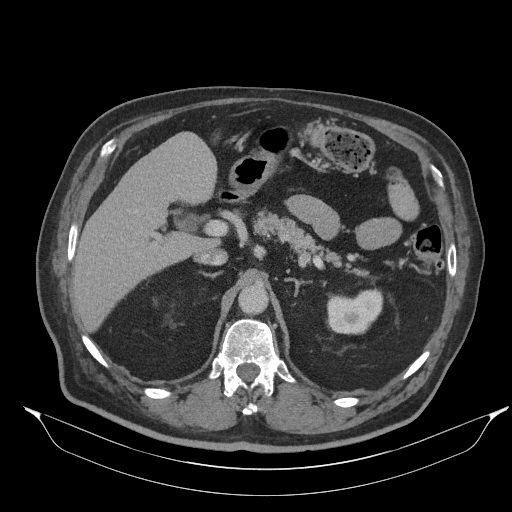
[im 155/181  soft-tissue]
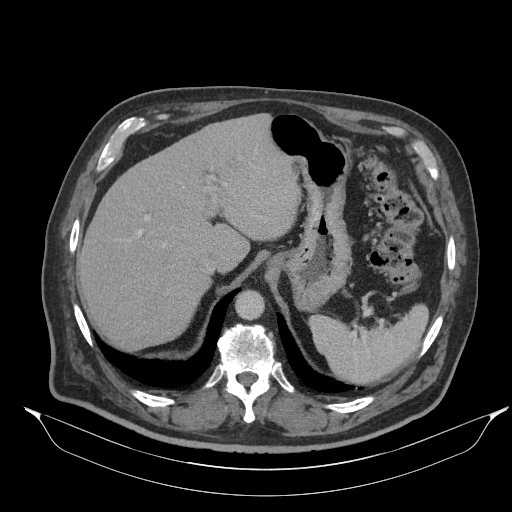
[im 168/181  soft-tissue]
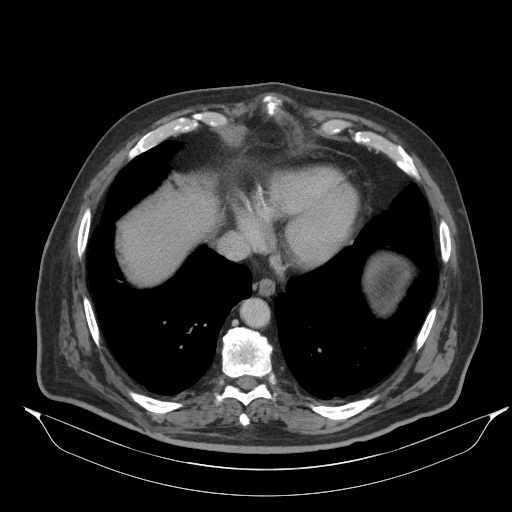

[15 of 46 positions shown; findings below may reference images not displayed]

FINDINGS: LUNGS AND PLEURA:  There is again waxing and waning of small groundglass 
densities in the lungs again best seen in the right middle lobe and right lower 
lobe some areas improving other slightly progressed, would again favor this to 
be due to infectious/inflammatory etiology rather than neoplastic etiology. No 
discrete new pulmonary nodules seen in the lungs. 
MEDIASTINUM:  No adenopathy. Normal heart size. No pericardial effusion. 
Moderate coronary artery calcifications noted with coronary stents in place. 
CHEST WALL/AXILLA: No mass or adenopathy.  
HEPATOBILIARY: No evidence for mass or biliary dilatation. Layering small 
gallstones in the gallbladder without inflammatory changes. 
SPLEEN: Normal in size. 
PANCREAS: No ductal dilatation or mass.   
ADRENALS: No mass. 
GENITOURINARY: Bilateral nonobstructing calculi with largest calculus in the 
lower pole of the right kidney measuring up to 5 mm in diameter. No evidence for 
enhancing mass or hydronephrosis. No bladder mass. 
LYMPH NODES: No adenopathy. 
STOMACH, SMALL BOWEL AND COLON: No bowel wall thickening or obstruction. Stomach 
is decompressed and difficult to assess. Small stable fat containing umbilical 
hernia measuring up to 4.6 x 3.4 cm. There is diverticulosis of the descending 
and sigmoid colon without active diverticulitis. 
VASCULAR STRUCTURES: No aneurysm.  
MUSCULOSKELETAL: No acute osseous abnormality. Scattered degenerative changes.  
ADDITIONAL FINDINGS: Right hip prosthesis in place causing streak artifact 
limiting evaluation of the pelvis. No gross mass or adenopathy in the pelvis or 
inguinal regions.
IMPRESSION: Persistent waxing and waning of small groundglass densities in the lungs again 
best seen in the right middle lobe and right lower lobe, these again felt to be 
due to infectious/inflammatory etiology rather than neoplastic etiology. No new 
pulmonary nodules in the lungs and no new adenopathy in the chest. 
No evidence of mass adenopathy or metastatic disease on CT the abdomen or 
pelvis. 
Cord is without inflammatory changes in the gallbladder. 
Small nonobstructing bilateral renal calculi which are unchanged. 
RADIATION DOSE REDUCTION: All CT scans are performed using radiation dose 
reduction techniques, when applicable.  Technical factors are evaluated and 
adjusted to ensure appropriate moderation of exposure.  Automated dose 
management technology is applied to adjust the radiation doses to minimize 
exposure while achieving diagnostic quality images.

## 2022-03-10 ENCOUNTER — Ambulatory Visit: Admit: 2022-03-10 | Payer: BLUE CROSS/BLUE SHIELD

## 2022-03-17 ENCOUNTER — Encounter: Admit: 2022-03-17 | Payer: PRIVATE HEALTH INSURANCE

## 2022-03-17 ENCOUNTER — Inpatient Hospital Stay: Admit: 2022-03-17 | Discharge: 2022-03-17 | Payer: BLUE CROSS/BLUE SHIELD

## 2022-03-17 ENCOUNTER — Ambulatory Visit: Admit: 2022-03-17 | Payer: BLUE CROSS/BLUE SHIELD

## 2022-03-17 DIAGNOSIS — R5383 Other fatigue: Secondary | ICD-10-CM

## 2022-03-17 DIAGNOSIS — C4359 Malignant melanoma of other part of trunk: Secondary | ICD-10-CM

## 2022-03-17 DIAGNOSIS — R197 Diarrhea, unspecified: Secondary | ICD-10-CM

## 2022-03-17 DIAGNOSIS — C7931 Secondary malignant neoplasm of brain: Secondary | ICD-10-CM

## 2022-03-17 DIAGNOSIS — Z23 Encounter for immunization: Secondary | ICD-10-CM

## 2022-03-17 LAB — CBC WITH AUTO DIFFERENTIAL
BKR POTASSIUM: 11.9 g/dL — ABNORMAL LOW (ref 13.2–17.1)
BKR WAM ABSOLUTE IMMATURE GRANULOCYTES.: 0.03 x 1000/ÂµL (ref 0.00–0.30)
BKR WAM ABSOLUTE LYMPHOCYTE COUNT.: 2.03 x 1000/ÂµL (ref 0.60–3.70)
BKR WAM ABSOLUTE NRBC (2 DEC): 0 x 1000/??L (ref 0.00–1.00)
BKR WAM ANALYZER ANC: 8.64 x 1000/ÂµL — ABNORMAL HIGH (ref 2.00–7.60)
BKR WAM BASOPHIL ABSOLUTE COUNT.: 0.08 x 1000/ÂµL (ref 0.00–1.00)
BKR WAM BASOPHILS: 0.7 % (ref 0.0–1.4)
BKR WAM EOSINOPHIL ABSOLUTE COUNT.: 0.38 x 1000/??L — ABNORMAL HIGH (ref 0.00–1.00)
BKR WAM EOSINOPHILS: 3.2 % (ref 0.0–5.0)
BKR WAM HEMATOCRIT (2 DEC): 36.2 % — ABNORMAL LOW (ref 38.50–50.00)
BKR WAM HEMOGLOBIN: 11.9 g/dL — ABNORMAL LOW (ref 13.2–17.1)
BKR WAM IMMATURE GRANULOCYTES: 0.3 % (ref 0.0–1.0)
BKR WAM LYMPHOCYTES: 16.9 % — ABNORMAL LOW (ref 17.0–50.0)
BKR WAM MCH (PG): 29.5 pg (ref 27.0–33.0)
BKR WAM MCHC: 32.9 g/dL (ref 31.0–36.0)
BKR WAM MCV: 89.6 fL (ref 80.0–100.0)
BKR WAM MONOCYTE ABSOLUTE COUNT.: 0.82 x 1000/ÂµL (ref 0.00–1.00)
BKR WAM MONOCYTES: 6.8 % (ref 4.0–12.0)
BKR WAM NEUTROPHILS: 72.1 % — ABNORMAL HIGH (ref 39.0–72.0)
BKR WAM NUCLEATED RED BLOOD CELLS: 0 % (ref 0.0–1.0)
BKR WAM PLATELETS: 354 x1000/??L (ref 150–420)
BKR WAM RDW-CV: 14.2 % (ref 11.0–15.0)
BKR WAM RED BLOOD CELL COUNT.: 4.04 M/??L (ref 4.00–6.00)
BKR WAM WHITE BLOOD CELL COUNT: 12 x1000/??L — ABNORMAL HIGH (ref 4.0–11.0)
FRUCTOSAMINE: 0.03 x 1000/??L — ABNORMAL HIGH (ref 205–285)

## 2022-03-17 LAB — COMPREHENSIVE METABOLIC PANEL
BKR A/G RATIO: 0.8 — ABNORMAL LOW (ref 1.0–2.2)
BKR ALANINE AMINOTRANSFERASE (ALT): 17 U/L (ref 9–59)
BKR ALBUMIN: 3.5 g/dL — ABNORMAL LOW (ref 3.6–4.9)
BKR ALKALINE PHOSPHATASE: 86 U/L (ref 9–122)
BKR ANION GAP: 11 (ref 7–17)
BKR ASPARTATE AMINOTRANSFERASE (AST): 23 U/L (ref 10–35)
BKR AST/ALT RATIO: 1.4
BKR BILIRUBIN TOTAL: 0.4 mg/dL (ref 0.0–<=1.2)
BKR BLOOD UREA NITROGEN: 21 mg/dL (ref 8–23)
BKR BUN / CREAT RATIO: 17.8 (ref 8.0–23.0)
BKR CALCIUM: 9.3 mg/dL (ref 8.8–10.2)
BKR CHLORIDE: 99 mmol/L (ref 98–107)
BKR CO2: 27 mmol/L (ref 20–30)
BKR CREATININE: 1.18 mg/dL (ref 0.40–1.30)
BKR EGFR, CREATININE (CKD-EPI 2021): >60 mL/min/{1.73_m2} (ref >=60)
BKR GLOBULIN: 4.3 g/dL — ABNORMAL HIGH (ref 2.3–3.5)
BKR GLUCOSE: 145 mg/dL — ABNORMAL HIGH (ref 70–100)
BKR PROTEIN TOTAL: 7.8 g/dL — ABNORMAL LOW (ref 6.6–8.7)
BKR SODIUM: 137 mmol/L (ref 136–144)
BKR WAM MPV: 9.3 mg/dL (ref 8.8–10.2)

## 2022-03-17 LAB — LACTATE DEHYDROGENASE: BKR LACTATE DEHYDROGENASE: 163 U/L (ref 122–241)

## 2022-03-17 LAB — TROPONIN T HIGH SENSITIVITY, NO REFLEX (BH GH LMW YH): BKR TROPONIN T HS NO REFLEX: 72 ng/L — ABNORMAL HIGH

## 2022-03-17 LAB — CORTISOL: BKR CORTISOL, PLASMA: 9.4 ug/dL

## 2022-03-17 LAB — CK     (BH GH L LMW YH): BKR CREATINE KINASE TOTAL: 122 U/L (ref 11–204)

## 2022-03-17 LAB — TSH W/REFLEX TO FT4     (BH GH LMW Q YH): BKR THYROID STIMULATING HORMONE: 65.7 ??IU/mL — ABNORMAL HIGH

## 2022-03-17 LAB — T4, FREE: BKR FREE T4: 0.45 ng/dL — ABNORMAL LOW (ref 0.60–3.70)

## 2022-03-17 MED ORDER — FLU VACCINE QS2023-24(65YR UP)(PF)240 MCG/0.7 ML INTRAMUSCULAR SYRINGE
240 mcg/0.7 mL | Freq: Once | INTRAMUSCULAR | Status: CP
Start: 2022-03-17 — End: ?
  Administered 2022-03-17: 16:00:00 240 mL via INTRAMUSCULAR

## 2022-03-17 NOTE — Progress Notes
Flu vaccine administered IM to L deltoid as ordered, bandaid applied.  Flu vaccine written information provided to patient, verbalized understanding.  Stool sample collection kit provided to patient with [facility] requisition for lab orders.  Discharged to home in stable condition, in care of self.

## 2022-03-18 ENCOUNTER — Inpatient Hospital Stay: Admit: 2022-03-18 | Discharge: 2022-03-18 | Payer: BLUE CROSS/BLUE SHIELD

## 2022-03-18 ENCOUNTER — Encounter: Admit: 2022-03-18 | Payer: PRIVATE HEALTH INSURANCE

## 2022-03-19 ENCOUNTER — Encounter: Admit: 2022-03-19 | Payer: PRIVATE HEALTH INSURANCE

## 2022-03-19 ENCOUNTER — Inpatient Hospital Stay: Admit: 2022-03-19 | Discharge: 2022-03-19 | Payer: BLUE CROSS/BLUE SHIELD

## 2022-03-20 NOTE — Progress Notes
NAME:  Jason Rowland ([birth year])AGE: 76 y.o. 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  Noted 09/18/20 to have new brain lesion (too small for biopsy or GKRS).MOLECULAR PROFILING:0% tumor and immune cell PD-L1 staining50-gene panel:  DNA VARIANT DETECTED ? ? 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 ? 74% PRIOR TX:  - WLE and SLNB 03/03/19- Adjuvant Moderna clinical trial (?A Phase 2 Randomized Study of Adjuvant Immunotherapy with the Personalized Cancer Vaccine [id number] and Pembrolizumab Versus Pembrolizumab Alone After Complete Resection of High-Risk Melanoma? Protocol [id number], HIC 47829) C1D1 05/08/19. Last dose 04/27/20. Randomized to the pembrolizumab and vaccine arm.- 07/22/20 resection of recurrent LN in the right upper back (1.6 cm LN with 1.1 cm tumor deposit)- ipilimumab (3 mg/kg) + nivolumab (1 mg/kg) followed by nivo - (10/08/20 - 06/10/21) with concern for radiographic pneumonitis (G1)ACTIVE TX:   Active surveillanceOnc Hx: Jason Rowland (prefers to be called Jason Rowland) is a 76 y.o. male who worked for 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  He reports it appeared approx 10 months prior; his PCP originally thought it was a pimple.  Dr. Lorn Junes at [facility] later evaluated it and also thought it was a pimple and injected it with intralesional kenalog x 2 and oral cephalexin without improvement.  The lesion increased in size and burned whenever he leaned against anything.  Denies night sweats and weight loss.  Lucie Leather did a I&D on 01/07/19  with only slight bloody discharge, so a punch biopsy was obtain with path showing metastatic melanoma.  On 01/20/19, additional biopsies were done to try to evaluate a primary site, but we do not have this tissue.  Per patient report, these biopsies were negative. MRI brain 01/30/19 without intracranial disease.  PET done 02/07/19 showing preepiglottic FDG activity s/p ENT evaluation which was negative for any visible malignancy.  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 LDH elevated to 251 at diagnosis. His case was presented at the [facility] Melanoma tumor board on 02/13/19 and consensus was to ask ENT for random biopsies of the BOT and pre-epiglottic PET-avid areas, as we do not want to miss a head and neck cancer but otherwise not felt to be related to his melanoma.  The cervical LN were not thought to be related.  He should also have gastroenterology evaluation for a PET-avid stranding lesion in the transverse colon.  Dr. Landis Gandy (ENT) performed BOT and vallecula random biopsies 02/20/19 which only showed chronic inflammation and no malignancy.  He had a colonoscopy by Dr. Linton Flemings on 02/27/19 showing only a tubular adenoma in the sigmoid colon.  Overall, it was felt that his right mid back lesion is the site of his primary disease, and the metastatic melanoma pathology interpretation may have been complicated due to repeat I&Ds and steroid injections that may have altered the architecture of the primary lesion.  In the absence of any confirmed metastatic disease, he underwent WLE and LND with Dr. Duanne Moron on 03/03/19.  Final path showed a 30 mm thick melanoma, 2 mitoses/mm2, non-brisk TILs, no tumor regression,  LVI+, microsatellites+, negative margins, and 3/35 lymph nodes (largest LN 4.5 x 3 cm) were positive for melanoma with presence of lymphatic invasion without extranodal extension.He uses a cane sometimes as he has some balance issues due to neuropathy in his feet.  Otherwise, he is fully functional, active, and independent in ADLs/iADLs.  No hx of autoimmune disease.03/27/19 signed consent for the Moderna trial; randomized to pembro and vaccine arm.  Started treatment C1D1 05/08/19.04/24/19 noted to have wound dehiscence, non-infected, healing by secondary intent.  Re-instated VNA services to assist with dressing changes and packing.  Wound completely healed by 06/20/19.10/27/19, 01/12/20, and 04/24/20 surveillance scans NED.07/05/20 - [place] chest showing an increased 1.0 cm subcutaneous nodule in the right upper back.  Bx 07/06/20 not diagnostic.  Excision by Dr. Duanne Moron 07/22/20 showed metastatic melanoma involving 1 LN with 11 mm tumor deposit in a 1.6 cm LN.   09/18/20 - MRI brain showing a 8 mm left hippocampus lesion and a 6 mm lesion in the left mesial temporal lobe.  Reports new subtle memory issues.10/08/20 - C1 ipilimumab (3 mg/kg) + nivolumab (1 mg/kg)05/03/21 - [place] CAP showed multiple non-specific areas of diffuse enhancement in the b/l lungs c/f pneumonitis.  Held further treatment (last dose 06/10/21).  No changes to his chronic DOE.06/27/21 - [place] CAP with progressed groundglass densities bilaterally.  Reviewed at TB; felt consistent with pneumonitis. 07/2021 - moved to [facility]; follows with Dr. Bayard Males from [facility] 6/19/3 - [place] CAP from [facility] [place] with impr groundglass densities.Interval ZO:XWRU returns for follow-up.  He has been getting care in [facility] and was last seen by our team 06/10/21.  He brought in scans from [facility] from 01/27/22 and 02/02/22; no report available.He has an appt with Dr. Gerilyn Pilgrim 03/23/22 after returning to [facility] later today.He notes a poor appetite.  Has diarrhea to beef and other foods, so this has limited his diet.  He still has 1-2 episodes of diarrhea/day.  Takes imodium, max 2 doses in a day with control of sx.  Denies any N/V.Feels significant fatigue.  Goes to sleep at 1 AM and wakes up at 8 AM.  Walks a lot and does 4000 steps a day.He fell recently (mechanical fall) and hurt his right shoulder and has bruising under his right eye.   Balance is stable.  He drove himself from [facility].Reports his BP at home is much better than during his visit today.ECOG:  1  Pain: 0/10Review of Systems:No fevers, chills, night sweats, lightheadedness, HA, CP, palpitations, wheezing, abd pain, constipation, blood in the urine or stools, dysuria.  Diabetic neuropathy in the hands and feet stable.   Positive responses on ROS as noted in interval hx. PAST MEDICAL HISTORY:Past Medical History: Diagnosis Date ? Diabetes mellitus (HC Code)  ? High cholesterol  ? Kidney stones   40 years ago ? 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 Malignant melanoma of torso excluding breast (HC Code)  Macular degenerationSURGICAL HISTORY:Past Surgical History: Procedure Laterality Date ? COLONOSCOPY   ? CORONARY ANGIOPLASTY WITH STENT PLACEMENT Bilateral   patient denies ? laryngoscopy with biopsy  02/20/2019 ? ROTATOR CUFF REPAIR Left 1999 ? TONSILLECTOMY    age 58 ? TOTAL HIP ARTHROPLASTY Right 2002 FAMILY HISTORY:Family History Problem Relation Age of Onset ? Heart disease Mother  ? Bladder cancer Father 67 ? Kidney cancer Sister  ? Leukemia Brother 37 ? No Known  Problems Daughter  ? No Known Problems Son  SOCIAL HISTORY:Social History Socioeconomic History ? Marital status: Divorced   Spouse name: Not on file ? Number of children: Not on file ? Years of education: Not on file ? Highest education level: Not on file Occupational History ? Not on file Tobacco Use ? Smoking status: Never ? Smokeless tobacco: Never Vaping Use ? Vaping Use: Never used Substance and Sexual Activity ? Alcohol use: Not Currently ? Drug use: No ? Sexual activity: Not on file Other Topics Concern ? Not on file Social History Narrative  Divorced, works for [employer] in [place] will be retiring 03/17/19.  Has 1 son in [place] and 1 daughter in [place]. Llives in [place]  Social Determinants of Health Financial Resource Strain: Not on file Food Insecurity: Not on file Transportation Needs: Not on file Physical Activity: Not on file Stress: Not on file Social Connections: Not on file Intimate Partner Violence: Not on file Housing Stability: Not on file ALLERGIES:No Known AllergiesMEDICATIONS:Current Outpatient Medications Medication Sig ? aspirin 81 MG EC tablet Take 1 tablet (81 mg total) by mouth nightly. ? FREESTYLE LIBRE 14 DAY sensor kit 1 each by Other route every 14 (fourteen) days. ? glipiZIDE (GLUCOTROL XL) 10 MG 24 hr tablet Take 1 tablet (10 mg total) by mouth nightly. ? ibuprofen (ADVIL,MOTRIN) 200 mg tablet Take 2 tablets (400 mg total) by mouth every 6 (six) hours as needed (alternate with tylenol for mild to moderate pain). ? loperamide (IMODIUM A-D) 2 mg tablet Take 1 tablet (2 mg total) by mouth See Admin Instructions. 4mg  ( 2 tablets ) in the morning. And then with each bm take an additional one table . Max dose per day 16mg  ? simvastatin (ZOCOR) 20 MG tablet Take 1 tablet (20 mg total) by mouth nightly. ? TOUJEO SOLOSTAR U-300 300 unit/mL (1.5 mL) pen Inject 50 Units under the skin nightly. ? vit A/vit C/vit E/zinc/copper (PRESERVISION AREDS ORAL) Take by mouth daily. ? zolpidem (AMBIEN) 10 mg tablet nightly.. ? acetaminophen (TYLENOL) 325 mg tablet Take 3 tablets (975 mg total) by mouth every 6 (six) hours as needed (mild to moderate pain). (Patient not taking: Reported on 07/08/2021) ? ALPRAZolam (XANAX) 0.5 mg tablet Take 1 tablet (0.5 mg total) by mouth as needed. (Patient not taking: Reported on 07/08/2021) ? indomethacin (INDOCIN) 25 mg capsule 3 (three) times daily. (Patient not taking: Reported on 06/10/2021) ? levothyroxine (SYNTHROID, LEVOTHROID) 50 MCG tablet Take 1 tablet (50 mcg total) by mouth daily. ? triamcinolone (KENALOG) 0.1 % ointment Apply topically 2 (two) times daily as needed. To areas of rash. (Patient not taking: Reported on 03/17/2022) No current facility-administered medications for this visit. VITALS: BP (!) 144/94  - Pulse 88  - Temp 97.3 ?F (36.3 ?C) (Temporal)  - Resp 17  - Wt 104.4 kg  - SpO2 99%  - BMI 32.56 kg/m?  PHYSICAL EXAM:Gen:  NAD, pleasant HEENT:  EOMI, PEARLLN:  No cervical, supraclavicular, axillary, or inguinal LAD. CV:  No murmurs, RRR, no m/r/g Pulm:  CTA b/lAbd:  Soft, no tenderness, no guarding or rebound, no organomegaly, no ascitesExt:  WWP, no edema.  Lymphedema present in the right arm, unchanged.Neuro:   No focal deficitsPsych:  Normal mood and affectSkin:  Scar on the right upper back well healed without evidence of recurrence. Small scab near towards the center/outer region of the scar.  Flaking skin/dryness in the middle portion of the scar.  Right axillary scar well healed.  Scar from  LN recurrence well healed. Eczema to umbilical area stable. MSK:  Right shoulder joint tender with limited ROMLABS:Results for orders placed or performed in visit on 03/17/22 Lactate dehydrogenase Result Value Ref Range  LD 163 122 - 241 U/L TSH w/reflex to FT4     (BH GH LMW Q YH) Result Value Ref Range  Thyroid Stimulating Hormone 65.700 (H) See Comment ?IU/mL Cortisol Result Value Ref Range  Cortisol 9.4 See Comment ug/dL Troponin T High Sensitivity, NO REFLEX Result Value Ref Range  High Sensitivity Troponin T 72 (H) See Comment ng/L CK     (BH GH L YH) Result Value Ref Range  Total CK 122 11 - 204 U/L CBC auto differential Result Value Ref Range  WBC 12.0 (H) 4.0 - 11.0 x1000/?L  RBC 4.04 4.00 - 6.00 M/?L  Hemoglobin 11.9 (L) 13.2 - 17.1 g/dL  Hematocrit 33.29 (L) 51.88 - 50.00 %  MCV 89.6 80.0 - 100.0 fL  MCH 29.5 27.0 - 33.0 pg  MCHC 32.9 31.0 - 36.0 g/dL  RDW-CV 41.6 60.6 - 30.1 %  Platelets 354 150 - 420 x1000/?L  MPV 10.4 8.0 - 12.0 fL  Neutrophils 72.1 (H) 39.0 - 72.0 %  Lymphocytes 16.9 (L) 17.0 - 50.0 %  Monocytes 6.8 4.0 - 12.0 %  Eosinophils 3.2 0.0 - 5.0 %  Basophil 0.7 0.0 - 1.4 %  Immature Granulocytes 0.3 0.0 - 1.0 %  nRBC 0.0 0.0 - 1.0 %  ANC(Abs Neutrophil Count) 8.64 (H) 2.00 - 7.60 x 1000/?L  Absolute Lymphocyte Count 2.03 0.60 - 3.70 x 1000/?L  Monocyte Absolute Count 0.82 0.00 - 1.00 x 1000/?L  Eosinophil Absolute Count 0.38 0.00 - 1.00 x 1000/?L  Basophil Absolute Count 0.08 0.00 - 1.00 x 1000/?L  Absolute Immature Granulocyte Count 0.03 0.00 - 0.30 x 1000/?L  Absolute nRBC 0.00 0.00 - 1.00 x 1000/?L Comprehensive metabolic panel Result Value Ref Range  Sodium 137 136 - 144 mmol/L  Potassium 4.1 3.3 - 5.3 mmol/L  Chloride 99 98 - 107 mmol/L  CO2 27 20 - 30 mmol/L  Anion Gap 11 7 - 17  Glucose 145 (H) 70 - 100 mg/dL  BUN 21 8 - 23 mg/dL  Creatinine 6.01 0.93 - 1.30 mg/dL  Calcium 9.3 8.8 - 23.5 mg/dL  BUN/Creatinine Ratio 57.3 8.0 - 23.0  Total Protein 7.8 6.6 - 8.7 g/dL  Albumin 3.5 (L) 3.6 - 4.9 g/dL  Total Bilirubin 0.4 <=2.2 mg/dL  Alkaline Phosphatase 86 9 - 122 U/L  Alanine Aminotransferase (ALT) 17 9 - 59 U/L  Aspartate Aminotransferase (AST) 23 10 - 35 U/L  Globulin 4.3 (H) 2.3 - 3.5 g/dL  A/G Ratio 0.8 (L) 1.0 - 2.2  AST/ALT Ratio 1.4 Reference Range Not Established  eGFR (Creatinine) >60 >=60 mL/min/1.48m2 T4, free Result Value Ref Range  Free T4 0.45 (L) See Comment ng/dL EKG 05/22/40:HCWCBJS/EGBT:DV Shoulder 2+ views-RightResult Date: 11/17/2023XR SHOULDER 2+ VIEWS-RIGHT: 03/03/2022 2:43 PM CLINICAL HISTORY: Osteoarthritis of right shoulder, 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 FINDINGS: The osseous structures are intact. No evidence of fracture or dislocation. There is vague soft tissue calcification identified at the level of the shoulder joint possibly within the tendon or joint capsule. Joint spaces well-maintained. No bony erosion. No acute osseous injury.1. Soft tissue vague calcification posteriorly due to joint capsule or rotator cuff. 2. No evidence of acute osseous injury or dislocation. 07/22/20: SOFT TISSUE, RIGHT UPPER BACK, EXCISION: ? ? ? ?- METASTATIC MELANOMA INVOLVING A LYMPH  NODE (1/1) ? ? ? ? ? - SIZE OF THE LARGEST TUMOR DEPOSIT: 11 MM ? ? ? ? ? - SIZE OF THE LYMPH NODE: 1.6 CM WLE and SLN 03/03/19:LYMPH NODES, RIGHT AXILLARY LEVELS 1, 2 AND 3, LYMPH NODE DISSECTION: ?  ? ? - ?THREE OF THIRTY-FIVE LYMPH NODES, POSITIVE FOR MELANOMA (3 OF 35) WITH LARGEST INVOLVED LYMPH NODE MEASURING 4.5 X 3 CM ? ? ?- LYMPHATIC INVASION IDENTIFIED ? ? ?- NO EXTRANODAL EXTENSION IDENTIFIED 1. ?SKIN, BACK, RIGHT UPPER, EXCISION: ? ? ? ?- MALIGNANT MELANOMA, SEE NOTE AND SYNOPTIC SUMMARY Note: Sections show a tumor composed of S100 positive epithelioid cells within the dermis and subcutaneous tissue. Cytokeratin AE1/AE3 and desmin are negative. A junctional component is not identified. The findings would be compatible with a primary dermal melanoma or a metastatic lesion. Clinical correlation is suggested. 2. ?SKIN, BACK, EXCISION: ? ? ? ?- BENIGN FIBROADIPOSE TISSUE AND SKELETAL MUSCLE SYNOPTIC SUMMARY MELANOMA OF THE SKIN Tumor Site: ? ? Back Laterality: ? ? Right Procedure: ? ? Primary excision Maximum Tumor Thickness (Depth): ? ? 30 mm Ulceration: ? ? Not identified Mitotic Rate (Mitoses/mm2): ? ? 2 Anatomic Level: ? ? V (Melanoma invades subcutis) Growth Phase: ? ? Vertical Histologic Type: ? ? Melanoma, NOS Tumor-Infiltrating Lymphocytes: ? ? Present, non-brisk Tumor Regression: ? ? Not identifed Lymphovascular Invasion: ? ? Present Microsatellitosis: ? ? Present Margins ? ? ?Peripheral Margins: ? ? Uninvolved Deep Margin: ? ? Uninvolved Stage (AJCC 8th Ed): ? ? pT4a Nx Base of the tongue and vallecula random biopsies 02/20/19: 1. ?TONGUE, LEFT, BASE, BIOPSY: ? ? ? ?- LYMPHOID TISSUE HYPERPLASIA ? ? ?- NEGATIVE FOR MALIGNANCY 2. ?THROAT, VALLECULA, BIOPSY: ? ?  ? - LYMPHOID TISSUE HYPERPLASIA AND FOCAL CHRONIC INFLAMMATION. SEE NOTE. ? ? ?- NEGATIVE FOR MALIGNANCY ? ? ? ? ? Note: Immunostain of Sox-10 to investigate focal pigment is negative, supporting above interpretation. 3. ?THROAT, LEFT VALLECULA, BIOPSY: ? ? ? ?- LYMPHOID TISSUE HYPERPLASIA ? ? ?- NEGATIVE FOR MALIGNANCY 4. ?THROAT, RIGHT VALLECULA, BIOPSY: ? ? ? ? ? ? - LYMPHOID TISSUE HYPERPLASIA AND FOCAL CHRONIC INFLAMMATION ? ? ?- NEGATIVE FOR MALIGNANCY 5. ?TONGUE, RIGHT BASE, BIOPSY: ? ? ? ? ? ? - LYMPHOID TISSUE HYPERPLASIA AND FOCAL CHRONIC INFLAMMATION ? ? ?- NEGATIVE FOR MALIGNANCY Pathology (reviewed at [facility]), collected 01/07/19:DIAGNOSIS: ? 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 Clinical pathological correlation is recommended. MICROSCOPIC DESCRIPTION: There are atypical cells in a nodule in the dermis. The cells are positive with SOX-10 and by report are negative with LCA, Kappa, Lambda, and a cytokeratin. The cells are also positive with S-100 by report.ASSESSMENT/PLAN:Jason Rowland is a 77 y.o. 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 Duanne Moron on 03/03/19 showing a stage IIIC melanoma that was 30 mm thick, positive for LVI and microsatellites with 3/25 positive LNs.  Given the thickness of his primary melanoma and microsatellitosis, we felt that his melanoma-specific survival is closer to a stage IIID:  60% over 10 years with stage IIIC disease versus 24% with stage IIID.  He opted to proceed with the Moderna clinical trial (?A Phase 2 Randomized Study of Adjuvant Immunotherapy with the Personalized Cancer Vaccine [phone] and Pembrolizumab Versus Pembrolizumab Alone After Complete Resection of High-Risk Melanoma?) and  completed 1 year of adjuvant therapy on trial without issues.  He developed an enlarged 1 cm SQ nodule overlying the right scapula noted on scans 07/05/20 s/p excision showing a 1.6 cm LN containing a 11 mm deposit of melanoma.MRI brain 09/18/20 showed a 8 mm left hippocampus lesion and a 6 mm lesion in the left mesial temporal lobe concerning for brain mets.  Reviewed the case with Dr. Winfred Leeds; lesions are too small and ill-defined to biopsy and would hold off on GKRS for now.  Given high suspicion for metastatic disease, recurrence of his disease recently, and fairly good performance status, would opt to be aggressive.  I reviewed with Jason Rowland that ideally, we would want to get a biopsy to establish stage IV disease, but given that we know his disease is aggressive and presented again shortly after discontinuation of adjuvant anti-PD-1, we can presume that this is metastatic disease and start a new line of systemic therapy.  Discussed the available brain met clinical trials, but he wants to move to [facility] and would like to pursue standard of care. Discussed data from [facility] utilizing ipi 3 + nivo 1 in brain metastasis which demonstrated similar intracranial and extracranial efficacy.  Jason Rowland agreed to proceed; thus far tx has been complicated by G1-2 diarrhea which is well controlled with imodium/lomotil. [place] CAP 05/03/21 shows multiple non-specific areas of diffuse enhancement in the b/l lungs which requires f/u but c/f pneumonitis.  Scans from 09/2021 show improvement in ground glass changes.  Most recent scans pending uploading and second read.  - labs reviewed, LDH wnl.- fatigue:  W/u for CK, TSH, troponin, and cortisol.  Trop elevated at 72; needs repeating.  More anemic than prior with Hgb 11.9, normocytic.  Non-specific leukocytosis with neutrophilia without localizing sx.  CK wnl.  EKG wnl.- hypothyroidism:  TSH elevated still at 65.7.  Called him on 12/4.  He had stopped taking his synthroid 50 mcg daily but was able to find the bottle.  I asked him to restart and reviewed proper admin of the med.  Will need f/u TSH in [place] and dose adjustments if needed.- decrease appetite:  Mood is good.  Could be related to hypothyroidism.- scans to be uploaded with 2nd read request- Diarrhea:  Recommended stool studies:  C. Diff, calprotectin, lactoferrin.  Set up with collection supplies to be dropped off at [facility].  If neg, rec GI eval for colonoscopy to w/u for microscopic colitis.  - f/u with Dr Bayard Males of [facility]. [place] [phone] office.  [phone] - fax . I tried to call her office again without success.  Will fax note and cont attempts at outreach.- routine f/u with dermatologist for FBSEs- RTC as needed. Jason Rowland will call and let us know when he will be back in [place] to set up an interim visit. - He knows to call our office for any questions or concerns while in [place]38 min spent in chart review and direct care on the day of the encounter.Electronically Signed by Drema Halon, MD, March 17, 2022

## 2022-03-23 ENCOUNTER — Telehealth: Admit: 2022-03-23 | Payer: PRIVATE HEALTH INSURANCE

## 2022-03-23 NOTE — Telephone Encounter
Call from answering service Still need medical records sent over stat Fax: [phone]

## 2022-03-24 ENCOUNTER — Telehealth: Admit: 2022-03-24 | Payer: PRIVATE HEALTH INSURANCE

## 2022-03-24 NOTE — Telephone Encounter
Returned call to [facility] and lvm requesting call back to discuss request for medical records.

## 2022-04-27 ENCOUNTER — Encounter: Admit: 2022-04-27 | Payer: PRIVATE HEALTH INSURANCE | Attending: Vascular and Interventional Radiology

## 2022-05-23 IMAGING — MR MRI BRAIN W/WO CONTRAST
2 of 16 series · 5 of 48 positions shown · IV contrast (gadavist)
Comparison: Enhanced cranial MRI January 27, 2022

________________________________________________________________________________________________ 
MRI BRAIN W/WO CONTRAST,05/23/2022 [DATE]: 
CLINICAL INDICATION: Malignant melanoma, evaluate for cranial metastasis
TECHNIQUE: Multiplanar, multiecho position MR images of the brain were performed 
without and with 11 mL of Gadavist were injected intravenously. 4 mL of Gadavist 
was discarded.  Patient was scanned on a 1.5T magnet.

[Series 1002: T1 post-contrast · coronal · 1.0mm · 0.24mm/px · 3 of 180 slices shown (1 of 2)]
[im 23/180]
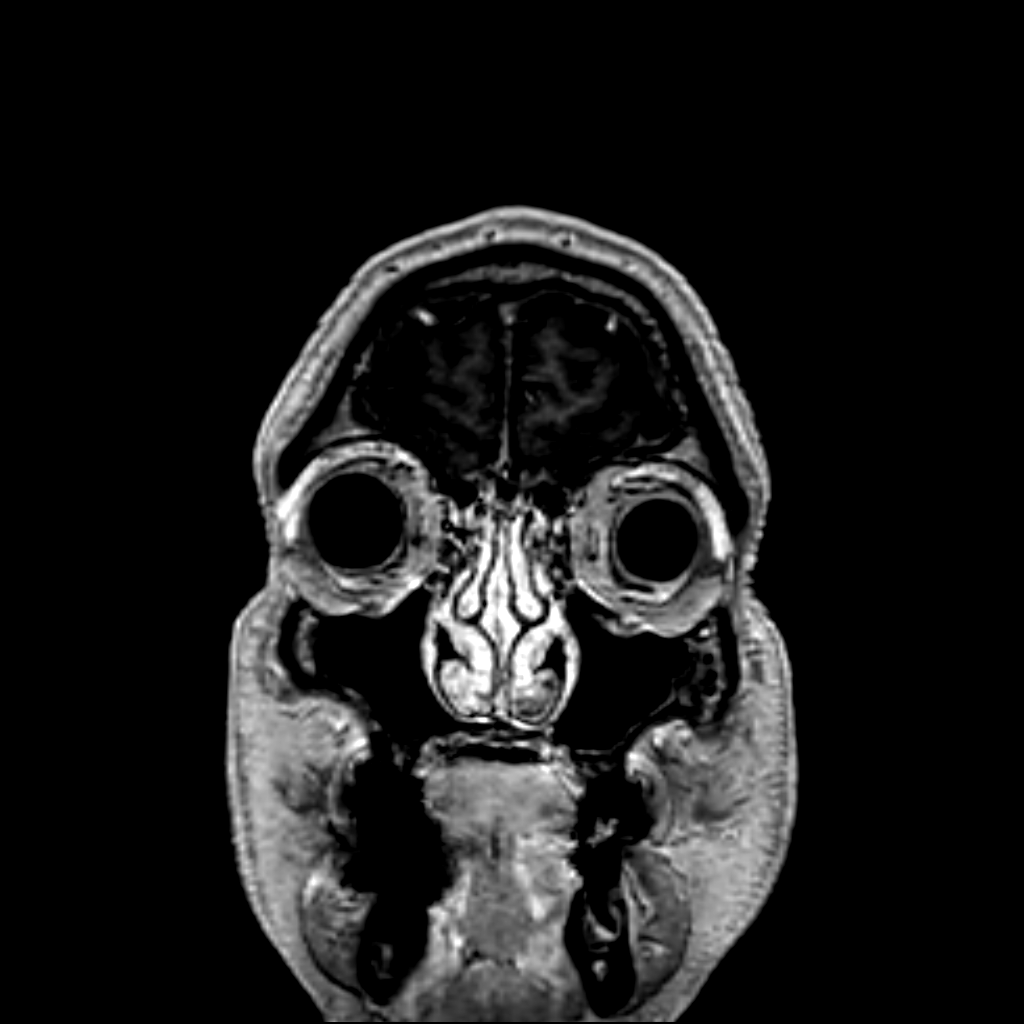
[im 90/180]
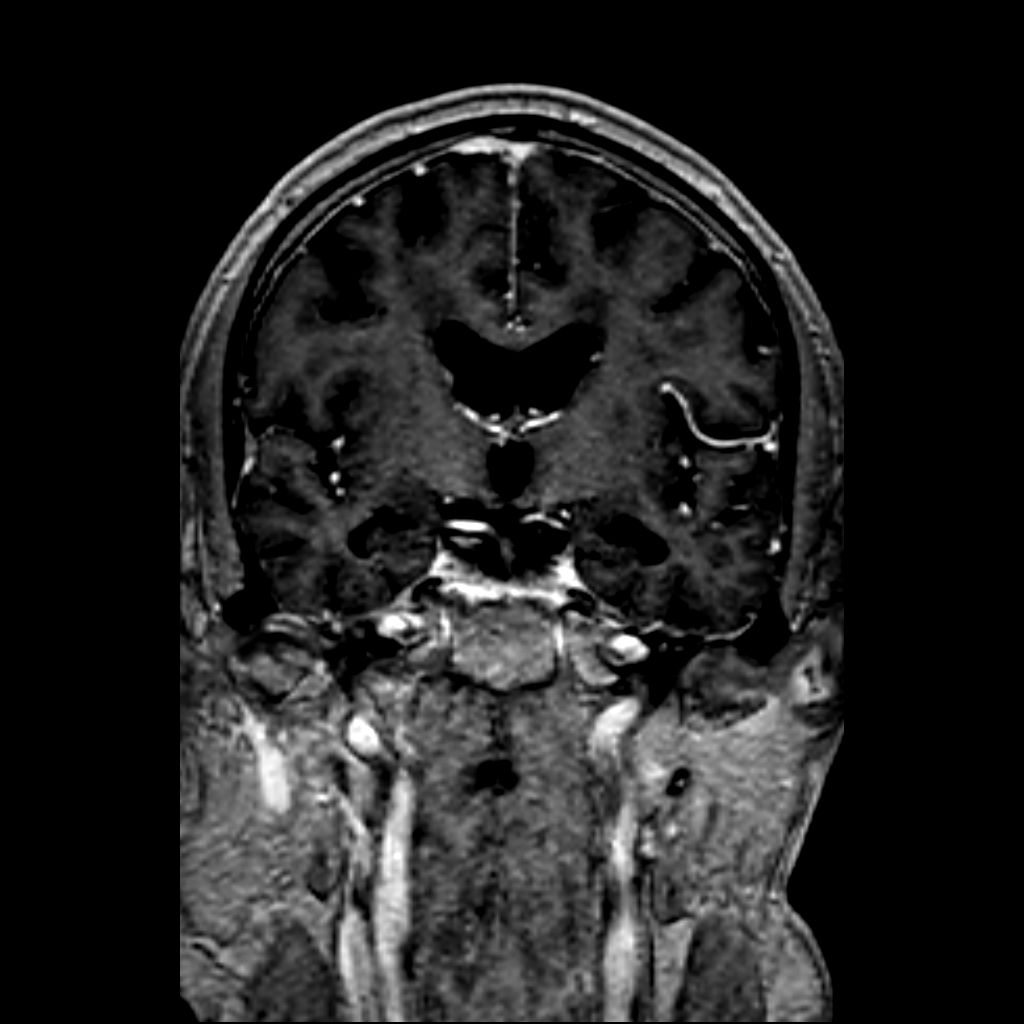
[im 157/180]
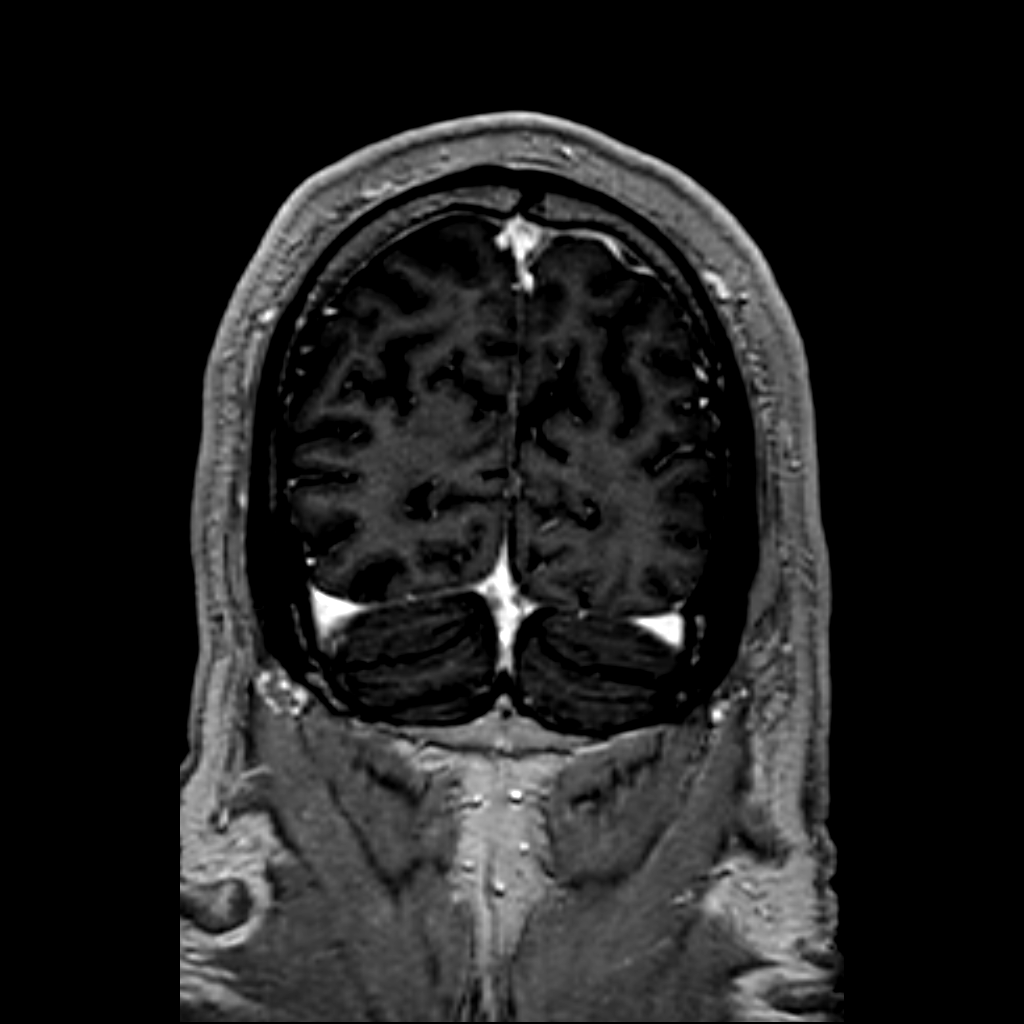

[Series 1003: T1 post-contrast · axial · 1.0mm · 0.24mm/px · z∈[-115,-45]mm · 2 of 170 slices shown (2 of 2)]
[im 25/170]
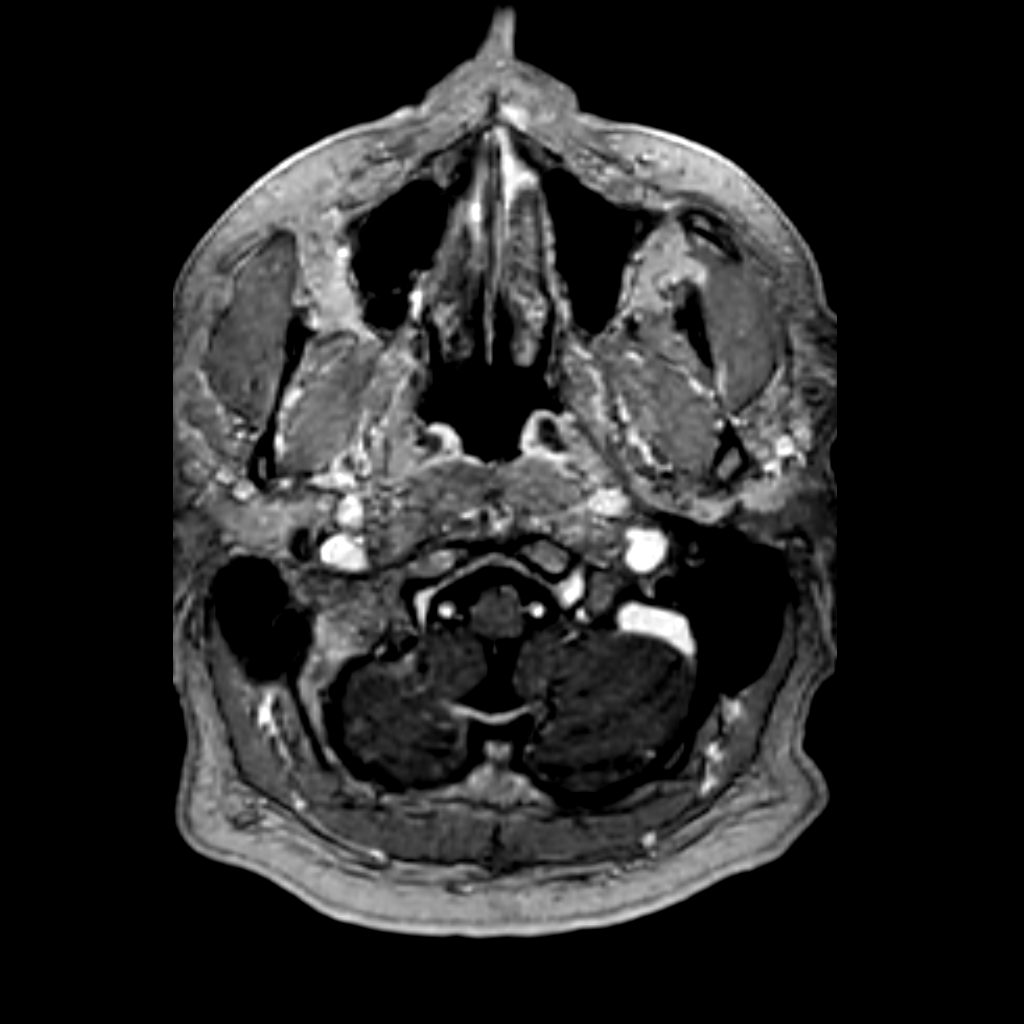
[im 97/170]
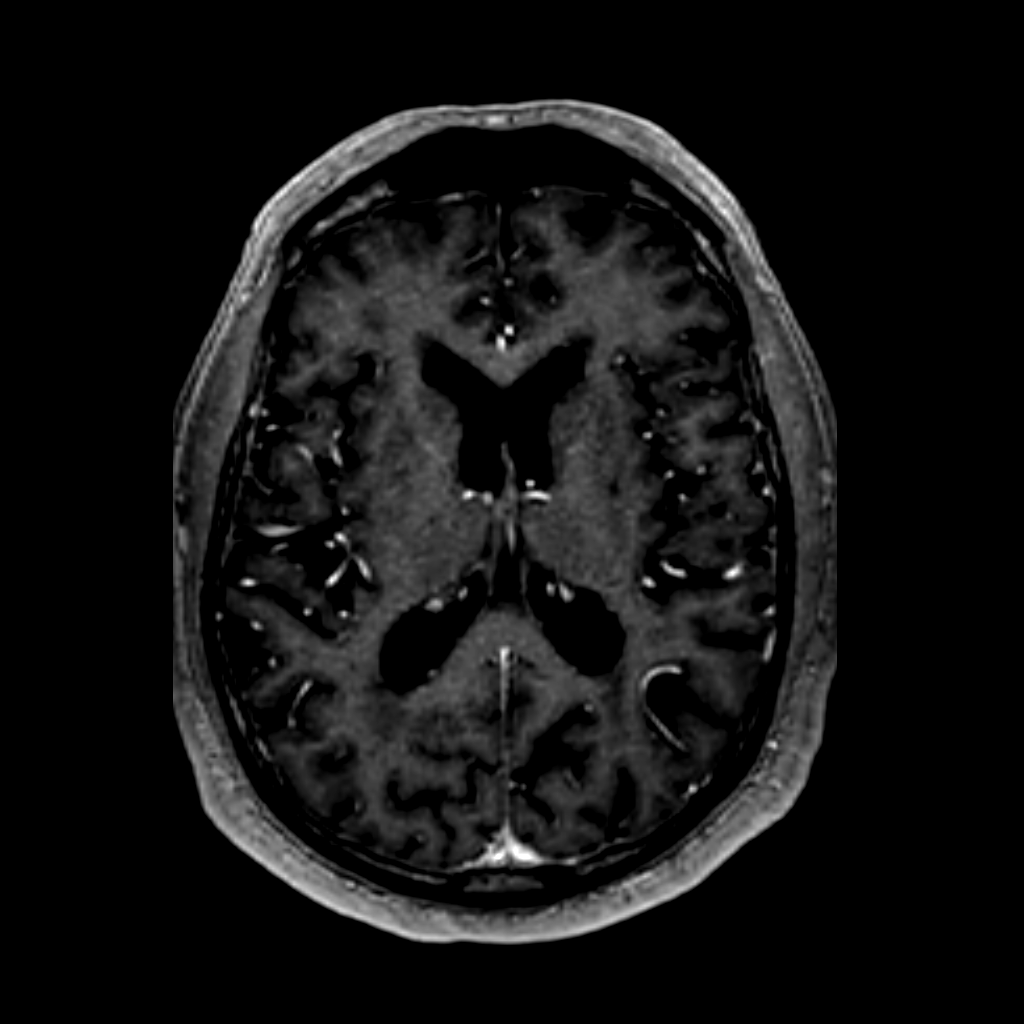

[5 of 48 positions shown; findings below may reference images not displayed]

FINDINGS: No current evidence for recent infarct. Prior study showed a recent 
right frontal corona radiata white matter infarct. 
There is no evidence for intracranial metastasis or other neoplasm. No 
pathologic parenchymal or leptomeningeal enhancement or abnormal parenchymal T1 
hyperintense signal. There are extensive chronic appearing cerebral white matter 
microangiopathic changes. Mild involvement of the left paramedian pons. No 
discrete cerebellar findings. Major arterial segments and dural sinuses are 
open. Susceptibility images are negative. 
There is no orbital mass. Bilateral ocular lens implants. Paranasal sinuses, 
tympanic cavities and mastoid air cells are clear. Craniocervical junction is 
open. Skull base and calvarial marrow signal appear intact. No sellar mass. 
There is mild to moderate cortical atrophy.
IMPRESSION: No evidence for cranial metastasis. 
Extensive cerebral white matter microangiopathic changes. No evidence for recent 
infarct. 
Cervical spondylosis, with mild cord deformity at C3-4.

## 2022-07-18 IMAGING — CT CT CHEST/ABDOMEN AND PELVIS WITH IV AND ORAL CONTRAST
3 of 8 series · 15 of 46 positions shown, 17 images · IV contrast (APPLIED)
Comparison: CT 02/02/2022.

________________________________________________________________________________________________ 
CT CHEST/ABDOMEN AND PELVIS WITH IV AND ORAL CONTRAST, 07/18/2022 [DATE]: 
CLINICAL INDICATION: Melanoma. 
A search for DICOM formatted images was conducted for prior CT imaging studies 
completed at a non-affiliated media free facility.
TECHNIQUE: The chest, abdomen and pelvis were scanned from neck through the 
pubic rami with 75 mL of Isovue 300 MDV contrast on a high-resolution CT scanner 
using dose reduction techniques. 0 mL of Isovue 300 MDV were discarded. Patient 
drank diluted oral contrast for this study. Routine MPR reconstructions were 
performed. Count of known CT and Cardiac Nuclear Medicine studies performed in 
the previous 12 months = 0.

[Series 7: coronal · coronal · 0.59mm/px · 3 of 148 slices shown]
[im 37/148  soft-tissue]
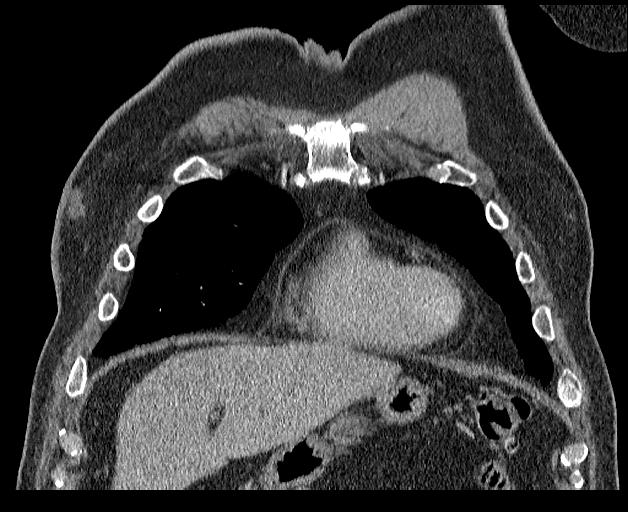
[im 74/148  soft-tissue]
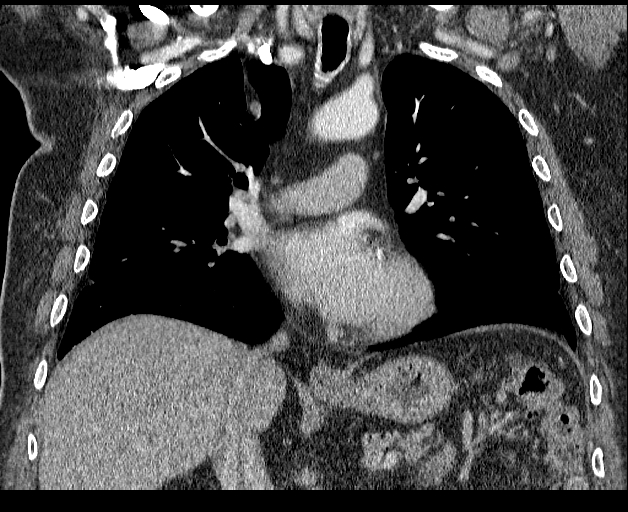
[im 111/148  soft-tissue]
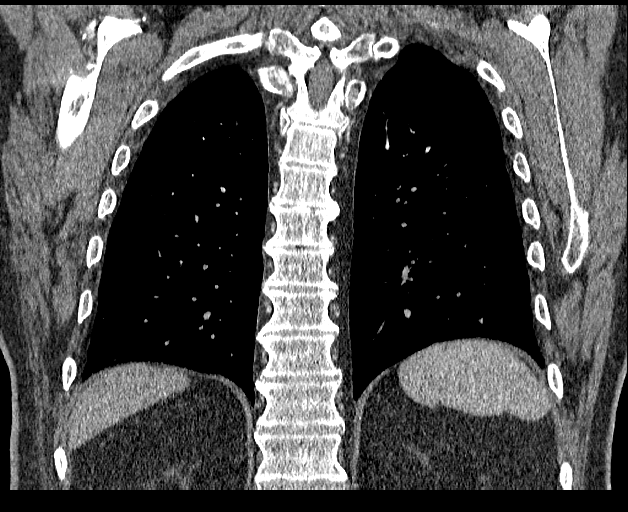

[Series 9: portal 3.0 i41s 2 · axial · portal-venous · 0.82mm/px · z∈[-704,-221]mm · 9 of 203 slices shown, 11 images]
[im 21/203  soft-tissue]
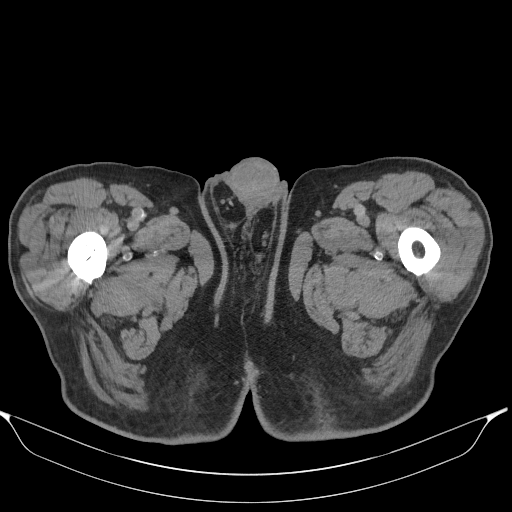
[im 21/203  bone]
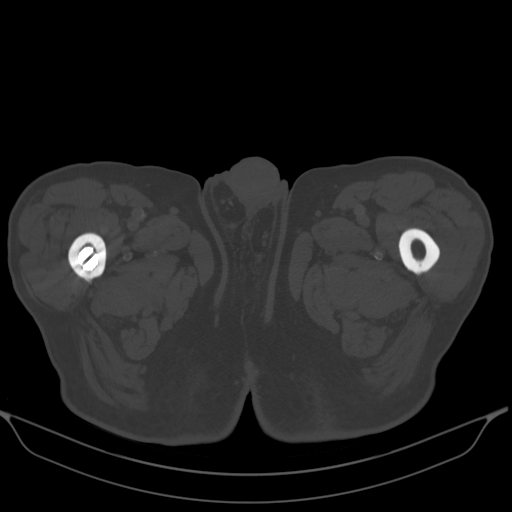
[im 41/203  soft-tissue]
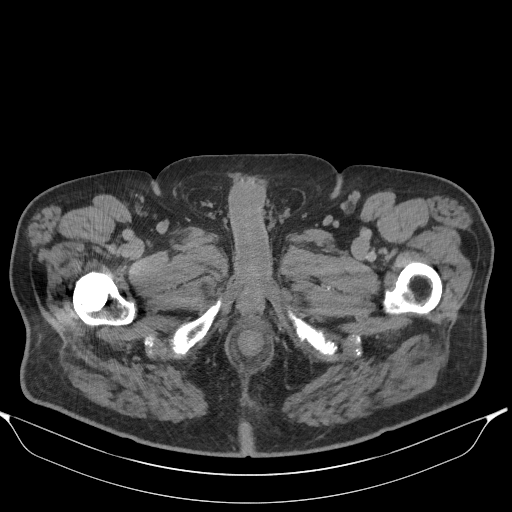
[im 61/203  soft-tissue]
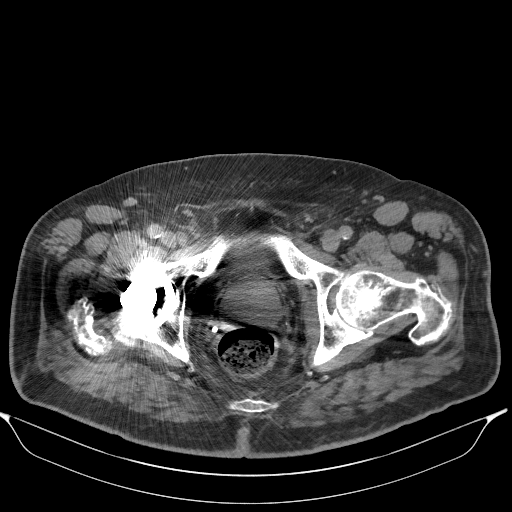
[im 81/203  soft-tissue]
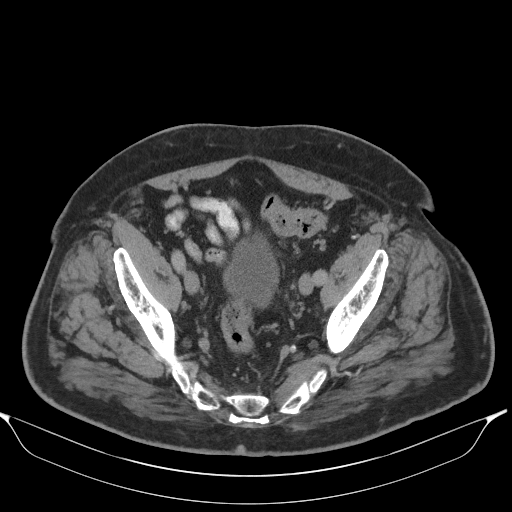
[im 102/203  soft-tissue]
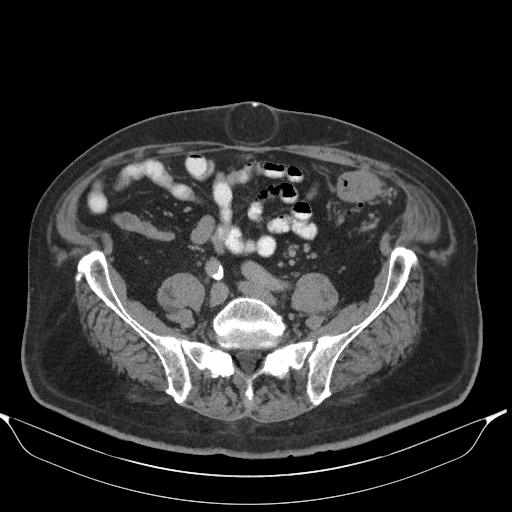
[im 122/203  soft-tissue]
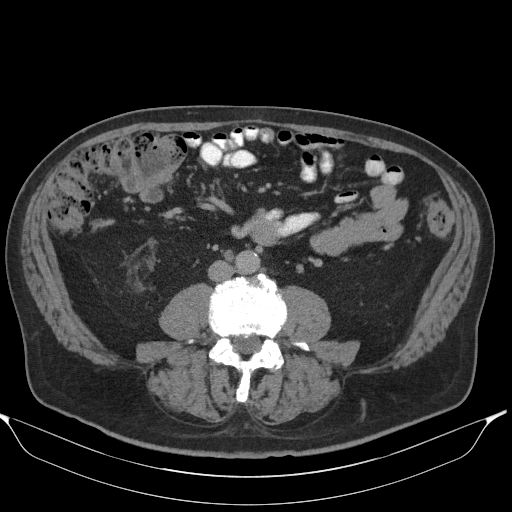
[im 142/203  soft-tissue]
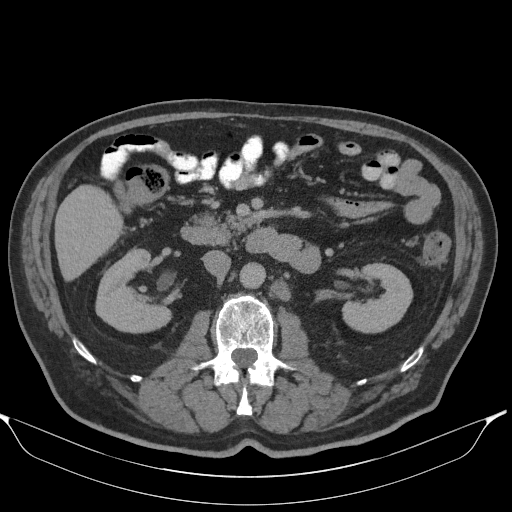
[im 162/203  soft-tissue]
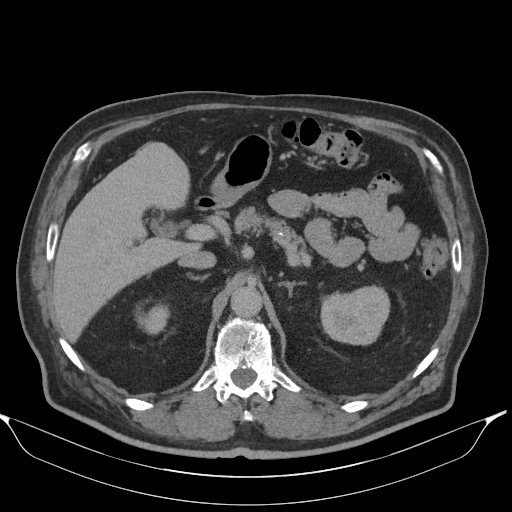
[im 182/203  soft-tissue]
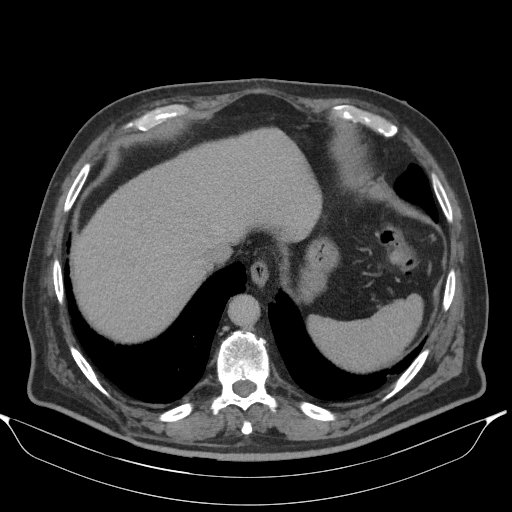
[im 182/203  bone]
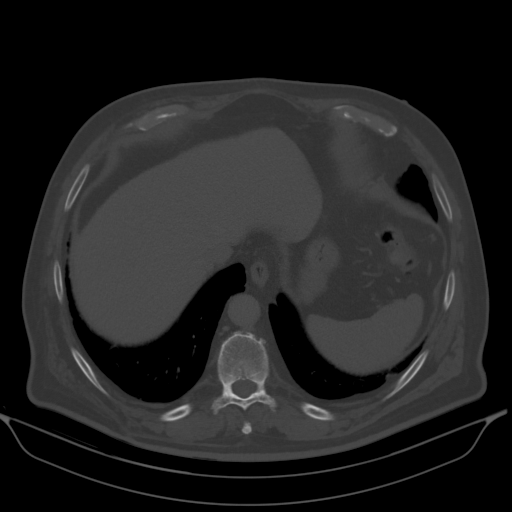

[Series 12: portal 3.0 i41s 2 (person_name) · axial · portal-venous · 0.82mm/px · z∈[-704,-584]mm · 3 of 203 slices shown]
[im 21/203  soft-tissue]
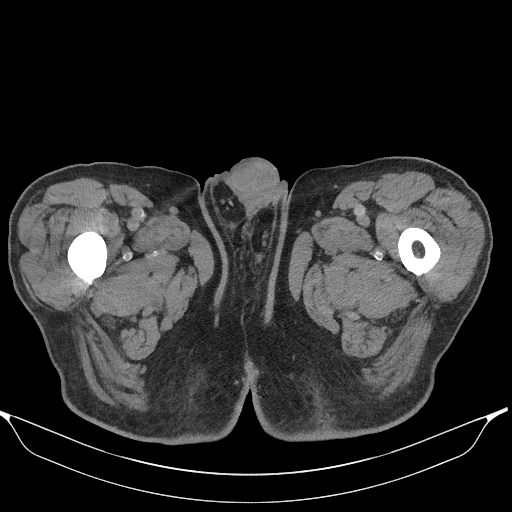
[im 41/203  soft-tissue]
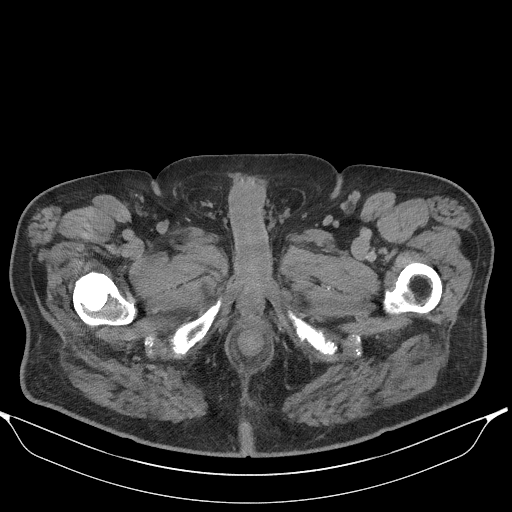
[im 61/203  soft-tissue]
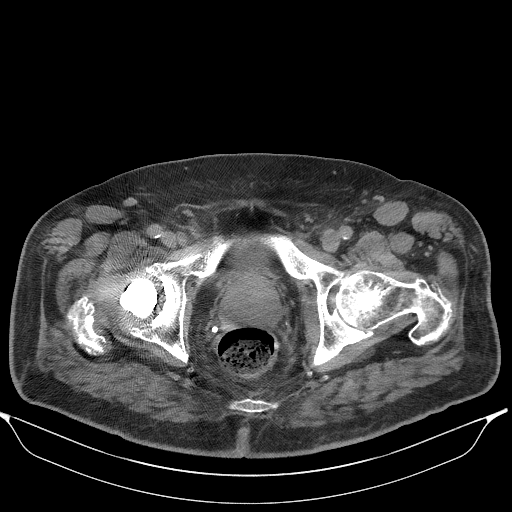

[15 of 46 positions shown; findings below may reference images not displayed]

FINDINGS: LUNGS AND PLEURA:  Waxing and waning of small groundglass densities are stable. 
No new suspicious pulmonary nodule. No acute consolidation or effusion.  
MEDIASTINUM:  No adenopathy. Normal heart size. No pericardial effusion. 
Moderate coronary artery calcification. 
CHEST WALL/AXILLA: No mass or adenopathy.  
HEPATOBILIARY: No evidence for mass or biliary dilatation. Layering small 
gallstones in the gallbladder without inflammatory changes. 
SPLEEN: Normal in size. 
PANCREAS: No ductal dilatation or mass.   
ADRENALS: No mass. 
GENITOURINARY: Bilateral nonobstructing calculi with largest calculus inferior 
pole of the right kidney measuring up to 5 mm .No enhancing mass or 
hydronephrosis. No bladder mass. 
LYMPH NODES: No adenopathy. 
STOMACH, SMALL BOWEL AND COLON: No bowel wall thickening or obstruction. Stomach 
is decompressed and difficult to assess. Umbilical hernia containing fat. There 
is diverticulosis of the descending and sigmoid colon without active 
diverticulitis. 
VASCULAR STRUCTURES: No aneurysm.  
MUSCULOSKELETAL: No acute osseous abnormality. Scattered degenerative changes.  
ADDITIONAL FINDINGS: Right hip prosthesis.
IMPRESSION: Stable waxing and waning of small groundglass densities in both lungs. No new 
pulmonary nodules. 
No new thoracic, abdominal or pelvic adenopathy. 
Cholelithiasis. 
Small nonobstructing bilateral renal calculi. 
RADIATION DOSE REDUCTION: All CT scans are performed using radiation dose 
reduction techniques, when applicable.  Technical factors are evaluated and 
adjusted to ensure appropriate moderation of exposure.  Automated dose 
management technology is applied to adjust the radiation doses to minimize 
exposure while achieving diagnostic quality images.

## 2022-08-24 IMAGING — MR MRI CERVICAL SPINE WITHOUT CONTRAST
5 of 7 series · 21 of 48 positions shown · IV contrast (gadolinium)
Comparison: None

________________________________________________________________________________________________ 
MRI CERVICAL SPINE WITHOUT CONTRAST, 08/24/2022 [DATE]: 
CLINICAL INDICATION: Status post fall June 13, 2022, right-sided neck pain
TECHNIQUE: Multiplanar, multiecho position MR images of the cervical spine were 
performed without intravenous gadolinium enhancement. Patient was scanned on a 
1.5T magnet.

[Series 101: survey · axial · 10.0mm · 1.25mm/px · z∈[-6,+200]mm · 3 of 10 slices shown]
[im 1/10]
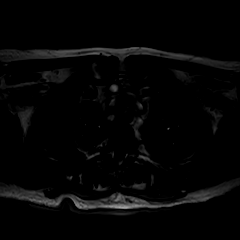
[im 5/10]
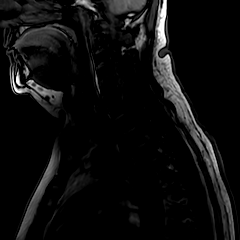
[im 10/10]
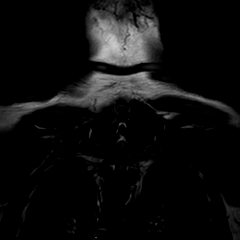

[Series 201: t2w_cor-surv · coronal · 5.0mm · 0.69mm/px · 2 of 7 slices shown]
[im 1/7]
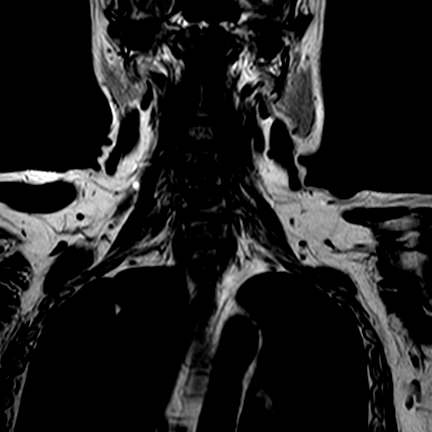
[im 7/7]
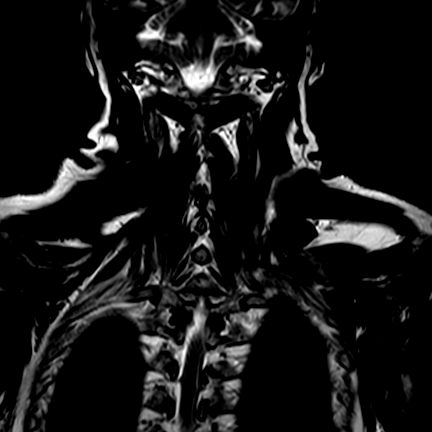

[Series 301: T1 · sagittal · 3.0mm · 0.39mm/px · 5 of 15 slices shown]
[im 1/15]
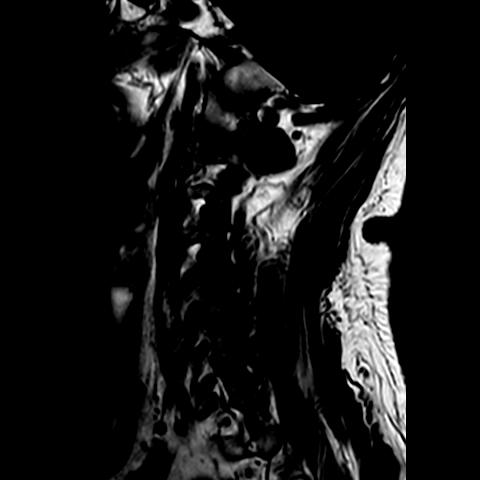
[im 4/15]
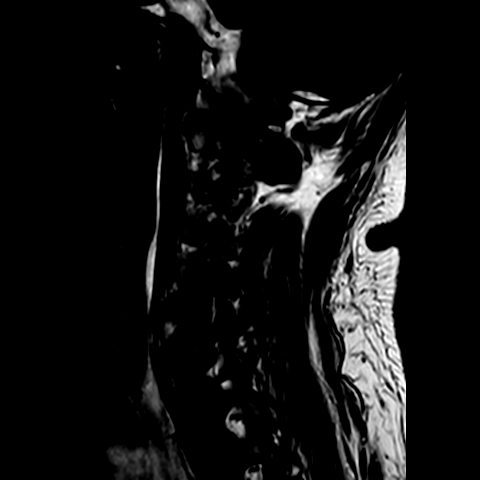
[im 8/15]
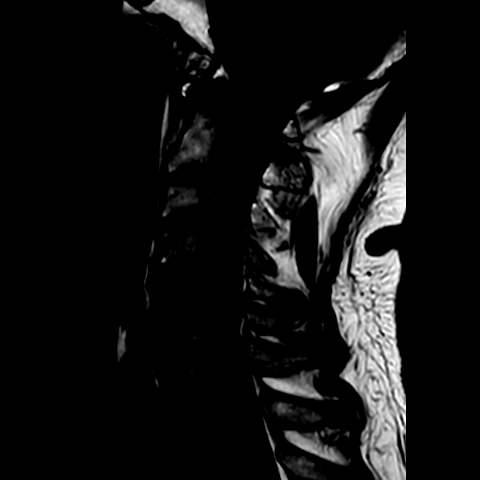
[im 11/15]
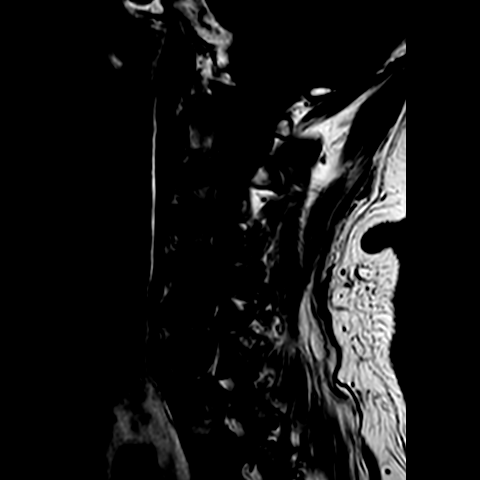
[im 15/15]
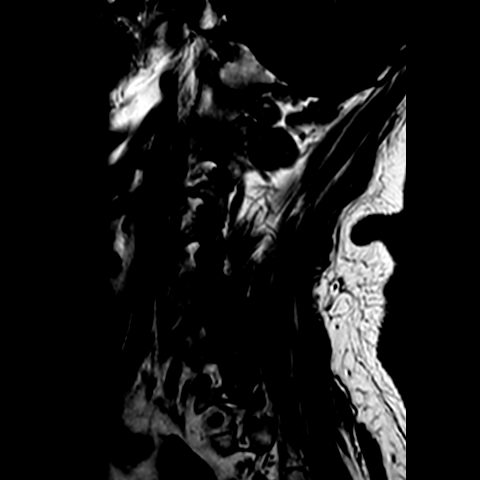

[Series 402: (id)_mdixon_tse · sagittal · 3.0mm · 0.35mm/px · 2 of 15 slices shown]
[im 1/15]
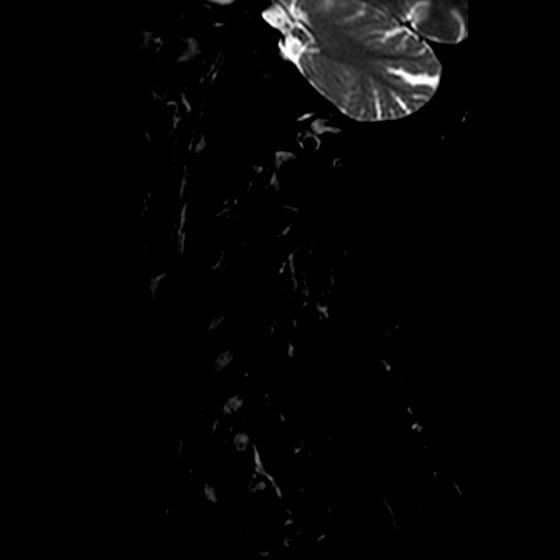
[im 3/15]
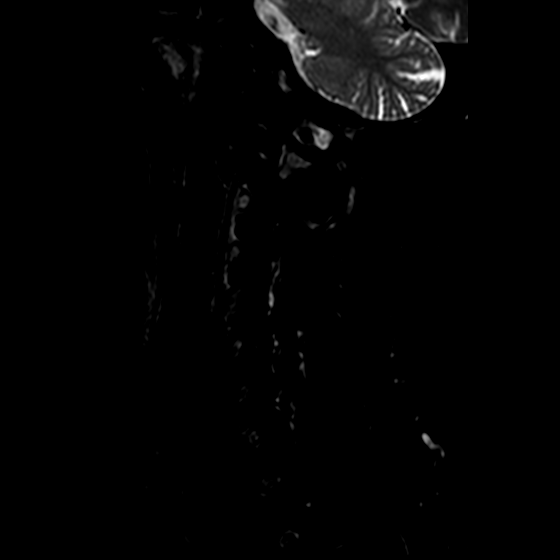

[Series 601: T2 · axial · 3.0mm · 0.31mm/px · z∈[+10,+113]mm · 9 of 34 slices shown]
[im 1/34]
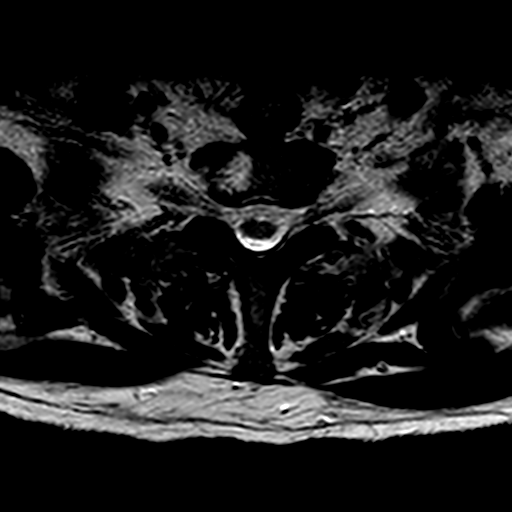
[im 6/34]
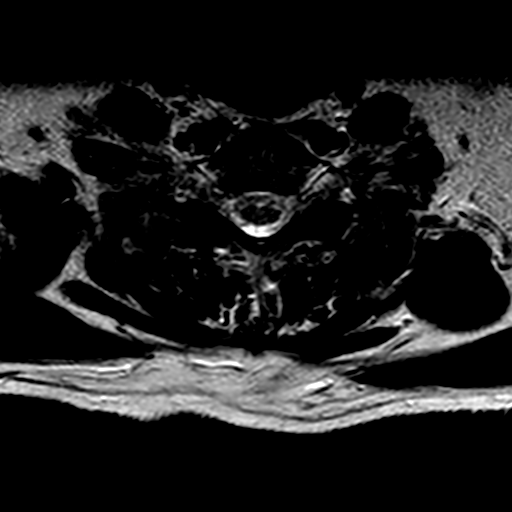
[im 12/34]
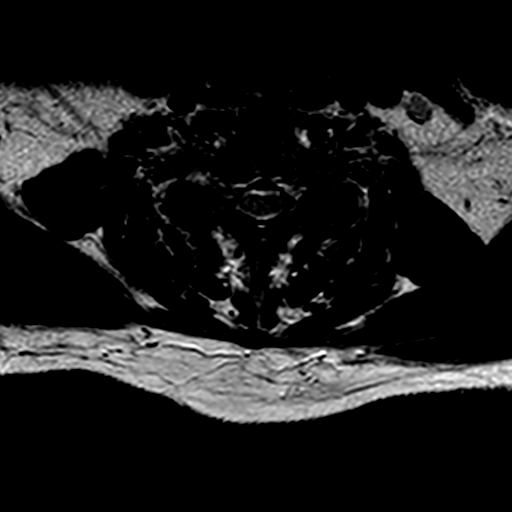
[im 14/34]
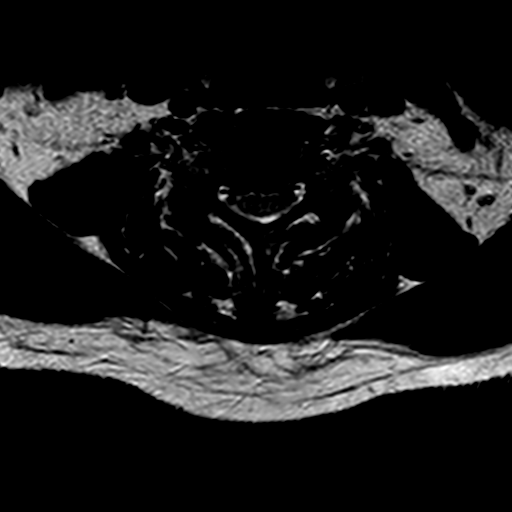
[im 17/34]
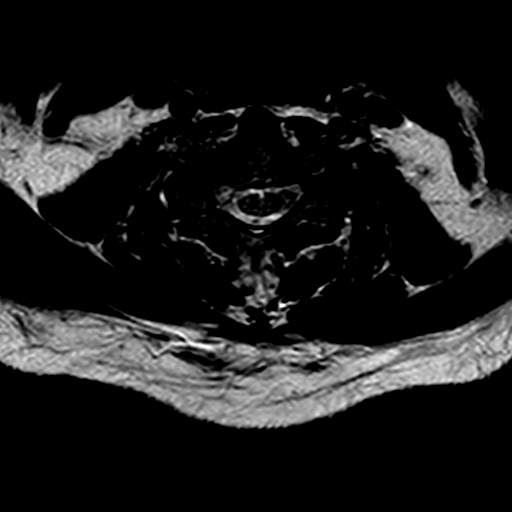
[im 20/34]
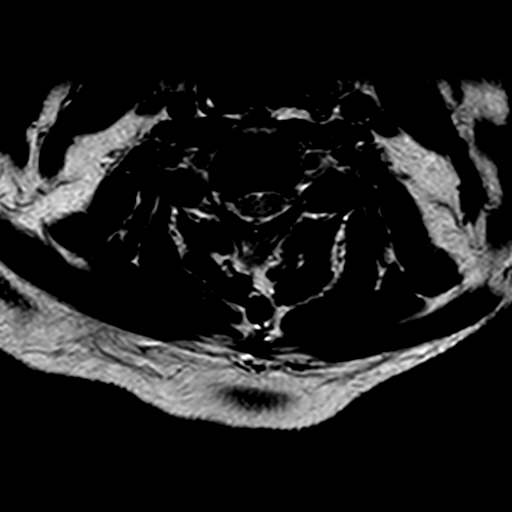
[im 23/34]
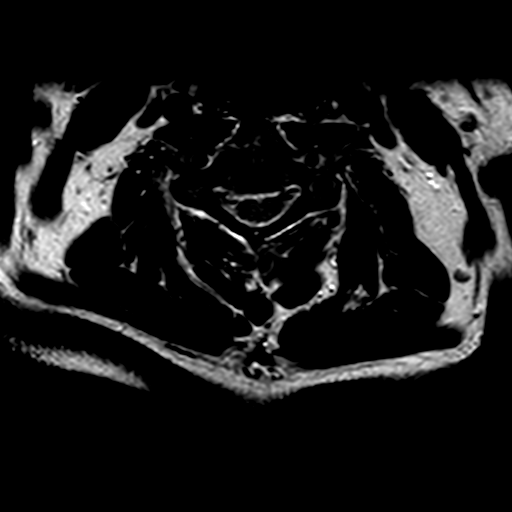
[im 28/34]
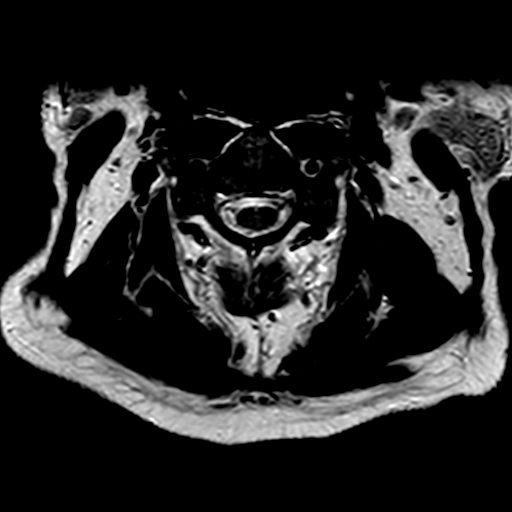
[im 34/34]
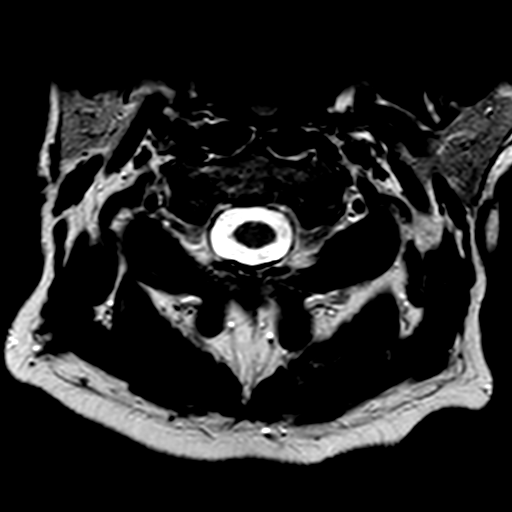

[21 of 48 positions shown; findings below may reference images not displayed]

FINDINGS: . 
Todays study mildly limited by motion artifact. 
Cervical vertebral heights are intact. Marked disc narrowing at C5-6 and C6-7. 
There are developmentally short posterior elements in the cervical canal. 
The dens is intact. The craniocervical junction is open. No evidence for 
malignancy. Hemangioma in the T1 vertebral segment. Mild to moderate cervical 
thoracic levoscoliosis. Modic type I change at C5-6 and C6-7. 
At C2-3 the canal and foramina are open. 
At C3-4 there is broad-based disc bulge/extrusion, slightly effacing the ventral 
cord. Canal diameter 7 mm, mild-to-moderate stenosis. There is mild right, 
minimal left foraminal narrowing. 
At C4-5 there is mild disc bulge touching the cord without deformity, canal 
diameter 9 mm, mildly stenotic. There is moderate to marked right foraminal 
stenosis encroaching on the right C5 nerve root. Left foramen open. 
At C5-6 there is disc-osteophyte touching the cord without deformity. Canal 
diameter 8 mm, mildly stenotic. There is marked right, mild left foraminal 
stenosis, axial image 16, appearing to impinge the right C6 nerve root. 
At C6-7 there is disc-osteophyte touching the cord without deformity, canal 
diameter 8 mm, mild stenosis. There is moderate to marked left, moderate right 
foraminal stenosis. 
At C7-T1 the canal and foramina are open.
IMPRESSION: Spondylosis. There is slight cord deformity at C3-4 by broad-based disc 
bulge/extrusion, with mild to moderate canal stenosis. 
Disc-osteophyte touches the cord at C4-5, C5-6 and C6-7 without deformity. 
There is multilevel foraminal stenosis, appearing most pronounced on the right 
at C4-5 and C5-6, on the left at C6-7. 
There is mild motion artifact limits detail. No cord signal abnormality. There 
is mild Modic type I change at C5-6 and C6-7. 
Mild to moderate cervical thoracic levoscoliosis.

## 2022-11-21 IMAGING — CT CT CHEST/ABDOMEN AND PELVIS WITH IV AND ORAL CONTRAST
3 of 8 series · 14 of 46 positions shown, 16 images · IV contrast (APPLIED)
Comparison: CT 07/18/2022.

________________________________________________________________________________________________ 
CT CHEST/ABDOMEN AND PELVIS WITH IV AND ORAL CONTRAST, 11/21/2022 [DATE]: 
CLINICAL INDICATION: Diarrhea, unspecified, Encounter for antineoplastic 
immunotherapy, Malignant melanoma of skin, unspecified, Type 2 diabetes mellitus 
with diabetic polyneuropathy 
A search for DICOM formatted images was conducted for prior CT imaging studies 
completed at a non-affiliated media free facility.
TECHNIQUE: The chest, abdomen and pelvis were scanned from neck through the 
pubic rami with 75 mL of Isovue 300 MDV contrast on a high-resolution CT scanner 
using dose reduction techniques. Patient drank diluted 2 mL of Gastrografin. 28 
mL of Gastrografin 30 ml discarded. Routine MPR reconstructions were performed. 
The patients eGFR was calculated to be 59.0 mL/min/1.73 m2 using the i-STAT 
device.

[Series 4: chest with 2.0 i31s 3 · axial · 0.90mm/px · z∈[-278,-234]mm · 2 of 153 slices shown]
[im 22/153  soft-tissue]
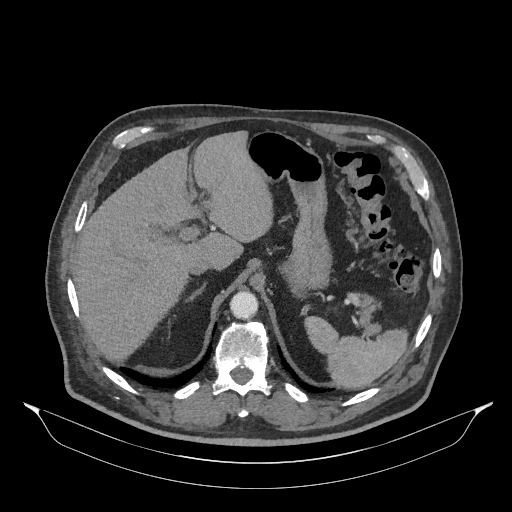
[im 44/153  soft-tissue]
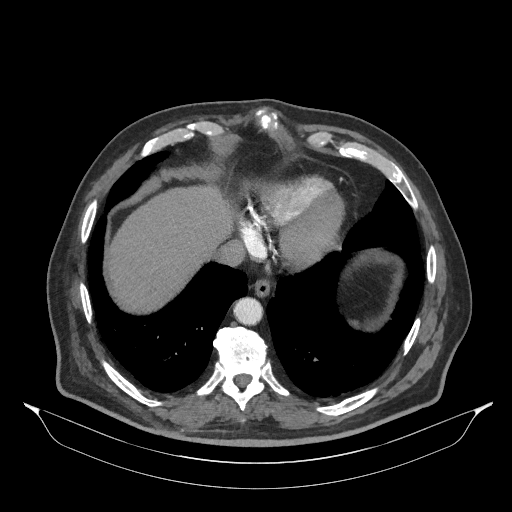

[Series 7: coronal · coronal · 0.61mm/px · 3 of 150 slices shown]
[im 38/150  soft-tissue]
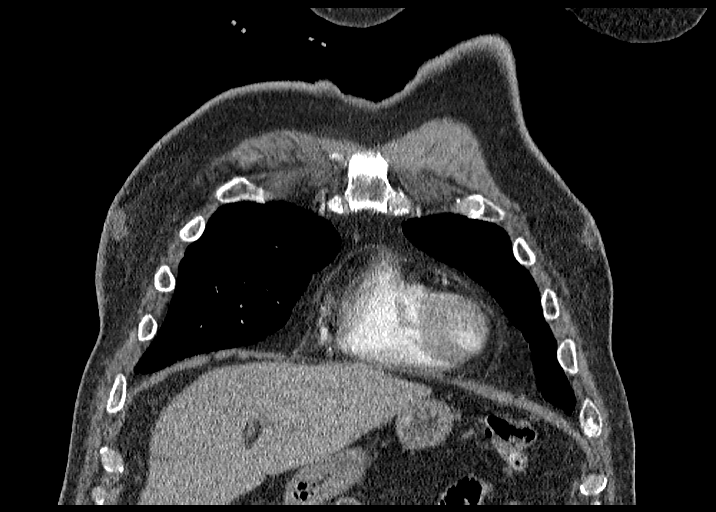
[im 75/150  soft-tissue]
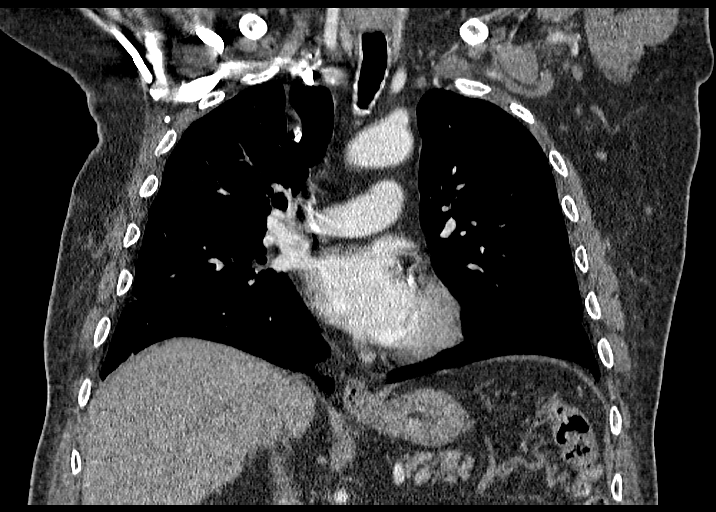
[im 112/150  soft-tissue]
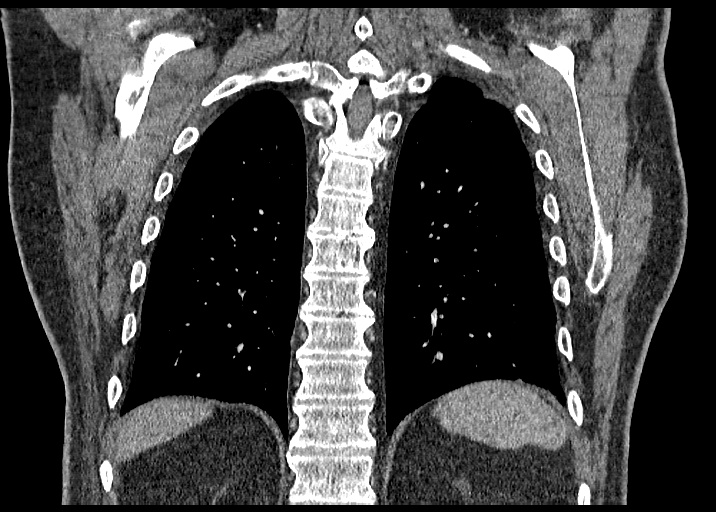

[Series 9: portal 3.0 i41s 2 · axial · portal-venous · 0.86mm/px · z∈[-738,-242]mm · 9 of 207 slices shown, 11 images]
[im 21/207  soft-tissue]
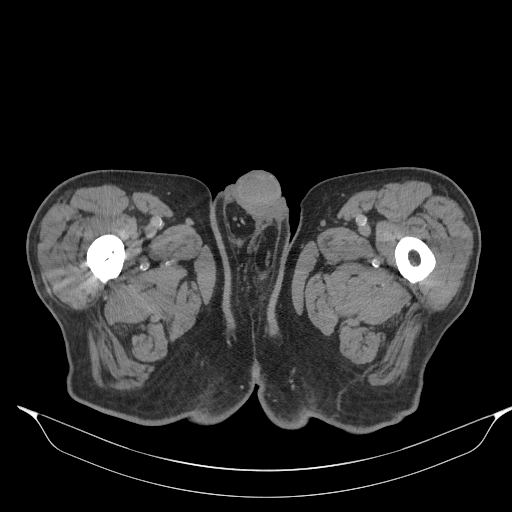
[im 21/207  bone]
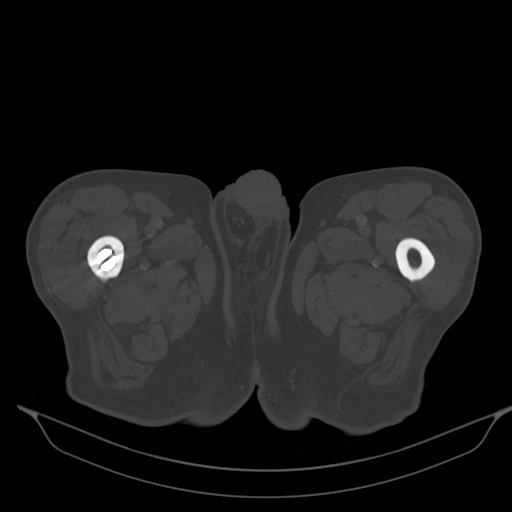
[im 42/207  soft-tissue]
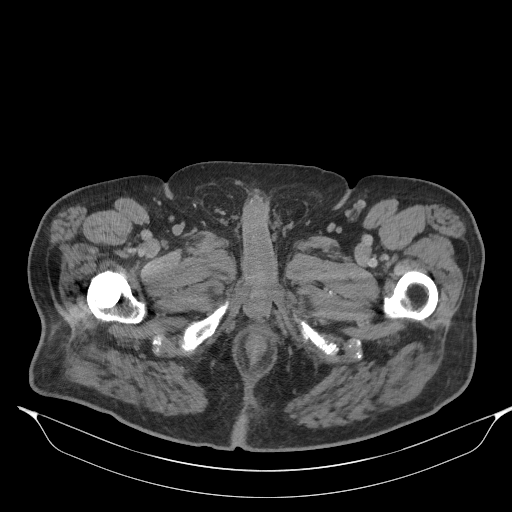
[im 62/207  soft-tissue]
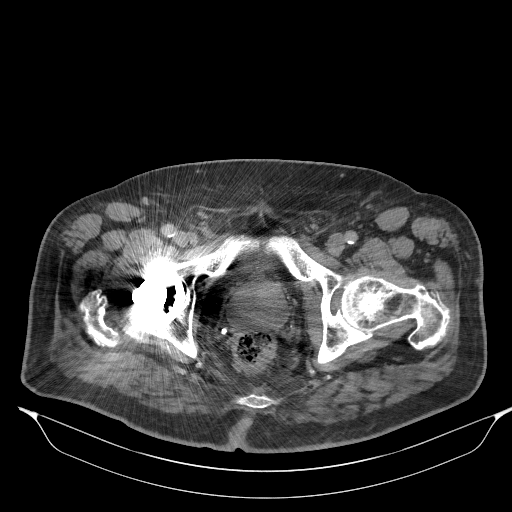
[im 83/207  soft-tissue]
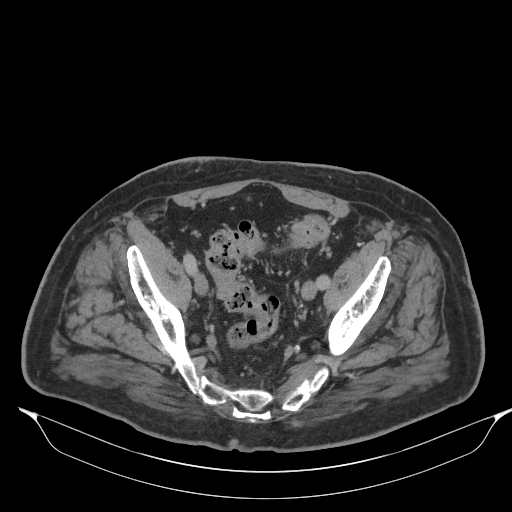
[im 104/207  soft-tissue]
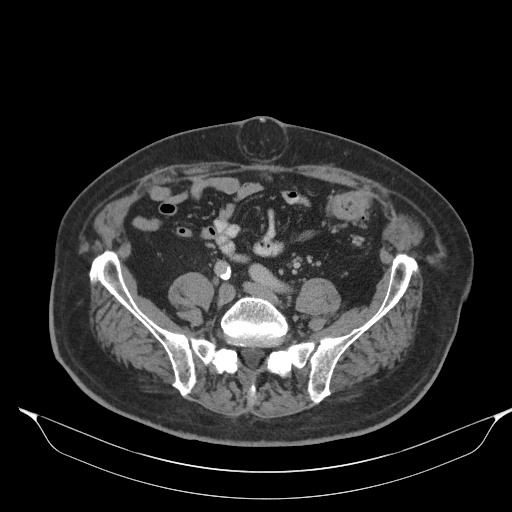
[im 124/207  soft-tissue]
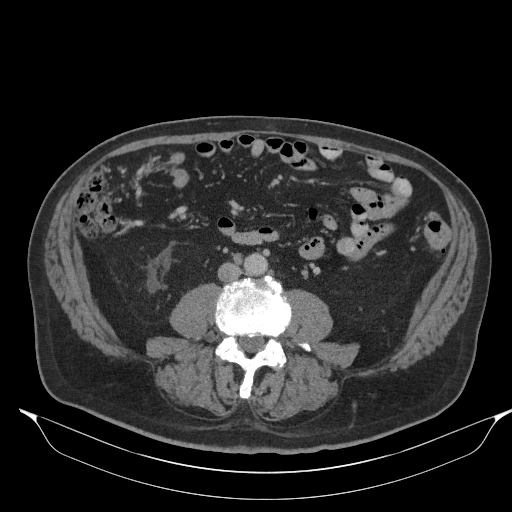
[im 145/207  soft-tissue]
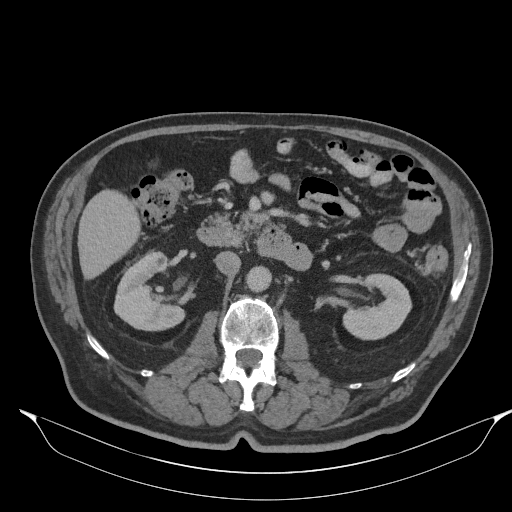
[im 165/207  soft-tissue]
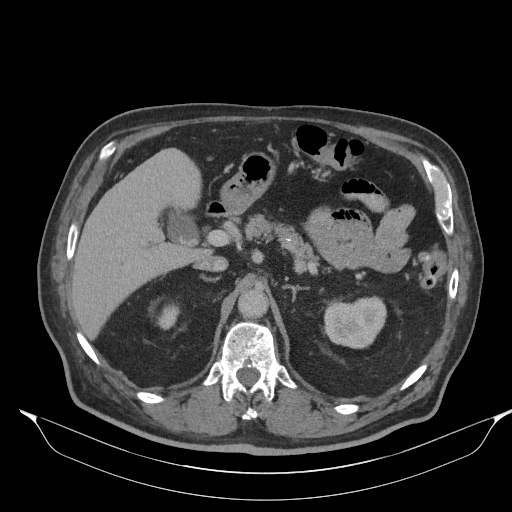
[im 186/207  soft-tissue]
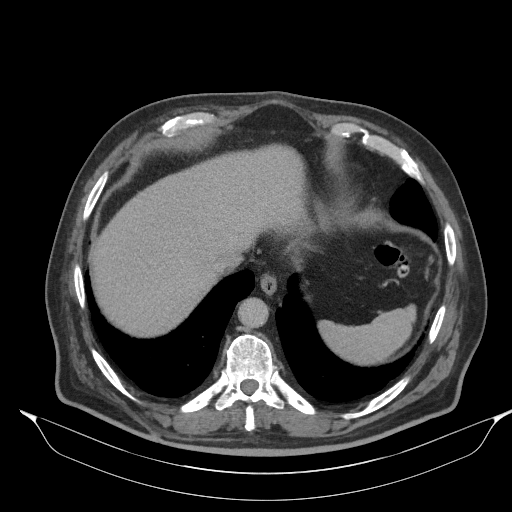
[im 186/207  bone]
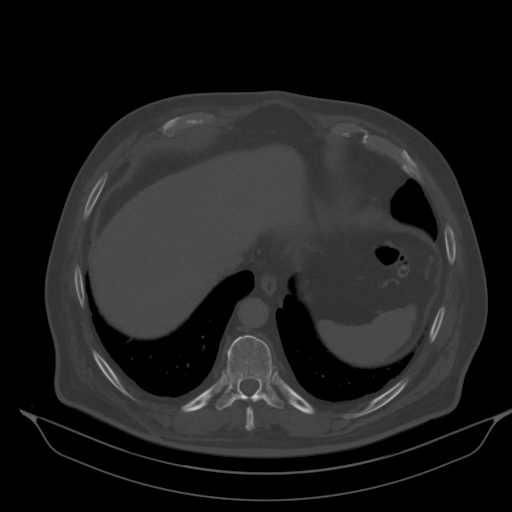

[14 of 46 positions shown; findings below may reference images not displayed]

Count of known CT and Cardiac Nuclear Medicine studies performed in the previous 
12 months = 4.
FINDINGS: LUNGS AND PLEURA:  Waxing and waning of small groundglass densities have 
decreased in number compared previous exam.. No new suspicious pulmonary nodule. 
No acute consolidation or effusion.  
MEDIASTINUM:  No adenopathy. Normal heart size. No pericardial effusion. 
Moderate coronary artery calcification. 
CHEST WALL/AXILLA: No mass or adenopathy.  
HEPATOBILIARY: No evidence for mass or biliary dilatation. Cholelithiasis but no 
gallbladder wall thickening or pericholecystic fluid. 
SPLEEN: Normal in size. 
PANCREAS: No ductal dilatation or mass.   
ADRENALS: No mass. 
GENITOURINARY: Bilateral nonobstructing calculi with largest calculus inferior 
pole of the right kidney measuring up to 5 mm .No enhancing mass or 
hydronephrosis. No bladder mass. 
LYMPH NODES: No adenopathy. 
STOMACH, SMALL BOWEL AND COLON: No bowel wall thickening or obstruction. Stomach 
is decompressed and difficult to assess. Umbilical hernia containing fat. There 
is diverticulosis of the descending and sigmoid colon without active 
diverticulitis. 
VASCULAR STRUCTURES: No aneurysm.  
MUSCULOSKELETAL: No acute osseous abnormality. Scattered degenerative changes.  
ADDITIONAL FINDINGS: Right hip prosthesis.
IMPRESSION: Improvement in number of waxing and waning of small groundglass densities in 
both lungs. No new pulmonary nodules. 
No new thoracic, abdominal or pelvic adenopathy. 
Cholelithiasis. 
Small nonobstructing bilateral renal calculi. 
In patients between the ages of 50-77 where pulmonary emphysema is noted on CT, 
recommend evaluation for low dose lung cancer screening protocol if patient is 
not already enrolled; as pulmonary emphysema is an independent risk factor for 
lung cancer. 
RADIATION DOSE REDUCTION: All CT scans are performed using radiation dose 
reduction techniques, when applicable.  Technical factors are evaluated and 
adjusted to ensure appropriate moderation of exposure.  Automated dose 
management technology is applied to adjust the radiation doses to minimize 
exposure while achieving diagnostic quality images.

## 2023-01-01 IMAGING — MR MRI BRAIN W/WO CONTRAST
2 of 17 series · 5 of 48 positions shown · IV contrast (gadavist)
Comparison: MRI brain from May 23, 2022. Additional MRI examinations dating 
back to September 28, 2021.

________________________________________________________________________________________________ 
MRI BRAIN W/WO CONTRAST, 01/01/2023 [DATE]: 
CLINICAL INDICATION: Malignant Melanoma Of Skin, Unspecified
TECHNIQUE: Multiplanar, multiecho position MR images of the brain were performed 
without and with 10 mL of Gadavist were injected intravenously by hand. Patient 
was scanned on a 1.5T magnet.

[Series 1002: T1 post-contrast · coronal · 1.0mm · 0.24mm/px · 3 of 180 slices shown (1 of 2)]
[im 30/180]
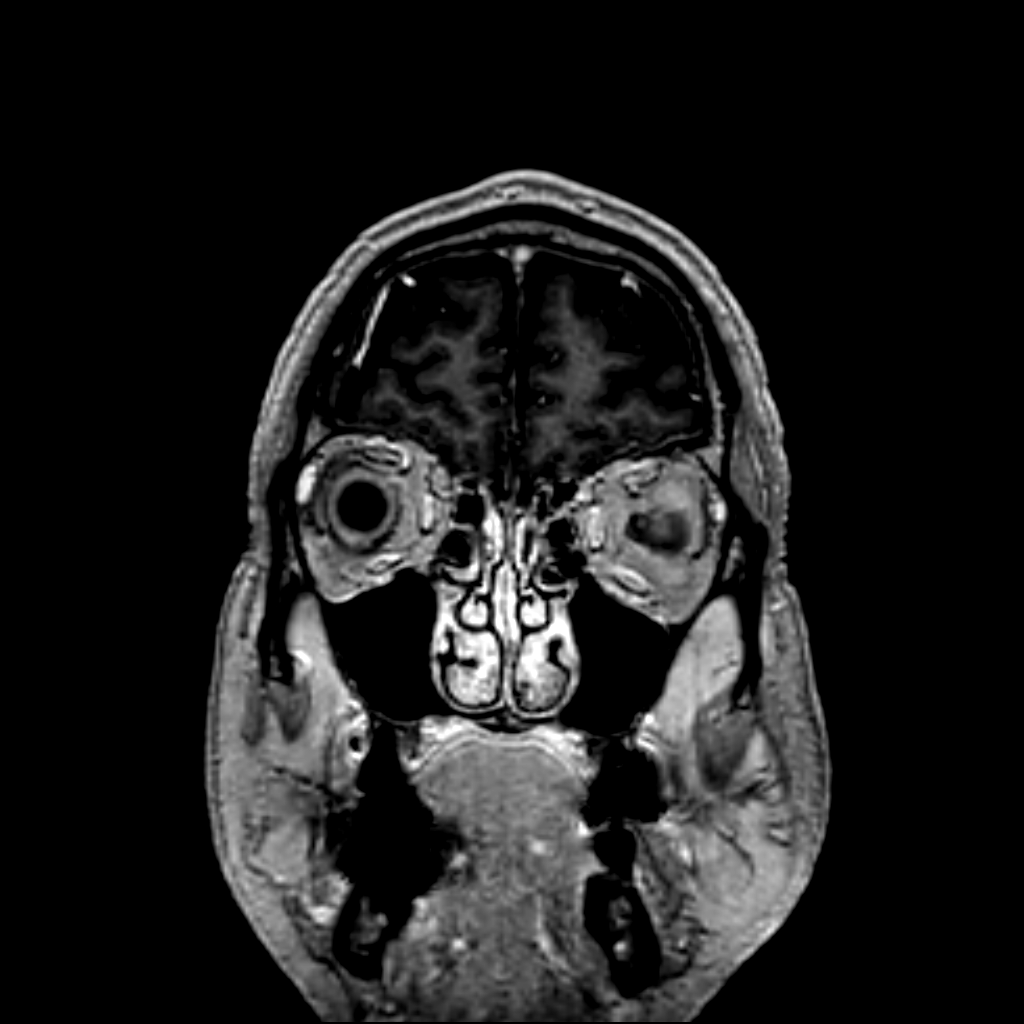
[im 90/180]
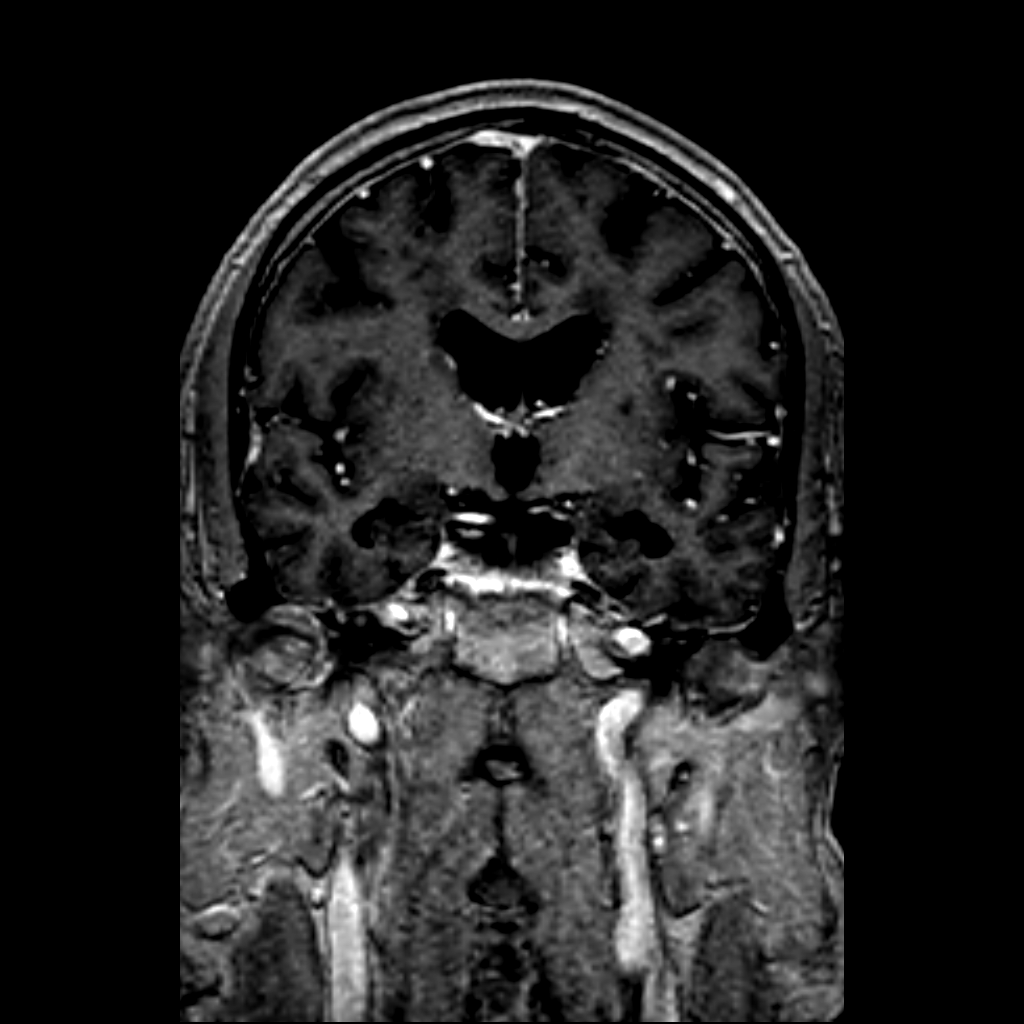
[im 150/180]
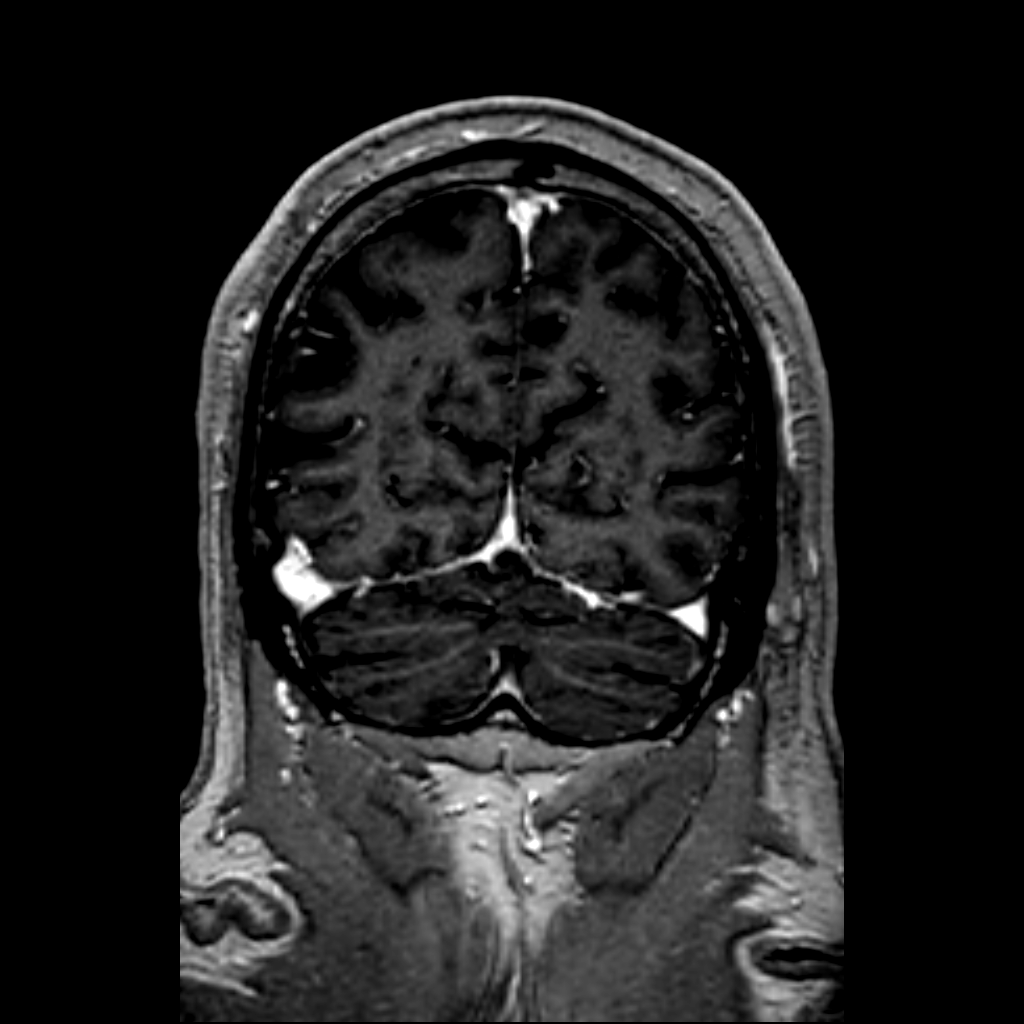

[Series 1003: T1 post-contrast · axial · 1.0mm · 0.24mm/px · z∈[-116,-61]mm · 2 of 170 slices shown (2 of 2)]
[im 29/170]
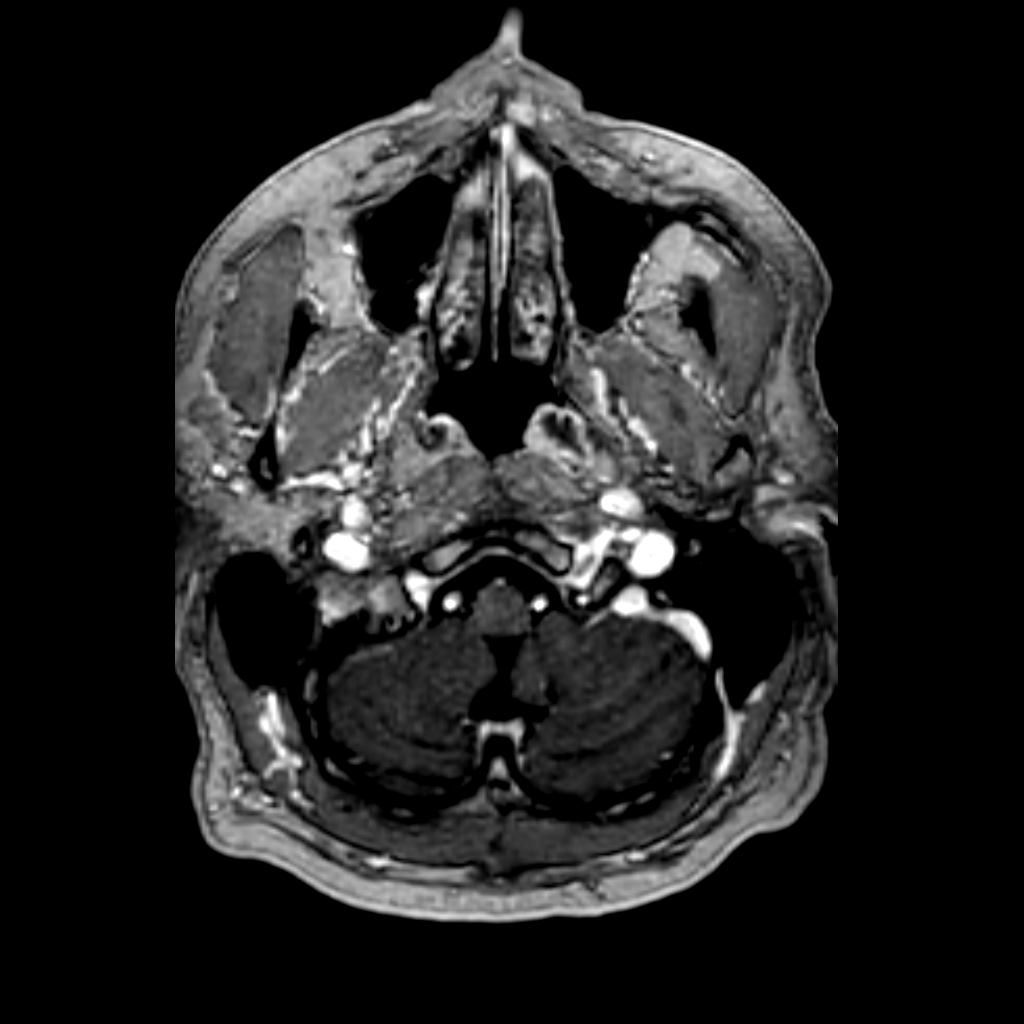
[im 85/170]
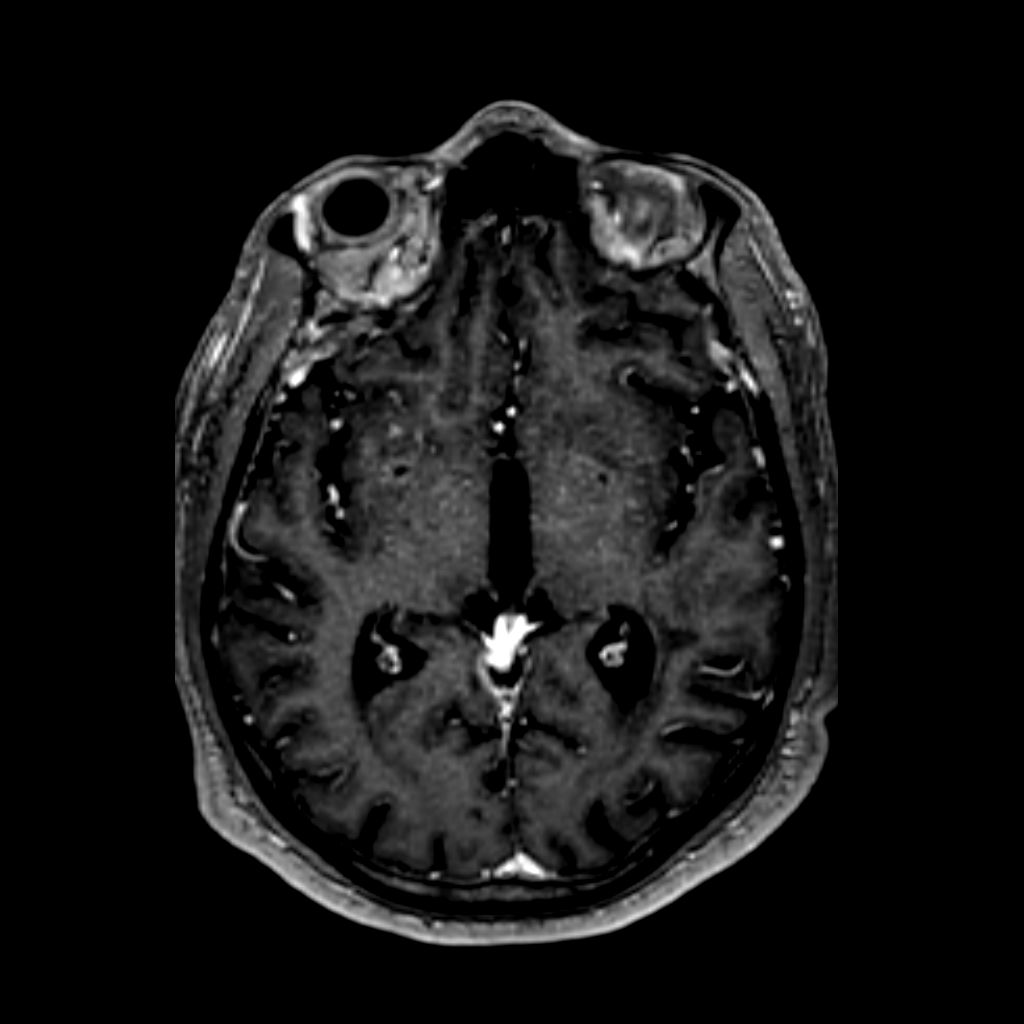

[5 of 48 positions shown; findings below may reference images not displayed]

FINDINGS: -------------------------------------------------------------------------------- 
------------------------- 
INTRACRANIAL: 
No acute ischemia. No abnormal foci of susceptibility artifact in the brain. 
Patency of intracranial vascular flow voids. Periventricular and deep white 
matter change, probably secondary to microangiopathy. This is moderate to 
extensive.  Stable focal encephalomalacia, linear, along the right centrum 
semiovale from chronic ischemic change, posteriorly. Stable focus of chronic 
ischemic change along the left periatrial white matter. No acute hemorrhage, 
midline shift, mass effect.  No pathologic enhancement in the brain. 
-------------------------------------------------------------------------------- 
----------------------- 
OTHER: 
ORBITS/SINUSES/T-BONES:  Status post bilateral cataract extraction.  Mastoid air 
cells and middle ear cavities are grossly clear.  Visualized paranasal sinuses 
are clear. 
MARROW SIGNAL/SOFT TISSUES: No focal suspect signal abnormality.  
-------------------------------------------------------------------------------- 
-------------------
IMPRESSION: 1.  No acute intracranial abnormality or pathologic enhancement. 
2.  Stable chronic ischemic and white matter microangiopathic change.

## 2023-03-19 IMAGING — CT CT CHEST/ABDOMEN AND PELVIS WITH IV AND ORAL CONTRAST
2 of 7 series · 15 of 46 positions shown, 17 images · IV contrast (APPLIED)
Comparison: CT 11/21/2022.

________________________________________________________________________________________________ 
CT CHEST/ABDOMEN AND PELVIS WITH IV AND ORAL CONTRAST, 03/19/2023 [DATE]: 
CLINICAL INDICATION: Malignant melanoma of skin, unspecified 
A search for DICOM formatted images was conducted for prior CT imaging studies 
completed at a non-affiliated media free facility.
TECHNIQUE: The chest, abdomen and pelvis were scanned from neck through the 
pubic rami with 75 mL of Isovue 300 MDV contrast on a high-resolution CT scanner 
using dose reduction techniques. Patient drank diluted 2 mL of Gastrografin. 28 
mL of Gastrografin 30 ml discarded. Routine MPR reconstructions were performed. 
The patients eGFR was calculated to be 65.1 mL/min/1.73 m2 using the i-STAT 
device.

[Series 7: coronal · coronal · 0.68mm/px · 3 of 157 slices shown]
[im 40/157  soft-tissue]
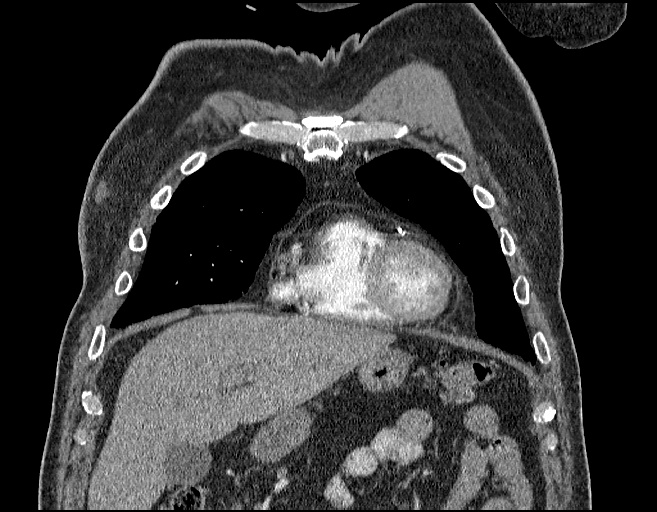
[im 79/157  soft-tissue]
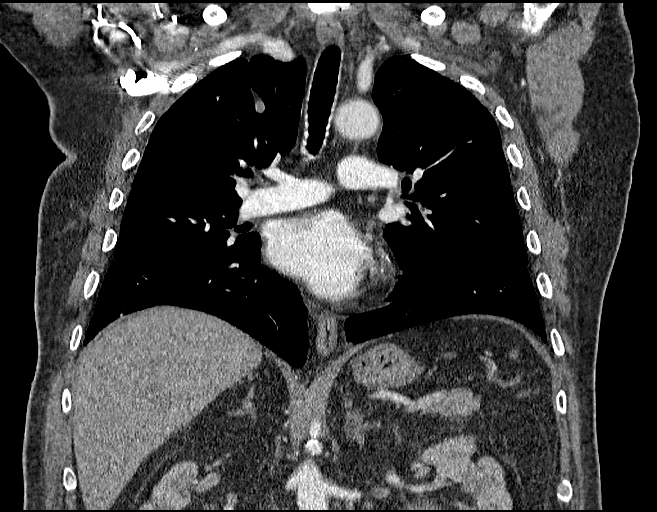
[im 118/157  soft-tissue]
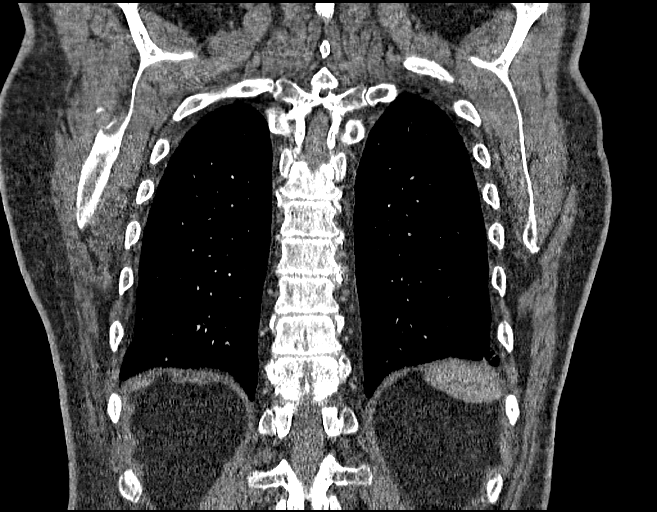

[Series 9: portal 3.0 i41s 2 · axial · portal-venous · 0.90mm/px · z∈[-759,-300]mm · 12 of 179 slices shown, 14 images]
[im 13/179  soft-tissue]
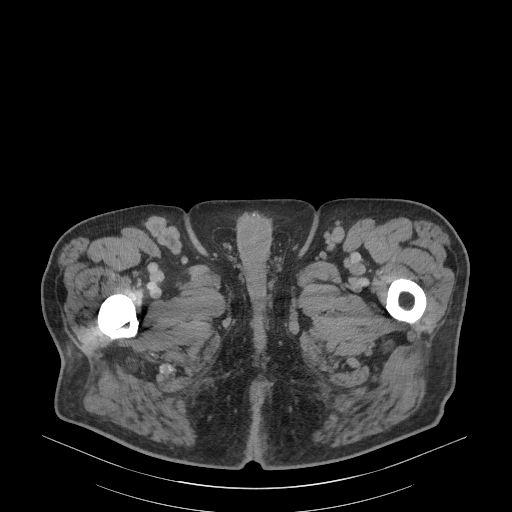
[im 13/179  bone]
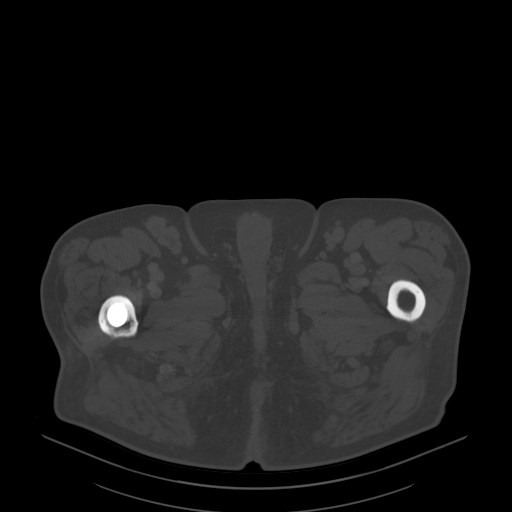
[im 26/179  soft-tissue]
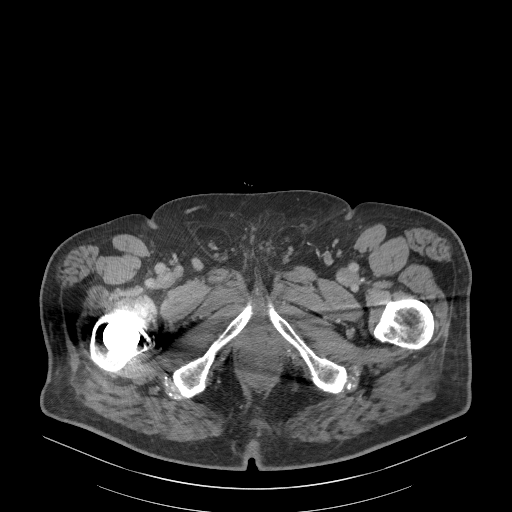
[im 39/179  soft-tissue]
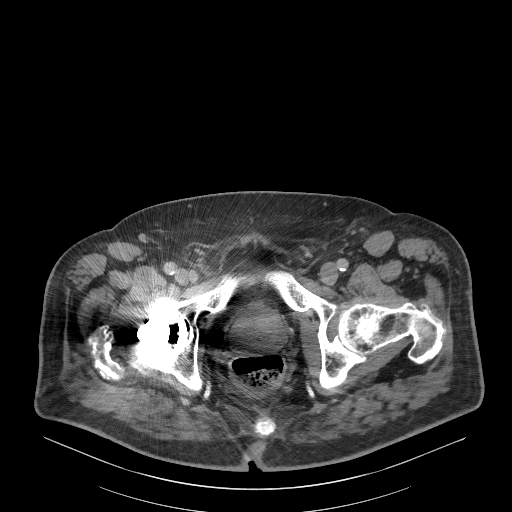
[im 51/179  soft-tissue]
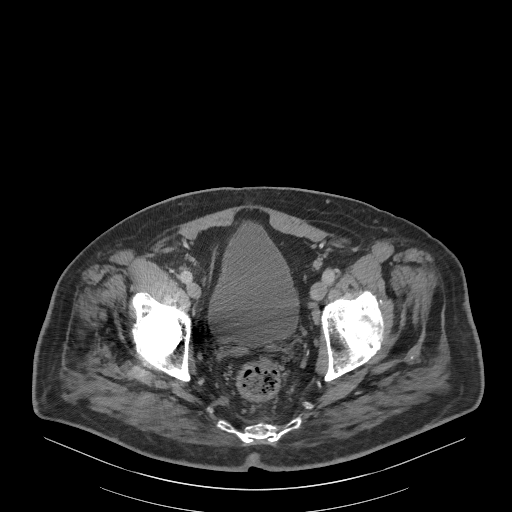
[im 64/179  soft-tissue]
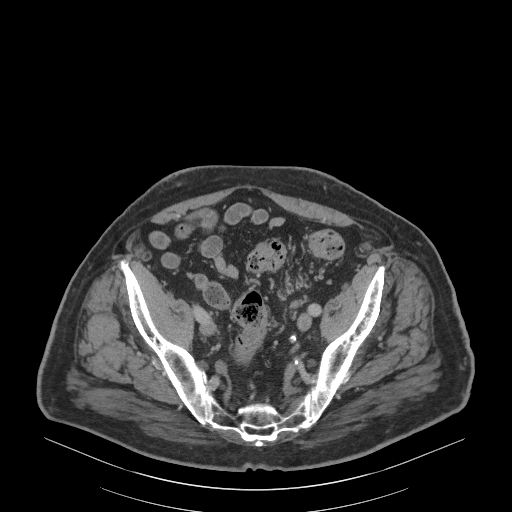
[im 77/179  soft-tissue]
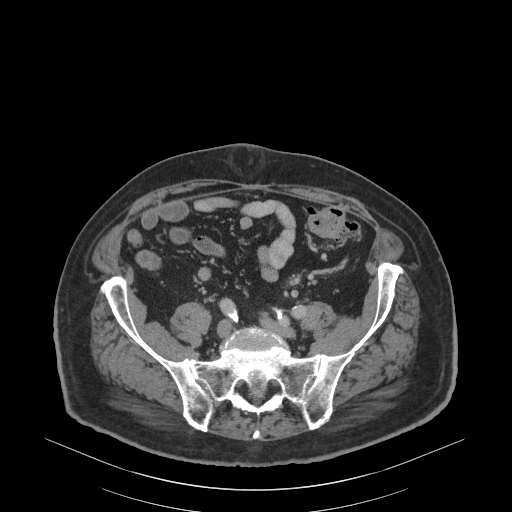
[im 102/179  soft-tissue]
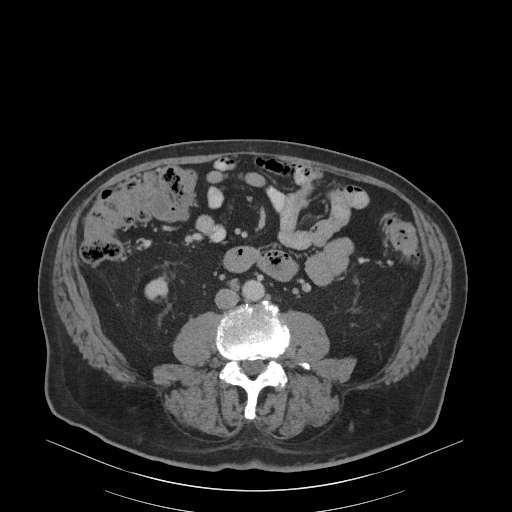
[im 115/179  soft-tissue]
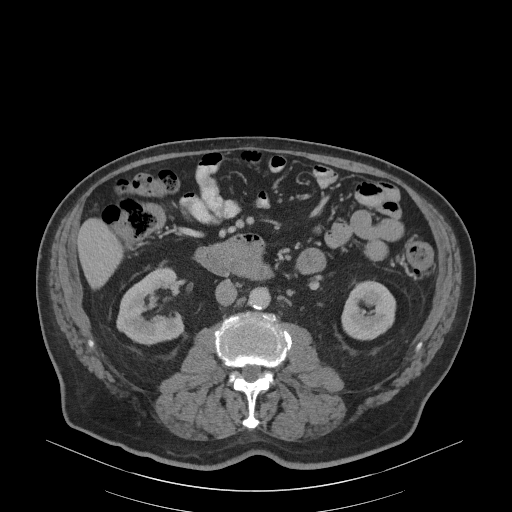
[im 128/179  soft-tissue]
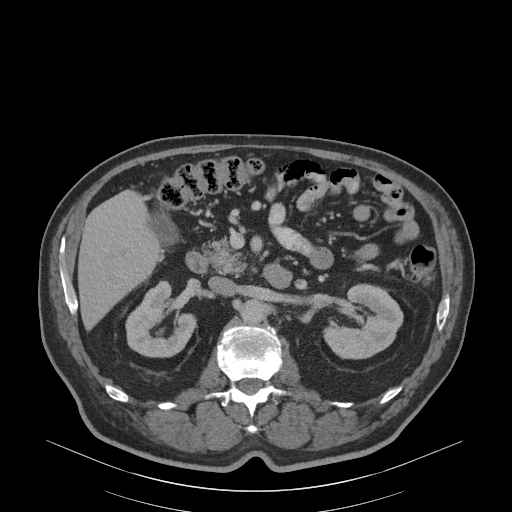
[im 128/179  bone]
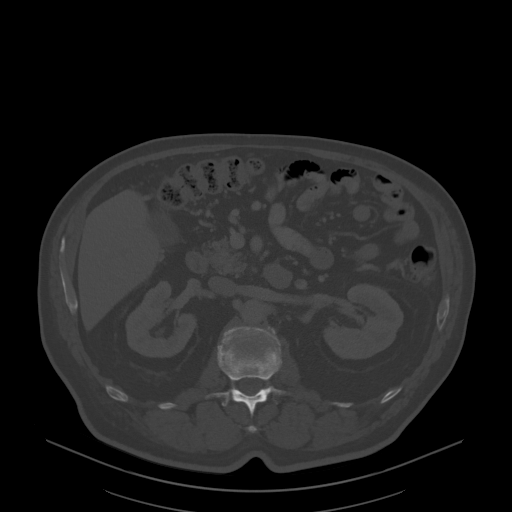
[im 140/179  soft-tissue]
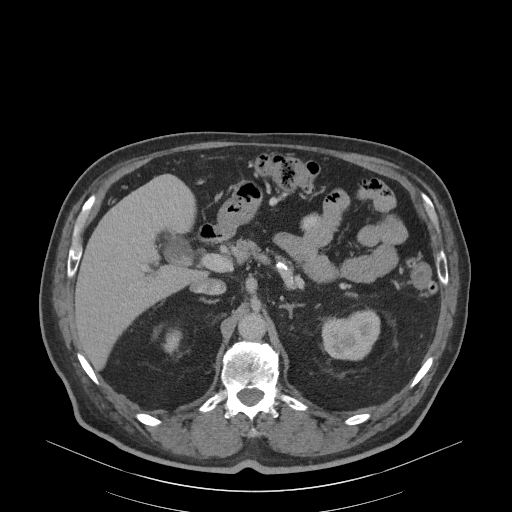
[im 153/179  soft-tissue]
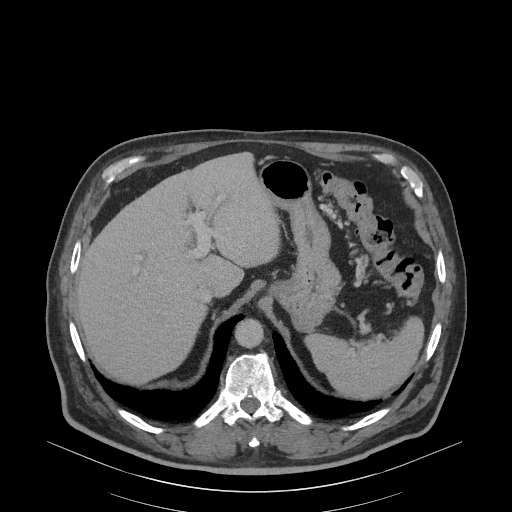
[im 166/179  soft-tissue]
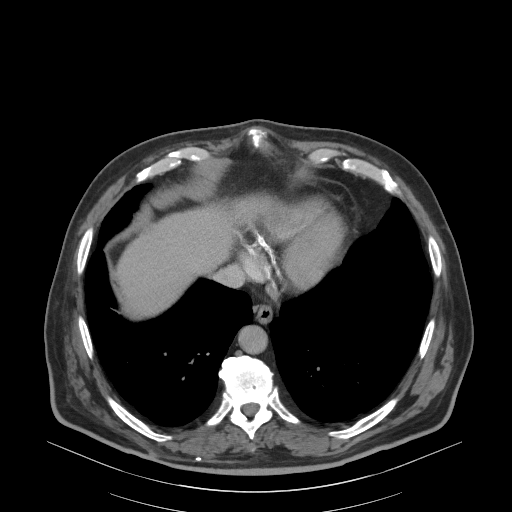

[15 of 46 positions shown; findings below may reference images not displayed]

Count of known CT and Cardiac Nuclear Medicine studies performed in the previous 
12 months = 4.
FINDINGS: LUNGS AND PLEURA:  Further improvement in the waxing and waning groundglass 
nodules with some residual noncalcified micronodules but no new suspicious 
pulmonary nodules. No new suspicious pulmonary nodule. No acute consolidation or 
effusion.  
MEDIASTINUM:  No adenopathy. Normal heart size. No pericardial effusion. 
Moderate severe coronary artery calcification. 
CHEST WALL/AXILLA: No mass or adenopathy.  
HEPATOBILIARY: No evidence for mass or biliary dilatation. Cholelithiasis but no 
gallbladder wall thickening or pericholecystic fluid. 
SPLEEN: Normal in size. 
PANCREAS: No ductal dilatation or mass.   
ADRENALS: No mass. 
GENITOURINARY: Bilateral nonobstructing calculi with largest calculus inferior 
pole of the right kidney measuring up to 5 mm .No enhancing mass or 
hydronephrosis. No bladder mass. 
LYMPH NODES: No adenopathy. 
STOMACH, SMALL BOWEL AND COLON: No bowel wall thickening or obstruction. Stomach 
is decompressed and difficult to assess. Umbilical hernia containing fat. There 
is diverticulosis of the descending and sigmoid colon without active 
diverticulitis. 
VASCULAR STRUCTURES: No aneurysm.  
MUSCULOSKELETAL: No acute osseous abnormality. Scattered degenerative changes.  
ADDITIONAL FINDINGS: Right hip prosthesis.
IMPRESSION: Further resolution of the groundglass nodule densities with some residual 
noncalcified micronodules but no new suspicious pulmonary nodules. 
No new thoracic, abdominal or pelvic adenopathy. 
Cholelithiasis. 
Small nonobstructing bilateral renal calculi. 
In patients between the ages of 50-77 where pulmonary emphysema is noted on CT, 
recommend evaluation for low dose lung cancer screening protocol if patient is 
not already enrolled; as pulmonary emphysema is an independent risk factor for 
lung cancer. 
RADIATION DOSE REDUCTION: All CT scans are performed using radiation dose 
reduction techniques, when applicable.  Technical factors are evaluated and 
adjusted to ensure appropriate moderation of exposure.  Automated dose 
management technology is applied to adjust the radiation doses to minimize 
exposure while achieving diagnostic quality images.

## 2023-07-06 IMAGING — CT CT CHEST/ABDOMEN AND PELVIS WITH IV AND ORAL CONTRAST
2 of 7 series · 15 of 46 positions shown, 17 images · IV contrast (APPLIED)
Comparison: CT 03/19/2023.

________________________________________________________________________________________________ 
CT CHEST/ABDOMEN AND PELVIS WITH IV AND ORAL CONTRAST, 07/06/2023 [DATE]: 
CLINICAL INDICATION: Malignant melanoma of skin, unspecified 
A search for DICOM formatted images was conducted for prior CT imaging studies 
completed at a non-affiliated media free facility.
TECHNIQUE: The chest, abdomen and pelvis were scanned from neck through the 
pubic rami with 75 mL of Isovue 300 MDV contrast on a high-resolution CT scanner 
using dose reduction techniques. Patient drank diluted 2 mL of Gastrografin. 28 
mL of Gastrografin 30 ml discarded. Routine MPR reconstructions were performed.

[Series 7: coronal · coronal · 0.68mm/px · 3 of 167 slices shown]
[im 42/167  soft-tissue]
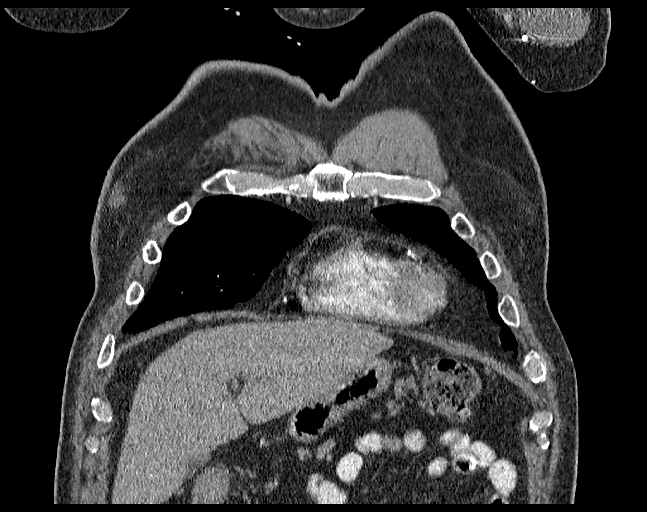
[im 84/167  soft-tissue]
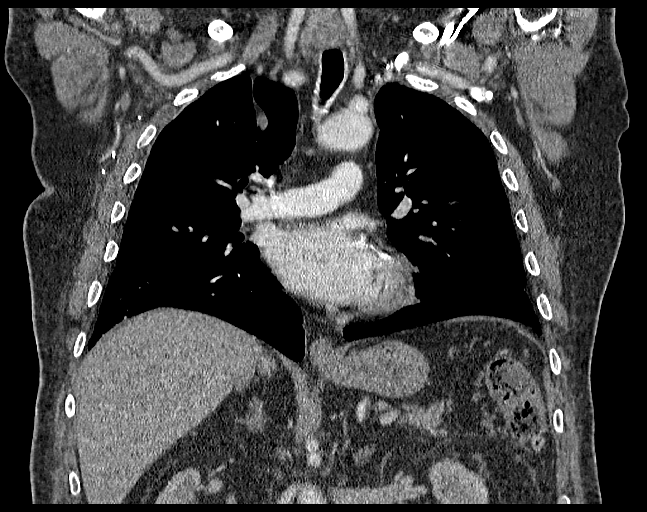
[im 125/167  soft-tissue]
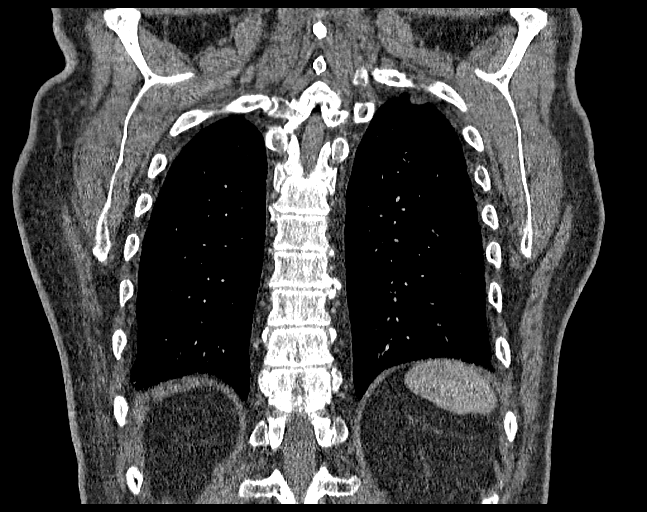

[Series 9: portal 3.0 i41s 2 · axial · portal-venous · 0.87mm/px · z∈[-719,-245]mm · 12 of 186 slices shown, 14 images]
[im 14/186  soft-tissue]
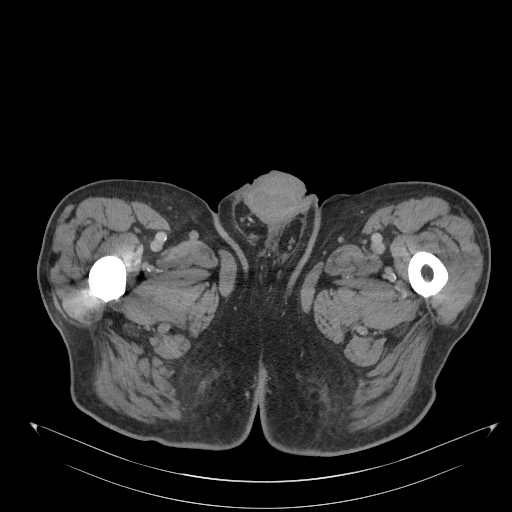
[im 14/186  bone]
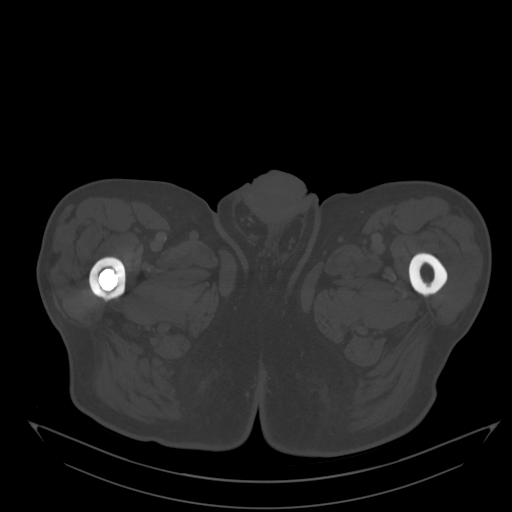
[im 27/186  soft-tissue]
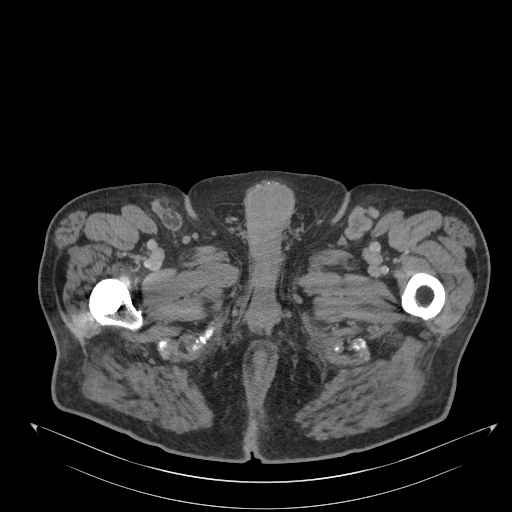
[im 40/186  soft-tissue]
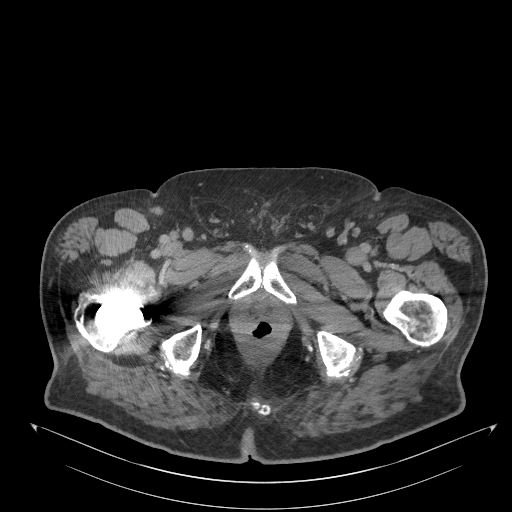
[im 53/186  soft-tissue]
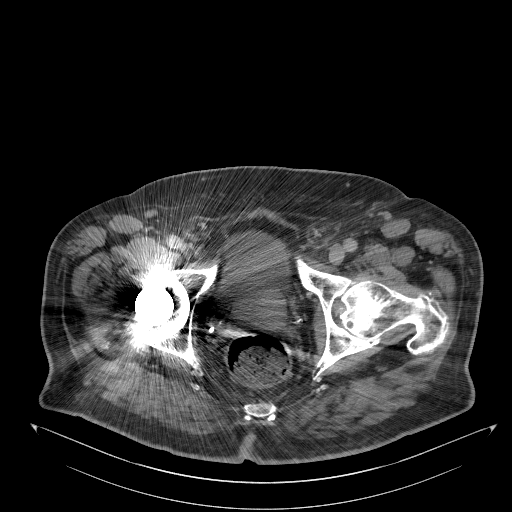
[im 67/186  soft-tissue]
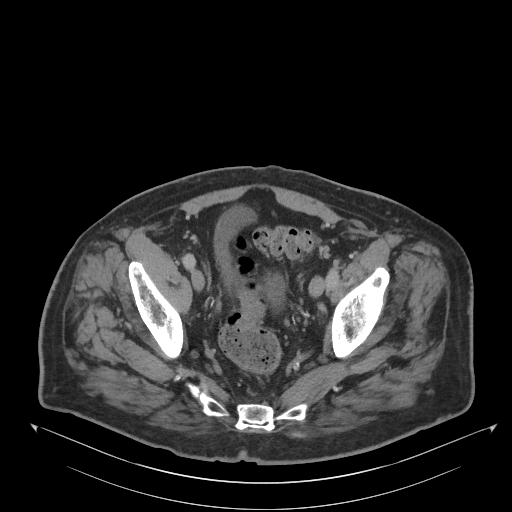
[im 80/186  soft-tissue]
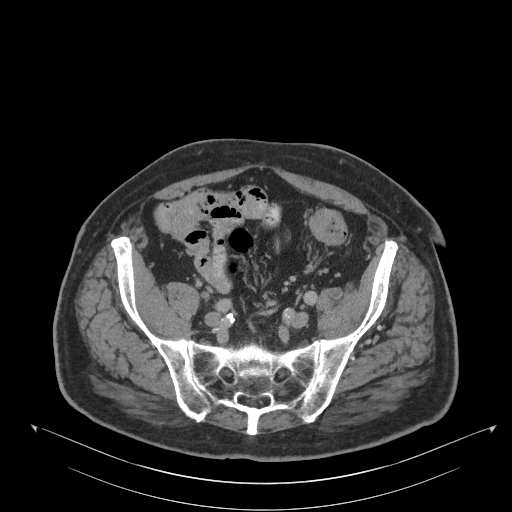
[im 106/186  soft-tissue]
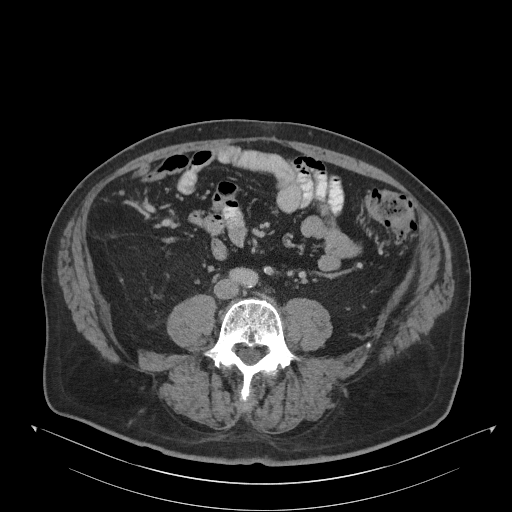
[im 119/186  soft-tissue]
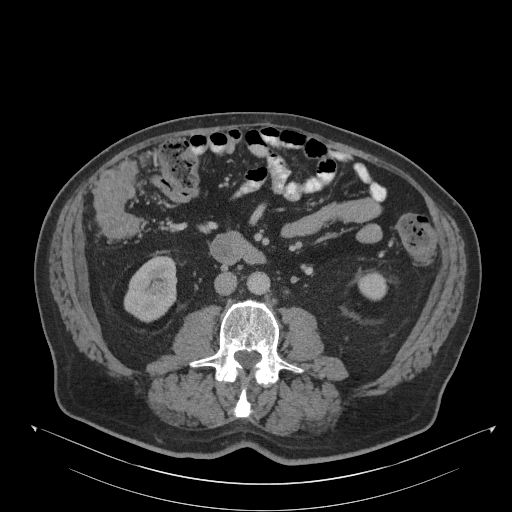
[im 133/186  soft-tissue]
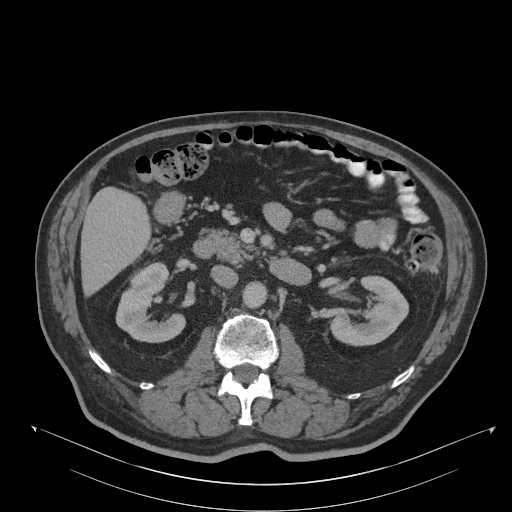
[im 133/186  bone]
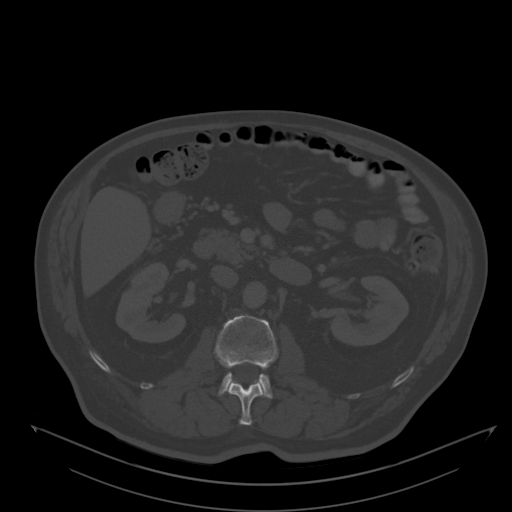
[im 146/186  soft-tissue]
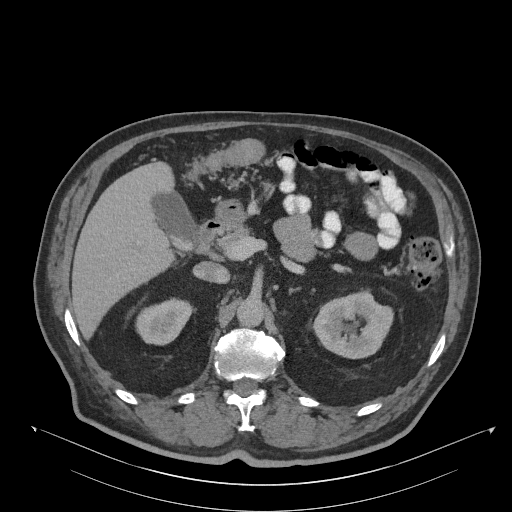
[im 159/186  soft-tissue]
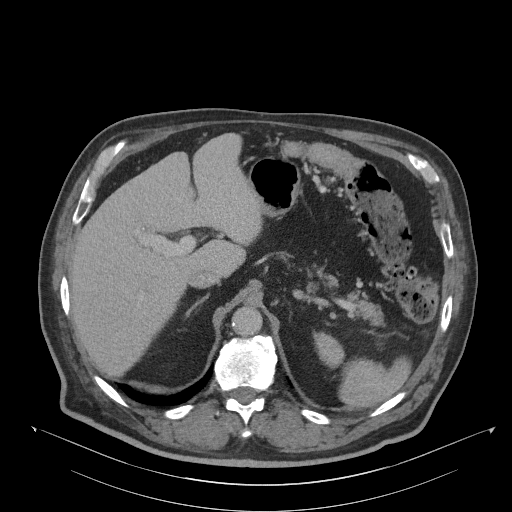
[im 172/186  soft-tissue]
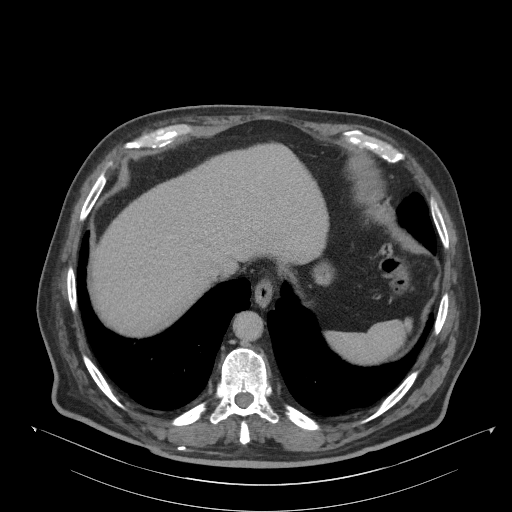

[15 of 46 positions shown; findings below may reference images not displayed]

Count of known CT and Cardiac Nuclear Medicine studies performed in the previous 
12 months = 6.
FINDINGS: LUNGS AND PLEURA:  Peribronchial thickening with scattered atelectasis. Stable 
micronodules. No new suspicious pulmonary nodules. No acute consolidation or 
effusion.  
MEDIASTINUM:  No adenopathy. Normal heart size. No pericardial effusion. 
Moderate severe coronary artery calcification. 
CHEST WALL/AXILLA: No mass or adenopathy.  
HEPATOBILIARY: No evidence for mass or biliary dilatation. Calcified granuloma 
Cholelithiasis but no gallbladder wall thickening or pericholecystic fluid. 
SPLEEN: Normal in size. 
PANCREAS: No ductal dilatation or mass.   
ADRENALS: No mass. 
GENITOURINARY: Bilateral nonobstructing calculi with largest calculus inferior 
pole of the right kidney measuring up to 5 mm .No enhancing mass or 
hydronephrosis. No bladder mass. 
LYMPH NODES: No adenopathy. 
STOMACH, SMALL BOWEL AND COLON: No bowel wall thickening or obstruction. Stomach 
is decompressed and difficult to assess. Umbilical hernia containing fat. 
Colonic diverticula greatest in the descending colon and sigmoid colon with mild 
wall thickening but no pericolonic inflammatory process or collection. 
VASCULAR STRUCTURES: No aneurysm.  
MUSCULOSKELETAL: No acute osseous abnormality. Scattered degenerative changes. 
Right orthopedic hip prosthesis 
ADDITIONAL FINDINGS: Right hip prosthesis.
IMPRESSION: Stable noncalcified micronodules. No new suspicious pulmonary nodules.. 
No new thoracic, abdominal or pelvic adenopathy. 
Cholelithiasis. 
Small nonobstructing bilateral renal calculi. 
In patients between the ages of 50-77 where pulmonary emphysema is noted on CT, 
recommend evaluation for low dose lung cancer screening protocol if patient is 
not already enrolled; as pulmonary emphysema is an independent risk factor for 
lung cancer. 
RADIATION DOSE REDUCTION: All CT scans are performed using radiation dose 
reduction techniques, when applicable.  Technical factors are evaluated and 
adjusted to ensure appropriate moderation of exposure.  Automated dose 
management technology is applied to adjust the radiation doses to minimize 
exposure while achieving diagnostic quality images.

## 2023-10-11 ENCOUNTER — Encounter: Admit: 2023-10-11 | Payer: PRIVATE HEALTH INSURANCE | Attending: Vascular and Interventional Radiology

## 2024-01-10 NOTE — Progress Notes
 [facility] [id number] DATA CLARIFICATION NOTEPlease note that all ongoing adverse events are stable at this timeConcomitant medications: MEDICATION SINGLE DOSE UNITS ROUTE FREQUECY START DATE STOP DATE INDICATION Aspirin 81 mg PO QD 2010 ongoing PPX CVD Simvastatin 20 Mg PO QD 2012 ongoing Hypercholesterolemia  Ambien 10 mg PO QD 2016 ongoing insomnia  Glucoside 10 mg PO QD 2014 ongoing diabetes  Toujeo Solostar  80 Units SQ QD  2016  07/08/19 diabetes  guaifenesin  400 mg PO q 4 hours prn 07/10/19 08/16/19 cough  imodium  2 Mg PO PRN 07/19/19 ongoing diarrhea Toujeo Solostar 40-50 units subcutaneous QD 07/09/19 10/01/19 diabetes  Toujeo Solostar 60 units SQ QD 10/02/19  01/14/20 diabetes Cefalexin 250 Mg Oral TID 12/24/19 01/02/20 PPX Infection  Moderna COVID Booster 1  Injection IM Once 01/10/20 01/10/20 PPX COVID-19  Toujeo Solostar  70 Units subcutaneous  QD  01/15/20 03/15/20 Diabetes Lispro 5 Units SQ Once 02/03/20 02/03/20 diabetes  Toujeo Solostar  80 Units subcutaneous  QD  03/16/20 04/26/20 Diabetes Areds Preservision 1 Tab PO BID 04/27/20 Ongoing Vision supplement  Toujeo Solostar  70 Units subcutaneous  QD  01/15/20 03/15/20 Diabetes Triamcinolone  creme 0.1% 1 application topical PRN 05/25/20 ongoing rash Tylenol PM 1 Tab PO PRN 07/09/19 ongoing Cough/sleep aid Moderna COVID Vaccine #1 1 dose IM once 07/22/19 07/22/19 ppx COVID 19 Moderna COVID vaccine #2 1 Dose  IM Once 08/30/19 08/30/19 ppx COVID 19           Medical History:                                                         PATIENT NAME: Jason Rowland  STUDY ID:  [id number]     0=NA                      1= Unrelated         2= Unlikely 3=Possible         4= Probable           5. Definite (Circle One) 0= No Action Taken                                         1= Con Med                                                            2=Dose Modified                                        3=Dose Delayed 4= Patient Hospitalized                                      5= Patient Taken Off Study                    6=Non Drug Therapy  7=Other (Specify) 1=Recovered                       2=Still under txmt/observation                              3=Alive w/Sequelae                                                                 4=Died                         5=Resolvd to Baseline             6=Grade Change       Medical History                       ADVERSE EVENT  Is AE Intermittent? Y/N SAE             Y/N Grade Expect PEMBRO Y/N ?Relation to [facility] Expect mRNA            Y/N ?Relation to mRNA Start Date End Date/ or ?Ongoing Action Taken               Designer, jewellery # above) AE OUTCOME   Hypercholesterolemia N N 1 Med Hx Med Hx Med Hx Med Hx 2012 ongoing 1= simvastatin 2   diabetes N N 1 Med Hx Med Hx Med Hx Med Hx 2010 ongoing 1- glucoside; toujeo 2   Insomnia N N 1 Med Hx Med Hx Med Hx Med Hx 2016 ongoing 1- ambien 2   Edema limbs N N 1 Med Hx  Med Hx Med Hx Med Hx  03/03/2019 ongoing 0 2   Diarrhea  N N 1 Y  1  N/A  N/A  05/27/2019 05/27/2019 0 5   fatigue   N N  1   Y 3   Y 3   06/26/19  ongoing  0 2    dysgeusia N N 1 N 2 N 2 06/22/19 07/30/19 0 2   anorexia N N 1 Y 2 Y 2 06/22/19 08/22/19 0 2   dysgeusia N N 2 N 2 N 2 07/31/19 08/22/19    Pain, injection site Y N 1 N 1 Y 4 06/19/19 06/23/19 0 2   Eye disorders, other (left eye droop) N N 1 N 2 N 2 07/01/19 07/31/19 0 2   vomiting N N 1 Y 3 Y 3 07/08/19 07/08/19 0 1   dirrahea Y N 1 Y 3 Y 3 07/08/19 07/08/19 0 2   cough N N 1 Y 3 Y 3 07/08/19 08/16/19 1 = gueifenisen 2   ALT increased N N 1 Y 3 Y 3 07/10/19 07/31/19 0 2   AST increased  N N 1 Y 3 Y 3 07/10/19 07/31/19 0 2   Alkaline phos increased N N 2 Y 3 Y 3 07/10/19 07/31/19 0 2   Diarrhea  Y N 2 Y 3 Y 3 07/09/19 07/20/2019 1- imodium  1   Injection site pain Y N 1 N 1 Y 3 07/08/19 12/25/19 0 2   bloating Y N 1 N 1 N 1  07/08/19 ongoing 0 2   Bilateral foot neuropathy N N 1 N/A N/A N/A N/A Baseline 02/2019 ongoing 0 2   Dyspnea on exertion Y N 1 N/A N/A N/A N/A Baseline 02/2019 ongoing 0 2   Diarrhea  N N 1 Y 1 Y 1 09/25/19  09/25/19 1- imodium  1   Dysgeusia N N 1 Y 2 Y 2 10/23/19 11/13/19 0   Anorexia  N N 1 Y 2 Y 2 10/02/19  11/13/19 0 2   Diarrhea  N N 1 Y 1 Y 1 10/16/19  10/17/19 0 1   diarrhea  N N 1 Y 1 Y 1 11/14/19  11/15/19 0 1   diarrhea  N N 1 Y 1 Y 1 01/10/20  01/12/20 0 1   diarrhea  N N 1 Y 1 Y 1 03/15/20  03/15/20 0 1   Maculopapular rash bilateral arms   N N 1 Y 0 Y 0 05/18/20 ongoing I triamcinolone  1

## 2024-03-27 ENCOUNTER — Encounter: Admit: 2024-03-27 | Payer: PRIVATE HEALTH INSURANCE | Attending: Vascular and Interventional Radiology
